# Patient Record
Sex: Female | Born: 1935 | Race: White | Hispanic: No | Smoking: Never smoker
Health system: Southern US, Community
[De-identification: ages and names within clinical notes are randomized; demographics above are authoritative.]

## PROBLEM LIST (undated history)

## (undated) DIAGNOSIS — M792 Neuralgia and neuritis, unspecified: Secondary | ICD-10-CM

## (undated) DIAGNOSIS — J449 Chronic obstructive pulmonary disease, unspecified: Secondary | ICD-10-CM

## (undated) DIAGNOSIS — J45909 Unspecified asthma, uncomplicated: Secondary | ICD-10-CM

## (undated) DIAGNOSIS — I10 Essential (primary) hypertension: Secondary | ICD-10-CM

## (undated) DIAGNOSIS — B029 Zoster without complications: Secondary | ICD-10-CM

## (undated) HISTORY — PX: LEG SURGERY: SHX1003

---

## 2001-09-16 ENCOUNTER — Encounter: Admission: RE | Admit: 2001-09-16 | Discharge: 2001-09-16 | Payer: Self-pay | Admitting: Gastroenterology

## 2001-09-16 ENCOUNTER — Encounter: Payer: Self-pay | Admitting: Gastroenterology

## 2002-08-09 ENCOUNTER — Encounter: Payer: Self-pay | Admitting: Gastroenterology

## 2002-08-09 ENCOUNTER — Ambulatory Visit (HOSPITAL_COMMUNITY): Admission: RE | Admit: 2002-08-09 | Discharge: 2002-08-09 | Payer: Self-pay | Admitting: Gastroenterology

## 2002-11-29 ENCOUNTER — Encounter: Admission: RE | Admit: 2002-11-29 | Discharge: 2002-11-29 | Payer: Self-pay | Admitting: Gastroenterology

## 2002-11-29 ENCOUNTER — Encounter: Payer: Self-pay | Admitting: Gastroenterology

## 2002-12-01 ENCOUNTER — Encounter: Payer: Self-pay | Admitting: Gastroenterology

## 2002-12-01 ENCOUNTER — Encounter: Admission: RE | Admit: 2002-12-01 | Discharge: 2002-12-01 | Payer: Self-pay | Admitting: Gastroenterology

## 2005-08-04 ENCOUNTER — Inpatient Hospital Stay: Payer: Self-pay | Admitting: Internal Medicine

## 2006-12-16 ENCOUNTER — Inpatient Hospital Stay: Payer: Self-pay | Admitting: Specialist

## 2006-12-16 ENCOUNTER — Other Ambulatory Visit: Payer: Self-pay

## 2006-12-17 ENCOUNTER — Other Ambulatory Visit: Payer: Self-pay

## 2008-04-03 ENCOUNTER — Emergency Department (HOSPITAL_COMMUNITY): Admission: EM | Admit: 2008-04-03 | Discharge: 2008-04-03 | Payer: Self-pay | Admitting: Emergency Medicine

## 2008-04-15 ENCOUNTER — Inpatient Hospital Stay (HOSPITAL_COMMUNITY): Admission: EM | Admit: 2008-04-15 | Discharge: 2008-04-21 | Payer: Self-pay | Admitting: Emergency Medicine

## 2008-04-15 ENCOUNTER — Ambulatory Visit: Payer: Self-pay | Admitting: Internal Medicine

## 2008-04-26 ENCOUNTER — Ambulatory Visit: Payer: Self-pay | Admitting: *Deleted

## 2008-04-26 ENCOUNTER — Encounter: Payer: Self-pay | Admitting: Internal Medicine

## 2008-04-26 ENCOUNTER — Encounter (INDEPENDENT_AMBULATORY_CARE_PROVIDER_SITE_OTHER): Payer: Self-pay | Admitting: *Deleted

## 2008-04-26 DIAGNOSIS — B029 Zoster without complications: Secondary | ICD-10-CM | POA: Insufficient documentation

## 2008-04-26 DIAGNOSIS — K5900 Constipation, unspecified: Secondary | ICD-10-CM | POA: Insufficient documentation

## 2008-04-26 DIAGNOSIS — R609 Edema, unspecified: Secondary | ICD-10-CM | POA: Insufficient documentation

## 2008-04-26 DIAGNOSIS — E538 Deficiency of other specified B group vitamins: Secondary | ICD-10-CM

## 2008-04-26 LAB — CONVERTED CEMR LAB
ALT: 17 units/L (ref 0–35)
AST: 22 units/L (ref 0–37)
Albumin: 3.8 g/dL (ref 3.5–5.2)
Basophils Absolute: 0 10*3/uL (ref 0.0–0.1)
Basophils Relative: 1 % (ref 0–1)
CO2: 28 meq/L (ref 19–32)
Calcium: 9.3 mg/dL (ref 8.4–10.5)
Chloride: 102 meq/L (ref 96–112)
Creatinine, Urine: 102.2 mg/dL
Hemoglobin: 12.1 g/dL (ref 12.0–15.0)
Lymphocytes Relative: 34 % (ref 12–46)
MCHC: 34.4 g/dL (ref 30.0–36.0)
Microalb Creat Ratio: 6.4 mg/g (ref 0.0–30.0)
Monocytes Absolute: 0.4 10*3/uL (ref 0.1–1.0)
Neutro Abs: 1.6 10*3/uL — ABNORMAL LOW (ref 1.7–7.7)
Neutrophils Relative %: 47 % (ref 43–77)
Potassium: 4.2 meq/L (ref 3.5–5.3)
RDW: 17.9 % — ABNORMAL HIGH (ref 11.5–15.5)
Sodium: 135 meq/L (ref 135–145)
Total Protein: 6.6 g/dL (ref 6.0–8.3)

## 2008-04-27 LAB — CONVERTED CEMR LAB: Vitamin B-12: 371 pg/mL (ref 211–911)

## 2008-04-28 ENCOUNTER — Encounter (INDEPENDENT_AMBULATORY_CARE_PROVIDER_SITE_OTHER): Payer: Self-pay | Admitting: *Deleted

## 2008-04-28 ENCOUNTER — Telehealth (INDEPENDENT_AMBULATORY_CARE_PROVIDER_SITE_OTHER): Payer: Self-pay | Admitting: Internal Medicine

## 2008-05-03 ENCOUNTER — Encounter (INDEPENDENT_AMBULATORY_CARE_PROVIDER_SITE_OTHER): Payer: Self-pay | Admitting: Internal Medicine

## 2008-05-03 ENCOUNTER — Ambulatory Visit (HOSPITAL_COMMUNITY): Admission: RE | Admit: 2008-05-03 | Discharge: 2008-05-03 | Payer: Self-pay | Admitting: Internal Medicine

## 2008-05-09 ENCOUNTER — Telehealth (INDEPENDENT_AMBULATORY_CARE_PROVIDER_SITE_OTHER): Payer: Self-pay | Admitting: Internal Medicine

## 2008-05-09 ENCOUNTER — Ambulatory Visit: Payer: Self-pay | Admitting: Internal Medicine

## 2008-05-10 ENCOUNTER — Encounter (INDEPENDENT_AMBULATORY_CARE_PROVIDER_SITE_OTHER): Payer: Self-pay | Admitting: *Deleted

## 2008-05-12 ENCOUNTER — Ambulatory Visit: Payer: Self-pay | Admitting: Internal Medicine

## 2008-05-12 ENCOUNTER — Encounter (INDEPENDENT_AMBULATORY_CARE_PROVIDER_SITE_OTHER): Payer: Self-pay | Admitting: Internal Medicine

## 2008-05-12 DIAGNOSIS — B0229 Other postherpetic nervous system involvement: Secondary | ICD-10-CM

## 2008-05-12 DIAGNOSIS — R030 Elevated blood-pressure reading, without diagnosis of hypertension: Secondary | ICD-10-CM | POA: Insufficient documentation

## 2008-05-12 LAB — CONVERTED CEMR LAB
Basophils Absolute: 0 10*3/uL (ref 0.0–0.1)
Basophils Relative: 0 % (ref 0–1)
Eosinophils Relative: 6 % — ABNORMAL HIGH (ref 0–5)
HCT: 37.3 % (ref 36.0–46.0)
LDH: 170 units/L (ref 94–250)
Lymphocytes Relative: 23 % (ref 12–46)
Platelets: 176 10*3/uL (ref 150–400)
RDW: 16.4 % — ABNORMAL HIGH (ref 11.5–15.5)

## 2008-05-13 ENCOUNTER — Encounter (INDEPENDENT_AMBULATORY_CARE_PROVIDER_SITE_OTHER): Payer: Self-pay | Admitting: Internal Medicine

## 2008-05-16 ENCOUNTER — Inpatient Hospital Stay (HOSPITAL_COMMUNITY): Admission: EM | Admit: 2008-05-16 | Discharge: 2008-05-23 | Payer: Self-pay | Admitting: Emergency Medicine

## 2008-05-25 ENCOUNTER — Telehealth (INDEPENDENT_AMBULATORY_CARE_PROVIDER_SITE_OTHER): Payer: Self-pay | Admitting: Internal Medicine

## 2008-05-30 ENCOUNTER — Telehealth (INDEPENDENT_AMBULATORY_CARE_PROVIDER_SITE_OTHER): Payer: Self-pay | Admitting: Internal Medicine

## 2008-06-07 ENCOUNTER — Ambulatory Visit: Payer: Self-pay | Admitting: Internal Medicine

## 2009-03-31 IMAGING — CR DG CHEST 2V
2 series · 2 of 2 positions shown · non-contrast
Comparison: None

CLINICAL DATA: Chest pain.  Nausea.

CHEST - 2 VIEW

[w chest lat]
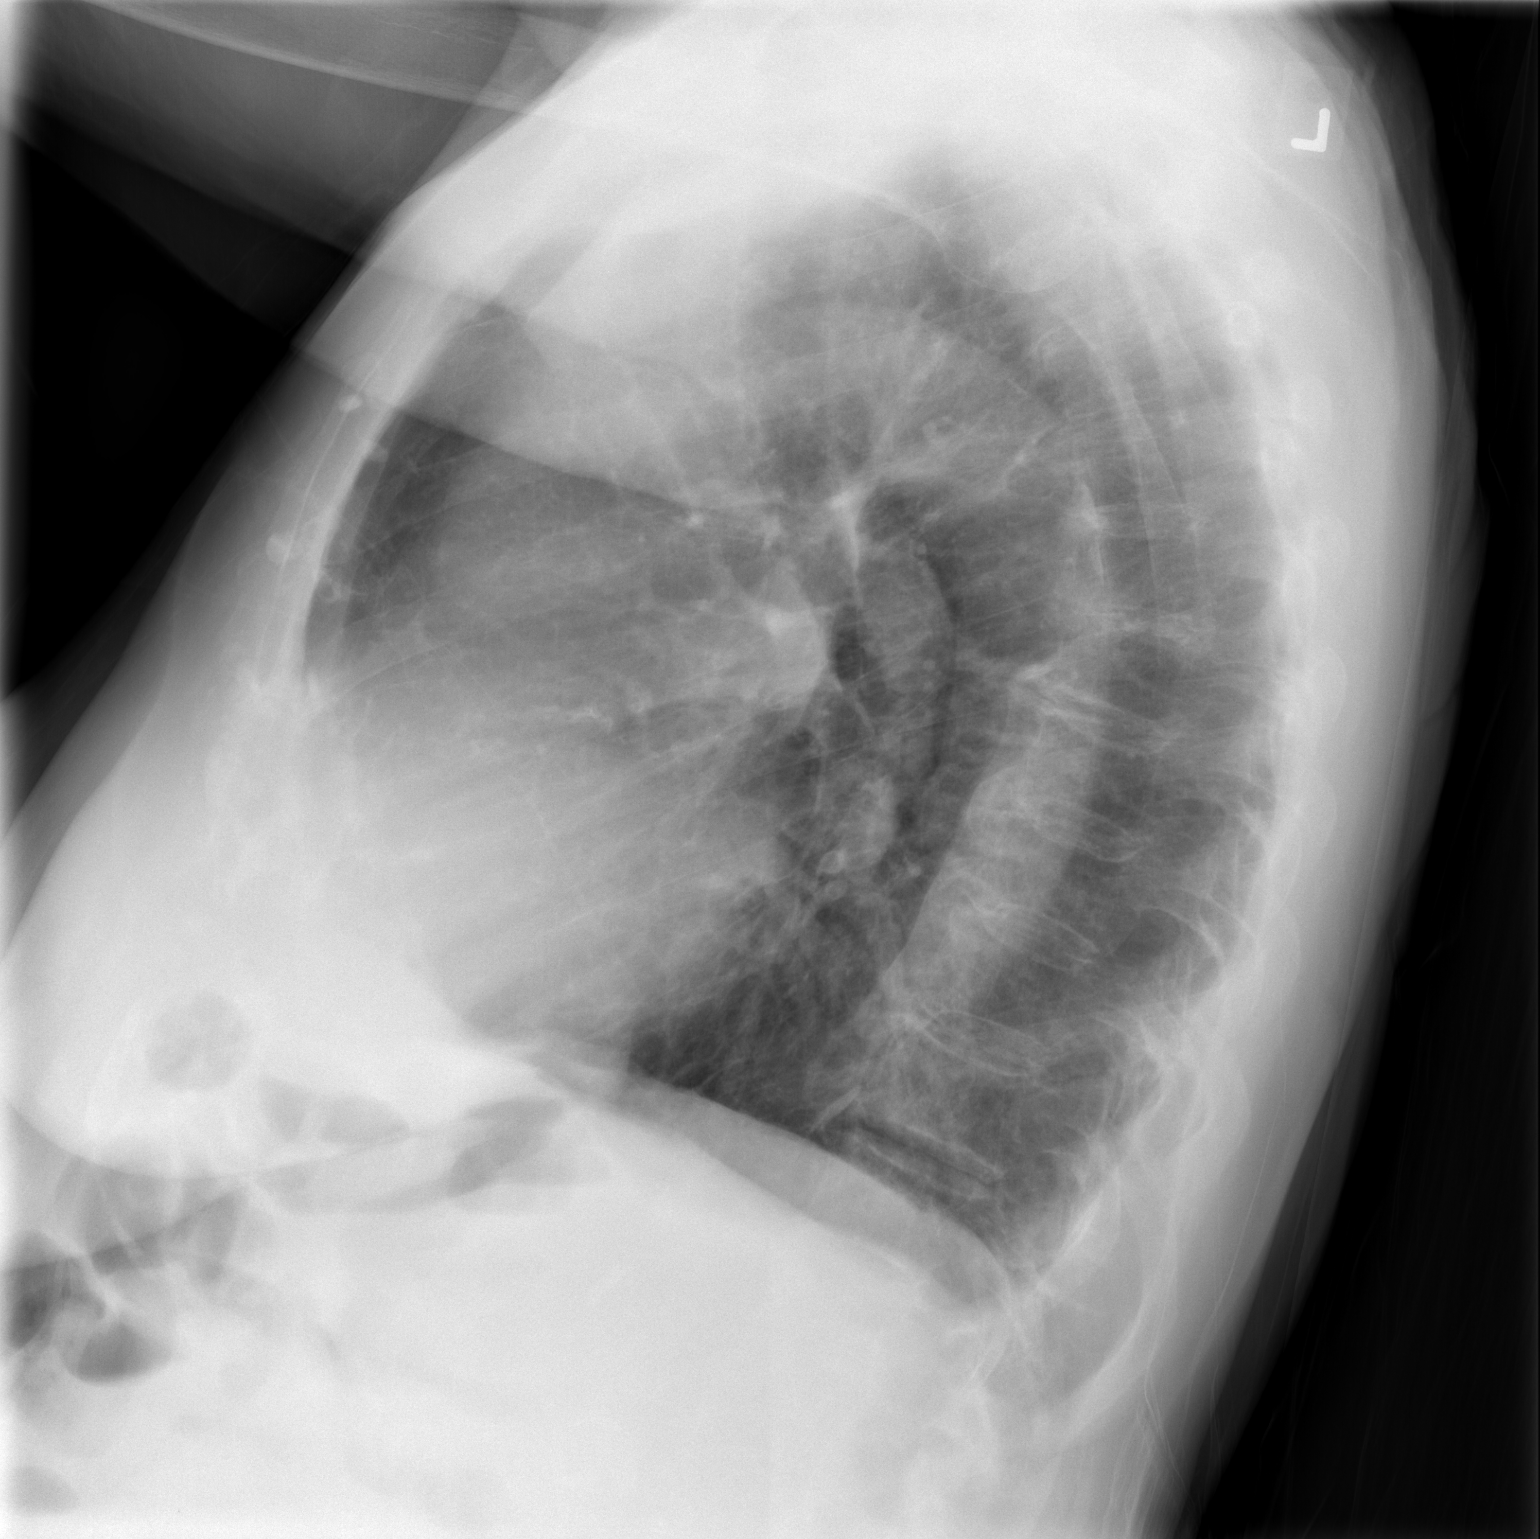

[view not recorded]
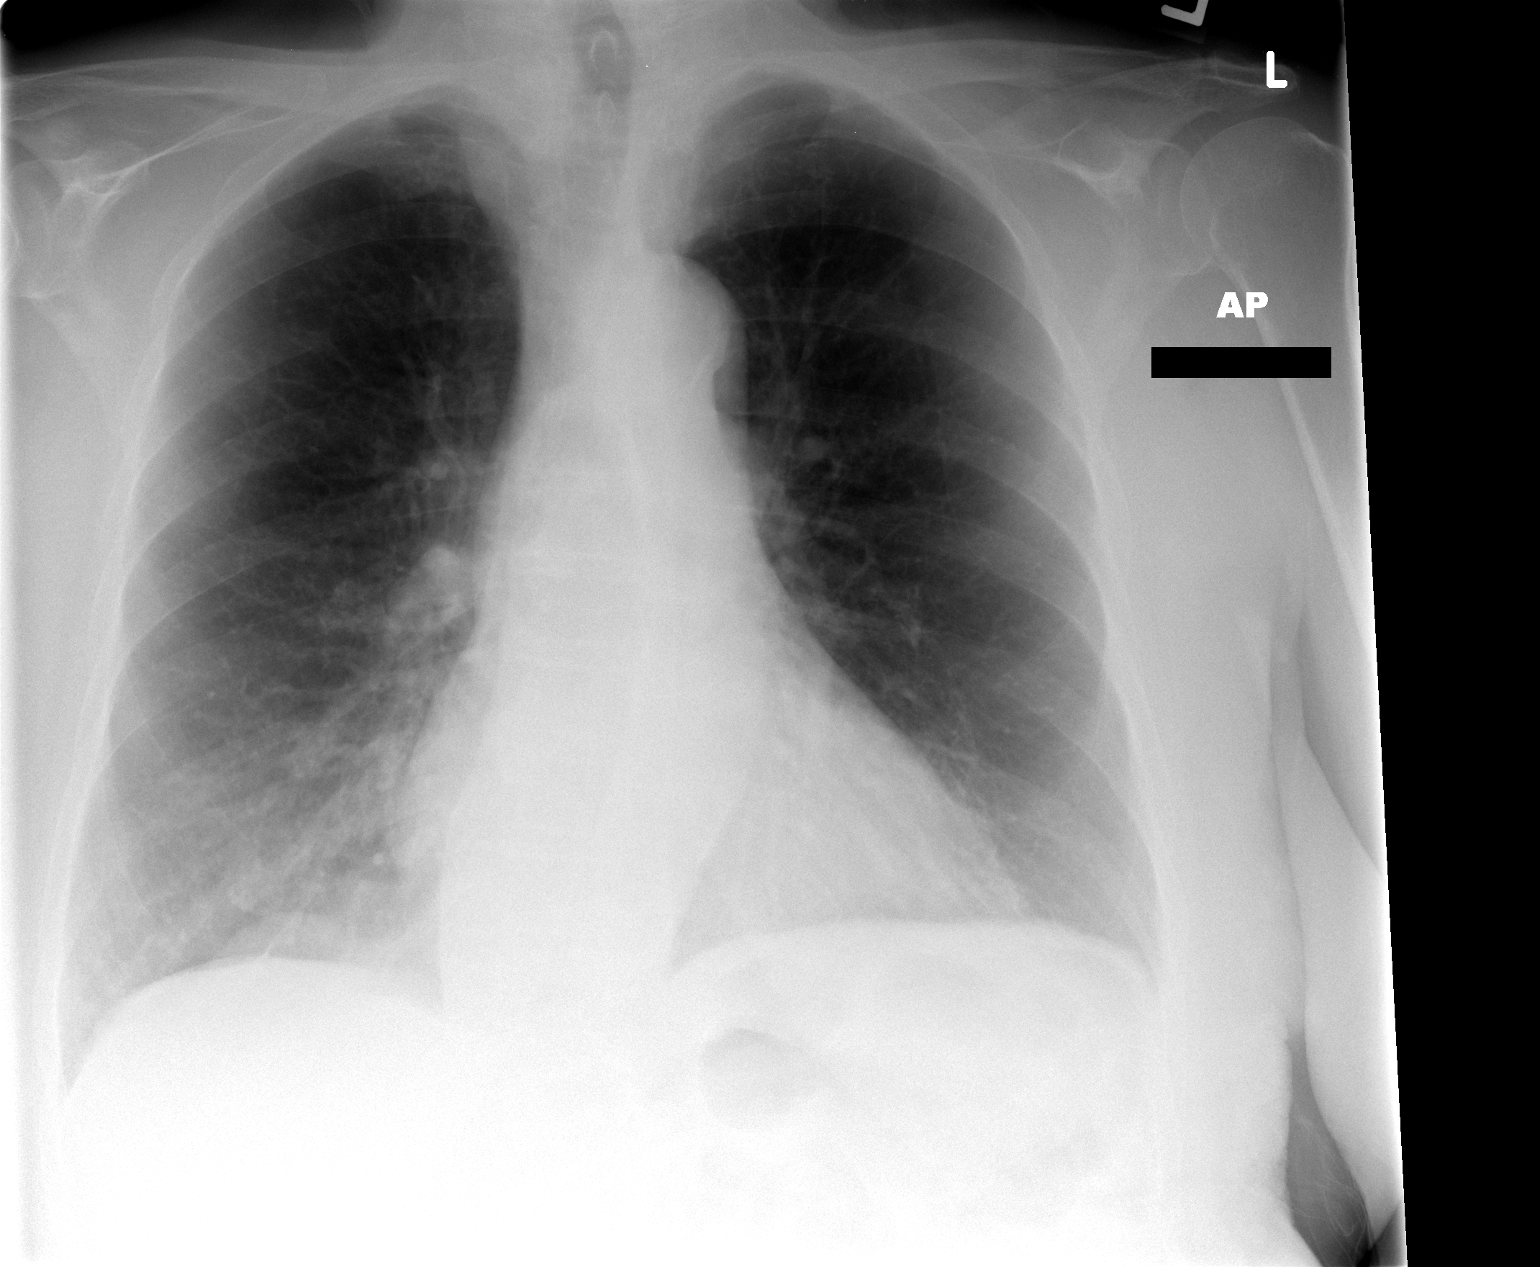

[2 of 2 positions shown; findings below may reference images not displayed]

FINDINGS: Heart size is normal.  The mediastinum is unremarkable
except for scoliosis.  No sign of infiltrate, mass, effusion or
collapse.  Bony structures show no acute lesion.
IMPRESSION: No active disease.  Scoliosis

## 2009-12-22 ENCOUNTER — Inpatient Hospital Stay: Payer: Self-pay | Admitting: Specialist

## 2009-12-26 ENCOUNTER — Encounter: Payer: Self-pay | Admitting: Internal Medicine

## 2010-01-07 ENCOUNTER — Encounter: Payer: Self-pay | Admitting: Internal Medicine

## 2010-02-03 ENCOUNTER — Inpatient Hospital Stay: Payer: Self-pay | Admitting: Internal Medicine

## 2010-06-04 ENCOUNTER — Encounter: Admission: RE | Admit: 2010-06-04 | Discharge: 2010-06-04 | Payer: Self-pay | Admitting: Orthopedic Surgery

## 2010-06-28 ENCOUNTER — Ambulatory Visit (HOSPITAL_COMMUNITY): Admission: RE | Admit: 2010-06-28 | Discharge: 2010-06-30 | Payer: Self-pay | Admitting: Orthopedic Surgery

## 2010-09-30 ENCOUNTER — Encounter: Payer: Self-pay | Admitting: Orthopedic Surgery

## 2010-11-21 LAB — URINE CULTURE
Colony Count: 4000
Culture  Setup Time: 201110191442

## 2010-11-21 LAB — AFB CULTURE WITH SMEAR (NOT AT ARMC)

## 2010-11-21 LAB — APTT: aPTT: 28 seconds (ref 24–37)

## 2010-11-21 LAB — URINALYSIS, ROUTINE W REFLEX MICROSCOPIC
Glucose, UA: NEGATIVE mg/dL
Ketones, ur: NEGATIVE mg/dL
Protein, ur: NEGATIVE mg/dL

## 2010-11-21 LAB — BASIC METABOLIC PANEL
BUN: 6 mg/dL (ref 6–23)
Calcium: 9.3 mg/dL (ref 8.4–10.5)
Chloride: 101 mEq/L (ref 96–112)
Creatinine, Ser: 0.54 mg/dL (ref 0.4–1.2)
GFR calc Af Amer: >60 mL/min (ref >60)
GFR calc non Af Amer: >60 mL/min (ref >60)
Potassium: 4.1 mEq/L (ref 3.5–5.1)
Potassium: 4.2 mEq/L (ref 3.5–5.1)
Sodium: 134 mEq/L — ABNORMAL LOW (ref 135–145)

## 2010-11-21 LAB — CBC
HCT: 34.7 % — ABNORMAL LOW (ref 36.0–46.0)
HCT: 38.9 % (ref 36.0–46.0)
MCV: 90.1 fL (ref 78.0–100.0)
Platelets: 177 10*3/uL (ref 150–400)
RBC: 3.85 MIL/uL — ABNORMAL LOW (ref 3.87–5.11)
RDW: 12.8 % (ref 11.5–15.5)
WBC: 5.1 10*3/uL (ref 4.0–10.5)
WBC: 5.2 10*3/uL (ref 4.0–10.5)

## 2010-11-21 LAB — WOUND CULTURE: Culture: NO GROWTH

## 2010-11-21 LAB — ANAEROBIC CULTURE: Gram Stain: NONE SEEN

## 2010-11-21 LAB — SEDIMENTATION RATE: Sed Rate: 26 mm/hr — ABNORMAL HIGH (ref 0–22)

## 2010-11-21 LAB — PROTIME-INR: Prothrombin Time: 13.2 seconds (ref 11.6–15.2)

## 2010-11-21 LAB — SURGICAL PCR SCREEN: MRSA, PCR: NEGATIVE

## 2010-11-21 LAB — DIFFERENTIAL
Eosinophils Absolute: 0.3 10*3/uL (ref 0.0–0.7)
Lymphocytes Relative: 31 % (ref 12–46)
Lymphs Abs: 1.6 10*3/uL (ref 0.7–4.0)
Neutrophils Relative %: 53 % (ref 43–77)

## 2010-11-21 LAB — C-REACTIVE PROTEIN: CRP: 0.1 mg/dL — ABNORMAL LOW (ref <0.6)

## 2010-12-27 ENCOUNTER — Emergency Department: Payer: Self-pay | Admitting: Internal Medicine

## 2011-01-22 NOTE — H&P (Signed)
Janice Hoffman, Janice Hoffman               ACCOUNT NO.:  [id number]   MEDICAL RECORD NO.:  [id number]          PATIENT TYPE:  INP   LOCATION:  0115                         FACILITY:  [facility]   PHYSICIAN:  Beckey Rutter, MD  DATE OF BIRTH:  Jan 21, 1926   DATE OF ADMISSION:  05/16/2008  DATE OF DISCHARGE:                              HISTORY & PHYSICAL   PRIMARY CARE PHYSICIAN:  This patient is unassigned to [facility].   CHIEF COMPLAINT:  Nausea and vomiting, severe headache and right orbital  pain.   HISTORY OF PRESENT ILLNESS:  This is a an 77 year old very pleasant  Caucasian female with past medical history significant for right  preorbital shingles, constipation, B12 deficiency and bronchial asthma  who came in today because of severe right orbital pain associated with  nausea and vomiting.  The patient was diagnosed with shingles at the end  of July and since then she was taking medication for it, but recently  she noticed she has headaches very frequent and she also noticed that  the headache and the pain severity is increasing.  Last night she woke  up with severe headache and she started to become nauseous, and she  threw up several times.  The patient is unable to keep anything on her  stomach currently.  The orbital pain is now 10/10 and stabbing in  nature, continuous and as mentioned, progressive.  No significant  radiation.  No fever or cough are mentioned.   PAST MEDICAL HISTORY:  1. Right preorbital shingles.  2. Constipation.  3. B12 deficiency.  4. Bronchial asthma.   SOCIAL HISTORY:  The patient lives at home.  She has recently lost her  husband.  Denied significant pain or prolonged hospitalization prior to  her shingles.  She is accompanied now with her son.  No significant  tobacco abuse, ethanol abuse or drug abuse.   FAMILY HISTORY:  Noncontributory.   MEDICATION ALLERGIES:  1. PENICILLIN.  2. PHENERGAN.   Note above documented medication includes:  1.  Advair Diskus.  2. Prednisone.  3. Valtrex.  4. Ventolin.  5. Reglan.  6. Morphine 15 mg twice daily.  7. MS Contin.  8. Protonix 40.  9. Morphine IR 7.5 mg.   REVIEW OF SYSTEMS:  All point review of systems is otherwise  unremarkable.  The rest per HPI.   EXAMINATION:  Temperature 97.1.  Blood pressure 179/89, pulse 72,  respiratory rate is 18.  HEAD:  Atraumatic, normocephalic.  EYES: Right eye is showing a slight sluggish reactive reaction to light.  Left eye is reactive to light.  Mouth moist.  No ulcer.  NECK:  Supple.  No JVD.  LUNGS:  Bilateral equal air entry.  PRECORDIUM EXAMINATION:  First and second heart sounds audible.  No  added sounds.  ABDOMEN:  Soft, nontender.  Bowel sounds present.  EXTREMITIES: No lower extremity edema.  NEUROLOGICALLY:  She is alert, oriented x3, moving all her extremities  spontaneously.  She is holding her right eyeball with pain and the right  temporal area of her scalp is very sensitive to pain.  The patient could  not take even a simple touch on that area.   LABORATORIES AND X-RAY:  KUB done today showing nonobstructive bowel gas  pattern.  No active cardiopulmonary disease.  On the chest x-ray, there  is no active disease with scoliosis.  EKG is showing sinus rhythm with  frequent premature ventricular complexes, minimal voltage criteria for  LVH.  May be normal variant, borderline.  The magnesium level is 2,  troponin is less than 0.05, CK-MB is 2.4, myoglobin is 51.4.  BNP is  131, lipase is 24.  Sodium is 135, potassium 3.3, chloride 100, bicarb  is 27.  Glucose 113, BUN 5, creatinine 0.50 .  PTT is 30.  PT is 13.4,  INR is 1.0.  White blood count is 3.8, hemoglobin is 12.1, hematocrit is  35.5, platelet count is 151.  Urinalysis is showing negative nitrate and  negative leukocyte esterase.   ASSESSMENT AND PLAN:  This is an 77 year old with intractable pain  secondary to post herpetic neuralgia.  I suspect abdominal symptoms,   i.e. the vomiting is secondary to the intractable pain together with the  use of narcotic.   PLAN:  1. The patient will be admitted for further assessment and management.  2. The patient will be started on Lyrica for postherpetic neuralgia.      I will consider giving antiemetic to help with the nausea.  3. I am not sure if the patient had the headache secondary to the      excessive use of analgesic and narcotic or is just secondary to the      postherpetic neuralgia, which is quite problematic and it is      already causing severe pain with a patient of her age.  I will hold      the oral medication and consider intravenous Dilaudid if the pain      is not controlled by Lyrica.  The patient will receive antiemetic      and proton pump inhibitor intravenously.  4. For constipation, we will continue the patient on Senokot-S.  5. For B12 deficiency we will continue injectable according to      schedule prior to her presentation.  6. Bronchial asthma, stable.  Continue Advair.  7. Gastrointestinal prophylaxis as discussed above.  8. Deep vein thrombosis prophylaxis.  We will start Lovenox.      Beckey Rutter, MD  Electronically Signed     EME/MEDQ  D:  05/16/2008  T:  05/16/2008  Job:  [id number]

## 2011-01-22 NOTE — Discharge Summary (Signed)
Janice Hoffman, Janice Hoffman               ACCOUNT NO.:  [id number]   MEDICAL RECORD NO.:  [id number]          PATIENT TYPE:  INP   LOCATION:  5156                         FACILITY:  [facility]   PHYSICIAN:  Ileana Roup, M.D.  DATE OF BIRTH:  [birth year]   DATE OF ADMISSION:  04/15/2008  DATE OF DISCHARGE:  04/21/2008                               DISCHARGE SUMMARY   DISCHARGE DIAGNOSES:  1. Periorbital shingles with neuropathic pain.  2. Acute diagnosis, constipation and B12 deficiency.  3. Chronic diagnosis, asthma and arthritis.   DISCHARGE MEDICATIONS:  1. Valacyclovir 1000 mg t.i.d. x7 days.  2. MS Contin 15 mg p.o. b.i.d.  3. MS IR 7.5 mg p.o. p.r.n. pain.  4. Protonix 40 mg daily twice a day.  5. Sorbitol 30 mg p.o. b.i.d.  6. Neurontin 600 mg p.o. t.i.d.  7. MiraLax 17 g p.o. daily.  8. Reglan 10 mg p.o. q.a.c. and at bedtime.  9. Advair Diskus 250/50 b.i.d.  10.Colace 100 mg p.o. b.i.d.   CONDITION AT DISCHARGE:  Stable.   The patient to follow up with Dr. Oren Bracket, Ophthalmology, on April 22, 2008, at 2:30 p.m.  The patient is also to follow up with Dr. Sunnie Nielsen  in the Outpatient Clinic on April 26, 2008, at 9 a.m.  The patient is  also to follow up with [facility], Dr. Randa Evens, at 1 p.m. on April 28, 2008.  The patient should have a BMET and CBC at the Outpatient Clinic  followup.  The patient should also have a B12 level drawn at the  Outpatient Clinic followup.   PROCEDURES:  The patient had an abdominal x-ray on April 15, 2008, that  showed no evidence of bowel obstruction or free intraperitoneal air.  Possible constipation.  The patient had a followup abdominal x-ray on  April 19, 2008, that showed normal stool and bowel gas pattern with no  evidence of gross obstruction or pneumoperitoneum.   CONSULTATIONS:  There were no consultations during this hospital  admission.   ADMISSION HISTORY AND PHYSICAL:  The patient is an 77 year old woman  with history of  asthma and chronic constipation with an average of 1  bowel movement per week, who presents with 2-week history of shingles  infection around her right eye.  She saw an ENT physician, Dr. Andee Poles, 2  weeks ago, who prescribed her Valtrex, prednisone taper, and Vicodin.  She has experienced a stabbing pain from her right ear to her right eye  with episodes approximately every hour that last for approximately 1  minute.  She has had painful vesicles, but they have drained and now  only erythema and residual severe pain remains.  She has also seen an  ophthalmologist, Dr. Oren Bracket 3 times, who has stated that there is no  ocular involvement at present.  She was prescribed Omnipred 1% eyedrops  to be administered 4 times a day to affected eye.  She has also  experienced mild blurriness of her vision in her right eye.  She also  reports not having had any bowel movements over the past 2 weeks  and  takes milk of magnesia, [employer] stool softeners, and Fleet enema, but still  has not had a bowel movement on this regimen.   ADMISSION PHYSICAL EXAM:  VITAL SIGNS:  The patient's vital signs are  temperature 98.2, blood pressure of 173/87, pulse of 74, respiratory  rate of 20, oxygen saturation 95% on room air.  GENERAL PHYSICAL APPEARANCE:  Anxious and tearful.  EYES:  Right eye injection, no drainage.  Extraocular muscles intact.  Pupils equal, round, reactive to light.  ENT:  Scattered erythema around her right eye with greatest involvement  above the eye.  No vesicles and no involvement of right preoral area.  The erythema does not cross the midline.  The patient has moist  membranous mucosa.  RESPIRATORY:  Clear to auscultation bilaterally with good air movement.  CARDIOVASCULAR:  Diminished heart sounds, regular rate and rhythm.  No  murmurs, rubs, or gallops.  GI:  Soft, nontender, and nondistended with no masses and decreased  bowel sounds.  EXTREMITIES:  No clubbing, no cyanosis, no edema.   MUSCULOSKELETAL:  Notable for a left painful foot nodule on her second  and third toes.  NEURO:  Alert and oriented x3 with cranial nerves 2 through 12 intact  and grossly nonfocal.  PSYCH:  The patient was in mild distress and had mildly pressured  speech.   ADMISSION LABS:  A CBC with a white count of 3.8 and ANC of 2.0, a  hemoglobin of 12.5 and MCV of 90, hematocrit of 36.9, platelets of 179.  Her BMET on admission was 137 sodium, a potassium of 4.0, a chloride of  102, a bicarbonate of 28, a BUN of 6, a creatinine of 0.61, and a  glucose of 98.   HOSPITAL COURSE:  1. Right periorbital shingles:  The patient had a shingles infection      for approximately 2 weeks prior to admission and had been seen on      an outpatient basis by the ENT physician, Dr. Andee Poles, and      ophthalmologist, Dr. Oren Bracket, and had significant pain despite      treatment with Valtrex, a prednisone taper, and Vicodin p.o.  For      this reason, she came to the emergency department on April 15, 2008.  She was initially started on valacyclovir 1000 mg p.o.      t.i.d.  She was started on morphine 1-2 mg IV q.4 h. as needed for      pain.  On the second day of admission, the patient was started on      Neurontin 300 mg p.o. t.i.d.  She endure less sharp neuropathic      pain with initiation of the Neurontin.  She was increased to 600 mg      Neurontin t.i.d. on April 18, 2008, secondary to being able to      tolerate the lower dose and still having residual neuropathic pain.      She was converted to p.o. pain medications on April 20, 2008.  She      started on MS Contin 50 mg b.i.d. with MSIR 7.5 mg q.6 h. p.r.n.      for breakthrough pain.  She continued to have some pain on this      regimen, but it had significantly improved from admission.  She had      much less frequent sharp, shooting neuropathic pain on 600 mg  Neurontin.  We have discussed with her at length that the shingle      infection  may cause continued pain for weeks or even longer and she      understands that her pain may not be cleared out completely for a      while.  She will continue on the MS Contin and MSIR as an      outpatient.  Her shingles improved during the course of admission      and that her periorbital erythema decreased and no active vesicles      ever appeared.  2. Constipation/obstipation:  The patient presented with not having      had a bowel movement for 2 weeks prior to admission.  She has a      baseline of only having a bowel movement approximately once a week.      Her outpatient regimen of Vicodin for pain relief secondary to her      shingles seemed to make her more constipated secondary to narcotic      bowel.  She was started on MiraLax, Colace, and Reglan upon      admission.  She did not have a bowel movement for the first couple      of days.  She was started on a sorbitol enema which produced a      significant bowel movement on the night of April 17, 2008.  She was      then started on sorbitol 30 mg p.o. b.i.d., but did not have a      bowel movement for the next couple of days.  She was again      administered a sorbitol enema on April 20, 2008, and had a small      bowel movement.  On both of her abdominal x-rays, there was no      evidence of a small bowel obstruction or ileus.  There was possible      constipation seen on the admission abdominal x-ray.  We are sending      her out on a bowel regimen including Colace, MiraLax, Reglan, and      sorbitol p.o.  She is to follow up with Dr. Randa Evens of [facility] on      April 28, 2008, for further workup and treatment of her      constipation and obstipation.  She may need a colonoscopy as an      outpatient.  3. B12 deficiency:  During the workup of her obstipation, it was found      that she had a B12 level of 150.  She was started on daily IM      injections of B12 1000 mg and a repeat B12 level on April 17, 2008,      showed  a level of 1615.  She was continued on daily IM injections      of B12 through the rest of her admission.  She will need to be      followed up for the B12 deficiency as an outpatient.  She had      persistent unchanged anemia with hemoglobins ranging from 10-11 for      the duration of her admission.  We did an anemia panel which showed      an iron of 51, a total iron binding capacity of 346, a ferritin      level of 23, a percent saturation of 15, and a serum folate of 23.  4. Asthma:  She has a history of asthma and was taking Advair b.i.d.      250/50 as an outpatient.  She was started on the same regimen as an      inpatient and did not report any wheezing for her entire stay.   DISCHARGE LABS AND VITALS:  Her last set of vitals on April 21, 2008,  were a temperature of 97.8, a pulse of 66, a respiration rate of 18,  blood pressure of 135/66, and O2 saturation 96% on room air.  Her last  CBC showed a white blood cell count of 3.5, hemoglobin of 11.2,  hematocrit of 33.0, MCV of 92, RDW of 16.1, platelet count of 139.  Her  last BMET had a sodium of 136, potassium of 3.9, and chloride of 101,  and bicarbonate of 29, and glucose of 90, a BUN of 7, and a creatinine  of 0.67.  We also had a hemoglobin A1c level on April 15, 2008, of 5.6,  a total cholesterol of 193, triglyceride level of 111, LDL level of 123,  and a TSH of 0.558.   PENDING LABS:  There are no pending labs at this time.      Linward Foster, MD  Electronically Signed      Ileana Roup, M.D.  Electronically Signed    LW/MEDQ  D:  04/21/2008  T:  04/22/2008  Job:  [id number]   cc:   Outpatient Clinic  [facility]  Dr. Randa Evens

## 2011-01-22 NOTE — Discharge Summary (Signed)
NAMESYLIVA, MEE               ACCOUNT NO.:  [id number]   MEDICAL RECORD NO.:  [id number]          PATIENT TYPE:  INP   LOCATION:  1517                         FACILITY:  [facility]   PHYSICIAN:  Marcellus Scott, MD     DATE OF BIRTH:  Aug 20, 1926   DATE OF ADMISSION:  05/16/2008  DATE OF DISCHARGE:  05/23/2008                               DISCHARGE SUMMARY   PRIMARY MEDICAL DOCTOR:  Janice Hoffman at the [facility].   DISCHARGE DIAGNOSES:  1. Post-herpetic neuralgia involving the trigeminal divisions 1 and 2      and upper cervical region C2.  2. Headache secondary to problem #1.  3. Constipation.  4. ? chronic thrombocytopenia.  5. Anemia.  6. Asthma.  7. B12 deficiency.  8. Nausea and vomiting - ? acute gastroenteritis versus side effect of      narcotic pain medications.  9. Leukopenia - resolved.  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   DISCHARGE MEDICATIONS:  1. Gabapentin 600 mg p.o. t.i.d.  2. Protonix 40 mg p.o. b.i.d.  3. Advair Diskus 250/50 one dose inhaled 2 times daily.  4. Ventolin 90 mcg per spray MDI 2 puffs inhaled q.4 to 6 hours p.r.n.  5. Miralax 17 g in 8 ounces of liquid p.o. daily.  6. Sorbitol 30 mL p.o. b.i.d.  7. Senokot S 2 tablets p.o. b.i.d.  8. Nortriptyline 25 mg p.o. nightly.  9. Dulcolax suppositories 10 mg per rectum p.r.n.  10.Oxycodone IR 5 mg p.o. q.4 to 6 hours p.r.n. 30 tablets dispensed.   DISCONTINUED MEDICATIONS:  1. Prednisone.  The patient was not taking this medication, however.  2. Valtrex.  Probably, the patient completed this prior to this      admission.  3. Reglan.  4. MS-Contin and MSIR.   PROCEDURES:  1. September 7 x-ray of the abdomen.  Impression:  Nonobstructive      bowel gas pattern.  2. Chest x-ray, no active cardiopulmonary disease.  3. Chest x-ray on September 7.  Impression:  No active disease.      Scoliosis.   PERTINENT LABS:  CBC on September 12 with  hemoglobin 11.6, hematocrit  34.5, white blood cells 4, platelets 149,000.  Free T3 2.4, free T4  1.28.  Repeated TSH 0.933.  ESR 11.  BNP 131.  Point of care cardiac  markers, negative.  Lipase 24.  Urinalysis not indicative of urinary  tract infection.   CONSULTATIONS:  Neurology, Janice Hoffman.   HOSPITAL COURSE AND PATIENT DISPOSITION:  Please refer to the history  and physical note for initial admission details.  In summary, Janice Hoffman  is a very pleasant 77 year old Caucasian female patient with history of  bronchial asthma, constipation, B12 deficiency, who had varicella zoster  infection involving the trigeminal nerve first and second division and  upper cervical region at the end of July when she was seen by ENT  physician and treated with Valtrex.  She was subsequently seen by  ophthalmologist, Janice Hoffman, who indicated that she did not have any  ophthalmic involvement.  Since then, the patient has had intractable  pain on the right side of the scalp.  She had been back and forth  between [facility] and [facility] secondary to these pains.  She was, therefore, admitted to the hospital for further evaluation and  management.  1. Post-herpetic neuralgia.  The patient was continued on her      Neurontin 600 mg p.o. t.i.d.  She was on p.r.n. narcotics      initially.  Lyrica was initially started, but given that she was      already on therapeutic doses of Neurontin, it was promptly      discontinued.  MS-Contin was added and then a low dose of      nortriptyline at night was started at 10 mg nightly.  With these      measures, the patient continued to have excruciating right-sided      scalp and right upper face pain above the angle of the mouth.      Neurology was consulted.  Please see Janice Hoffman note for      details.  He recommended continuing the Neurontin and gradually up-      titrating the nortriptyline every 4 to 7 days for a total dose of      75 to  100 mg per day.  If this failed, other options included      Cymbalta or carbamazepine.  He did indicate that we needed to be      patient to avoid excessive sleepiness in titrating her      nortriptyline dose.  As the MS-Contin was not making any      significant contribution to her pain, and also the side effects of      constipation, it was discontinued with no significant difference in      pain.  With these measures, the patient's pain has gradually      decreased over the last week.  It is now 4/10 to 5/10.  Initially,      she was unable to touch her scalp, but today she is able to comb      her hair, and allows examination of the scalp, which does not have      any active lesions.  She is discharged on the above medications and      an appointment has been made for followup with her primary medical      doctor for September 29 at 10:40 a.m.  2. Headache secondary to problem #1 and management as above.  3. Constipation, which has been an ongoing problem during this      hospitalization.  Her narcotics were reduced and bowel regimen was      started.  With these measures, the patient has had a good BM today.  4. Thrombocytopenia, which may be chronic in nature.  She had dropped      down to 109, but has come back to 149.  This can be monitored      periodically as an outpatient.  This may be a part of her B12      deficiency, levels of which were not checked this admission and can      be followed as an outpatient.  5. Anemia, which has remained stable.  6. Asthma, stable on current medications.  7. Nausea and vomiting on admission, which might have been acute      gastroenteritis versus side effects of narcotics.  This has  promptly resolved and the patient is tolerating diet.   The patient at this time is ambulating, tolerating diet, had BM's, has  stable vital signs and physical examination, and is stable for discharge  to follow up with her primary medical doctor as an  outpatient.      Marcellus Scott, MD  Electronically Signed     AH/MEDQ  D:  05/23/2008  T:  05/23/2008  Job:  [id number]   cc:   Janice Hoffman. Janice Hoffman, M.D.  Fax: [phone]

## 2011-01-22 NOTE — Consult Note (Signed)
NAMEGLORI, Janice Hoffman               ACCOUNT NO.:  [id number]   MEDICAL RECORD NO.:  [id number]          PATIENT TYPE:  INP   LOCATION:  1517                         FACILITY:  [facility]   PHYSICIAN:  Deanna Artis. Hickling, M.D.DATE OF BIRTH:  1925/10/07   DATE OF CONSULTATION:  05/19/2008  DATE OF DISCHARGE:                                 CONSULTATION   CHIEF COMPLAINT:  Intractable right face pain and headache.   I was asked by Dr. 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  This began in the end of July.  She was seen by  ear, nose, and throat physician who made the diagnosis of zoster and  placed her on Valtrex and Vicodin.  The zoster also affected the  external auditory canal on the right side as well as the area around the  orbit.  She was seen by Dr. Lemar Lofty, an ophthalmologist, who  noted that she did not have the involvement of her orbit which is a good  thing, because when that occurs it can tract back and cause a  granulomata form around the carotid artery and create stroke.   The patient has had intractable pain since the end of July.  Has used  narcotic analgesics.  More recently she was placed on Neurontin, and has  taken this for the last month in increasing doses without much benefit.   She has been back and forth between [facility] and [facility]  on 2 or 3 occasions, and has been admitted for brief periods of time.  As best I know, this is the first time that neurology has seen her.  The  patient has been in the hospital now for 3 days.  Lyrica was started  with the notion to try to decrease the amount of narcotics that she was  using.  Unfortunately, she developed burning pain of her legs and face,  and Lyrica was wisely discontinued.   We were asked to see her to determine what other  medications might be  useful and appropriate to alleviate her pain.   It is my understanding that the only imaging that she has had involved  CT scan of the sinuses which showed increased fluid in the left sinuses  but not on the right.   The patient also has had laboratory studies carried out during this  hospitalization that show normal thyroid function, slightly elevated  fasting glucose at 148, normal liver functions and renal function, low  white blood cell count at 2500, no signs of anemia or thrombocytopenia,  sedimentation rate that was not elevated at 11, and no other  abnormalities within a wide variety of labs checked including  urinalysis.   The patient reports to me that over time, even in this hospitalization,  she feels that the region of pain has begun to shrink and now rather  than involving the cheek region basically is the periorbital region and  her  scalp.  She is having difficulty falling asleep.  She was admitted  with nausea and vomiting.  It is not clear whether this was a side  effect of the narcotics that she was taking to alleviate her pain or  whether she actually had a gastroenteritis.   She has been treated with IV fluids and is now not suffering from nausea  or vomiting or diarrhea.  She is not running any fever.   PAST MEDICAL HISTORY:  Remarkable for:  1. Constipation.  2. Vitamin B12 deficiency.  3. Asthma.   MEDICATIONS AT HOME:  1. Advair Diskus.  2. Prednisone.  3. Valtrex.  4. Ventolin.  5. Reglan.  6. Morphine both 15 mg and 7.5 mg.  7. MS Contin.  8. Protonix.   DRUG ALLERGIES:  PHENERGAN, PENICILLIN.  We can add intolerances to  [facility].   FAMILY HISTORY:  Noncontributory for this acquired problem.   SOCIAL HISTORY:  The patient lives at home.  She lost her husband in  March.  She does not use tobacco, alcohol or drugs.   PHYSICAL EXAMINATION:  Today this is a pleasant woman 77 years of age,  right-handed, in no acute  distress.  VITAL SIGNS:  Temperature 98.6, blood pressure 176/95, resting pulse 68,  respirations 24, oxygen saturation 97%.  EAR, NOSE, AND THROAT:  No infections, no rash.  Supple neck.  No bruits.  LUNGS:  Clear.  HEART:  No murmurs.  Pulses normal.  ABDOMEN:  Soft.  Bowel sounds normal.  No hepatosplenomegaly.  EXTREMITIES:  Unremarkable.  NEUROLOGICAL:  Awake, alert.  CRANIAL NERVES:  The patient has some spontaneous itching over the C2  region of her scalp.  She has tactile lancinating pain at V1 and C2.  She has hypesthetic, paresthetic pain to pinprick at V1, V2, and C2.  Pupils equal, round, and reactive.  Visual fields full, extraocular  movements full.  Symmetric facial strength, midline tongue, air  conduction greater than bone conduction bilaterally.  MOTOR:  Normal strength, tone, and mass.  Good fine motor movements.  No  pronator drift.  Sensation intact except as noted above.  She had good  stereognosis and proprioception.  CEREBELLAR:  Good finger-to-nose, rapid alternating movements.  Gait was  normal.  She could get up on her heels and toes, performing tandem  without difficulty.  Reflexes were absent.  The patient had bilateral  flexor plantar responses.   IMPRESSION:  1. 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  All of these have trigeminal projections.  2. Headache secondary to #1.  3. I suspect gastroenteritis, but I cannot rule out side effects of      narcotic analgesics.  4. Adverse reaction to Lyrica.   RECOMMENDATIONS:  1. Continue Neurontin 600 mg 3 times a day.  This can be increased to      a total of 3 g per day, but I do not know that Neurontin has done      very much for her.  I agree with the use of generic gabapentin.  2. I agree also with starting nortriptyline and steadily increasing      the dose.  We need to be patient because we do not want to get her      excessively sleepy, but this can  be pushed every 4-7 days up to a      total dose of 75-100 mg a day.  If  this fails, other options      include Cymbalta or carbamazepine.  Zostrix can be effective, but      it will cause intense burning pain while it depletes the nerve      terminals of substance P which is what is causing her pain.  It is      also going to be hard to apply to the scalp.  Finally, it is      understood by her admitting hospitalist physician we need to limit      the amount of narcotics that she takes.  She herself understands      that this is not causing much relief for her and that it is causing      a fair amount of side effects in terms of worsening her      constipation and causing upset stomach.  This will gradually settle      itself.  I think that probably another 6 weeks and things will ease      off.  I appreciate the opportunity to participate in her care.      Deanna Artis. Sharene Skeans, M.D.  Electronically Signed     WH[facility]/MEDQ  D:  05/19/2008  T:  05/20/2008  Job:  [id number]   cc:   Marcellus Scott, MD

## 2011-06-07 LAB — COMPREHENSIVE METABOLIC PANEL
ALT: 16
AST: 19
Albumin: 3.9
CO2: 28
Calcium: 9.1
Creatinine, Ser: 0.67
GFR calc Af Amer: >60
Sodium: 137
Total Protein: 6.7

## 2011-06-07 LAB — CBC
HCT: 30.8 — ABNORMAL LOW
HCT: 32 — ABNORMAL LOW
HCT: 33 — ABNORMAL LOW
HCT: 36.9
Hemoglobin: 10.7 — ABNORMAL LOW
Hemoglobin: 11.2 — ABNORMAL LOW
Hemoglobin: 12.5
MCHC: 33.9
MCHC: 34
MCHC: 34
MCHC: 34.7
MCV: 90
MCV: 90.3
MCV: 90.9
MCV: 91.6
Platelets: 145 — ABNORMAL LOW
Platelets: 150
Platelets: 179
RBC: 3.29 — ABNORMAL LOW
RBC: 3.42 — ABNORMAL LOW
RBC: 3.43 — ABNORMAL LOW
RBC: 3.52 — ABNORMAL LOW
RBC: 3.59 — ABNORMAL LOW
RDW: 15.2
RDW: 16.1 — ABNORMAL HIGH
RDW: 16.2 — ABNORMAL HIGH
WBC: 3.2 — ABNORMAL LOW
WBC: 3.7 — ABNORMAL LOW
WBC: 3.7 — ABNORMAL LOW
WBC: 3.8 — ABNORMAL LOW

## 2011-06-07 LAB — BASIC METABOLIC PANEL
BUN: 6
BUN: 6
BUN: 6
CO2: 27
CO2: 29
CO2: 29
Calcium: 8.5
Calcium: 8.9
Calcium: 8.9
Chloride: 102
Chloride: 105
Chloride: 105
Chloride: 106
Creatinine, Ser: 0.58
Creatinine, Ser: 0.67
Creatinine, Ser: 0.77
GFR calc Af Amer: >60
GFR calc Af Amer: >60
GFR calc Af Amer: >60
GFR calc Af Amer: >60
GFR calc Af Amer: >60
GFR calc non Af Amer: >60
GFR calc non Af Amer: >60
Glucose, Bld: 90
Glucose, Bld: 91
Glucose, Bld: 98
Potassium: 3.6
Potassium: 3.9
Potassium: 3.9
Potassium: 4
Sodium: 136
Sodium: 137
Sodium: 137
Sodium: 138

## 2011-06-07 LAB — DIFFERENTIAL
Eosinophils Absolute: 0.1
Eosinophils Relative: 4
Lymphs Abs: 1.2
Monocytes Absolute: 0.4

## 2011-06-07 LAB — TSH: TSH: 0.558

## 2011-06-07 LAB — IRON AND TIBC
Iron: 51
Saturation Ratios: 15 — ABNORMAL LOW
TIBC: 346

## 2011-06-07 LAB — LIPID PANEL
Cholesterol: 193
HDL: 48
Total CHOL/HDL Ratio: 4

## 2011-06-07 LAB — VITAMIN B12: Vitamin B-12: 150 — ABNORMAL LOW (ref 211–911)

## 2011-06-07 LAB — MAGNESIUM: Magnesium: 2.2

## 2011-06-07 LAB — FOLATE: Folate: >20

## 2011-06-07 LAB — FERRITIN: Ferritin: 23 (ref 10–291)

## 2011-06-12 LAB — TSH
TSH: 0.27 — ABNORMAL LOW
TSH: 0.933

## 2011-06-12 LAB — CBC
HCT: 35.5 — ABNORMAL LOW
Hemoglobin: 12.1
Hemoglobin: 12.6
MCHC: 33.7
MCV: 92.9
RBC: 3.72 — ABNORMAL LOW
RBC: 3.85 — ABNORMAL LOW
RBC: 4
RDW: 17.3 — ABNORMAL HIGH
WBC: 2.5 — ABNORMAL LOW
WBC: 4

## 2011-06-12 LAB — URINALYSIS, ROUTINE W REFLEX MICROSCOPIC
Glucose, UA: NEGATIVE
Hgb urine dipstick: NEGATIVE
pH: 7.5

## 2011-06-12 LAB — COMPREHENSIVE METABOLIC PANEL
ALT: 12
ALT: 15
AST: 15
Alkaline Phosphatase: 36 — ABNORMAL LOW
BUN: 5 — ABNORMAL LOW
CO2: 26
CO2: 27
Chloride: 100
Chloride: 103
Creatinine, Ser: 0.63
GFR calc Af Amer: >60
GFR calc non Af Amer: >60
GFR calc non Af Amer: >60
Glucose, Bld: 113 — ABNORMAL HIGH
Potassium: 3.3 — ABNORMAL LOW
Sodium: 135
Total Bilirubin: 0.6
Total Bilirubin: 0.9

## 2011-06-12 LAB — DIFFERENTIAL
Basophils Absolute: 0
Basophils Relative: 1
Eosinophils Absolute: 0.1
Monocytes Relative: 8
Neutrophils Relative %: 72

## 2011-06-12 LAB — MAGNESIUM: Magnesium: 2

## 2011-06-12 LAB — POCT CARDIAC MARKERS: CKMB, poc: 2.4

## 2011-06-12 LAB — T4, FREE: Free T4: 1.28

## 2011-06-12 LAB — PROTIME-INR: Prothrombin Time: 13.4

## 2011-06-12 LAB — LIPASE, BLOOD: Lipase: 24

## 2013-08-03 DIAGNOSIS — M25569 Pain in unspecified knee: Secondary | ICD-10-CM | POA: Insufficient documentation

## 2013-10-13 ENCOUNTER — Emergency Department: Payer: Self-pay | Admitting: Emergency Medicine

## 2014-05-24 DIAGNOSIS — M1732 Unilateral post-traumatic osteoarthritis, left knee: Secondary | ICD-10-CM | POA: Insufficient documentation

## 2014-06-06 ENCOUNTER — Other Ambulatory Visit: Payer: Self-pay | Admitting: Orthopedic Surgery

## 2014-06-06 DIAGNOSIS — M5416 Radiculopathy, lumbar region: Secondary | ICD-10-CM

## 2014-06-07 DIAGNOSIS — E559 Vitamin D deficiency, unspecified: Secondary | ICD-10-CM | POA: Insufficient documentation

## 2014-06-07 DIAGNOSIS — D510 Vitamin B12 deficiency anemia due to intrinsic factor deficiency: Secondary | ICD-10-CM | POA: Insufficient documentation

## 2014-06-14 ENCOUNTER — Ambulatory Visit
Admission: RE | Admit: 2014-06-14 | Discharge: 2014-06-14 | Disposition: A | Discharge status: Home / Self Care | Payer: Medicare Other | Source: Ambulatory Visit | Attending: Orthopedic Surgery | Admitting: Orthopedic Surgery

## 2014-06-14 ENCOUNTER — Other Ambulatory Visit: Payer: Self-pay | Admitting: Orthopedic Surgery

## 2014-06-14 DIAGNOSIS — M5416 Radiculopathy, lumbar region: Secondary | ICD-10-CM

## 2014-06-14 MED ORDER — IOHEXOL 180 MG/ML  SOLN: 1.0000 mL | Freq: Once | INTRAMUSCULAR | Status: AC | PRN

## 2014-06-14 MED ORDER — METHYLPREDNISOLONE ACETATE 40 MG/ML INJ SUSP (RADIOLOG: 120.0000 mg | Freq: Once | INTRAMUSCULAR | Status: DC

## 2014-06-16 ENCOUNTER — Other Ambulatory Visit: Payer: Self-pay

## 2014-08-26 ENCOUNTER — Emergency Department: Payer: Self-pay | Admitting: Emergency Medicine

## 2014-08-26 LAB — CBC WITH DIFFERENTIAL/PLATELET
BASOS PCT: 0.9 %
Basophil #: 0 10*3/uL (ref 0.0–0.1)
EOS PCT: 4.6 %
Eosinophil #: 0.2 10*3/uL (ref 0.0–0.7)
HCT: 42.2 % (ref 35.0–47.0)
HGB: 14.3 g/dL (ref 12.0–16.0)
LYMPHS PCT: 24.3 %
Lymphocyte #: 1.1 10*3/uL (ref 1.0–3.6)
MCH: 31.7 pg (ref 26.0–34.0)
MCHC: 33.8 g/dL (ref 32.0–36.0)
MCV: 94 fL (ref 80–100)
Monocyte #: 0.4 x10 3/mm (ref 0.2–0.9)
Monocyte %: 7.7 %
Neutrophil #: 2.9 10*3/uL (ref 1.4–6.5)
Neutrophil %: 62.5 %
PLATELETS: 188 10*3/uL (ref 150–440)
RBC: 4.51 10*6/uL (ref 3.80–5.20)
RDW: 13.2 % (ref 11.5–14.5)
WBC: 4.6 10*3/uL (ref 3.6–11.0)

## 2014-08-26 LAB — BASIC METABOLIC PANEL
ANION GAP: 7 (ref 7–16)
BUN: 7 mg/dL (ref 7–18)
CALCIUM: 8.8 mg/dL (ref 8.5–10.1)
CHLORIDE: 101 mmol/L (ref 98–107)
CO2: 29 mmol/L (ref 21–32)
Creatinine: 0.52 mg/dL — ABNORMAL LOW (ref 0.60–1.30)
EGFR (African American): >60
GLUCOSE: 100 mg/dL — AB (ref 65–99)
Osmolality: 272 (ref 275–301)
Potassium: 3.8 mmol/L (ref 3.5–5.1)
Sodium: 137 mmol/L (ref 136–145)

## 2014-08-26 LAB — TROPONIN I: Troponin-I: <0.02 ng/mL

## 2014-10-12 DIAGNOSIS — M810 Age-related osteoporosis without current pathological fracture: Secondary | ICD-10-CM | POA: Insufficient documentation

## 2014-10-12 DIAGNOSIS — M1990 Unspecified osteoarthritis, unspecified site: Secondary | ICD-10-CM | POA: Insufficient documentation

## 2014-10-30 ENCOUNTER — Observation Stay: Payer: Self-pay | Admitting: Infectious Diseases

## 2014-11-10 ENCOUNTER — Inpatient Hospital Stay: Payer: Self-pay | Admitting: Internal Medicine

## 2015-01-08 NOTE — H&P (Signed)
PATIENT NAME:  Janice Hoffman, Janice Hoffman MR#:  [id number] DATE OF BIRTH:  [birth year]  DATE OF ADMISSION:  11/10/2014  PRIMARY CARE PHYSICIAN: John B. Sarina Ser, MD   REFERRING EMERGENCY ROOM PHYSICIAN: Sheryl L. Benjaman Lobe, MD   CHIEF COMPLAINT: Left lower extremity pain.   HISTORY OF PRESENT ILLNESS: The patient is an 81 year old pleasant female with history of COPD, who came into the ED with acute on chronic left lower extremity pain. The patient is reporting that she fell approximately 4 years ago. Following that, she had surgery done. Eventually, she was having persistent left lower extremity pain, so she was followed by the surgeon and surgical pins were taken out, but the patient's pain was persistent still. For the past few days, her left lower extremity pain is getting worse and came into the ED. In the ED, she had an ultrasound of the lower extremity done and DVT was ruled out. She was given 1 dose of Ultram, following which she had 1 episode of seizure, as witnessed by the ER staff. The patient was also found to be wheezing. Given the history of COPD and recent treatment for pneumonia, the ER physician was concerned and she was given IV Solu-Medrol and nebulizer treatments for the acute exacerbation of COPD. During my examination, the patient is resting comfortably, everything was fine. Denies any chest tightness or coughing.   PAST MEDICAL HISTORY: COPD, chronic constipation, chronic left lower extremity pain, history of herpes zoster.   PAST SURGICAL HISTORY: Appendectomy, hysterectomy.   ALLERGIES: ALEVE, ALLEGRA, ASPIRIN, CODEINE, IBUPROFEN, KEFLEX, PENICILLIN, PHENERGAN.   PSYCHOSOCIAL HISTORY: Lives at home, lives alone. Son lives close by, who is a Restaurant manager, fast food. She quit smoking. Denies alcohol or illicit drug usage.   FAMILY HISTORY: COPD and hypertension runs in her family.   REVIEW OF SYSTEMS:   CONSTITUTIONAL: Denies any fevers, any weight loss changes or weight gain.  EYES: Denies  any blurred vision, double vision.  EARS, NOSE, AND THROAT:: Denies epistaxis, discharge.  RESPIRATORY: Denies cough. Has chronic history of COPD. The ER staff has noticed some wheezing.  CARDIOVASCULAR: No chest pain, palpitations.  GASTROINTESTINAL: Denies nausea, vomiting, diarrhea, abdominal pain, hematemesis, melena.  GENITOURINARY: No dysuria or hematuria.  ENDOCRINE: Denies polyuria, nocturia, or thyroid problems.  LYMPHATIC: No anemia, easy bruising, bleeding.  INTEGUMENTARY: No acne, rash, lesions.  MUSCULOSKELETAL: No joint pain in the neck and back. Denies any shoulder pain. No gout. Chronic left lower extremity pain. NEUROLOGIC: Denies vertigo, ataxia.  PSYCHIATRIC: No ADD or OCD.   HOME MEDICATION LIST: Vitamin D3 at 1 tablet p.o. once daily, B12 at 1000 mcg 1 tablet p.o. once daily, tramadol 50 mg 1 tablet p.o. q. 6 hours as needed for pain, B complex 1 tablet p.o. once daily; ProAir 2 puffs inhalation 4 times a day, gabapentin 300 mg 1 capsule p.o. once daily as needed for pain. Recently, the patient was started on azithromycin, amlodipine 5 mg 1 tablet p.o. once daily, albuterol nebulizers as needed.   PHYSICAL EXAMINATION:  VITAL SIGNS: Temperature 98.7, pulse 76, respirations 20, blood pressure 162/67, pulse oximetry is 100% on room air.  GENERAL APPEARANCE: Moderately built and thin-looking female seen in no apparent distress.  HEENT: Normocephalic, atraumatic. Pupils are equal, reacting to light and accommodation. No scleral icterus, no conjunctival injection. No sinus tenderness. No postnasal drip. Moist mucous membranes.  NECK: Supple. No JVD. No thyromegaly. Range of motion is intact.  LUNGS: End expiratory wheezing. No. accessory muscle usage. No anterior chest wall  tenderness on palpation.  CARDIAC: S1, S2 normal. Regular rate and rhythm. No murmurs.  GASTROINTESTINAL: Soft. Bowel sounds are positive in all 4 quadrants. Nontender, nondistended. No masses.    NEUROLOGICAL: Awake, alert obtained x 3. Cranial nerves II through XII are grossly intact. Motor and sensory are intact. Reflexes are 2+.  EXTREMITIES: No edema. No cyanosis. No clubbing.  SKIN: Warm, dry, no rashes, no lesions.  MUSCULOSKELETAL: No joint effusion, tenderness, erythema.  PSYCHIATRIC: Normal mood and affect.   LABORATORY AND IMAGING STUDIES: LFTs are normal. Troponin less than 0.02. CBC is normal. ABG: pH 7.44, pCO2 of 45 pO2 of 161, FiO2 of 36%, bicarbonate is 30.6. BMP: BUN normal, creatinine 0.59, sodium 135, potassium 3.4, chloride 95. The rest of the BMP is normal. Serum alcohol level is normal CT head without contrast: No acute intracranial abnormality, mucosal thickening with air fluid level and right sphenoid sinus mucosal thickening with a partial opacification of the bilateral ethmoidal air cells. Stable cerebral atrophy. Chest x-ray, portable, single view: Emphysema without acute cardiopulmonary disease. Left lower extremity venous Doppler ruled out DVT. A 12-lead EKG: Normal sinus rhythm at 70 beats per minute, no acute ST or T-wave changes.   ASSESSMENT AND PLAN: An 81 year old Caucasian female who came into the ED with acute on chronic left lower extremity pain. Deep vein thrombosis was ruled out with ultrasound after giving Ultram, she had an episode of seizures. Following this, the patient started wheezing. The patient was given Solu-Medrol and nebulizer treatments and hospitalist team is called to admit the patient.  1. New onset seizures, etiology unclear. This can be from Ultram, but the patient has been taking this medication at home, so this is an unlikely cause. At this point of time, we will observe her closely and provide her Ativan IV as needed basis. If she seizes again, we will go ahead with Keppra. We will obtain an EEG, and we will consider neurology consult.  2. Acute exacerbation of chronic obstructive pulmonary disease. We will continue Solu-Medrol 60 mg  IV q. 6 hours and nebulizer treatments. We will provide albuterol nebulizer treatments as needed basis. We will continue azithromycin, which was just started yesterday, according to the pharmacy report.  3. Acute on chronic left lower extremity pain. We will provide Tylenol as needed for pain. The patient is allergic to IBUPROFEN and CODEINE, so the options are limited. We will continue her home medication, Ultram as needed basis.  4. Chronic constipation. Put her on a bowel regimen with Colace and Senna.  5. We will provide gastrointestinal and deep vein thrombosis prophylaxis.   CODE STATUS: She is full code. Son is the medical power of attorney.   Plan of care discussed with the patient. She is aware of the plan.   TOTAL TIME SPENT: Is 50 minutes     ____________________________ [name], MD ag:mw D: 11/10/2014 14:06:05 ET T: 11/10/2014 15:22:24 ET JOB#: [id number]  cc: [name], MD, <Dictator> John B. Sarina Ser, MD [name] MD ELECTRONICALLY SIGNED 11/28/2014 22:34

## 2015-01-08 NOTE — H&P (Signed)
PATIENT NAME:  Janice Hoffman, HUIZENGA MR#:  [id number] DATE OF BIRTH:  1926-05-16  DATE OF ADMISSION:  10/30/2014  REFERRING PHYSICIAN: Larae Grooms, MD  PRIMARY CARE PHYSICIAN: Lisette Grinder III, MD  REASON FOR ADMISSION: Chronic obstructive pulmonary disease exacerbation.   HISTORY OF PRESENT ILLNESS: The patient is an 81 year old female with a significant history of chronic obstructive pulmonary disease/asthma, and previous pneumonia who presents to the Emergency Room with acute onset of worsening shortness of breath over the past 2-3 days. In the Emergency Room, the patient was noted to be tachypneic and initially hypoxic. She was given IV steroids and SVNs with only minimal improvement of her symptoms. She is now admitted for further evaluation. She denies chest pain or palpitations. No syncope.   PAST MEDICAL HISTORY:  1.  Chronic obstructive pulmonary disease/asthma.  2.  History of pneumonia.  3.  History of herpes zoster.  4.  Chronic constipation.  5.  Status post appendectomy.  6.  Status post hysterectomy.   MEDICATIONS:  1.  Xopenex SVN q. 6 hours p.r.n.  2.  Vitamin D3, 1000 units p.o. daily.  3.  Vitamin C 500 mg p.o. daily.  4.  Tramadol 50 mg p.o. at bedtime.  5.  Albuterol 2 puffs every 4 hours p.r.n. shortness of breath.  6.  Gabapentin 300 mg p.o. t.i.d.  7.  Multivitamin 1 p.o. daily.  8.  Advair 250/50 1 puff b.i.d.  9.  Norco 5/325, 1 p.o. q. 8 hours as needed for pain.   ALLERGIES: KEFLEX, PHENERGAN, PENICILLIN, CODEINE, ALLEGRA AND ASPIRIN.   SOCIAL HISTORY: Negative for alcohol or tobacco abuse.   FAMILY HISTORY: Positive for chronic obstructive pulmonary disease and hypertension but otherwise unremarkable.   REVIEW OF SYSTEMS: CONSTITUTIONAL: No fever or change in weight.  EYES: No blurred or double vision. No glaucoma.  ENT: No tinnitus or hearing loss. No nasal discharge or bleeding. No difficulty swallowing.  RESPIRATORY: The patient has had cough and  wheezing but denies hemoptysis. No painful respiration.  CARDIOVASCULAR: No chest pain or palpitations. No syncope.  GASTROINTESTINAL: No nausea, vomiting, or diarrhea. No abdominal pain. No change in bowel habits.  GENITOURINARY: No dysuria or hematuria. No incontinence.  ENDOCRINE: No polyuria or polydipsia. No heat or cold intolerance.  HEMATOLOGIC: The patient denies anemia, easy bruising, or bleeding.  LYMPHATIC: No swollen glands.  MUSCULOSKELETAL: The patient denies pain in her neck, back, shoulders, knees, or hips. No gout.  NEUROLOGIC: No numbness or migraines. Denies stroke or seizures.  PSYCHOLOGICAL: The patient denies anxiety, insomnia or depression.   PHYSICAL EXAMINATION:  GENERAL: The patient is acutely ill-appearing, in moderate respiratory distress.  VITAL SIGNS: Currently remarkable for a blood pressure of 127/66, with a heart rate of 90, respiratory rate of 17, temperature of 97.6 and a saturation of 99% on 2 liters.  HEENT: Normocephalic, atraumatic. Pupils equally round, reactive to light and accommodation. Extraocular movements are intact. Sclerae are anicteric. Conjunctivae are clear.  OROPHARYNX: Dry, but clear.  NECK: Supple without JVD. No adenopathy or thyromegaly is noted.  LUNGS: Revealed decreased breath sounds throughout with scattered wheezes and rhonchi. No rales. No dullness. Respiratory effort is increased.  CARDIAC: Regular rate and rhythm with a normal S1, S2. No significant rubs, murmurs or gallops. PMI is nondisplaced. Chest wall is nontender.   ABDOMEN: Soft, nontender, with normoactive bowel sounds. No organomegaly or masses were appreciated. No hernias or bruits were noted.  EXTREMITIES: No clubbing, cyanosis, or edema. Pulses were 2+  bilaterally.  SKIN: Warm and dry without rash or lesions.  NEUROLOGIC: Cranial nerves II-XII grossly intact. Deep tendon reflexes were symmetric. Motor and sensory exam is nonfocal.  PSYCHIATRIC: Revealed a patient who  is alert and oriented to person, place, and time. She was cooperative and used good judgment.   LABORATORY DATA: EKG revealed sinus rhythm with no acute ischemic changes. Chest x-ray revealed chronic obstructive pulmonary disease with no active disease. White count was 4.8 with a hemoglobin of 13.9. Her glucose was 109 with a BUN of 13, creatinine of 0.59 with a sodium of 137 and a potassium of 3.8 and a GFR greater than 60. Her troponin was less than 0.02.   ASSESSMENT:  1.  Chronic obstructive pulmonary disease exacerbation.  2.  Presumed bronchitis.  3.  History of pneumonia.  4.  Chronic constipation.   PLAN: The patient will be admitted to the floor with IV steroids, IV antibiotics, DuoNeb SVNs, and oxygen. We will continue Advair and add Spiriva. Wean oxygen as tolerated. Consult pulmonology. Follow sugars while on steroids. We will repeat routine labs and chest x-ray in the morning. Further treatment and evaluation will depend upon the patient's progress.   TOTAL TIME SPENT ON THIS PATIENT: 50 minutes.    ___________________________ Leonie Douglas Doy Hutching, MD jds:mc D: 10/30/2014 12:42:38 ET T: 10/30/2014 12:54:45 ET JOB#: [id number]  cc: Leonie Douglas. Doy Hutching, MD, <Dictator> John B. Sarina Ser, MD  Makayah Pauli Lennice Sites MD ELECTRONICALLY SIGNED 10/30/2014 17:41

## 2015-01-08 NOTE — Discharge Summary (Signed)
PATIENT NAME:  Janice Hoffman, Janice Hoffman MR#:  [id number] DATE OF BIRTH:  11/27/1925  DATE OF ADMISSION:  10/30/2014 DATE OF DISCHARGE:  11/01/2014  DISCHARGE DIAGNOSES: Chronic obstructive pulmonary disease exacerbation.   HISTORY OF PRESENT ILLNESS: Please see initial history and physical from Dr. Doy Hutching, February 21. Briefly, the patient is an 81 year old female admitted with cough, wheeze, shortness of breath. She was found to have a COPD exacerbation.  HOSPITAL COURSE BY ISSUE: COPD exacerbation. Chest x-ray was negative for pneumonia. White blood count was normal. The patient was afebrile. She was treated with nebulizers and steroids as well as oxygen. The patient had clinical response. She was discharged home on a steroid taper as well as inhalers.   DISCHARGE CONDITION: Fair.  CODE STATUS: Full code.  DISCHARGE DIET: Regular.   DISCHARGE MEDICATIONS: Please see [facility] physician discharge instructions.   DISCHARGE FOLLOWUP: The patient will follow up with Dr. Gilford Rile within 1-2 weeks.   TIME SPENT: This discharge took 35 minutes.   ____________________________ Cheral Marker. Ola Spurr, MD dpf:bm D: 11/15/2014 20:06:50 ET T: 11/16/2014 02:19:36 ET JOB#: [id number]  cc: Cheral Marker. Ola Spurr, MD, <Dictator> DAVID Ola Spurr MD ELECTRONICALLY SIGNED 11/16/2014 7:12

## 2015-01-08 NOTE — Discharge Summary (Signed)
PATIENT NAME:  Janice Hoffman, PHARRIS MR#:  [id number] DATE OF BIRTH:  09-20-1925  DATE OF ADMISSION:  11/10/2014 DATE OF DISCHARGE:  11/13/2014  PRIMARY CARE PHYSICIAN: John B. Sarina Ser, MD  DISCHARGE DIAGNOSES: 1.  Chronic obstructive pulmonary disease exacerbation.  2.  New onset seizure.  3.  Acute on chronic left lower extremity pain.   CONDITION: Stable.   CODE STATUS: Full code.   HOME MEDICATIONS:  Please refer to the medication reconciliation list.  DIET: Low-sodium diet.   ACTIVITY: As tolerated.   FOLLOWUP CARE: Follow with PCP and [facility] within 1-2 weeks.   REASON FOR ADMISSION: Lower extremity pain.   HOSPITAL COURSE: The patient is an 81 year old female with a history of COPD, comes to  ED due to acute on chronic left lower extremity pain. The patient was given treatment. While in ED, was noted to have 1 episode of seizure. In addition, the patient was found to be wheezing. She was treated with Solu-Medrol and admitted for COPD exacerbation and new onset seizure. For detailed history and physical examination, please refer to the admission note dictated by Dr. Margaretmary Eddy.  On admission date, the patient's CAT scan of head without contrast showed no acute intracranial abnormality. Chest x-ray showed emphysema without any acute cardiopulmonary disease. Left lower extremity venous duplex did not show any DVT.  1.  New onset seizure. The patient had no seizure activity after admission. The patient denied any seizure activity. She refused to get an EEG.  Since patient had only 1 episode of seizure, we did not give any treatment with Keppra or Dilantin. The patient may need to follow up with neurologist as outpatient.  2.  For chronic obstructive pulmonary disease exacerbation, the patient has been treated with IV Solu-Medrol, nebulizer and Advair. The patient's symptoms have much improved. She only has cough and mild wheezing. She is off oxygen. Her vital signs are stable. She  is clinically stable and will be discharged to home today. I discussed the patient's discharge plan with the patient and the nurse.   TIME SPENT: About 35 minutes.    ____________________________ Demetrios Loll, MD qc:LT D: 11/13/2014 13:57:10 ET T: 11/13/2014 16:53:34 ET JOB#: [id number]  cc: Demetrios Loll, MD, <Dictator> Demetrios Loll MD ELECTRONICALLY SIGNED 11/14/2014 16:01

## 2015-01-25 ENCOUNTER — Encounter: Payer: Self-pay | Admitting: Emergency Medicine

## 2015-01-25 ENCOUNTER — Other Ambulatory Visit: Payer: Self-pay

## 2015-01-25 ENCOUNTER — Emergency Department: Payer: Medicare Other

## 2015-01-25 ENCOUNTER — Emergency Department
Admission: EM | Admit: 2015-01-25 | Discharge: 2015-01-25 | Disposition: A | Discharge status: Home / Self Care | Payer: Medicare Other | Attending: Student | Admitting: Student

## 2015-01-25 DIAGNOSIS — J159 Unspecified bacterial pneumonia: Secondary | ICD-10-CM | POA: Insufficient documentation

## 2015-01-25 DIAGNOSIS — R0602 Shortness of breath: Secondary | ICD-10-CM | POA: Diagnosis present

## 2015-01-25 DIAGNOSIS — Z880 Allergy status to penicillin: Secondary | ICD-10-CM | POA: Diagnosis not present

## 2015-01-25 DIAGNOSIS — M79605 Pain in left leg: Secondary | ICD-10-CM

## 2015-01-25 DIAGNOSIS — J45901 Unspecified asthma with (acute) exacerbation: Secondary | ICD-10-CM

## 2015-01-25 DIAGNOSIS — J189 Pneumonia, unspecified organism: Secondary | ICD-10-CM

## 2015-01-25 DIAGNOSIS — Z7951 Long term (current) use of inhaled steroids: Secondary | ICD-10-CM | POA: Insufficient documentation

## 2015-01-25 HISTORY — DX: Unspecified asthma, uncomplicated: J45.909

## 2015-01-25 LAB — CBC WITH DIFFERENTIAL/PLATELET
BASOS ABS: 0 10*3/uL (ref 0–0.1)
Basophils Relative: 1 %
EOS ABS: 0.5 10*3/uL (ref 0–0.7)
EOS PCT: 13 %
HEMATOCRIT: 41.1 % (ref 35.0–47.0)
Hemoglobin: 13.8 g/dL (ref 12.0–16.0)
LYMPHS PCT: 25 %
Lymphs Abs: 1 10*3/uL (ref 1.0–3.6)
MCH: 31.1 pg (ref 26.0–34.0)
MCHC: 33.7 g/dL (ref 32.0–36.0)
MCV: 92.1 fL (ref 80.0–100.0)
MONO ABS: 0.3 10*3/uL (ref 0.2–0.9)
Monocytes Relative: 7 %
Neutro Abs: 2 10*3/uL (ref 1.4–6.5)
Neutrophils Relative %: 54 %
Platelets: 156 10*3/uL (ref 150–440)
RBC: 4.46 MIL/uL (ref 3.80–5.20)
RDW: 12.8 % (ref 11.5–14.5)
WBC: 3.8 10*3/uL (ref 3.6–11.0)

## 2015-01-25 LAB — BASIC METABOLIC PANEL
ANION GAP: 10 (ref 5–15)
BUN: 7 mg/dL (ref 6–20)
CALCIUM: 9.3 mg/dL (ref 8.9–10.3)
CO2: 29 mmol/L (ref 22–32)
CREATININE: 0.46 mg/dL (ref 0.44–1.00)
Chloride: 100 mmol/L — ABNORMAL LOW (ref 101–111)
GFR calc Af Amer: >60 mL/min (ref >60)
GLUCOSE: 104 mg/dL — AB (ref 65–99)
POTASSIUM: 3.7 mmol/L (ref 3.5–5.1)
Sodium: 139 mmol/L (ref 135–145)

## 2015-01-25 LAB — TROPONIN I

## 2015-01-25 LAB — BRAIN NATRIURETIC PEPTIDE: B Natriuretic Peptide: 80 pg/mL (ref 0.0–100.0)

## 2015-01-25 MED ORDER — TRAMADOL HCL 50 MG PO TABS
ORAL_TABLET | ORAL | Status: AC
  Administered 2015-01-25: 50 mg
  Filled 2015-01-25: qty 1

## 2015-01-25 MED ORDER — IPRATROPIUM-ALBUTEROL 0.5-2.5 (3) MG/3ML IN SOLN: 3.0000 mL | Freq: Once | RESPIRATORY_TRACT | Status: AC

## 2015-01-25 MED ORDER — METHYLPREDNISOLONE SODIUM SUCC 125 MG IJ SOLR
INTRAMUSCULAR | Status: AC
  Administered 2015-01-25: 125 mg via INTRAVENOUS
  Filled 2015-01-25: qty 2

## 2015-01-25 MED ORDER — PREDNISONE 20 MG PO TABS: 40.0000 mg | ORAL_TABLET | Freq: Every day | ORAL | Status: AC

## 2015-01-25 MED ORDER — LEVOFLOXACIN 500 MG PO TABS
ORAL_TABLET | ORAL | Status: AC
Start: 2015-01-25 — End: 2015-01-25
  Administered 2015-01-25: 14:00:00
  Filled 2015-01-25: qty 1

## 2015-01-25 MED ORDER — TRAMADOL HCL 50 MG PO TABS: 50.0000 mg | ORAL_TABLET | Freq: Once | ORAL | Status: DC

## 2015-01-25 MED ORDER — MORPHINE SULFATE 4 MG/ML IJ SOLN
INTRAMUSCULAR | Status: AC
  Administered 2015-01-25: 4 mg via INTRAVENOUS
  Filled 2015-01-25: qty 1

## 2015-01-25 MED ORDER — ONDANSETRON HCL 4 MG/2ML IJ SOLN
4.0000 mg | Freq: Once | INTRAMUSCULAR | Status: AC
  Administered 2015-01-25: 4 mg via INTRAVENOUS

## 2015-01-25 MED ORDER — ONDANSETRON HCL 4 MG/2ML IJ SOLN
INTRAMUSCULAR | Status: AC
  Administered 2015-01-25: 4 mg via INTRAVENOUS
  Filled 2015-01-25: qty 2

## 2015-01-25 MED ORDER — LEVOFLOXACIN 250 MG PO TABS
ORAL_TABLET | ORAL | Status: AC
Start: 2015-01-25 — End: 2015-01-25
  Administered 2015-01-25: 750 mg
  Filled 2015-01-25: qty 1

## 2015-01-25 MED ORDER — IPRATROPIUM-ALBUTEROL 0.5-2.5 (3) MG/3ML IN SOLN
RESPIRATORY_TRACT | Status: AC
  Administered 2015-01-25: 3 mL
  Filled 2015-01-25: qty 3

## 2015-01-25 MED ORDER — METHYLPREDNISOLONE SODIUM SUCC 125 MG IJ SOLR
125.0000 mg | Freq: Once | INTRAMUSCULAR | Status: AC
  Administered 2015-01-25: 125 mg via INTRAVENOUS

## 2015-01-25 MED ORDER — LEVOFLOXACIN 750 MG PO TABS: 750.0000 mg | ORAL_TABLET | Freq: Every day | ORAL | Status: AC

## 2015-01-25 MED ORDER — MORPHINE SULFATE 4 MG/ML IJ SOLN
4.0000 mg | Freq: Once | INTRAMUSCULAR | Status: AC
  Administered 2015-01-25: 4 mg via INTRAVENOUS

## 2015-01-25 NOTE — ED Notes (Signed)
Brought in via ems from home with some diff breathing and pain to left lower leg.

## 2015-01-25 NOTE — ED Notes (Signed)
MD at bedside. 

## 2015-01-25 NOTE — ED Provider Notes (Addendum)
[facility] Emergency Department Provider Note  ____________________________________________  Time seen: Approximately 11:49 AM  I have reviewed the triage vital signs and the nursing notes.   HISTORY  Chief Complaint Shortness of Breath    HPI Janice Hoffman is a 81 y.o. female with history of asthma, chronic left leg pain presents for evaluation of left leg pain and shortness of breath. The patient reports that she has had pain in the left leg associated associated with the knee for the past 5 years since she had surgical fixation of a broken bone. She takes Ultram for this. Today she reports the pain is severe and she also developed shortness of breath. On EMS arrival she was noted to be wheezing and hypertensive. She reports that when the pain in her leg is severe she becomes short of breath. She is not having any chest pain, no fevers, no vomiting or diarrhea. No recent falls or injury to the leg. Current severity 10 out of 10. She is unable to describe the quality of the pain. Palpation and movement makes the pain in her leg worse.   Past Medical History  Diagnosis Date  . Asthma     Patient Active Problem List   Diagnosis Date Noted  . 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 ELEVATED BLOOD PRESSURE 05/12/2008  . HERPES ZOSTER 04/26/2008  . B12 DEFICIENCY 04/26/2008  . CONSTIPATION 04/26/2008  . LEG EDEMA, BILATERAL 04/26/2008    No past surgical history on file.  Current Outpatient Rx  Name  Route  Sig  Dispense  Refill  . albuterol (PROVENTIL) (5 MG/ML) 0.5% nebulizer solution   Nebulization   Take 2.5 mg by nebulization every 6 (six) hours as needed for wheezing or shortness of breath.         . budesonide-formoterol (SYMBICORT) 160-4.5 MCG/ACT inhaler   Inhalation   Inhale 2 puffs into the lungs 2 (two) times daily.         . traMADol (ULTRAM) 50 MG tablet   Oral   Take 50 mg by mouth every 6 (six) hours as needed.            Allergies Nsaids; Penicillins; and Promethazine hcl  No family history on file.  Social History History  Substance Use Topics  . Smoking status: Never Smoker   . Smokeless tobacco: Not on file  . Alcohol Use: No    Review of Systems Constitutional: No fever/chills Eyes: No visual changes. ENT: No sore throat. Cardiovascular: Denies chest pain. Respiratory: + shortness of breath. Gastrointestinal: No abdominal pain.  No nausea, no vomiting.  No diarrhea.  No constipation. Genitourinary: Negative for dysuria. Musculoskeletal: Negative for back pain. Skin: Negative for rash. Neurological: Negative for headaches, focal weakness or numbness.  10-point ROS otherwise negative.  ____________________________________________   PHYSICAL EXAM:  Filed Vitals:   01/25/15 1157 01/25/15 1311 01/25/15 1318 01/25/15 1338  BP: 190/98  158/74 136/109  Pulse: 95 87 88 85  Temp: 97 F (36.1 C)   98.6 F (37 C)  TempSrc: Oral   Oral  Resp: 22 14 16 16   Height: 5\' 4"  (1.626 m)     Weight: 118 lb (53.524 kg)     SpO2: 89% 93% 92% 96%      Constitutional: Alert and oriented. Diaphoretic,  Eyes: Conjunctivae are normal. PERRL. EOMI. Head: Atraumatic. Nose: No congestion/rhinnorhea. Mouth/Throat: Mucous membranes are moist.  Oropharynx non-erythematous. Neck: No stridor. Cardiovascular: Normal rate, regular rhythm. Grossly normal heart sounds.  Good  peripheral circulation. Respiratory: Increased work of breathing, diffuse expiratory wheeze Gastrointestinal: Soft and nontender. No distention. No abdominal bruits. No CVA tenderness. Genitourinary: Deferred Musculoskeletal: Mild tenderness associated with a healed scar over the left knee without erythema, no swelling, full range of motion of the knee, 2+ dorsalis pedis pulses bilaterally  Neurologic:  Normal speech and language. No gross focal neurologic deficits are appreciated. Speech is normal. Skin:  Skin is warm, dry and  intact. No rash noted. Psychiatric: Mood and affect are normal. Speech and behavior are normal.  ____________________________________________   LABS (all labs ordered are listed, but only abnormal results are displayed)  Labs Reviewed  BASIC METABOLIC PANEL - Abnormal; Notable for the following:    Chloride 100 (*)    Glucose, Bld 104 (*)    All other components within normal limits  CBC WITH DIFFERENTIAL/PLATELET  BRAIN NATRIURETIC PEPTIDE  TROPONIN I   ____________________________________________  EKG  ED ECG REPORT   Date: 01/25/2015  EKG Time: 11:47  Rate: 98  Rhythm: normal sinus rhythm  Axis: normal  Intervals:none  ST&T Change: no acute ST elevation  ____________________________________________  RADIOLOGY  Doppler US of left leg IMPRESSION: Negative for deep vein thrombosis in the left lower extremity.   CXR IMPRESSION: Hyperinflation again noted. No pulmonary edema. Streaky right basilar atelectasis or infiltrate. ____________________________________________   PROCEDURES  Procedure(s) performed: None  Critical Care performed: No  ____________________________________________   INITIAL IMPRESSION / ASSESSMENT AND PLAN / ED COURSE  Pertinent labs & imaging results that were available during my care of the patient were reviewed by me and considered in my medical decision making (see chart for details).  Janice Hoffman is a 81 y.o. female with history of asthma, chronic left leg pain presents for evaluation of left leg pain and shortness of breath. Leg exam is benign, pain is chronic. She does have faint expiratory wheeze which may be due to asthma exacerbation so will treat with steroids, nebs, we'll obtain screening labs, EKG, chest x-ray. We'll obtain Doppler ultrasound of the left leg to rule out DVT. Reassess for disposition.  ----------------------------------------- 1:36 PM on 01/25/2015 -----------------------------------------  Patient  reports her breathing has improved significantly. Diminished wheeze, increased air movement. She appears much more comfortable, is no longer tachypneic, no hypoxia.. Chest x-ray shows possible developing pneumonia so we'll discharge home with prednisone and Levaquin for possible asthma exacerbation with pneumonia. Discussed with her son at bedside who reports that this leg pain is chronic. No DVT on ultrasound. He reports that as long as she is compliant with her tramadol her pain is typically well controlled. He has plans to take her to her primary care doctor for possible referral to chronic pain clinic. Her pain is much improved and she appears comfortable. Return precautions discussed. DC home. Troponin negative, not consistent with ACS. Doubt PE.  ____________________________________________   FINAL CLINICAL IMPRESSION(S) / ED DIAGNOSES  Final diagnoses:  Asthma, unspecified asthma severity, with acute exacerbation  Community acquired pneumonia  Leg pain, anterior, left      Joanne Gavel, MD 01/25/15 1356  Joanne Gavel, MD 01/25/15 1400

## 2015-01-25 NOTE — ED Notes (Signed)
Family at bedside. 

## 2015-01-25 NOTE — ED Notes (Signed)
Up to bathroom with assistance. Some exp wheezing noted with exertion

## 2015-05-23 ENCOUNTER — Emergency Department
Admission: EM | Admit: 2015-05-23 | Discharge: 2015-05-23 | Disposition: A | Discharge status: Home / Self Care | Payer: Medicare Other | Attending: Emergency Medicine | Admitting: Emergency Medicine

## 2015-05-23 ENCOUNTER — Emergency Department: Payer: Medicare Other

## 2015-05-23 ENCOUNTER — Encounter: Payer: Self-pay | Admitting: Emergency Medicine

## 2015-05-23 DIAGNOSIS — Z7951 Long term (current) use of inhaled steroids: Secondary | ICD-10-CM | POA: Diagnosis not present

## 2015-05-23 DIAGNOSIS — Z880 Allergy status to penicillin: Secondary | ICD-10-CM | POA: Insufficient documentation

## 2015-05-23 DIAGNOSIS — Z7952 Long term (current) use of systemic steroids: Secondary | ICD-10-CM | POA: Diagnosis not present

## 2015-05-23 DIAGNOSIS — M79605 Pain in left leg: Secondary | ICD-10-CM

## 2015-05-23 DIAGNOSIS — G8929 Other chronic pain: Secondary | ICD-10-CM | POA: Insufficient documentation

## 2015-05-23 DIAGNOSIS — M25562 Pain in left knee: Secondary | ICD-10-CM | POA: Diagnosis not present

## 2015-05-23 DIAGNOSIS — M1712 Unilateral primary osteoarthritis, left knee: Secondary | ICD-10-CM

## 2015-05-23 MED ORDER — OXYCODONE-ACETAMINOPHEN 5-325 MG PO TABS
1.0000 | ORAL_TABLET | Freq: Once | ORAL | Status: AC
  Administered 2015-05-23: 1 via ORAL
  Filled 2015-05-23: qty 1

## 2015-05-23 MED ORDER — OXYCODONE-ACETAMINOPHEN 5-325 MG PO TABS
1.0000 | ORAL_TABLET | Freq: Once | ORAL | Status: AC
  Administered 2015-05-23: 1 via ORAL

## 2015-05-23 MED ORDER — OXYCODONE-ACETAMINOPHEN 5-325 MG PO TABS
ORAL_TABLET | ORAL | Status: AC
  Administered 2015-05-23: 1 via ORAL
  Filled 2015-05-23: qty 1

## 2015-05-23 MED ORDER — OXYCODONE-ACETAMINOPHEN 5-325 MG PO TABS: 1.0000 | ORAL_TABLET | ORAL | Status: DC | PRN

## 2015-05-23 NOTE — ED Notes (Signed)
Pt returned from u/s

## 2015-05-23 NOTE — Discharge Instructions (Signed)
Musculoskeletal Pain Musculoskeletal pain is muscle and boney aches and pains. These pains can occur in any part of the body. Your caregiver may treat you without knowing the cause of the pain. They may treat you if blood or urine tests, X-rays, and other tests were normal.  CAUSES There is often not a definite cause or reason for these pains. These pains may be caused by a type of germ (virus). The discomfort may also come from overuse. Overuse includes working out too hard when your body is not fit. Boney aches also come from weather changes. Bone is sensitive to atmospheric pressure changes. HOME CARE INSTRUCTIONS   Ask when your test results will be ready. Make sure you get your test results.  Only take over-the-counter or prescription medicines for pain, discomfort, or fever as directed by your caregiver. If you were given medications for your condition, do not drive, operate machinery or power tools, or sign legal documents for 24 hours. Do not drink alcohol. Do not take sleeping pills or other medications that may interfere with treatment.  Continue all activities unless the activities cause more pain. When the pain lessens, slowly resume normal activities. Gradually increase the intensity and duration of the activities or exercise.  During periods of severe pain, bed rest may be helpful. Lay or sit in any position that is comfortable.  Putting ice on the injured area.  Put ice in a bag.  Place a towel between your skin and the bag.  Leave the ice on for 15 to 20 minutes, 3 to 4 times a day.  Follow up with your caregiver for continued problems and no reason can be found for the pain. If the pain becomes worse or does not go away, it may be necessary to repeat tests or do additional testing. Your caregiver may need to look further for a possible cause. SEEK IMMEDIATE MEDICAL CARE IF:  You have pain that is getting worse and is not relieved by medications.  You develop chest pain  that is associated with shortness or breath, sweating, feeling sick to your stomach (nauseous), or throw up (vomit).  Your pain becomes localized to the abdomen.  You develop any new symptoms that seem different or that concern you. MAKE SURE YOU:   Understand these instructions.  Will watch your condition.  Will get help right away if you are not doing well or get worse. Document Released: 08/26/2005 Document Revised: 11/18/2011 Document Reviewed: 04/30/2013 [facility] Patient Information 2015 [place], [place]. This information is not intended to replace advice given to you by your health care provider. Make sure you discuss any questions you have with your health care provider.  Arthritis, Nonspecific Arthritis is pain, redness, warmth, or puffiness (inflammation) of a joint. The joint may be stiff or hurt when you move it. One or more joints may be affected. There are many types of arthritis. Your doctor may not know what type you have right away. The most common cause of arthritis is wear and tear on the joint (osteoarthritis). HOME CARE   Only take medicine as told by your doctor.  Rest the joint as much as possible.  Raise (elevate) your joint if it is puffy.  Use crutches if the painful joint is in your leg.  Drink enough fluids to keep your pee (urine) clear or pale yellow.  Follow your doctor's diet instructions.  Use cold packs for very bad joint pain for 10 to 15 minutes every hour. Ask your doctor if it  is okay for you to use hot packs.  Exercise as told by your doctor.  Take a warm shower if you have stiffness in the morning.  Move your sore joints throughout the day. GET HELP RIGHT AWAY IF:   You have a fever.  You have very bad joint pain, puffiness, or redness.  You have many joints that are painful and puffy.  You are not getting better with treatment.  You have very bad back pain or leg weakness.  You cannot control when you poop (bowel movement) or  pee (urinate).  You do not feel better in 24 hours or are getting worse.  You are having side effects from your medicine. MAKE SURE YOU:   Understand these instructions.  Will watch your condition.  Will get help right away if you are not doing well or get worse. Document Released: 11/20/2009 Document Revised: 02/25/2012 Document Reviewed: 11/20/2009 [facility] Patient Information 2015 [place], [place]. This information is not intended to replace advice given to you by your health care provider. Make sure you discuss any questions you have with your health care provider.

## 2015-05-23 NOTE — ED Provider Notes (Signed)
[facility] Emergency Department Provider Note  ____________________________________________  Time seen: 6:30AM  I have reviewed the triage vital signs and the nursing notes.   HISTORY  Chief Complaint Leg Pain      HPI Janice Hoffman is a 81 y.o. female Presents with nontraumatic left lower leg pain. Patient states that she has chronic pain in that leg however it acutely worsened this morning and was on relief with her tramadol which she normally takes.     Past Medical History  Diagnosis Date  . Asthma     Patient Active Problem List   Diagnosis Date Noted  . 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 ELEVATED BLOOD PRESSURE 05/12/2008  . HERPES ZOSTER 04/26/2008  . B12 DEFICIENCY 04/26/2008  . CONSTIPATION 04/26/2008  . LEG EDEMA, BILATERAL 04/26/2008    History reviewed. No pertinent past surgical history.  Current Outpatient Rx  Name  Route  Sig  Dispense  Refill  . albuterol (PROVENTIL) (5 MG/ML) 0.5% nebulizer solution   Nebulization   Take 2.5 mg by nebulization every 6 (six) hours as needed for wheezing or shortness of breath.         . budesonide-formoterol (SYMBICORT) 160-4.5 MCG/ACT inhaler   Inhalation   Inhale 2 puffs into the lungs 2 (two) times daily.         . predniSONE (DELTASONE) 20 MG tablet   Oral   Take 2 tablets (40 mg total) by mouth daily.   10 tablet   0   . traMADol (ULTRAM) 50 MG tablet   Oral   Take 50 mg by mouth every 6 (six) hours as needed.           Allergies Nsaids; Penicillins; and Promethazine hcl  History reviewed. No pertinent family history.  Social History Social History  Substance Use Topics  . Smoking status: Never Smoker   . Smokeless tobacco: None  . Alcohol Use: No    Review of Systems  Constitutional: Negative for fever. Eyes: Negative for visual changes. ENT: Negative for sore throat. Cardiovascular: Negative for chest pain. Respiratory: Negative for shortness of  breath. Gastrointestinal: Negative for abdominal pain, vomiting and diarrhea. Genitourinary: Negative for dysuria. Musculoskeletal: Negative for back pain.positive for left leg pain Skin: Negative for rash. Neurological: Negative for headaches, focal weakness or numbness.   10-point ROS otherwise negative.  ____________________________________________   PHYSICAL EXAM:  VITAL SIGNS: ED Triage Vitals  Enc Vitals Group     BP 05/23/15 0602 183/136 mmHg     Pulse Rate 05/23/15 0602 74     Resp 05/23/15 0602 18     Temp 05/23/15 0602 98.2 F (36.8 C)     Temp Source 05/23/15 0602 Oral     SpO2 05/23/15 0602 97 %     Weight 05/23/15 0602 128 lb (58.06 kg)     Height 05/23/15 0602 5\' 4"  (1.626 m)     Head Cir --      Peak Flow --      Pain Score --      Pain Loc --      Pain Edu? --      Excl. in [place]? --      Constitutional: Alert and oriented. Well appearing and in no distress. Eyes: Conjunctivae are normal. PERRL. Normal extraocular movements. ENT   Head: Normocephalic and atraumatic.   Nose: No congestion/rhinnorhea.   Mouth/Throat: Mucous membranes are moist.   Neck: No stridor. Hematological/Lymphatic/Immunilogical: No cervical lymphadenopathy. Cardiovascular: Normal rate, regular rhythm.  Normal and symmetric distal pulses are present in all extremities. No murmurs, rubs, or gallops. Respiratory: Normal respiratory effort without tachypnea nor retractions. Breath sounds are clear and equal bilaterally. No wheezes/rales/rhonchi. Gastrointestinal: Soft and nontender. No distention. There is no CVA tenderness. Genitourinary: deferred Musculoskeletal: Nontender with normal range of motion in all extremities. No joint effusions.  Left calf pain with palpation. Left knee pain with passive range of motion Skin:  Skin is warm, dry and intact. No rash noted. Psychiatric: Mood and affect are normal. Speech and behavior are normal. Patient exhibits appropriate insight  and judgment.      RADIOLOGY DG Knee 2 Views Left (Final result) Result time: 05/23/15 08:10:23   Final result by Rad Results In Interface (05/23/15 08:10:23)   Narrative:   CLINICAL DATA: Pain for 1 day. No history of recent trauma  EXAM: LEFT KNEE - 1-2 VIEW  COMPARISON: June 28, 2010  FINDINGS: Frontal and lateral views obtained. There is evidence of old healed fracture of the left lateral tibial plateau with extensive remodeling, stable. There is no demonstrable acute fracture or dislocation. There is no appreciable joint effusion. There is spurring in all compartments. There is moderate narrowing of the patellofemoral joint. Slightly milder narrowing is noted medially and laterally. There are scattered foci of arterial vascular calcification. There is a prominent spur along the anterior superior patella, a stable finding.  IMPRESSION: Remodeling due to old fracture in the lateral tibial plateau region. There is underlying osteoarthritic change in this area, extensive but stable. Generalized osteoarthritic change elsewhere is likewise stable. No acute fracture or dislocation. No joint effusion.   Electronically Signed By: Lowella Grip III M.D. On: 05/23/2015 08:10          US Venous Img Lower Unilateral Left (Final result) Result time: 05/23/15 07:37:53   Procedure changed from [place] Extrem Low Left Comp      Final result by Rad Results In Interface (05/23/15 07:37:53)   Narrative:   CLINICAL DATA: 81 year old female with left lower extremity pain for 2 days.  EXAM: LEFT LOWER EXTREMITY VENOUS DOPPLER ULTRASOUND  TECHNIQUE: Gray-scale sonography with graded compression, as well as color Doppler and duplex ultrasound were performed to evaluate the lower extremity deep venous systems from the level of the common femoral vein and including the common femoral, femoral, profunda femoral, popliteal and calf veins including the posterior  tibial, peroneal and gastrocnemius veins when visible. The superficial great saphenous vein was also interrogated. Spectral Doppler was utilized to evaluate flow at rest and with distal augmentation maneuvers in the common femoral, femoral and popliteal veins.  COMPARISON: None.  FINDINGS: Left deep venous system appears patent and compressible from groin through popliteal fossae.  Spontaneous venous flow present with evidence of respiratory phasicity. Augmentation intact.  No intraluminal thrombus identified.  Visualized portions of the greater saphenous veins patent.  The right common femoral vein is patent without DVT.  IMPRESSION: No evidence of left lower extremity deep venous thrombosis.   Electronically Signed By: Margarette Canada M.D. On: 05/23/2015 07:37              INITIAL IMPRESSION / ASSESSMENT AND PLAN / ED COURSE  Pertinent labs & imaging results that were available during my care of the patient were reviewed by me and considered in my medical decision making (see chart for details).    ____________________________________________   FINAL CLINICAL IMPRESSION(S) / ED DIAGNOSES  Final diagnoses:  Left leg pain  Arthritis of left knee  Gregor Hams, MD 05/25/15 [phone]

## 2015-05-23 NOTE — ED Notes (Signed)
Pt arrives via EMS from home with c/o left leg pain.  Pt states that the pain started last night and she took a Tramadol.  This morning the pain was worse.  Pt also c/o constipation.

## 2015-05-23 NOTE — ED Notes (Signed)
Pt given D/C instructions and RX.  Pt verbalized understanding.  Pt awaiting ride to go home.

## 2016-03-14 ENCOUNTER — Encounter: Payer: Self-pay | Admitting: Emergency Medicine

## 2016-03-14 ENCOUNTER — Inpatient Hospital Stay
Admission: EM | Admit: 2016-03-14 | Discharge: 2016-03-18 | DRG: 190 | Disposition: A | Discharge status: Home Health | Payer: Medicare Other | Attending: Internal Medicine | Admitting: Internal Medicine

## 2016-03-14 ENCOUNTER — Emergency Department: Payer: Medicare Other

## 2016-03-14 DIAGNOSIS — J441 Chronic obstructive pulmonary disease with (acute) exacerbation: Secondary | ICD-10-CM | POA: Diagnosis present

## 2016-03-14 DIAGNOSIS — E222 Syndrome of inappropriate secretion of antidiuretic hormone: Secondary | ICD-10-CM | POA: Diagnosis present

## 2016-03-14 DIAGNOSIS — R0600 Dyspnea, unspecified: Secondary | ICD-10-CM

## 2016-03-14 DIAGNOSIS — J189 Pneumonia, unspecified organism: Secondary | ICD-10-CM | POA: Diagnosis present

## 2016-03-14 DIAGNOSIS — R0602 Shortness of breath: Secondary | ICD-10-CM | POA: Diagnosis not present

## 2016-03-14 DIAGNOSIS — Z825 Family history of asthma and other chronic lower respiratory diseases: Secondary | ICD-10-CM

## 2016-03-14 DIAGNOSIS — J440 Chronic obstructive pulmonary disease with acute lower respiratory infection: Principal | ICD-10-CM | POA: Diagnosis present

## 2016-03-14 DIAGNOSIS — I10 Essential (primary) hypertension: Secondary | ICD-10-CM | POA: Diagnosis present

## 2016-03-14 DIAGNOSIS — E873 Alkalosis: Secondary | ICD-10-CM | POA: Diagnosis present

## 2016-03-14 DIAGNOSIS — J45909 Unspecified asthma, uncomplicated: Secondary | ICD-10-CM

## 2016-03-14 DIAGNOSIS — F419 Anxiety disorder, unspecified: Secondary | ICD-10-CM | POA: Diagnosis present

## 2016-03-14 HISTORY — DX: Neuralgia and neuritis, unspecified: M79.2

## 2016-03-14 HISTORY — DX: Essential (primary) hypertension: I10

## 2016-03-14 HISTORY — DX: Zoster without complications: B02.9

## 2016-03-14 HISTORY — DX: Chronic obstructive pulmonary disease, unspecified: J44.9

## 2016-03-14 LAB — COMPREHENSIVE METABOLIC PANEL
ALBUMIN: 4.4 g/dL (ref 3.5–5.0)
ALT: 16 U/L (ref 14–54)
ANION GAP: 9 (ref 5–15)
AST: 26 U/L (ref 15–41)
Alkaline Phosphatase: 44 U/L (ref 38–126)
BILIRUBIN TOTAL: 0.5 mg/dL (ref 0.3–1.2)
BUN: 12 mg/dL (ref 6–20)
CO2: 28 mmol/L (ref 22–32)
Calcium: 9.3 mg/dL (ref 8.9–10.3)
Chloride: 96 mmol/L — ABNORMAL LOW (ref 101–111)
Creatinine, Ser: 0.52 mg/dL (ref 0.44–1.00)
GFR calc Af Amer: >60 mL/min (ref >60)
GFR calc non Af Amer: >60 mL/min (ref >60)
GLUCOSE: 119 mg/dL — AB (ref 65–99)
POTASSIUM: 4 mmol/L (ref 3.5–5.1)
SODIUM: 133 mmol/L — AB (ref 135–145)
TOTAL PROTEIN: 7.5 g/dL (ref 6.5–8.1)

## 2016-03-14 LAB — TROPONIN I

## 2016-03-14 LAB — CBC
HCT: 41 % (ref 35.0–47.0)
Hemoglobin: 14.1 g/dL (ref 12.0–16.0)
MCH: 30.6 pg (ref 26.0–34.0)
MCHC: 34.4 g/dL (ref 32.0–36.0)
MCV: 88.9 fL (ref 80.0–100.0)
Platelets: 182 10*3/uL (ref 150–440)
RBC: 4.61 MIL/uL (ref 3.80–5.20)
RDW: 13.6 % (ref 11.5–14.5)
WBC: 4.9 10*3/uL (ref 3.6–11.0)

## 2016-03-14 LAB — BRAIN NATRIURETIC PEPTIDE: B NATRIURETIC PEPTIDE 5: 91 pg/mL (ref 0.0–100.0)

## 2016-03-14 MED ORDER — IPRATROPIUM-ALBUTEROL 0.5-2.5 (3) MG/3ML IN SOLN
3.0000 mL | Freq: Once | RESPIRATORY_TRACT | Status: AC
  Administered 2016-03-14: 3 mL via RESPIRATORY_TRACT
  Filled 2016-03-14: qty 3

## 2016-03-14 MED ORDER — METHYLPREDNISOLONE SODIUM SUCC 125 MG IJ SOLR
62.5000 mg | Freq: Once | INTRAMUSCULAR | Status: AC
  Administered 2016-03-14: 62.5 mg via INTRAVENOUS
  Filled 2016-03-14: qty 2

## 2016-03-14 NOTE — ED Provider Notes (Addendum)
[facility] Emergency Department Provider Note  Time seen: 10:18 PM  I have reviewed the triage vital signs and the nursing notes.   HISTORY  Chief Complaint Shortness of Breath and Anxiety    HPI Janice Hoffman is a 82 y.o. female with a past medical history of asthma, who presents the emergency department with difficulty breathing. According to the patient she began having difficulty breathing around 4 PM today. The patient denies any chest pain at any point. Patient denies any leg pain or swelling. Denies any fever, cough or congestion. Describes shortness of breath is moderate.     Past Medical History  Diagnosis Date  . Asthma     Patient Active Problem List   Diagnosis Date Noted  . 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 ELEVATED BLOOD PRESSURE 05/12/2008  . HERPES ZOSTER 04/26/2008  . B12 DEFICIENCY 04/26/2008  . CONSTIPATION 04/26/2008  . LEG EDEMA, BILATERAL 04/26/2008    History reviewed. No pertinent past surgical history.  Current Outpatient Rx  Name  Route  Sig  Dispense  Refill  . albuterol (PROVENTIL) (5 MG/ML) 0.5% nebulizer solution   Nebulization   Take 2.5 mg by nebulization every 6 (six) hours as needed for wheezing or shortness of breath.         . budesonide-formoterol (SYMBICORT) 160-4.5 MCG/ACT inhaler   Inhalation   Inhale 2 puffs into the lungs 2 (two) times daily.         Marland Kitchen oxyCODONE-acetaminophen (PERCOCET/ROXICET) 5-325 MG per tablet   Oral   Take 1 tablet by mouth every 4 (four) hours as needed for severe pain.   15 tablet   0   . traMADol (ULTRAM) 50 MG tablet   Oral   Take 50 mg by mouth every 6 (six) hours as needed.           Allergies Nsaids; Penicillins; and Promethazine hcl  No family history on file.  Social History Social History  Substance Use Topics  . Smoking status: Never Smoker   . Smokeless tobacco: None  . Alcohol Use: No    Review of Systems Constitutional: Negative for  fever Cardiovascular: Negative for chest pain. Respiratory: Positive for shortness of breath Gastrointestinal: Negative for abdominal pain Neurological: Negative for headache 10-point ROS otherwise negative.  ____________________________________________   PHYSICAL EXAM:  VITAL SIGNS: ED Triage Vitals  Enc Vitals Group     BP 03/14/16 2200 215/107 mmHg     Pulse Rate 03/14/16 2200 87     Resp 03/14/16 2200 22     Temp 03/14/16 2200 98.1 F (36.7 C)     Temp Source 03/14/16 2200 Oral     SpO2 03/14/16 2200 98 %     Weight 03/14/16 2200 128 lb (58.06 kg)     Height 03/14/16 2200 5\' 4"  (1.626 m)     Head Cir --      Peak Flow --      Pain Score --      Pain Loc --      Pain Edu? --      Excl. in [place]? --     Constitutional: Alert and oriented. Well appearing and in no distress. Eyes: Normal exam ENT   Head: Normocephalic and atraumatic.   Mouth/Throat: Mucous membranes are moist. Cardiovascular: Normal rate, regular rhythm. No murmur Respiratory: Normal respiratory effort without tachypnea nor retractions. Breath sounds are clear  Gastrointestinal: Soft and nontender. No distention. Musculoskeletal: Nontender with normal range of motion in all  extremities.  Neurologic:  Normal speech and language. No gross focal neurologic deficits Skin:  Skin is warm, dry and intact.  Psychiatric: Mood and affect are normal.   ____________________________________________    EKG  EKG reviewed and interpreted by myself Shows normal sinus rhythm at 79 bpm, slightly widened QRS, normal axis, largely normal intervals with mild QTC prolongation. Nonspecific ST changes.  ____________________________________________    RADIOLOGY  Chest x-ray negative.  ____________________________________________    INITIAL IMPRESSION / ASSESSMENT AND PLAN / ED COURSE  Pertinent labs & imaging results that were available during my care of the patient were reviewed by me and considered in my  medical decision making (see chart for details).  The patient presents the emergency department for shortness of breath since 4 PM today. Patient states she take albuterol at home without improvement so she came to the emergency department. On exam the patient has moderate expiratory wheeze bilaterally she is also quite hypertensive around [id number] systolic, denies taking anything for blood pressure. We will check labs, chest x-ray, dose duo nebs and closely monitor in the emergency department.  Patient's labs are largely within normal limits. Troponin negative. BNP of 91. Chest x-ray negative. Patient continues to have significant expiratory wheeze on examination, has a frequent cough and can still only speak in 2-3 word sentences due to shortness of breath. Given the patient's age, comorbidities and continued wheeze we'll admit to the hospital for further treatment.  ____________________________________________   FINAL CLINICAL IMPRESSION(S) / ED DIAGNOSES  Dyspnea Wheeze  Harvest Dark, MD 03/14/16 [id number]  Harvest Dark, MD 03/14/16 [phone]

## 2016-03-14 NOTE — ED Notes (Addendum)
Pt arrived via ems from home where she complains of being short of breath. Pt room air saturation 97%. Cough present and wheezing present. Per ems report daughter states pt has anxiety induced asthma. Pt took 2 albuterol at 9:20.

## 2016-03-15 ENCOUNTER — Inpatient Hospital Stay: Payer: Medicare Other

## 2016-03-15 ENCOUNTER — Encounter: Payer: Self-pay | Admitting: Internal Medicine

## 2016-03-15 DIAGNOSIS — J441 Chronic obstructive pulmonary disease with (acute) exacerbation: Secondary | ICD-10-CM | POA: Diagnosis present

## 2016-03-15 DIAGNOSIS — J189 Pneumonia, unspecified organism: Secondary | ICD-10-CM | POA: Diagnosis present

## 2016-03-15 DIAGNOSIS — E222 Syndrome of inappropriate secretion of antidiuretic hormone: Secondary | ICD-10-CM | POA: Diagnosis present

## 2016-03-15 DIAGNOSIS — Z825 Family history of asthma and other chronic lower respiratory diseases: Secondary | ICD-10-CM | POA: Diagnosis not present

## 2016-03-15 DIAGNOSIS — E873 Alkalosis: Secondary | ICD-10-CM | POA: Diagnosis present

## 2016-03-15 DIAGNOSIS — F419 Anxiety disorder, unspecified: Secondary | ICD-10-CM | POA: Diagnosis present

## 2016-03-15 DIAGNOSIS — R0602 Shortness of breath: Secondary | ICD-10-CM | POA: Diagnosis present

## 2016-03-15 DIAGNOSIS — J440 Chronic obstructive pulmonary disease with acute lower respiratory infection: Secondary | ICD-10-CM | POA: Diagnosis present

## 2016-03-15 DIAGNOSIS — I10 Essential (primary) hypertension: Secondary | ICD-10-CM | POA: Diagnosis present

## 2016-03-15 LAB — BASIC METABOLIC PANEL
Anion gap: 7 (ref 5–15)
BUN: 13 mg/dL (ref 6–20)
CALCIUM: 9.2 mg/dL (ref 8.9–10.3)
CO2: 28 mmol/L (ref 22–32)
CREATININE: 0.62 mg/dL (ref 0.44–1.00)
Chloride: 100 mmol/L — ABNORMAL LOW (ref 101–111)
GFR calc non Af Amer: >60 mL/min (ref >60)
GLUCOSE: 143 mg/dL — AB (ref 65–99)
Potassium: 3.7 mmol/L (ref 3.5–5.1)
Sodium: 135 mmol/L (ref 135–145)

## 2016-03-15 LAB — CBC
HCT: 36.9 % (ref 35.0–47.0)
Hemoglobin: 13.2 g/dL (ref 12.0–16.0)
MCH: 31.6 pg (ref 26.0–34.0)
MCHC: 35.8 g/dL (ref 32.0–36.0)
MCV: 88.2 fL (ref 80.0–100.0)
PLATELETS: 189 10*3/uL (ref 150–440)
RBC: 4.18 MIL/uL (ref 3.80–5.20)
RDW: 13.3 % (ref 11.5–14.5)
WBC: 4.1 10*3/uL (ref 3.6–11.0)

## 2016-03-15 MED ORDER — HYDRALAZINE HCL 20 MG/ML IJ SOLN
10.0000 mg | INTRAMUSCULAR | Status: DC | PRN
  Administered 2016-03-15 – 2016-03-18 (×3): 10 mg via INTRAVENOUS
  Filled 2016-03-15 (×3): qty 1

## 2016-03-15 MED ORDER — METHYLPREDNISOLONE SODIUM SUCC 125 MG IJ SOLR
60.0000 mg | Freq: Three times a day (TID) | INTRAMUSCULAR | Status: DC
  Administered 2016-03-15 – 2016-03-16 (×3): 60 mg via INTRAVENOUS
  Filled 2016-03-15 (×3): qty 2

## 2016-03-15 MED ORDER — ACETAMINOPHEN 325 MG PO TABS
650.0000 mg | ORAL_TABLET | Freq: Four times a day (QID) | ORAL | Status: DC | PRN
  Administered 2016-03-15: 11:00:00 325 mg via ORAL
  Filled 2016-03-15 (×2): qty 2
  Filled 2016-03-15: qty 1

## 2016-03-15 MED ORDER — POLYETHYLENE GLYCOL 3350 17 G PO PACK
17.0000 g | PACK | Freq: Every day | ORAL | Status: DC | PRN
  Administered 2016-03-16 – 2016-03-17 (×2): 17 g via ORAL
  Filled 2016-03-15 (×2): qty 1

## 2016-03-15 MED ORDER — ONDANSETRON HCL 4 MG PO TABS: 4.0000 mg | ORAL_TABLET | Freq: Four times a day (QID) | ORAL | Status: DC | PRN

## 2016-03-15 MED ORDER — ALBUTEROL SULFATE HFA 108 (90 BASE) MCG/ACT IN AERS: 2.0000 | INHALATION_SPRAY | Freq: Four times a day (QID) | RESPIRATORY_TRACT | Status: DC | PRN

## 2016-03-15 MED ORDER — ENOXAPARIN SODIUM 30 MG/0.3ML ~~LOC~~ SOLN
30.0000 mg | Freq: Every day | SUBCUTANEOUS | Status: DC
  Administered 2016-03-15: 30 mg via SUBCUTANEOUS
  Filled 2016-03-15: qty 0.3

## 2016-03-15 MED ORDER — ENOXAPARIN SODIUM 40 MG/0.4ML ~~LOC~~ SOLN
40.0000 mg | Freq: Every day | SUBCUTANEOUS | Status: DC
  Administered 2016-03-15: 04:00:00 40 mg via SUBCUTANEOUS
  Filled 2016-03-15: qty 0.4

## 2016-03-15 MED ORDER — SODIUM CHLORIDE 0.9 % IV SOLN: 250.0000 mL | INTRAVENOUS | Status: DC | PRN

## 2016-03-15 MED ORDER — IPRATROPIUM-ALBUTEROL 0.5-2.5 (3) MG/3ML IN SOLN
3.0000 mL | Freq: Four times a day (QID) | RESPIRATORY_TRACT | Status: DC
  Administered 2016-03-15 – 2016-03-18 (×13): 3 mL via RESPIRATORY_TRACT
  Filled 2016-03-15 (×14): qty 3

## 2016-03-15 MED ORDER — IOPAMIDOL (ISOVUE-370) INJECTION 76%
75.0000 mL | Freq: Once | INTRAVENOUS | Status: AC | PRN
  Administered 2016-03-15: 75 mL via INTRAVENOUS

## 2016-03-15 MED ORDER — AMLODIPINE BESYLATE 10 MG PO TABS
10.0000 mg | ORAL_TABLET | Freq: Every day | ORAL | Status: DC
  Administered 2016-03-15 – 2016-03-18 (×4): 10 mg via ORAL
  Filled 2016-03-15 (×4): qty 1

## 2016-03-15 MED ORDER — HYDRALAZINE HCL 25 MG PO TABS
25.0000 mg | ORAL_TABLET | Freq: Three times a day (TID) | ORAL | Status: DC
  Administered 2016-03-15 (×2): 25 mg via ORAL
  Filled 2016-03-15 (×2): qty 1

## 2016-03-15 MED ORDER — OXYCODONE-ACETAMINOPHEN 5-325 MG PO TABS
1.0000 | ORAL_TABLET | Freq: Four times a day (QID) | ORAL | Status: DC | PRN
  Administered 2016-03-15 – 2016-03-16 (×2): 1 via ORAL
  Filled 2016-03-15 (×3): qty 1

## 2016-03-15 MED ORDER — LIDOCAINE 5 % EX PTCH
1.0000 | MEDICATED_PATCH | CUTANEOUS | Status: DC
  Administered 2016-03-15 – 2016-03-18 (×4): 1 via TRANSDERMAL
  Filled 2016-03-15 (×4): qty 1

## 2016-03-15 MED ORDER — ONDANSETRON HCL 4 MG/2ML IJ SOLN
4.0000 mg | Freq: Four times a day (QID) | INTRAMUSCULAR | Status: DC | PRN
  Administered 2016-03-16: 23:00:00 4 mg via INTRAVENOUS
  Filled 2016-03-15: qty 2

## 2016-03-15 MED ORDER — METHYLPREDNISOLONE SODIUM SUCC 125 MG IJ SOLR
60.0000 mg | Freq: Four times a day (QID) | INTRAMUSCULAR | Status: DC
  Administered 2016-03-15: 05:00:00 60 mg via INTRAVENOUS
  Filled 2016-03-15: qty 2

## 2016-03-15 MED ORDER — SODIUM CHLORIDE 0.9% FLUSH
3.0000 mL | Freq: Two times a day (BID) | INTRAVENOUS | Status: DC
  Administered 2016-03-15 – 2016-03-18 (×8): 3 mL via INTRAVENOUS

## 2016-03-15 MED ORDER — SODIUM CHLORIDE 0.9% FLUSH: 3.0000 mL | INTRAVENOUS | Status: DC | PRN

## 2016-03-15 MED ORDER — IPRATROPIUM-ALBUTEROL 0.5-2.5 (3) MG/3ML IN SOLN
3.0000 mL | RESPIRATORY_TRACT | Status: DC | PRN
  Administered 2016-03-15 – 2016-03-18 (×2): 3 mL via RESPIRATORY_TRACT
  Filled 2016-03-15: qty 3

## 2016-03-15 NOTE — Progress Notes (Signed)
PHARMACIST - PHYSICIAN COMMUNICATION  CONCERNING:  Enoxaparin (Lovenox) for DVT Prophylaxis    RECOMMENDATION: Patient was prescribed enoxaprin 40mg  q24 hours for VTE prophylaxis.   Filed Weights   03/14/16 2200 03/15/16 0232  Weight: 128 lb (58.06 kg) 99 lb 14.4 oz (45.314 kg)    Body mass index is 17.14 kg/(m^2).  Estimated Creatinine Clearance: 34.8 mL/min (by C-G formula based on Cr of 0.62).   Based on [place] patient is candidate for enoxaparin 30mg  q24 hours based pn her most recent weight of 45kg  DESCRIPTION: Pharmacy has adjusted enoxaparin dose per [facility] policy.  Patient is now receiving enoxaparin 30mg  q24 hours.   Nancy Fetter, PharmD Clinical Pharmacist  03/15/2016 7:55 AM

## 2016-03-15 NOTE — H&P (Signed)
[place] [facility] [place] NAME: Janice Hoffman    MR#:  [id number]  DATE OF BIRTH:  03-Jan-1926  DATE OF ADMISSION:  03/14/2016  PRIMARY CARE PHYSICIAN: Madelyn Brunner, MD   REQUESTING/REFERRING PHYSICIAN:   CHIEF COMPLAINT:   Chief Complaint  Patient presents with  . Shortness of Breath  . Anxiety    HISTORY OF PRESENT ILLNESS: Janice Hoffman  is a 82 y.o. female with a known history of COPD not on any home oxygen, high blood pressure not on any medication presented to the emergency room with increased shortness of breath which is been going on for the last few weeks. The shortness of breath wasn't yesterday evening. Patient does not use any oxygen at home and she says she has been diagnosed with COPD in the past. She is on breathing and inhalation treatments at home. No history of travel or sick contacts at home. No history of any fever, chills. No history of any cough. She was worked up with chest x-ray which showed no evidence of any pneumonia or infection. Patient was given breathing treatments in the emergency room and was put on oxygen via nasal cannula. No history of headache dizziness or blurry vision. No history of syncope or seizure. Hospitalist service was consulted for further care of the patient.  PAST MEDICAL HISTORY:   Past Medical History  Diagnosis Date  . COPD (chronic obstructive pulmonary disease) ([place])   . Herpes zoster   . Neuralgia     PAST SURGICAL HISTORY: Past Surgical History  Procedure Laterality Date  . Leg surgery      SOCIAL HISTORY:  Social History  Substance Use Topics  . Smoking status: Never Smoker   . Smokeless tobacco: Not on file  . Alcohol Use: No    FAMILY HISTORY:  Family History  Problem Relation Age of Onset  . COPD Sister     DRUG ALLERGIES:  Allergies  Allergen Reactions  . Nsaids Shortness Of Breath    Throat swelling, shortness of breath  . Penicillins   . Promethazine Hcl      REVIEW OF SYSTEMS:   CONSTITUTIONAL: No fever, has weakness.  EYES: No blurred or double vision.  EARS, NOSE, AND THROAT: No tinnitus or ear pain.  RESPIRATORY: No cough, has shortness of breath, has wheezing , no hemoptysis noted GASTROINTESTINAL: No nausea, vomiting, diarrhea or abdominal pain.  GENITOURINARY: No dysuria, hematuria.  ENDOCRINE: No polyuria, nocturia,  HEMATOLOGY: No anemia, easy bruising or bleeding SKIN: No rash or lesion. MUSCULOSKELETAL: No joint pain or arthritis.   NEUROLOGIC: No tingling, numbness, weakness.  PSYCHIATRY: No anxiety or depression.   MEDICATIONS AT HOME:  Prior to Admission medications   Medication Sig Start Date End Date Taking? Authorizing Provider  acetaminophen (TYLENOL) 325 MG tablet Take 650 mg by mouth every 6 (six) hours as needed.   Yes Historical Provider, MD  albuterol (PROVENTIL HFA;VENTOLIN HFA) 108 (90 Base) MCG/ACT inhaler Inhale 2 puffs into the lungs every 6 (six) hours as needed for wheezing or shortness of breath.   Yes Historical Provider, MD  albuterol (PROVENTIL) (5 MG/ML) 0.5% nebulizer solution Take 2.5 mg by nebulization every 6 (six) hours as needed for wheezing or shortness of breath.   Yes Historical Provider, MD  levalbuterol Penne Lash) 0.63 MG/3ML nebulizer solution Take 0.63 mg by nebulization every 4 (four) hours as needed for wheezing or shortness of breath.   Yes Historical Provider, MD  polyethylene  glycol (MIRALAX / GLYCOLAX) packet Take 17 g by mouth daily as needed.   Yes Historical Provider, MD  traMADol (ULTRAM) 50 MG tablet Take 50 mg by mouth every 6 (six) hours as needed. Reported on 03/14/2016   Yes Historical Provider, MD      PHYSICAL EXAMINATION:   VITAL SIGNS: Blood pressure 215/107, pulse 87, temperature 98.1 F (36.7 C), temperature source Oral, resp. rate 22, height 5\' 4"  (1.626 m), weight 58.06 kg (128 lb), SpO2 98 %.  GENERAL:  82 y.o.-year-old patient lying in the bed with no acute  distress.  EYES: Pupils equal, round, reactive to light and accommodation. No scleral icterus. Extraocular muscles intact.  HEENT: Head atraumatic, normocephalic. Oropharynx and nasopharynx clear.  NECK:  Supple, no jugular venous distention. No thyroid enlargement, no tenderness.  LUNGS: Decreased breath sounds bilaterally, wheezing noted in both lung fields. No use of accessory muscles of respiration.  CARDIOVASCULAR: S1, S2 normal. No murmurs, rubs, or gallops.  ABDOMEN: Soft, nontender, nondistended. Bowel sounds present. No organomegaly or mass.  EXTREMITIES: No pedal edema, cyanosis, or clubbing.  NEUROLOGIC: Cranial nerves II through XII are intact. Muscle strength 5/5 in all extremities. Sensation intact. Gait normal. PSYCHIATRIC: The patient is alert and oriented x 3.  SKIN: No obvious rash, lesion, or ulcer.   LABORATORY PANEL:   CBC  Recent Labs Lab 03/14/16 2213  WBC 4.9  HGB 14.1  HCT 41.0  PLT 182  MCV 88.9  MCH 30.6  MCHC 34.4  RDW 13.6   ------------------------------------------------------------------------------------------------------------------  Chemistries   Recent Labs Lab 03/14/16 2213  NA 133*  K 4.0  CL 96*  CO2 28  GLUCOSE 119*  BUN 12  CREATININE 0.52  CALCIUM 9.3  AST 26  ALT 16  ALKPHOS 44  BILITOT 0.5   ------------------------------------------------------------------------------------------------------------------ estimated creatinine clearance is 42 mL/min (by C-G formula based on Cr of 0.52). ------------------------------------------------------------------------------------------------------------------ No results for input(s): TSH, T4TOTAL, T3FREE, THYROIDAB in the last 72 hours.  Invalid input(s): FREET3   Coagulation profile No results for input(s): INR, PROTIME in the last 168 hours. ------------------------------------------------------------------------------------------------------------------- No results for  input(s): DDIMER in the last 72 hours. -------------------------------------------------------------------------------------------------------------------  Cardiac Enzymes  Recent Labs Lab 03/14/16 2213  TROPONINI <0.03   ------------------------------------------------------------------------------------------------------------------ Invalid input(s): POCBNP  ---------------------------------------------------------------------------------------------------------------  Urinalysis    Component Value Date/Time   COLORURINE YELLOW 06/27/2010 [place] 06/27/2010 1353   LABSPEC 1.006 06/27/2010 1353   PHURINE 7.0 06/27/2010 1353   GLUCOSEU NEGATIVE 06/27/2010 1353   HGBUR NEGATIVE 06/27/2010 1353   BILIRUBINUR NEGATIVE 06/27/2010 1353   KETONESUR NEGATIVE 06/27/2010 1353   PROTEINUR NEGATIVE 06/27/2010 1353   UROBILINOGEN 0.2 06/27/2010 1353   NITRITE NEGATIVE 06/27/2010 1353   LEUKOCYTESUR SMALL* 06/27/2010 1353     RADIOLOGY: Dg Chest 2 View  03/14/2016  CLINICAL DATA:  Shortness of breath, cough and wheezing. EXAM: CHEST  2 VIEW COMPARISON:  01/25/2015 FINDINGS: Stable hyperinflation. Cardiomediastinal contours are unchanged with tortuosity and atherosclerosis of the thoracic aorta. No focal consolidation. Asymmetric infrahilar markings, increased on the right, unchanged from prior and may be secondary to scoliosis. There is no pulmonary edema, pleural fluid or pneumothorax. No acute osseous abnormality is seen. Scoliosis of the lower thoracic and lumbar spine. IMPRESSION: 1. Stable hyperinflation without superimposed acute process. 2. Aortic atherosclerosis. Electronically Signed   By: Jeb Levering M.D.   On: 03/14/2016 23:00    EKG: Orders placed or performed during the hospital encounter of 03/14/16  . EKG 12-Lead  .  EKG 12-Lead    IMPRESSION AND PLAN: 82 year old elderly female patient with history of COPD, high blood pressure not on any medication  resented to the emergency room with increased shortness of breath and wheezing. Admitting diagnosis 1. Acute COPD exacerbation 2. Dyspnea 3. Uncontrolled hypertension 4. Mild hyponatremia Treatment plan Admit patient to medical floor IV Solu-Medrol 60 mg every 6 hourly Dear nebulizations around the clock Control blood pressure with oral hydralazine and IV hydralazine as needed DVT prophylaxis with subcutaneous Lovenox Oxygen via nasal cannula at 2 L Supportive care. All the records are reviewed and case discussed with ED provider. Management plans discussed with the patient, family and they are in agreement.  CODE STATUS:FULL Code Status History    This patient does not have a recorded code status. Please follow your organizational policy for patients in this situation.       TOTAL TIME TAKING CARE OF THIS PATIENT: 50 minutes.    Saundra Shelling M.D on 03/15/2016 at 12:44 AM  Between 7am to 6pm - Pager - [phone]  After 6pm go to [email or web address] - password [email or web address] [facility]  Office  [phone]  CC: Primary care physician; Madelyn Brunner, MD

## 2016-03-15 NOTE — Care Management (Signed)
Admitted to [facility] with the diagnosis of COPD exacerbation. Lives alone. Hard of hearing. Son is Suhaani Kempel [phone]). No home health. No skilled facility.No home oxygen. Takes care of all basic and instrumental activities of daily living herself, drives. No falls. Excellent appetite. Prescriptions are fill at [facility]. Son or daughter-in-law Izora Gala) will transport. Shelbie Ammons RN MSN CCM Care Management [phone]

## 2016-03-15 NOTE — Progress Notes (Addendum)
[place] [facility] [place] NAME: Janice Hoffman    MR#:  [id number]  [place]:  [birth year]  SUBJECTIVE:   patient wants To go home but on ambulation she is very short of breath  REVIEW OF SYSTEMS:    Review of Systems  Constitutional: Negative for fever, chills and malaise/fatigue.  HENT: Negative for ear discharge, ear pain, hearing loss, nosebleeds and sore throat.   Eyes: Negative for blurred vision and pain.  Respiratory: Negative for cough, hemoptysis, shortness of breath and wheezing.   Cardiovascular: Negative for chest pain, palpitations and leg swelling.  Gastrointestinal: Negative for nausea, vomiting, abdominal pain, diarrhea and blood in stool.  Genitourinary: Negative for dysuria.  Musculoskeletal: Negative for back pain.  Neurological: Negative for dizziness, tremors, speech change, focal weakness, seizures and headaches.  Endo/Heme/Allergies: Does not bruise/bleed easily.  Psychiatric/Behavioral: Negative for depression, suicidal ideas and hallucinations.    Tolerating Diet:yes      DRUG ALLERGIES:   Allergies  Allergen Reactions  . Nsaids Shortness Of Breath    Throat swelling, shortness of breath  . Penicillins   . Promethazine Hcl     VITALS:  Blood pressure 131/54, pulse 91, temperature 98.2 F (36.8 C), temperature source Oral, resp. rate 18, height 5\' 4"  (1.626 m), weight 45.314 kg (99 lb 14.4 oz), SpO2 94 %.  PHYSICAL EXAMINATION:   Physical Exam  Constitutional: She is oriented to person, place, and time and well-developed, well-nourished, and in no distress. No distress.  HENT:  Head: Normocephalic.  Eyes: No scleral icterus.  Neck: Normal range of motion. Neck supple. No JVD present. No tracheal deviation present.  Cardiovascular: Regular rhythm and normal heart sounds.  Exam reveals no gallop and no friction rub.   No murmur heard. tachycardia  Pulmonary/Chest: Effort normal. No respiratory  distress. She has wheezes. She has no rales. She exhibits no tenderness.  Abdominal: Soft. Bowel sounds are normal. She exhibits no distension and no mass. There is no tenderness. There is no rebound and no guarding.  Musculoskeletal: Normal range of motion. She exhibits no edema.  Neurological: She is alert and oriented to person, place, and time.  Skin: Skin is warm. No rash noted. No erythema.  Psychiatric: Affect and judgment normal.      LABORATORY PANEL:   CBC  Recent Labs Lab 03/15/16 0510  WBC 4.1  HGB 13.2  HCT 36.9  PLT 189   ------------------------------------------------------------------------------------------------------------------  Chemistries   Recent Labs Lab 03/14/16 2213 03/15/16 0510  NA 133* 135  K 4.0 3.7  CL 96* 100*  CO2 28 28  GLUCOSE 119* 143*  BUN 12 13  CREATININE 0.52 0.62  CALCIUM 9.3 9.2  AST 26  --   ALT 16  --   ALKPHOS 44  --   BILITOT 0.5  --    ------------------------------------------------------------------------------------------------------------------  Cardiac Enzymes  Recent Labs Lab 03/14/16 2213  TROPONINI <0.03   ------------------------------------------------------------------------------------------------------------------  RADIOLOGY:  Dg Chest 2 View  03/14/2016  CLINICAL DATA:  Shortness of breath, cough and wheezing. EXAM: CHEST  2 VIEW COMPARISON:  01/25/2015 FINDINGS: Stable hyperinflation. Cardiomediastinal contours are unchanged with tortuosity and atherosclerosis of the thoracic aorta. No focal consolidation. Asymmetric infrahilar markings, increased on the right, unchanged from prior and may be secondary to scoliosis. There is no pulmonary edema, pleural fluid or pneumothorax. No acute osseous abnormality is seen. Scoliosis of the lower thoracic and lumbar spine. IMPRESSION: 1. Stable hyperinflation without superimposed acute process.  2. Aortic atherosclerosis. Electronically Signed   By: Jeb Levering M.D.   On: 03/14/2016 23:00     ASSESSMENT AND PLAN:   82 year old female with history of COPD who presents with shortness of breath.  1. Acute COPD exacerbation: Patient continues to have wheezing on examination as well as dyspnea exertion. Continue IV steroids, nebulizers and inhaler. Check CT r/p PE 2. Uncontrolled hypertension: Patient reports she stopped blood pressure medication several years ago. Continue norvasc.   Management plans discussed with the patient and she is in agreement.  CODE STATUS: full  TOTAL TIME TAKING CARE OF THIS PATIENT: 35 minutes.   Seen at 8:15-8:33  POSSIBLE D/C 2 days, DEPENDING ON CLINICAL CONDITION.   Rey Dansby M.D on 03/15/2016 at 11:15 AM  Between 7am to 6pm - Pager - [phone] After 6pm go to [email or web address] - password [email or web address] [facility]  Office  [phone]  CC: Primary care physician; Madelyn Brunner, MD  Note: This dictation was prepared with Dragon dictation along with smaller phrase technology. Any transcriptional errors that result from this process are unintentional.

## 2016-03-15 NOTE — Progress Notes (Signed)
PT Cancellation Note  Patient Details Name: KARLIA SCHLAEFER MRN: [id number] DOB: 24-Sep-1925   Cancelled Treatment:    Reason Eval/Treat Not Completed: Medical issues which prohibited therapy;Other (comment) Consult received, chart reviewed, inappropriate for therapy secondary to imaging for PE. Will re-attempt when medically stable.   Jourdin Gens 03/15/2016, 11:49 AM Burnett Corrente, SPT [phone]

## 2016-03-15 NOTE — Progress Notes (Signed)
Physical Therapy Evaluation Patient Details Name: Janice Hoffman MRN: [id number] DOB: 1926/01/22 Today's Date: 03/15/2016   History of Present Illness  Pt presents with COPD exacerbation, SOB, and general weakness and fatigue. Pt has negative PE imaging.   Clinical Impression  Pt is a pleasant and cooperative 82 y/o female admitted for COPD exacerbation. She presents with SOB and general fatigue/weakness. She is independent with med mobility; requires CGA with transfers, and CGA with ambulation. Pt began ambulating with hand-held assist, however due to decreased balance she will need RW to decrease fall risk. She is able to walk short distances, (<50'), and maintains Sp02 in low 90s. Pt shows good PT potential and is appropriate for skilled therapy at this time due to balance, strength, endurance, and coordination deficits. Recommend she receives OP therapy to address deficits.    Follow Up Recommendations Outpatient PT    Equipment Recommendations  Rolling walker with 5" wheels    Recommendations for Other Services       Precautions / Restrictions Precautions Precautions: None Restrictions Weight Bearing Restrictions: No      Mobility  Bed Mobility Overal bed mobility: Independent             General bed mobility comments: Pt is able to move from supine-to-sit independently with moderate effort.   Transfers Overall transfer level: Modified independent Equipment used: 1 person hand held assist             General transfer comment: Pt had no difficulty moving from sit-to-stand from bed in raised position, however, pt was more comfortable with hend-held/CGA.   Ambulation/Gait Ambulation/Gait assistance: Min guard Ambulation Distance (Feet): 20 Feet Assistive device: None Gait Pattern/deviations: Decreased stride length;Trunk flexed     General Gait Details: Pt is able to walk safely with CGA. Walk speed is slow with forward trunk posture, wide BOS, and reduced  stride length.  Stairs            Wheelchair Mobility    Modified Rankin (Stroke Patients Only)       Balance Overall balance assessment: Modified Independent (Pt. has increased sway in static stance. )                                           Pertinent Vitals/Pain Pain Assessment:  (Reports pain in L knee susequent to a Fx s/p ~33yrs)    Home Living Family/patient expects to be discharged to:: Private residence Living Arrangements: Alone                    Prior Function Level of Independence: Independent               Hand Dominance        Extremity/Trunk Assessment   Upper Extremity Assessment: Overall WFL for tasks assessed           Lower Extremity Assessment: Overall WFL for tasks assessed      Cervical / Trunk Assessment: Kyphotic  Communication   Communication: No difficulties  Cognition Arousal/Alertness: Awake/alert Behavior During Therapy: WFL for tasks assessed/performed Overall Cognitive Status: Within Functional Limits for tasks assessed                      General Comments      Exercises Other Exercises Other Exercises:  (Pt ambulated with better mechanics and confidence with RW.) Other  Exercises: B LE SLR in supine X 10 reps independently      Assessment/Plan    PT Assessment Patient needs continued PT services  PT Diagnosis Generalized weakness   PT Problem List Decreased strength;Decreased range of motion;Decreased activity tolerance;Decreased balance;Decreased mobility;Pain;Cardiopulmonary status limiting activity  PT Treatment Interventions Gait training;Stair training;Functional mobility training;Therapeutic exercise;Balance training   PT Goals (Current goals can be found in the Care Plan section) Acute Rehab PT Goals Patient Stated Goal: Go home PT Goal Formulation: With patient Time For Goal Achievement: 03/29/16 Potential to Achieve Goals: Good Additional Goals Additional  Goal #1: Pt will be able to perform 8-10 minutes of balance activities in order to ambulate independently with reduced risk of falls.    Frequency Min 2X/week   Barriers to discharge        Co-evaluation               End of Session Equipment Utilized During Treatment: Gait belt;Oxygen Activity Tolerance: Patient limited by fatigue;Patient limited by pain Patient left: in bed;with call bell/phone within reach Nurse Communication: Mobility status         Time: 1425-1449 PT Time Calculation (min) (ACUTE ONLY): 24 min   Charges:   PT Evaluation $PT Eval Low Complexity: 1 Procedure PT Treatments $Gait Training: 8-22 mins   PT G Codes:        Kemiya Batdorf Mar 17, 2016, 5:05 PM Burnett Corrente, SPT [phone]

## 2016-03-15 NOTE — Progress Notes (Signed)
Dr Benjie Karvonen made aware that CT result in, states that she will look at result

## 2016-03-15 NOTE — Progress Notes (Signed)
Dr Benjie Karvonen made aware that pts left knee area continues to hurt, pt requesting her home percocet be ordered, new order for percocet placed

## 2016-03-16 LAB — BASIC METABOLIC PANEL
Anion gap: 7 (ref 5–15)
BUN: 24 mg/dL — AB (ref 6–20)
CO2: 29 mmol/L (ref 22–32)
CREATININE: 0.58 mg/dL (ref 0.44–1.00)
Calcium: 9.6 mg/dL (ref 8.9–10.3)
Chloride: 94 mmol/L — ABNORMAL LOW (ref 101–111)
Glucose, Bld: 130 mg/dL — ABNORMAL HIGH (ref 65–99)
Potassium: 4.5 mmol/L (ref 3.5–5.1)
SODIUM: 130 mmol/L — AB (ref 135–145)

## 2016-03-16 MED ORDER — METHYLPREDNISOLONE SODIUM SUCC 40 MG IJ SOLR
40.0000 mg | Freq: Two times a day (BID) | INTRAMUSCULAR | Status: DC
  Administered 2016-03-16 – 2016-03-18 (×4): 40 mg via INTRAVENOUS
  Filled 2016-03-16 (×6): qty 1

## 2016-03-16 MED ORDER — AMLODIPINE BESYLATE 10 MG PO TABS: 10.0000 mg | ORAL_TABLET | Freq: Every day | ORAL | Status: DC

## 2016-03-16 MED ORDER — MORPHINE SULFATE (PF) 2 MG/ML IV SOLN
2.0000 mg | INTRAVENOUS | Status: DC | PRN
  Administered 2016-03-16 – 2016-03-17 (×3): 2 mg via INTRAVENOUS
  Filled 2016-03-16 (×3): qty 1

## 2016-03-16 MED ORDER — ENOXAPARIN SODIUM 40 MG/0.4ML ~~LOC~~ SOLN
40.0000 mg | Freq: Every day | SUBCUTANEOUS | Status: DC
  Administered 2016-03-16: 21:00:00 40 mg via SUBCUTANEOUS
  Filled 2016-03-16 (×2): qty 0.4

## 2016-03-16 MED ORDER — LIDOCAINE 5 % EX PTCH: 1.0000 | MEDICATED_PATCH | CUTANEOUS | Status: DC

## 2016-03-16 MED ORDER — LEVOFLOXACIN IN D5W 750 MG/150ML IV SOLN
750.0000 mg | INTRAVENOUS | Status: DC
  Administered 2016-03-18: 750 mg via INTRAVENOUS
  Filled 2016-03-16: qty 150

## 2016-03-16 MED ORDER — OXYCODONE-ACETAMINOPHEN 5-325 MG PO TABS
1.0000 | ORAL_TABLET | ORAL | Status: DC | PRN
  Administered 2016-03-16: 1 via ORAL
  Filled 2016-03-16: qty 1

## 2016-03-16 MED ORDER — LEVOFLOXACIN 750 MG PO TABS: 750.0000 mg | ORAL_TABLET | Freq: Every day | ORAL | Status: DC

## 2016-03-16 MED ORDER — PREDNISONE 10 MG PO TABS: 10.0000 mg | ORAL_TABLET | Freq: Every day | ORAL | Status: DC

## 2016-03-16 MED ORDER — LEVOFLOXACIN IN D5W 750 MG/150ML IV SOLN
750.0000 mg | INTRAVENOUS | Status: DC
  Administered 2016-03-16: 11:00:00 750 mg via INTRAVENOUS
  Filled 2016-03-16 (×2): qty 150

## 2016-03-16 NOTE — Progress Notes (Signed)
Patient Saturations on Room Air at Rest = 99% Patient Saturations on [employer] while Ambulating = 98%   But states she still feels like she cant catch her breath and is SOB, Dr Benjie Karvonen made aware

## 2016-03-16 NOTE — Progress Notes (Addendum)
[facility] [place] NAME: Janice Hoffman    MR#:  [id number]  DATE OF BIRTH:  Jul 22, 1926  SUBJECTIVE:   doing better this am but still SOB on ambulation  REVIEW OF SYSTEMS:    Review of Systems  Constitutional: Negative for fever, chills and malaise/fatigue.  HENT: Negative for ear discharge, ear pain, hearing loss, nosebleeds and sore throat.   Eyes: Negative for blurred vision and pain.  Respiratory: Negative for cough, hemoptysis, shortness of breath and wheezing.   Cardiovascular: Negative for chest pain, palpitations and leg swelling.  Gastrointestinal: Negative for nausea, vomiting, abdominal pain, diarrhea and blood in stool.  Genitourinary: Negative for dysuria.  Musculoskeletal: Negative for back pain.  Neurological: Negative for dizziness, tremors, speech change, focal weakness, seizures and headaches.  Endo/Heme/Allergies: Does not bruise/bleed easily.  Psychiatric/Behavioral: Negative for depression, suicidal ideas and hallucinations.    Tolerating Diet:yes      DRUG ALLERGIES:   Allergies  Allergen Reactions  . Nsaids Shortness Of Breath    Throat swelling, shortness of breath  . Penicillins   . Promethazine Hcl     VITALS:  Blood pressure 150/69, pulse 95, temperature 98.2 F (36.8 C), temperature source Oral, resp. rate 18, height 5\' 4"  (1.626 m), weight 45.314 kg (99 lb 14.4 oz), SpO2 95 %.  PHYSICAL EXAMINATION:   Physical Exam  Constitutional: She is oriented to person, place, and time and well-developed, well-nourished, and in no distress. No distress.  HENT:  Head: Normocephalic.  Eyes: No scleral icterus.  Neck: Normal range of motion. Neck supple. No JVD present. No tracheal deviation present.  Cardiovascular: Regular rhythm and normal heart sounds.  Exam reveals no gallop and no friction rub.   No murmur heard. tachycardia  Pulmonary/Chest: Effort normal. No respiratory distress. She has wheezes.  She has no rales. She exhibits no tenderness.  Abdominal: Soft. Bowel sounds are normal. She exhibits no distension and no mass. There is no tenderness. There is no rebound and no guarding.  Musculoskeletal: Normal range of motion. She exhibits no edema.  Neurological: She is alert and oriented to person, place, and time.  Skin: Skin is warm. No rash noted. No erythema.  Psychiatric: Affect and judgment normal.      LABORATORY PANEL:   CBC  Recent Labs Lab 03/15/16 0510  WBC 4.1  HGB 13.2  HCT 36.9  PLT 189   ------------------------------------------------------------------------------------------------------------------  Chemistries   Recent Labs Lab 03/14/16 2213  03/16/16 0749  NA 133*  < > 130*  K 4.0  < > 4.5  CL 96*  < > 94*  CO2 28  < > 29  GLUCOSE 119*  < > 130*  BUN 12  < > 24*  CREATININE 0.52  < > 0.58  CALCIUM 9.3  < > 9.6  AST 26  --   --   ALT 16  --   --   ALKPHOS 44  --   --   BILITOT 0.5  --   --   < > = values in this interval not displayed. ------------------------------------------------------------------------------------------------------------------  Cardiac Enzymes  Recent Labs Lab 03/14/16 2213  TROPONINI <0.03   ------------------------------------------------------------------------------------------------------------------  RADIOLOGY:  Dg Chest 2 View  03/14/2016  CLINICAL DATA:  Shortness of breath, cough and wheezing. EXAM: CHEST  2 VIEW COMPARISON:  01/25/2015 FINDINGS: Stable hyperinflation. Cardiomediastinal contours are unchanged with tortuosity and atherosclerosis of the thoracic aorta. No focal consolidation. Asymmetric infrahilar markings, increased on the right,  unchanged from prior and may be secondary to scoliosis. There is no pulmonary edema, pleural fluid or pneumothorax. No acute osseous abnormality is seen. Scoliosis of the lower thoracic and lumbar spine. IMPRESSION: 1. Stable hyperinflation without superimposed  acute process. 2. Aortic atherosclerosis. Electronically Signed   By: Jeb Levering M.D.   On: 03/14/2016 23:00   Ct Angio Chest Pe W Or Wo Contrast  03/15/2016  CLINICAL DATA:  Shortness of Breath EXAM: CT ANGIOGRAPHY CHEST WITH CONTRAST TECHNIQUE: Multidetector CT imaging of the chest was performed using the standard protocol during bolus administration of intravenous contrast. Multiplanar CT image reconstructions and MIPs were obtained to evaluate the vascular anatomy. CONTRAST:  75 mL Isovue 370 nonionic COMPARISON:  Chest radiograph March 14, 2016 FINDINGS: Cardiovascular: There is no demonstrable pulmonary embolus. There is atherosclerotic calcification throughout the thoracic aorta. The ascending thoracic aorta has a transverse diameter of 4.1 x 4.1 cm. No dissection is apparent. Aorta is somewhat tortuous. There are scattered foci of calcification in the proximal great vessels. Visualized great vessels otherwise appear unremarkable. There is extensive coronary artery calcification. There is mild left ventricular hypertrophy. Pericardium is not appreciably thickened. Mediastinum/Nodes: The thyroid is enlarged with multiple nodular lesions throughout the thyroid. Benign-appearing calcification is seen on the right. There is an apparent dominant mass arising from the left lobe of the thyroid measuring 2.0 x 1.7 cm. There are scattered subcentimeter lymph nodes in the thoracic region. There is a right hilar lymph node measuring 1.1 x 1.0 cm. There is a lymph node just anterior to the proximal left main bronchus measuring 1.2 x 1.2 cm. There is a small hiatal hernia. Lungs/Pleura: There is a degree of underlying centrilobular emphysematous change. There is patchy airspace consolidation in the posterior segment of the left lower lobe. There are scattered areas of scarring and peripheral fibrotic type change, most notably in the right middle and right lower lobes peripherally with similar changes to a lesser  degree in the left lower lobe. There is a second area of patchy infiltrate in the anterior segment of the left upper lobe slightly to the left of the anterior mediastinum. There is a slightly irregular in contour a smaller semi-solid nodular opacity in the posterior segment of the right upper lobe near the apex measures 4 x 4 mm and is seen on axial slice 19 series 6. Several tree on but did appearing lesions are noted in the anterior segment left upper lobe slightly superior to the area of consolidation described adjacent to the anterior mediastinum in the left upper lobe. Semi-solid opacity in the right apex measuring 9 x 7 mm, best seen on axial slice 14 series 6. Upper Abdomen: There is extensive atherosclerotic calcification in the visualized upper abdomen. A small granuloma is noted in the liver near the apex. There is a 7 mm cyst in the medial segment of the left lobe of the liver. Visualized upper abdominal structures otherwise appear unremarkable. Musculoskeletal: There is degenerative change in the thoracic and upper lumbar spine regions. Disc degeneration is most severe in the upper lumbar regions. There is upper lumbar levoscoliosis with lower thoracic dextroscoliosis. There are no blastic or lytic bone lesions. Review of the MIP images confirms the above findings. IMPRESSION: No pulmonary embolus evident. Ascending thoracic aorta prominence with transverse diameter of 4.1 x 4.1 cm. Recommend annual imaging followup by CTA or MRA. This recommendation follows 2010 ACCF/AHA/AATS/ACR/ASA/SCA/SCAI/SIR/STS/SVM Guidelines for the Diagnosis and Management of Patients with Thoracic Aortic Disease. Circulation. 2010; 121:  [id number]. Extensive atherosclerotic calcification in the aorta. Multiple coronary artery calcifications noted. Underlying centrilobular emphysema. Areas of pneumonia in the left upper and left lower lobes. Areas of peripheral fibrosis in the lower lobes and right middle lobe peripherally. There  are semi-solid opacities in the right upper lobe, the largest measuring 9 x 7 mm. This semi-solid opacity is slightly irregular in contour. Non-contrast chest CT at 3-6 months is recommended. If nodules persist, subsequent management will be based upon the most suspicious nodule(s). This recommendation follows the consensus statement: Guidelines for Management of Incidental Pulmonary Nodules Detected on CT Images:From the Fleischner Society 2017; published online before print (10.1148/radiol.IJ:2314499). Borderline prominent lymph nodes as noted above, likely of reactive/inflammatory etiology. Electronically Signed   By: Lowella Grip III M.D.   On: 03/15/2016 12:26     ASSESSMENT AND PLAN:   82 year old female with history of COPD who presents with shortness of breath.  1. Acute COPD exacerbation/CAP :  Continue IV steroids, nebulizers and inhaler. Start Levaquin for CAP.  2. Uncontrolled hypertension: Patient reports she stopped blood pressure medication several years ago. Continue norvasc.  3 hyponatremia: possibly from COPD?CAP Recheck in am.   Management plans discussed with the patient  And son and she is in agreement.  CODE STATUS: full  TOTAL TIME TAKING CARE OF THIS PATIENT: 29 minutes.     POSSIBLE D/C 1-2 days, DEPENDING ON CLINICAL CONDITION.   Derrick Tiegs M.D on 03/16/2016 at 10:08 AM  Between 7am to 6pm - Pager - [phone] After 6pm go to [email or web address] - password [email or web address] [facility]  Office  [phone]  CC: Primary care physician; Madelyn Brunner, MD  Note: This dictation was prepared with Dragon dictation along with smaller phrase technology. Any transcriptional errors that result from this process are unintentional.

## 2016-03-16 NOTE — Consult Note (Signed)
Pharmacy Antibiotic Note  Janice Hoffman is a 82 y.o. female admitted on 03/14/2016 with pneumonia.  Pharmacy has been consulted for levofloxacin dosing.  Plan: levofloxacin 750mg  q 48 hours due to renal function  Height: 5\' 4"  (162.6 cm) Weight: 99 lb 14.4 oz (45.314 kg) IBW/kg (Calculated) : 54.7  Temp (24hrs), Avg:98 F (36.7 C), Min:97.8 F (36.6 C), Max:98.2 F (36.8 C)   Recent Labs Lab 03/14/16 2213 03/15/16 0510 03/16/16 0749  WBC 4.9 4.1  --   CREATININE 0.52 0.62 0.58    Estimated Creatinine Clearance: 34.8 mL/min (by C-G formula based on Cr of 0.58).    Allergies  Allergen Reactions  . Nsaids Shortness Of Breath    Throat swelling, shortness of breath  . Penicillins   . Promethazine Hcl      Thank you for allowing pharmacy to be a part of this patient's care.  Ramond Dial 03/16/2016 5:25 PM

## 2016-03-16 NOTE — Discharge Summary (Signed)
Janice Hoffman NAME: Janice Hoffman    MR#:  [id number]  DATE OF BIRTH:  May 05, 1926  DATE OF ADMISSION:  03/14/2016 ADMITTING PHYSICIAN: Saundra Shelling, MD  DATE OF DISCHARGE: 03/18/2016  PRIMARY CARE PHYSICIAN: Madelyn Brunner, MD    ADMISSION DIAGNOSIS:  Dyspnea [R06.00] Asthma, unspecified asthma severity, uncomplicated [id number]  DISCHARGE DIAGNOSIS:  Principal Problem:   COPD exacerbation ([place])   SECONDARY DIAGNOSIS:   Past Medical History  Diagnosis Date  . COPD (chronic obstructive pulmonary disease) ([place])   . Herpes zoster   . Neuralgia   . Asthma   . Hypertension     HOSPITAL COURSE:   82 year old female with history of COPD who presents with shortness of breath.  1. Acute COPD exacerbation and CAP: She was started on IV steroids, nebulizer and inhalers. She has improved with this therapy.  She is not requiring oxygen at discharge. She will need steroid taper discharge and Levaquin. CT scan showed central lobar emphysema with possible areas of pneumonia and fibrosis. There are also some irregular nodules seen on CT scan and it is recommended patient have repeat chest CT 3-6 months.  2. Uncontrolled hypertension: Patient reported she stopped blood pressure medication several years ago. She was started on Norvasc a lot of outpatient follow-up for blood pressure.   3. Hyponatremia: This is from a COPD exacerbation and PNA. 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  DISCHARGE CONDITIONS AND DIET:   Stable on heart healthy diet  CONSULTS OBTAINED:  Treatment Team:  Anthonette Legato, MD  DRUG ALLERGIES:   Allergies  Allergen Reactions  . Nsaids Shortness Of Breath    Throat swelling, shortness of breath  . Penicillins   . Promethazine Hcl     DISCHARGE MEDICATIONS:   Current Discharge Medication List    START taking these medications    Details  amLODipine (NORVASC) 10 MG tablet Take 1 tablet (10 mg total) by mouth daily. Qty: 30 tablet, Refills: 0    feeding supplement, ENSURE ENLIVE, (ENSURE ENLIVE) LIQD Take 237 mLs by mouth 2 (two) times daily between meals. Qty: 237 mL, Refills: 12    levofloxacin (LEVAQUIN) 750 MG tablet Take 1 tablet (750 mg total) by mouth daily. Qty: 5 tablet, Refills: 0    lidocaine (LIDODERM) 5 % Place 1 patch onto the skin daily. Remove & Discard patch within 12 hours or as directed by MD Qty: 30 patch, Refills: 0    Menthol-Methyl Salicylate (MUSCLE RUB) 10-15 % CREA Apply 1 application topically as needed for muscle pain. Qty: 35 g, Refills: 0    predniSONE (DELTASONE) 10 MG tablet Take 1 tablet (10 mg total) by mouth daily with breakfast. 60 mg PO (ORAL) x 2 days 50 mg PO (ORAL)  x 2 days 40 mg PO (ORAL)  x 2 days 30 mg PO  (ORAL)  x 2 days 20 mg PO  (ORAL) x 2 days 10 mg PO  (ORAL) x 2 days then stop Qty: 42 tablet, Refills: 0      CONTINUE these medications which have NOT CHANGED   Details  acetaminophen (TYLENOL) 325 MG tablet Take 650 mg by mouth every 6 (six) hours as needed.    albuterol (PROVENTIL HFA;VENTOLIN HFA) 108 (90 Base) MCG/ACT inhaler Inhale 2 puffs into the lungs every 6 (six) hours as needed for wheezing or shortness of breath.    albuterol (PROVENTIL) (  5 MG/ML) 0.5% nebulizer solution Take 2.5 mg by nebulization every 6 (six) hours as needed for wheezing or shortness of breath.    Fluticasone-Salmeterol (ADVAIR) 500-50 MCG/DOSE AEPB Inhale 1 puff into the lungs 2 (two) times daily.    levalbuterol (XOPENEX) 0.63 MG/3ML nebulizer solution Take 0.63 mg by nebulization every 4 (four) hours as needed for wheezing or shortness of breath.    oxyCODONE-acetaminophen (PERCOCET/ROXICET) 5-325 MG tablet Take 1 tablet by mouth every 6 (six) hours as needed for severe pain.    polyethylene glycol (MIRALAX / GLYCOLAX) packet Take 17 g by mouth daily as needed.     traMADol (ULTRAM) 50 MG tablet Take 50 mg by mouth every 6 (six) hours as needed. Reported on 03/14/2016              Today   CHIEF COMPLAINT:  Patient would like to go home today. Shortness of breath is resolving. She is anxious.  VITAL SIGNS:  Blood pressure 162/86, pulse 92, temperature 98.6 F (37 C), temperature source Oral, resp. rate 20, height 5\' 4"  (1.626 m), weight 45.314 kg (99 lb 14.4 oz), SpO2 98 %.   REVIEW OF SYSTEMS:  Review of Systems  Constitutional: Negative for fever, chills and malaise/fatigue.  HENT: Negative for ear discharge, ear pain, hearing loss, nosebleeds and sore throat.   Eyes: Negative for blurred vision and pain.  Respiratory: Negative for cough, hemoptysis, shortness of breath and wheezing.   Cardiovascular: Negative for chest pain, palpitations and leg swelling.  Gastrointestinal: Negative for nausea, vomiting, abdominal pain, diarrhea and blood in stool.  Genitourinary: Negative for dysuria.  Musculoskeletal: Negative for back pain.  Neurological: Negative for dizziness, tremors, speech change, focal weakness, seizures and headaches.  Endo/Heme/Allergies: Does not bruise/bleed easily.  Psychiatric/Behavioral: Negative for depression, suicidal ideas and hallucinations.     PHYSICAL EXAMINATION:  GENERAL:  82 y.o.-year-old patient lying in the bed with no acute distress.  NECK:  Supple, no jugular venous distention. No thyroid enlargement, no tenderness.  LUNGS: Normal breath sounds bilaterally, no wheezing, rales,rhonchi  No use of accessory muscles of respiration.  CARDIOVASCULAR: S1, S2 normal. No murmurs, rubs, or gallops.  ABDOMEN: Soft, non-tender, non-distended. Bowel sounds present. No organomegaly or mass.  EXTREMITIES: No pedal edema, cyanosis, or clubbing.  PSYCHIATRIC: The patient is alert and oriented x 3.  SKIN: No obvious rash, lesion, or ulcer.   DATA REVIEW:   CBC  Recent Labs Lab 03/15/16 0510  WBC 4.1  HGB  13.2  HCT 36.9  PLT 189    Chemistries   Recent Labs Lab 03/14/16 2213  03/18/16 0948  NA 133*  < > 128*  K 4.0  < > 4.9  CL 96*  < > 92*  CO2 28  < > 26  GLUCOSE 119*  < > 139*  BUN 12  < > 24*  CREATININE 0.52  < > 0.72  CALCIUM 9.3  < > 9.5  AST 26  --   --   ALT 16  --   --   ALKPHOS 44  --   --   BILITOT 0.5  --   --   < > = values in this interval not displayed.  Cardiac Enzymes  Recent Labs Lab 03/14/16 2213  TROPONINI <0.03    Microbiology Results  @[email or web address] @  RADIOLOGY:  No results found.    Management plans discussed with the patient and she is in agreement. Stable for discharge home with [facility]  Patient should follow  up with PCP 3-5 days  CODE STATUS:     Code Status Orders        Start     Ordered   03/15/16 0226  Full code   Continuous     03/15/16 0225    Code Status History    Date Active Date Inactive Code Status Order ID Comments User Context   This patient has a current code status but no historical code status.      TOTAL TIME TAKING CARE OF THIS PATIENT: 35 minutes.    Note: This dictation was prepared with Dragon dictation along with smaller phrase technology. Any transcriptional errors that result from this process are unintentional.  Truett Mcfarlan M.D on 03/18/2016 at 10:36 AM  Between 7am to 6pm - Pager - [phone] After 6pm go to [email or web address] - password [email or web address] [place] [facility]  Office  [phone]  CC: Primary care physician; Madelyn Brunner, MD

## 2016-03-16 NOTE — Progress Notes (Signed)
Pt complains of increased chronic pain in her left leg/knee from previous fracture, states that she would like to try something stronger than percocet for the pain, the pain is making her anxious and making her breathing worse, new order for morphine 2mg  every 3 hrs PRN for severe pain, and change percocet 1 tab every 4 hrs PRN per Dr Ether Griffins

## 2016-03-17 LAB — BASIC METABOLIC PANEL
ANION GAP: 7 (ref 5–15)
ANION GAP: 9 (ref 5–15)
BUN: 33 mg/dL — ABNORMAL HIGH (ref 6–20)
BUN: 31 mg/dL — ABNORMAL HIGH (ref 6–20)
CALCIUM: 9.2 mg/dL (ref 8.9–10.3)
CALCIUM: 9.3 mg/dL (ref 8.9–10.3)
CO2: 27 mmol/L (ref 22–32)
CO2: 26 mmol/L (ref 22–32)
CREATININE: 0.77 mg/dL (ref 0.44–1.00)
Chloride: 93 mmol/L — ABNORMAL LOW (ref 101–111)
Chloride: 93 mmol/L — ABNORMAL LOW (ref 101–111)
Creatinine, Ser: 0.75 mg/dL (ref 0.44–1.00)
GFR calc Af Amer: >60 mL/min (ref >60)
GFR calc Af Amer: >60 mL/min (ref >60)
GFR calc non Af Amer: >60 mL/min (ref >60)
GFR calc non Af Amer: >60 mL/min (ref >60)
GLUCOSE: 111 mg/dL — AB (ref 65–99)
GLUCOSE: 126 mg/dL — AB (ref 65–99)
Potassium: 4.9 mmol/L (ref 3.5–5.1)
Potassium: 4.4 mmol/L (ref 3.5–5.1)
Sodium: 127 mmol/L — ABNORMAL LOW (ref 135–145)
Sodium: 128 mmol/L — ABNORMAL LOW (ref 135–145)

## 2016-03-17 LAB — URIC ACID: URIC ACID, SERUM: 4 mg/dL (ref 2.3–6.6)

## 2016-03-17 LAB — OSMOLALITY, URINE: OSMOLALITY UR: 241 mosm/kg — AB (ref 300–900)

## 2016-03-17 LAB — SODIUM, URINE, RANDOM: Sodium, Ur: 46 mmol/L

## 2016-03-17 LAB — TSH: TSH: 0.565 u[IU]/mL (ref 0.350–4.500)

## 2016-03-17 LAB — OSMOLALITY: OSMOLALITY: 278 mosm/kg (ref 275–295)

## 2016-03-17 MED ORDER — SODIUM CHLORIDE 0.9 % IV SOLN
INTRAVENOUS | Status: DC
  Administered 2016-03-17: 08:00:00 via INTRAVENOUS

## 2016-03-17 MED ORDER — IPRATROPIUM-ALBUTEROL 0.5-2.5 (3) MG/3ML IN SOLN
3.0000 mL | Freq: Once | RESPIRATORY_TRACT | Status: AC
  Administered 2016-03-17: 11:00:00 3 mL via RESPIRATORY_TRACT
  Filled 2016-03-17: qty 3

## 2016-03-17 MED ORDER — BISACODYL 10 MG RE SUPP
10.0000 mg | Freq: Once | RECTAL | Status: AC
Start: 2016-03-17 — End: 2016-03-17
  Administered 2016-03-17: 19:00:00 10 mg via RECTAL
  Filled 2016-03-17: qty 1

## 2016-03-17 MED ORDER — FUROSEMIDE 10 MG/ML IJ SOLN
20.0000 mg | Freq: Once | INTRAMUSCULAR | Status: AC
  Administered 2016-03-17: 12:00:00 20 mg via INTRAVENOUS
  Filled 2016-03-17: qty 2

## 2016-03-17 NOTE — Progress Notes (Signed)
Patient refusing bed alarm , very upset that she cannot get up in her room without alarm going off. Patient is steady ion her feet but becomes very sob with any exertion. Educated patient on calling for assistance when she needs to get up, she states that she will, however patient is very forgetful and forgets conversations quickly.   Patient is very upset that I removed her lidocaine patch that had been on for 12 hours and would like another put on. I explained that they stay on for 12 hours then off for 12 hours, she wants something else for her leg. Spoke to dr Claria Dice she will order a medicated cream for her chronic leg pain since patient will not take morphine.   Patients son states that morphine sends patient on a roller coaster ride and would not like patient to take this if possible. He also thinks that she is having a lot of gas as her belly is distended, patient does not want to take anything for gas, did take miralax and a dulcolax suppository.

## 2016-03-17 NOTE — Plan of Care (Signed)
Problem: Pain Managment: Goal: General experience of comfort will improve Outcome: Progressing  Morphine given for L knee pain once with improvement.  Problem: Nutrition: Goal: Adequate nutrition will be maintained Outcome: Not Progressing Poor-fair appetite. Pt encouraged to eat and drink.  No BM since 03/13/2016 per previous documentation. Pt refuses laxative throughout the day, but agree to prune juice. No BM yet. Prior to shift change pt c/o feeling bloated, abd distended and taut. Denies nausea. MD notified and ordered enema. Pt refuses enema and requested laxative. MD notified, dulcolax supp ordered. Pt in agreement.  Problem: Respiratory: Goal: Ability to maintain a clear airway will improve Outcome: Progressing Pt sob with minimal exertion. O2 sats in the high 90's throughout the day. Pt c/o sob today while sitting on the chair, pt anxious at that time and c/o L knee pain. Bailey Mech, charge nurse notified Dr Benjie Karvonen. IVF d/c and SVN treatment given by respiratory therapist with improvement. Pt refused med for anxiety nor for pain at that time. Lasix IV given.

## 2016-03-17 NOTE — Progress Notes (Addendum)
[facility] NAME: Janice Hoffman    MR#:  [id number]  [facility] [facility]:  10-24-1925  SUBJECTIVE:   doing better this am but still with SOB when she walks  Eating well not drinking too much fluids  REVIEW OF SYSTEMS:    Review of Systems  Constitutional: Negative for fever, chills and malaise/fatigue.  HENT: Negative for ear discharge, ear pain, hearing loss, nosebleeds and sore throat.   Eyes: Negative for blurred vision and pain.  Respiratory: Negative for cough, hemoptysis, shortness of breath and wheezing.   Cardiovascular: Negative for chest pain, palpitations and leg swelling.  Gastrointestinal: Negative for nausea, vomiting, abdominal pain, diarrhea and blood in stool.  Genitourinary: Negative for dysuria.  Musculoskeletal: Negative for back pain.  Neurological: Negative for dizziness, tremors, speech change, focal weakness, seizures and headaches.  Endo/Heme/Allergies: Does not bruise/bleed easily.  Psychiatric/Behavioral: Negative for depression, suicidal ideas and hallucinations.    Tolerating Diet:yes      DRUG ALLERGIES:   Allergies  Allergen Reactions  . Nsaids Shortness Of Breath    Throat swelling, shortness of breath  . Penicillins   . Promethazine Hcl     VITALS:  Blood pressure 156/66, pulse 85, temperature 97.9 F (36.6 C), temperature source Oral, resp. rate 18, height 5\' 4"  (1.626 m), weight 45.314 kg (99 lb 14.4 oz), SpO2 97 %.  PHYSICAL EXAMINATION:   Physical Exam  Constitutional: She is oriented to person, place, and time and well-developed, well-nourished, and in no distress. No distress.  HENT:  Head: Normocephalic.  Eyes: No scleral icterus.  Neck: Normal range of motion. Neck supple. No JVD present. No tracheal deviation present.  Cardiovascular: Regular rhythm and normal heart sounds.  Exam reveals no gallop and no friction rub.   No murmur heard. Pulmonary/Chest: Effort normal. No  respiratory distress. She has no wheezes. She has no rales. She exhibits no tenderness.  Better airflow this am  Abdominal: Soft. Bowel sounds are normal. She exhibits no distension and no mass. There is no tenderness. There is no rebound and no guarding.  Musculoskeletal: Normal range of motion. She exhibits no edema.  Neurological: She is alert and oriented to person, place, and time.  Skin: Skin is warm. No rash noted. No erythema.  Psychiatric: Affect and judgment normal.      LABORATORY PANEL:   CBC  Recent Labs Lab 03/15/16 0510  WBC 4.1  HGB 13.2  HCT 36.9  PLT 189   ------------------------------------------------------------------------------------------------------------------  Chemistries   Recent Labs Lab 03/14/16 2213  03/17/16 0452  NA 133*  < > 127*  K 4.0  < > 4.9  CL 96*  < > 93*  CO2 28  < > 27  GLUCOSE 119*  < > 111*  BUN 12  < > 33*  CREATININE 0.52  < > 0.77  CALCIUM 9.3  < > 9.2  AST 26  --   --   ALT 16  --   --   ALKPHOS 44  --   --   BILITOT 0.5  --   --   < > = values in this interval not displayed. ------------------------------------------------------------------------------------------------------------------  Cardiac Enzymes  Recent Labs Lab 03/14/16 2213  TROPONINI <0.03   ------------------------------------------------------------------------------------------------------------------  RADIOLOGY:  Ct Angio Chest Pe W Or Wo Contrast  03/15/2016  CLINICAL DATA:  Shortness of Breath EXAM: CT ANGIOGRAPHY CHEST WITH CONTRAST TECHNIQUE: Multidetector CT imaging of the chest was performed using the standard  protocol during bolus administration of intravenous contrast. Multiplanar CT image reconstructions and MIPs were obtained to evaluate the vascular anatomy. CONTRAST:  75 mL Isovue 370 nonionic COMPARISON:  Chest radiograph March 14, 2016 FINDINGS: Cardiovascular: There is no demonstrable pulmonary embolus. There is atherosclerotic  calcification throughout the thoracic aorta. The ascending thoracic aorta has a transverse diameter of 4.1 x 4.1 cm. No dissection is apparent. Aorta is somewhat tortuous. There are scattered foci of calcification in the proximal great vessels. Visualized great vessels otherwise appear unremarkable. There is extensive coronary artery calcification. There is mild left ventricular hypertrophy. Pericardium is not appreciably thickened. Mediastinum/Nodes: The thyroid is enlarged with multiple nodular lesions throughout the thyroid. Benign-appearing calcification is seen on the right. There is an apparent dominant mass arising from the left lobe of the thyroid measuring 2.0 x 1.7 cm. There are scattered subcentimeter lymph nodes in the thoracic region. There is a right hilar lymph node measuring 1.1 x 1.0 cm. There is a lymph node just anterior to the proximal left main bronchus measuring 1.2 x 1.2 cm. There is a small hiatal hernia. Lungs/Pleura: There is a degree of underlying centrilobular emphysematous change. There is patchy airspace consolidation in the posterior segment of the left lower lobe. There are scattered areas of scarring and peripheral fibrotic type change, most notably in the right middle and right lower lobes peripherally with similar changes to a lesser degree in the left lower lobe. There is a second area of patchy infiltrate in the anterior segment of the left upper lobe slightly to the left of the anterior mediastinum. There is a slightly irregular in contour a smaller semi-solid nodular opacity in the posterior segment of the right upper lobe near the apex measures 4 x 4 mm and is seen on axial slice 19 series 6. Several tree on but did appearing lesions are noted in the anterior segment left upper lobe slightly superior to the area of consolidation described adjacent to the anterior mediastinum in the left upper lobe. Semi-solid opacity in the right apex measuring 9 x 7 mm, best seen on axial  slice 14 series 6. Upper Abdomen: There is extensive atherosclerotic calcification in the visualized upper abdomen. A small granuloma is noted in the liver near the apex. There is a 7 mm cyst in the medial segment of the left lobe of the liver. Visualized upper abdominal structures otherwise appear unremarkable. Musculoskeletal: There is degenerative change in the thoracic and upper lumbar spine regions. Disc degeneration is most severe in the upper lumbar regions. There is upper lumbar levoscoliosis with lower thoracic dextroscoliosis. There are no blastic or lytic bone lesions. Review of the MIP images confirms the above findings. IMPRESSION: No pulmonary embolus evident. Ascending thoracic aorta prominence with transverse diameter of 4.1 x 4.1 cm. Recommend annual imaging followup by CTA or MRA. This recommendation follows 2010 ACCF/AHA/AATS/ACR/ASA/SCA/SCAI/SIR/STS/SVM Guidelines for the Diagnosis and Management of Patients with Thoracic Aortic Disease. Circulation. 2010; [id number]. Extensive atherosclerotic calcification in the aorta. Multiple coronary artery calcifications noted. Underlying centrilobular emphysema. Areas of pneumonia in the left upper and left lower lobes. Areas of peripheral fibrosis in the lower lobes and right middle lobe peripherally. There are semi-solid opacities in the right upper lobe, the largest measuring 9 x 7 mm. This semi-solid opacity is slightly irregular in contour. Non-contrast chest CT at 3-6 months is recommended. If nodules persist, subsequent management will be based upon the most suspicious nodule(s). This recommendation follows the consensus statement: Guidelines for Management of  Incidental Pulmonary Nodules Detected on CT Images:From the Fleischner Society 2017; published online before print (10.1148/[id number]). Borderline prominent lymph nodes as noted above, likely of reactive/inflammatory etiology. Electronically Signed   By: Lowella Grip III M.D.    On: 03/15/2016 12:26     ASSESSMENT AND PLAN:   82 year old female with history of COPD who presents with shortness of breath.  1. Acute COPD exacerbation/CAP : with slow improvement Continue ti wean IV steroids, nebulizers and inhaler. Levaquin D2/7  for CAP.  2. Uncontrolled hypertension: Patient reports she stopped blood pressure medication several years ago. Continue norvasc.  3 hyponatremia: possibly from COPD?CAP/SIADH vs hypovolemia Na level still low this am, pt without symptoms. TRY IIVF and recheck na level at 1400 today. [facility].   4. Metabolic alkalosis: Likely from steroids.   Management plans discussed with the patient  And son and she is in agreement.  CODE STATUS: full  TOTAL TIME TAKING CARE OF THIS PATIENT: 27 minutes.     POSSIBLE D/C 1-2 days, DEPENDING ON CLINICAL CONDITION.   Valon Glasscock M.D on 03/17/2016 at 8:25 AM  Between 7am to 6pm - Pager - [phone] After 6pm go to [email or web address] - password [email or web address] [facility]  Office  [phone]  CC: Primary care physician; Madelyn Brunner, MD  Note: This dictation was prepared with Dragon dictation along with smaller phrase technology. Any transcriptional errors that result from this process are unintentional.

## 2016-03-17 NOTE — Consult Note (Signed)
[facility] CONSULT NOTE    Date: 03/17/2016                  Patient Name:  Janice Hoffman  MRN: [id number]  DOB: 11-24-1925  Age / Sex: 82 y.o., female         PCP: Madelyn Brunner, MD                 Service Requesting Consult: Dr. Benjie Karvonen                 Reason for Consult: hyponatremia            History of Present Illness: Patient is a 82 y.o. female with a PMHx of COPD, history of herpes zoster infection, who was admitted to [facility] on 03/14/2016 for evaluation of shortness of breath and increasing anxiety. She was admitted on 03/15/2016 for increasing shortness of breath.  Initial chest x-ray demonstrated stable hyperinflation without superimposed acute process.  Subsequent to this CT scan of the chest was performed which demonstrated centrilobular emphysema as well as areas of pneumonia in the left upper and left lower lobes.  When the patient first presented her serum sodium was 135. It dropped to 1:30 yesterday and is currently down to 127. She is actively short of breath and receiving nebulizer therapy. She is quite anxious as well. 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   Medications: Outpatient medications: Prescriptions prior to admission  Medication Sig Dispense Refill Last Dose  . acetaminophen (TYLENOL) 325 MG tablet Take 650 mg by mouth every 6 (six) hours as needed.   prn at prn  . albuterol (PROVENTIL HFA;VENTOLIN HFA) 108 (90 Base) MCG/ACT inhaler Inhale 2 puffs into the lungs every 6 (six) hours as needed for wheezing or shortness of breath.   prn at prn  . albuterol (PROVENTIL) (5 MG/ML) 0.5% nebulizer solution Take 2.5 mg by nebulization every 6 (six) hours as needed for wheezing or shortness of breath.   prn at prn  . Fluticasone-Salmeterol (ADVAIR) 500-50 MCG/DOSE AEPB Inhale 1 puff into the lungs 2 (two) times daily.     Marland Kitchen levalbuterol (XOPENEX) 0.63 MG/3ML nebulizer solution Take 0.63 mg by nebulization every 4 (four) hours as needed for  wheezing or shortness of breath.   prn at prn  . oxyCODONE-acetaminophen (PERCOCET/ROXICET) 5-325 MG tablet Take 1 tablet by mouth every 6 (six) hours as needed for severe pain.     . polyethylene glycol (MIRALAX / GLYCOLAX) packet Take 17 g by mouth daily as needed.   prn at prn  . traMADol (ULTRAM) 50 MG tablet Take 50 mg by mouth every 6 (six) hours as needed. Reported on 03/14/2016   prn at prn    Current medications: Current Facility-Administered Medications  Medication Dose Route Frequency Provider Last Rate Last Dose  . 0.9 %  sodium chloride infusion  250 mL Intravenous PRN Saundra Shelling, MD      . acetaminophen (TYLENOL) tablet 650 mg  650 mg Oral Q6H PRN Saundra Shelling, MD   325 mg at 03/15/16 1040  . amLODipine (NORVASC) tablet 10 mg  10 mg Oral Daily [name], MD   10 mg at 03/17/16 0756  . enoxaparin (LOVENOX) injection 40 mg  40 mg Subcutaneous QHS [name], MD   40 mg at 03/16/16 2040  . hydrALAZINE (APRESOLINE) injection 10 mg  10 mg Intravenous Q4H PRN Saundra Shelling, MD   10 mg at 03/16/16 2040  . ipratropium-albuterol (  DUONEB) 0.5-2.5 (3) MG/3ML nebulizer solution 3 mL  3 mL Nebulization Q6H Pavan Pyreddy, MD   3 mL at 03/17/16 0711  . ipratropium-albuterol (DUONEB) 0.5-2.5 (3) MG/3ML nebulizer solution 3 mL  3 mL Nebulization Q4H PRN Debby Crosley, MD   3 mL at 03/15/16 0933  . [START ON 03/18/2016] levofloxacin (LEVAQUIN) IVPB 750 mg  750 mg Intravenous Q48H Sital Mody, MD      . lidocaine (LIDODERM) 5 % 1 patch  1 patch Transdermal Q24H [name], MD   1 patch at 03/17/16 0800  . methylPREDNISolone sodium succinate (SOLU-MEDROL) 40 mg/mL injection 40 mg  40 mg Intravenous Q12H [name], MD   40 mg at 03/17/16 0515  . morphine 2 MG/ML injection 2 mg  2 mg Intravenous Q3H PRN Theodoro Grist, MD   2 mg at 03/16/16 2254  . ondansetron (ZOFRAN) tablet 4 mg  4 mg Oral Q6H PRN Saundra Shelling, MD       Or  . ondansetron (ZOFRAN) injection 4 mg  4 mg Intravenous Q6H PRN Saundra Shelling, MD   4 mg at 03/16/16 2254  . oxyCODONE-acetaminophen (PERCOCET/ROXICET) 5-325 MG per tablet 1 tablet  1 tablet Oral Q4H PRN Theodoro Grist, MD   1 tablet at 03/16/16 2147  . polyethylene glycol (MIRALAX / GLYCOLAX) packet 17 g  17 g Oral Daily PRN Saundra Shelling, MD   17 g at 03/16/16 0820  . sodium chloride flush (NS) 0.9 % injection 3 mL  3 mL Intravenous Q12H Saundra Shelling, MD   3 mL at 03/17/16 0802  . sodium chloride flush (NS) 0.9 % injection 3 mL  3 mL Intravenous PRN Saundra Shelling, MD          Allergies: Allergies  Allergen Reactions  . Nsaids Shortness Of Breath    Throat swelling, shortness of breath  . Penicillins   . Promethazine Hcl       Past Medical History: Past Medical History  Diagnosis Date  . COPD (chronic obstructive pulmonary disease) ([place])   . Herpes zoster   . Neuralgia   . Asthma   . Hypertension      Past Surgical History: Past Surgical History  Procedure Laterality Date  . Leg surgery       Family History: Family History  Problem Relation Age of Onset  . COPD Sister      Social History: Social History   Social History  . Marital Status: Married    Spouse Name: N/A  . Number of Children: N/A  . Years of Education: N/A   Occupational History  . retired    Social History Main Topics  . Smoking status: Never Smoker   . Smokeless tobacco: Not on file  . Alcohol Use: No  . Drug Use: No  . Sexual Activity: Not on file   Other Topics Concern  . Not on file   Social History Narrative     Review of Systems: Review of Systems  Constitutional: Positive for malaise/fatigue. Negative for fever and chills.  HENT: Negative for hearing loss and nosebleeds.   Eyes: Negative for blurred vision and double vision.  Respiratory: Positive for cough, shortness of breath and wheezing.   Cardiovascular: Positive for orthopnea. Negative for chest pain and palpitations.  Gastrointestinal: Negative for heartburn, nausea and vomiting.   Genitourinary: Negative for dysuria, urgency and frequency.  Musculoskeletal: Positive for myalgias and joint pain.  Skin: Negative for itching and rash.  Neurological: Negative for dizziness, focal weakness and  headaches.  Psychiatric/Behavioral: Negative for depression. The patient is nervous/anxious.      Vital Signs: Blood pressure 167/75, pulse 106, temperature 97.9 F (36.6 C), temperature source Oral, resp. rate 18, height 5\' 4"  (1.626 m), weight 45.314 kg (99 lb 14.4 oz), SpO2 100 %.  Weight trends: Filed Weights   03/14/16 2200 03/15/16 0232  Weight: 58.06 kg (128 lb) 45.314 kg (99 lb 14.4 oz)    Physical Exam: General: anxious  Head: Normocephalic, atraumatic.  Eyes: Anicteric, EOMI  Nose: Mucous membranes moist, not inflammed, nonerythematous.  Throat: Oropharynx nonerythematous, no exudate appreciated.   Neck: Supple, trachea midline.  Lungs:  Tachypneic, receiving nebs therapy, bilateral wheezing  Heart: S1S2 no rubs  Abdomen:  BS normoactive. Soft, Nondistended, non-tender.  No masses or organomegaly.  Extremities: No pretibial edema.  Neurologic: A&O X3, Motor strength is 5/5 in the all 4 extremities  Skin: No visible rashes, scars.    Lab results: Basic Metabolic Panel:  Recent Labs Lab 03/15/16 0510 03/16/16 0749 03/17/16 0452  NA 135 130* 127*  K 3.7 4.5 4.9  CL 100* 94* 93*  CO2 28 29 27   GLUCOSE 143* 130* 111*  BUN 13 24* 33*  CREATININE 0.62 0.58 0.77  CALCIUM 9.2 9.6 9.2    Liver Function Tests:  Recent Labs Lab 03/14/16 2213  AST 26  ALT 16  ALKPHOS 44  BILITOT 0.5  PROT 7.5  ALBUMIN 4.4   No results for input(s): LIPASE, AMYLASE in the last 168 hours. No results for input(s): AMMONIA in the last 168 hours.  CBC:  Recent Labs Lab 03/14/16 2213 03/15/16 0510  WBC 4.9 4.1  HGB 14.1 13.2  HCT 41.0 36.9  MCV 88.9 88.2  PLT 182 189    Cardiac Enzymes:  Recent Labs Lab 03/14/16 2213  TROPONINI <0.03     BNP: Invalid input(s): POCBNP  CBG: No results for input(s): GLUCAP in the last 168 hours.  Microbiology: Results for orders placed or performed during the hospital encounter of 06/28/10  Urine culture     Status: None   Collection Time: 06/27/10  1:53 PM  Result Value Ref Range Status   Specimen Description URINE, CLEAN CATCH  Final   Special Requests A  Final   Culture  Setup Time [id number]  Final   Colony Count 4,000 COLONIES/ML  Final   Culture INSIGNIFICANT GROWTH  Final   Report Status 06/28/2010 FINAL  Final  Surgical pcr screen     Status: None   Collection Time: 06/27/10  1:54 PM  Result Value Ref Range Status   MRSA, PCR NEGATIVE NEGATIVE Final   Staphylococcus aureus  NEGATIVE Final    NEGATIVE        The Xpert SA Assay (FDA approved for NASAL specimens only), is one component of a comprehensive surveillance program.  It is not intended to diagnose infection nor to guide or monitor treatment.  Wound culture     Status: None   Collection Time: 06/28/10  1:50 PM  Result Value Ref Range Status   Specimen Description WOUND KNEE LEFT  Final   Special Requests PT ON VANC  Final   Gram Stain   Final    NO WBC SEEN NO SQUAMOUS EPITHELIAL CELLS SEEN NO ORGANISMS SEEN   Culture NO GROWTH 2 DAYS  Final   Report Status 07/01/2010 FINAL  Final  Anaerobic culture     Status: None   Collection Time: 06/28/10  1:50 PM  Result Value Ref Range  Status   Specimen Description WOUND KNEE LEFT  Final   Special Requests PT ON VANC  Final   Gram Stain   Final    NO WBC SEEN NO SQUAMOUS EPITHELIAL CELLS SEEN NO ORGANISMS SEEN   Culture NO ANAEROBES ISOLATED  Final   Report Status 07/03/2010 FINAL  Final  AFB culture     Status: None   Collection Time: 06/28/10  1:50 PM  Result Value Ref Range Status   Specimen Description WOUND KNEE LEFT  Final   Special Requests PT ON VANC  Final   Acid Fast Smear NO ACID FAST BACILLI SEEN  Final   Culture NO ACID FAST BACILLI  ISOLATED IN 6 WEEKS  Final   Report Status 08/12/2010 FINAL  Final    Coagulation Studies: No results for input(s): LABPROT, INR in the last 72 hours.  Urinalysis: No results for input(s): COLORURINE, LABSPEC, PHURINE, GLUCOSEU, HGBUR, BILIRUBINUR, KETONESUR, PROTEINUR, UROBILINOGEN, NITRITE, LEUKOCYTESUR in the last 72 hours.  Invalid input(s): APPERANCEUR    Imaging: Ct Angio Chest Pe W Or Wo Contrast  03/15/2016  CLINICAL DATA:  Shortness of Breath EXAM: CT ANGIOGRAPHY CHEST WITH CONTRAST TECHNIQUE: Multidetector CT imaging of the chest was performed using the standard protocol during bolus administration of intravenous contrast. Multiplanar CT image reconstructions and MIPs were obtained to evaluate the vascular anatomy. CONTRAST:  75 mL Isovue 370 nonionic COMPARISON:  Chest radiograph March 14, 2016 FINDINGS: Cardiovascular: There is no demonstrable pulmonary embolus. There is atherosclerotic calcification throughout the thoracic aorta. The ascending thoracic aorta has a transverse diameter of 4.1 x 4.1 cm. No dissection is apparent. Aorta is somewhat tortuous. There are scattered foci of calcification in the proximal great vessels. Visualized great vessels otherwise appear unremarkable. There is extensive coronary artery calcification. There is mild left ventricular hypertrophy. Pericardium is not appreciably thickened. Mediastinum/Nodes: The thyroid is enlarged with multiple nodular lesions throughout the thyroid. Benign-appearing calcification is seen on the right. There is an apparent dominant mass arising from the left lobe of the thyroid measuring 2.0 x 1.7 cm. There are scattered subcentimeter lymph nodes in the thoracic region. There is a right hilar lymph node measuring 1.1 x 1.0 cm. There is a lymph node just anterior to the proximal left main bronchus measuring 1.2 x 1.2 cm. There is a small hiatal hernia. Lungs/Pleura: There is a degree of underlying centrilobular emphysematous change.  There is patchy airspace consolidation in the posterior segment of the left lower lobe. There are scattered areas of scarring and peripheral fibrotic type change, most notably in the right middle and right lower lobes peripherally with similar changes to a lesser degree in the left lower lobe. There is a second area of patchy infiltrate in the anterior segment of the left upper lobe slightly to the left of the anterior mediastinum. There is a slightly irregular in contour a smaller semi-solid nodular opacity in the posterior segment of the right upper lobe near the apex measures 4 x 4 mm and is seen on axial slice 19 series 6. Several tree on but did appearing lesions are noted in the anterior segment left upper lobe slightly superior to the area of consolidation described adjacent to the anterior mediastinum in the left upper lobe. Semi-solid opacity in the right apex measuring 9 x 7 mm, best seen on axial slice 14 series 6. Upper Abdomen: There is extensive atherosclerotic calcification in the visualized upper abdomen. A small granuloma is noted in the liver near the apex. There  is a 7 mm cyst in the medial segment of the left lobe of the liver. Visualized upper abdominal structures otherwise appear unremarkable. Musculoskeletal: There is degenerative change in the thoracic and upper lumbar spine regions. Disc degeneration is most severe in the upper lumbar regions. There is upper lumbar levoscoliosis with lower thoracic dextroscoliosis. There are no blastic or lytic bone lesions. Review of the MIP images confirms the above findings. IMPRESSION: No pulmonary embolus evident. Ascending thoracic aorta prominence with transverse diameter of 4.1 x 4.1 cm. Recommend annual imaging followup by CTA or MRA. This recommendation follows 2010 ACCF/AHA/AATS/ACR/ASA/SCA/SCAI/SIR/STS/SVM Guidelines for the Diagnosis and Management of Patients with Thoracic Aortic Disease. Circulation. 2010; [id number]. Extensive  atherosclerotic calcification in the aorta. Multiple coronary artery calcifications noted. Underlying centrilobular emphysema. Areas of pneumonia in the left upper and left lower lobes. Areas of peripheral fibrosis in the lower lobes and right middle lobe peripherally. There are semi-solid opacities in the right upper lobe, the largest measuring 9 x 7 mm. This semi-solid opacity is slightly irregular in contour. Non-contrast chest CT at 3-6 months is recommended. If nodules persist, subsequent management will be based upon the most suspicious nodule(s). This recommendation follows the consensus statement: Guidelines for Management of Incidental Pulmonary Nodules Detected on CT Images:From the Fleischner Society 2017; published online before print (10.1148/radiol.IJ:2314499). Borderline prominent lymph nodes as noted above, likely of reactive/inflammatory etiology. Electronically Signed   By: Lowella Grip III M.D.   On: 03/15/2016 12:26      Assessment & Plan: Pt is a 83 y.o. female with a PMHx of COPD, history of herpes zoster infection, who was admitted to [facility] on 03/14/2016 for evaluation of shortness of breath and increasing anxiety.   1.  Hyponatremia due to SIADH from pneumonia/COPD exacerbation. 2.  Acute COPD exacerbation. 3.  Azotemia secondary to steroids.  4.  Bacterial pneumonia noted on chest CT.  Plan:  We will consulted for the evaluation management of hyponatremia. Serum sodium has trended down to 127. Suspect that the patient has SIADH from pneumonia as well as COPD exacerbation. We will check serum and urine osmolality as well as TSH and uric acid. We will give a test dose of Lasix 21 g IV 1 to promote free water excretion. We also recommend free water restriction of 1000 cc per day. Follow serum sodium over the next 24-48 hours. Continue nebulizer and oxygen therapy for her underlying COPD exacerbation. Thanks for consultation.

## 2016-03-18 LAB — BASIC METABOLIC PANEL
ANION GAP: 10 (ref 5–15)
BUN: 24 mg/dL — ABNORMAL HIGH (ref 6–20)
CALCIUM: 9.5 mg/dL (ref 8.9–10.3)
CO2: 26 mmol/L (ref 22–32)
CREATININE: 0.72 mg/dL (ref 0.44–1.00)
Chloride: 92 mmol/L — ABNORMAL LOW (ref 101–111)
GFR calc Af Amer: >60 mL/min (ref >60)
GLUCOSE: 139 mg/dL — AB (ref 65–99)
Potassium: 4.9 mmol/L (ref 3.5–5.1)
Sodium: 128 mmol/L — ABNORMAL LOW (ref 135–145)

## 2016-03-18 MED ORDER — MUSCLE RUB 10-15 % EX CREA
1.0000 "application " | TOPICAL_CREAM | CUTANEOUS | Status: DC | PRN
  Administered 2016-03-18: 1 via TOPICAL
  Filled 2016-03-18: qty 85

## 2016-03-18 MED ORDER — FUROSEMIDE 20 MG PO TABS: 20.0000 mg | ORAL_TABLET | Freq: Every day | ORAL | Status: DC

## 2016-03-18 MED ORDER — FLEET ENEMA 7-19 GM/118ML RE ENEM
1.0000 | ENEMA | Freq: Once | RECTAL | Status: AC
  Administered 2016-03-18: 14:00:00 1 via RECTAL

## 2016-03-18 MED ORDER — FUROSEMIDE 20 MG PO TABS
20.0000 mg | ORAL_TABLET | Freq: Every day | ORAL | Status: DC
  Filled 2016-03-18: qty 1

## 2016-03-18 MED ORDER — ENSURE ENLIVE PO LIQD: 237.0000 mL | Freq: Two times a day (BID) | ORAL | Status: DC

## 2016-03-18 MED ORDER — MUSCLE RUB 10-15 % EX CREA: 1.0000 "application " | TOPICAL_CREAM | CUTANEOUS | Status: DC | PRN

## 2016-03-18 MED ORDER — FLEET ENEMA 7-19 GM/118ML RE ENEM
1.0000 | ENEMA | Freq: Once | RECTAL | Status: AC
Start: 2016-03-18 — End: 2016-03-18
  Administered 2016-03-18: 16:00:00 1 via RECTAL

## 2016-03-18 NOTE — Progress Notes (Signed)
Notified Dr Benjie Karvonen that yesterday pt's abd was distended at taut, pt felt bloated. Dulcolax supp given and miralax.Pt reported having a small BM last night. This am abdomen distended, soft, non-tender,passing flatus. Pt still reports abdominal discomfort, but declines an intervention. No new orders received.

## 2016-03-18 NOTE — Progress Notes (Signed)
PT Cancellation Note  Patient Details Name: Janice Hoffman MRN: [id number] DOB: Sep 21, 1925   Cancelled Treatment:    Pt declined session.  Reported she is going home at some point today.  Stated she needed to eat lunch first before participating and asked writer to return later in the day.  Will attempt as time allows.   Chesley Noon 03/18/2016, 11:09 AM

## 2016-03-18 NOTE — Care Management Important Message (Signed)
Important Message  Patient Details  Name: FAIRY DAVIDS MRN: [id number] Date of Birth: July 31, 1926   Medicare Important Message Given:  Yes    Shelbie Ammons, RN 03/18/2016, 8:43 AM

## 2016-03-18 NOTE — Progress Notes (Signed)
Pt has been discharged home with home health. Discharge instructions given and explained to pt and son. Pt's son verbalized understanding. Meds and f/u appointment reviewed with pt and pt's son. RX given. Pt escorted on a wheelchair.

## 2016-03-18 NOTE — Progress Notes (Signed)
[facility]  ROUNDING NOTE   Subjective:   Sodium stable at 128 Blood pressure not at goal Patient requesting to be discharged.   Objective:  Vital signs in last 24 hours:  Temp:  [97.9 F (36.6 C)-98.6 F (37 C)] 98.6 F (37 C) (07/10 0455) Pulse Rate:  [86-92] 91 (07/10 1104) Resp:  [18-20] 20 (07/10 1104) BP: (136-204)/(68-89) 167/77 mmHg (07/10 1157) SpO2:  [92 %-100 %] 100 % (07/10 1104)  Weight change:  Filed Weights   03/14/16 2200 03/15/16 0232  Weight: 58.06 kg (128 lb) 45.314 kg (99 lb 14.4 oz)    Intake/Output: I/O last 3 completed shifts: In: 1080 [P.O.:1080] Out: -    Intake/Output this shift:     Physical Exam: General: NAD,   Head: Normocephalic, atraumatic. Moist oral mucosal membranes  Eyes: Anicteric, PERRL  Neck: Supple, trachea midline  Lungs:  Clear to auscultation  Heart: Regular rate and rhythm  Abdomen:  Soft, nontender,   Extremities: no peripheral edema.  Neurologic: Nonfocal, moving all four extremities  Skin: No lesions       Basic Metabolic Panel:  Recent Labs Lab 03/15/16 0510 03/16/16 0749 03/17/16 0452 03/17/16 1354 03/18/16 0948  NA 135 130* 127* 128* 128*  K 3.7 4.5 4.9 4.4 4.9  CL 100* 94* 93* 93* 92*  CO2 28 29 27 26 26   GLUCOSE 143* 130* 111* 126* 139*  BUN 13 24* 33* 31* 24*  CREATININE 0.62 0.58 0.77 0.75 0.72  CALCIUM 9.2 9.6 9.2 9.3 9.5    Liver Function Tests:  Recent Labs Lab 03/14/16 2213  AST 26  ALT 16  ALKPHOS 44  BILITOT 0.5  PROT 7.5  ALBUMIN 4.4   No results for input(s): LIPASE, AMYLASE in the last 168 hours. No results for input(s): AMMONIA in the last 168 hours.  CBC:  Recent Labs Lab 03/14/16 2213 03/15/16 0510  WBC 4.9 4.1  HGB 14.1 13.2  HCT 41.0 36.9  MCV 88.9 88.2  PLT 182 189    Cardiac Enzymes:  Recent Labs Lab 03/14/16 2213  TROPONINI <0.03    BNP: Invalid input(s): POCBNP  CBG: No results for input(s): GLUCAP in the last 168  hours.  Microbiology: Results for orders placed or performed during the hospital encounter of 06/28/10  Urine culture     Status: None   Collection Time: 06/27/10  1:53 PM  Result Value Ref Range Status   Specimen Description URINE, CLEAN CATCH  Final   Special Requests A  Final   Culture  Setup Time [id number]  Final   Colony Count 4,000 COLONIES/ML  Final   Culture INSIGNIFICANT GROWTH  Final   Report Status 06/28/2010 FINAL  Final  Surgical pcr screen     Status: None   Collection Time: 06/27/10  1:54 PM  Result Value Ref Range Status   MRSA, PCR NEGATIVE NEGATIVE Final   Staphylococcus aureus  NEGATIVE Final    NEGATIVE        The Xpert SA Assay (FDA approved for NASAL specimens only), is one component of a comprehensive surveillance program.  It is not intended to diagnose infection nor to guide or monitor treatment.  Wound culture     Status: None   Collection Time: 06/28/10  1:50 PM  Result Value Ref Range Status   Specimen Description WOUND KNEE LEFT  Final   Special Requests PT ON VANC  Final   Gram Stain   Final    NO WBC SEEN NO  SQUAMOUS EPITHELIAL CELLS SEEN NO ORGANISMS SEEN   Culture NO GROWTH 2 DAYS  Final   Report Status 07/01/2010 FINAL  Final  Anaerobic culture     Status: None   Collection Time: 06/28/10  1:50 PM  Result Value Ref Range Status   Specimen Description WOUND KNEE LEFT  Final   Special Requests PT ON VANC  Final   Gram Stain   Final    NO WBC SEEN NO SQUAMOUS EPITHELIAL CELLS SEEN NO ORGANISMS SEEN   Culture NO ANAEROBES ISOLATED  Final   Report Status 07/03/2010 FINAL  Final  AFB culture     Status: None   Collection Time: 06/28/10  1:50 PM  Result Value Ref Range Status   Specimen Description WOUND KNEE LEFT  Final   Special Requests PT ON VANC  Final   Acid Fast Smear NO ACID FAST BACILLI SEEN  Final   Culture NO ACID FAST BACILLI ISOLATED IN 6 WEEKS  Final   Report Status 08/12/2010 FINAL  Final    Coagulation  Studies: No results for input(s): LABPROT, INR in the last 72 hours.  Urinalysis: No results for input(s): COLORURINE, LABSPEC, PHURINE, GLUCOSEU, HGBUR, BILIRUBINUR, KETONESUR, PROTEINUR, UROBILINOGEN, NITRITE, LEUKOCYTESUR in the last 72 hours.  Invalid input(s): APPERANCEUR    Imaging: No results found.   Medications:     . amLODipine  10 mg Oral Daily  . enoxaparin (LOVENOX) injection  40 mg Subcutaneous QHS  . ipratropium-albuterol  3 mL Nebulization Q6H  . levofloxacin (LEVAQUIN) IV  750 mg Intravenous Q48H  . lidocaine  1 patch Transdermal Q24H  . methylPREDNISolone (SOLU-MEDROL) injection  40 mg Intravenous Q12H  . sodium chloride flush  3 mL Intravenous Q12H   sodium chloride, acetaminophen, hydrALAZINE, ipratropium-albuterol, morphine injection, MUSCLE RUB, ondansetron **OR** ondansetron (ZOFRAN) IV, oxyCODONE-acetaminophen, polyethylene glycol, sodium chloride flush  Assessment/ Plan:  [name] is a 82 y.o. white female with COPD, history of herpes zoster infection, who was admitted to [facility] on 03/14/2016 for evaluation of shortness of breath and increasing anxiety.   1. Hyponatremia due to SIADH from pneumonia/COPD exacerbation: sodium 128 - stable.  - continue furosemide. Start 20mg  PO daily - fluid restriction  2. Hypertension: not well controlled. IV hydralazine administered.  - amlodipine as outpatient - furosemide as above.   3. Acute COPD exacerbation with pneumonia: solumedrol and antibiotics.    LOS: 3 Davide Risdon 7/10/201712:30 PM

## 2016-03-18 NOTE — Care Management (Addendum)
Spoke with Janice Hoffman and daughter at the bedside. Discussed nursing services in the home. Would like [facility]. Floydene Flock, [facility] representative updated. Shelbie Ammons RN MSN CCM Care Management [phone]

## 2016-03-18 NOTE — Progress Notes (Signed)
Notified Dr Benjie Karvonen that pt had several small hard balls of stool post enema. Pt requests 2nd enema. Pt also reports that she do not want to be discharged today, abd distended, feels constipated, sob and that she unable to care for herself. Pt anxious continuously. Per MD to give 2nd fleet enema and OK to discharge pt.

## 2016-03-18 NOTE — Progress Notes (Signed)
Physical Therapy Treatment Patient Details Name: Janice Hoffman MRN: [id number] DOB: 08/12/1926 Today's Date: 03/18/2016    History of Present Illness Pt presents with COPD exacerbation, SOB, and general weakness and fatigue. Pt has negative PE imaging.     PT Comments    Pt sitting edge of bed upon arrival.  Agrees to session but with complaints of unable to catch her breath.  Sats 98% HR 115 at rest.  Pt stood with modified ind.  She ambulated to door and back with walker and O2 at 2lpm. Upon return to sitting HR was 130 and sats 96%.  Session terminated due to increased heart rate.  Discussed with primary nurse HR and her c/o feeling SOB.  Pt without complaints of chest pain.      Follow Up Recommendations  Outpatient PT     Equipment Recommendations  Rolling walker with 5" wheels    Recommendations for Other Services       Precautions / Restrictions Precautions Precautions: None Restrictions Weight Bearing Restrictions: No    Mobility  Bed Mobility Overal bed mobility: Independent                Transfers Overall transfer level: Modified independent                  Ambulation/Gait Ambulation/Gait assistance: Min guard Ambulation Distance (Feet): 20 Feet Assistive device: Rolling walker (2 wheeled)           Stairs            Wheelchair Mobility    Modified Rankin (Stroke Patients Only)       Balance Overall balance assessment: Modified Independent                                  Cognition Arousal/Alertness: Awake/alert Behavior During Therapy: WFL for tasks assessed/performed Overall Cognitive Status: Within Functional Limits for tasks assessed                      Exercises      General Comments        Pertinent Vitals/Pain      Home Living                      Prior Function            PT Goals (current goals can now be found in the care plan section)      Frequency  Min  2X/week    PT Plan Current plan remains appropriate    Co-evaluation             End of Session Equipment Utilized During Treatment: Gait belt;Oxygen Activity Tolerance: Patient limited by fatigue;Treatment limited secondary to medical complications (Comment) (Increased HR 130) Patient left: in bed     Time: 1226-1239 PT Time Calculation (min) (ACUTE ONLY): 13 min  Charges:  $Gait Training: 8-22 mins                    G Codes:      Chesley Noon, PTA 03/18/2016, 12:47 PM

## 2016-03-18 NOTE — Progress Notes (Signed)
Notified Dr Benjie Karvonen that BP 204/89, rechecked manually BP 184/86. Amlodipine and hydrazine IV given,BP 167/77. Pt is anxious. No new orders.

## 2016-10-19 ENCOUNTER — Emergency Department: Payer: Medicare Other

## 2016-10-19 ENCOUNTER — Emergency Department
Admission: EM | Admit: 2016-10-19 | Discharge: 2016-10-19 | Disposition: A | Discharge status: Home / Self Care | Payer: Medicare Other | Attending: Emergency Medicine | Admitting: Emergency Medicine

## 2016-10-19 DIAGNOSIS — Z79899 Other long term (current) drug therapy: Secondary | ICD-10-CM | POA: Diagnosis not present

## 2016-10-19 DIAGNOSIS — J441 Chronic obstructive pulmonary disease with (acute) exacerbation: Secondary | ICD-10-CM | POA: Diagnosis not present

## 2016-10-19 DIAGNOSIS — R0602 Shortness of breath: Secondary | ICD-10-CM | POA: Diagnosis present

## 2016-10-19 DIAGNOSIS — J45909 Unspecified asthma, uncomplicated: Secondary | ICD-10-CM | POA: Diagnosis not present

## 2016-10-19 DIAGNOSIS — I10 Essential (primary) hypertension: Secondary | ICD-10-CM | POA: Insufficient documentation

## 2016-10-19 LAB — TROPONIN I: Troponin I: <0.03 ng/mL (ref <0.03)

## 2016-10-19 LAB — CBC WITH DIFFERENTIAL/PLATELET
BASOS PCT: 1 %
Basophils Absolute: 0 10*3/uL (ref 0–0.1)
EOS ABS: 0.4 10*3/uL (ref 0–0.7)
Eosinophils Relative: 7 %
HEMATOCRIT: 36.9 % (ref 35.0–47.0)
Hemoglobin: 12.4 g/dL (ref 12.0–16.0)
Lymphocytes Relative: 31 %
Lymphs Abs: 1.8 10*3/uL (ref 1.0–3.6)
MCH: 30.2 pg (ref 26.0–34.0)
MCHC: 33.7 g/dL (ref 32.0–36.0)
MCV: 89.6 fL (ref 80.0–100.0)
MONO ABS: 0.6 10*3/uL (ref 0.2–0.9)
MONOS PCT: 10 %
NEUTROS ABS: 3 10*3/uL (ref 1.4–6.5)
Neutrophils Relative %: 51 %
Platelets: 204 10*3/uL (ref 150–440)
RBC: 4.11 MIL/uL (ref 3.80–5.20)
RDW: 13.7 % (ref 11.5–14.5)
WBC: 5.7 10*3/uL (ref 3.6–11.0)

## 2016-10-19 LAB — BASIC METABOLIC PANEL
Anion gap: 6 (ref 5–15)
BUN: 21 mg/dL — ABNORMAL HIGH (ref 6–20)
CALCIUM: 8.9 mg/dL (ref 8.9–10.3)
CO2: 28 mmol/L (ref 22–32)
Chloride: 101 mmol/L (ref 101–111)
Creatinine, Ser: 0.49 mg/dL (ref 0.44–1.00)
GFR calc non Af Amer: >60 mL/min (ref >60)
Glucose, Bld: 96 mg/dL (ref 65–99)
Potassium: 3.8 mmol/L (ref 3.5–5.1)
Sodium: 135 mmol/L (ref 135–145)

## 2016-10-19 MED ORDER — METHYLPREDNISOLONE SODIUM SUCC 125 MG IJ SOLR
125.0000 mg | Freq: Once | INTRAMUSCULAR | Status: AC
  Administered 2016-10-19: 125 mg via INTRAVENOUS
  Filled 2016-10-19: qty 2

## 2016-10-19 MED ORDER — PREDNISONE 20 MG PO TABS: 40.0000 mg | ORAL_TABLET | Freq: Every day | ORAL | 0 refills | Status: DC

## 2016-10-19 MED ORDER — ALBUTEROL SULFATE (2.5 MG/3ML) 0.083% IN NEBU
5.0000 mg | INHALATION_SOLUTION | Freq: Once | RESPIRATORY_TRACT | Status: AC
  Administered 2016-10-19: 5 mg via RESPIRATORY_TRACT
  Filled 2016-10-19: qty 6

## 2016-10-19 NOTE — ED Provider Notes (Signed)
[facility] Emergency Department Provider Note   ____________________________________________   I have reviewed the triage vital signs and the nursing notes.   HISTORY  Chief Complaint Shortness of Breath   History limited by: Not Limited   HPI Janice Hoffman is a 83 y.o. female who presents to the emergency department today because of concerns for shortness of breath. Family states that this is been going on for the past 2-3 weeks. The patient has a history of COPD. Family states that this happens occasionally. Patient denies any chest pain or fevers. No productive cough. Patient's main complaint is for left leg pain which has been chronic since a surgery.  Past Medical History:  Diagnosis Date  . Asthma   . COPD (chronic obstructive pulmonary disease) ([place])   . Herpes zoster   . Hypertension   . Neuralgia     Patient Active Problem List   Diagnosis Date Noted  . COPD exacerbation ([place]) 03/15/2016  . 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 ELEVATED BLOOD PRESSURE 05/12/2008  . HERPES ZOSTER 04/26/2008  . B12 DEFICIENCY 04/26/2008  . CONSTIPATION 04/26/2008  . LEG EDEMA, BILATERAL 04/26/2008    Past Surgical History:  Procedure Laterality Date  . LEG SURGERY      Prior to Admission medications   Medication Sig Start Date End Date Taking? Authorizing Provider  acetaminophen (TYLENOL) 325 MG tablet Take 650 mg by mouth every 6 (six) hours as needed.    Historical Provider, MD  albuterol (PROVENTIL HFA;VENTOLIN HFA) 108 (90 Base) MCG/ACT inhaler Inhale 2 puffs into the lungs every 6 (six) hours as needed for wheezing or shortness of breath.    Historical Provider, MD  albuterol (PROVENTIL) (5 MG/ML) 0.5% nebulizer solution Take 2.5 mg by nebulization every 6 (six) hours as needed for wheezing or shortness of breath.    Historical Provider, MD  amLODipine (NORVASC) 10 MG tablet Take 1 tablet (10 mg total) by mouth daily. 03/16/16   [name], MD   feeding supplement, ENSURE ENLIVE, (ENSURE ENLIVE) LIQD Take 237 mLs by mouth 2 (two) times daily between meals. 03/18/16   [name], MD  Fluticasone-Salmeterol (ADVAIR) 500-50 MCG/DOSE AEPB Inhale 1 puff into the lungs 2 (two) times daily.    Historical Provider, MD  furosemide (LASIX) 20 MG tablet Take 1 tablet (20 mg total) by mouth daily. 03/18/16   [name], MD  levalbuterol (XOPENEX) 0.63 MG/3ML nebulizer solution Take 0.63 mg by nebulization every 4 (four) hours as needed for wheezing or shortness of breath.    Historical Provider, MD  levofloxacin (LEVAQUIN) 750 MG tablet Take 1 tablet (750 mg total) by mouth daily. 03/16/16   Sital Mody, MD  lidocaine (LIDODERM) 5 % Place 1 patch onto the skin daily. Remove & Discard patch within 12 hours or as directed by MD 03/16/16   [name], MD  Menthol-Methyl Salicylate (MUSCLE RUB) 10-15 % CREA Apply 1 application topically as needed for muscle pain. 03/18/16   [name], MD  oxyCODONE-acetaminophen (PERCOCET/ROXICET) 5-325 MG tablet Take 1 tablet by mouth every 6 (six) hours as needed for severe pain.    Historical Provider, MD  polyethylene glycol (MIRALAX / GLYCOLAX) packet Take 17 g by mouth daily as needed.    Historical Provider, MD  predniSONE (DELTASONE) 10 MG tablet Take 1 tablet (10 mg total) by mouth daily with breakfast. 60 mg PO (ORAL) x 2 days 50 mg PO (ORAL)  x 2 days 40 mg PO (ORAL)  x 2  days 30 mg PO  (ORAL)  x 2 days 20 mg PO  (ORAL) x 2 days 10 mg PO  (ORAL) x 2 days then stop 03/16/16   [name], MD  traMADol (ULTRAM) 50 MG tablet Take 50 mg by mouth every 6 (six) hours as needed. Reported on 03/14/2016    Historical Provider, MD    Allergies Nsaids; Penicillins; and Promethazine hcl  Family History  Problem Relation Age of Onset  . COPD Sister     Social History Social History  Substance Use Topics  . Smoking status: Never Smoker  . Smokeless tobacco: Never Used  . Alcohol use No    Review of  Systems  Constitutional: Negative for fever. Cardiovascular: Negative for chest pain. Respiratory: Negative for shortness of breath. Gastrointestinal: Negative for abdominal pain, vomiting and diarrhea. Genitourinary: Negative for dysuria. Musculoskeletal: Positive for left leg pain. Skin: Positive for bilateral leg rash Neurological: Negative for headaches, focal weakness or numbness.  10-point ROS otherwise negative.  ____________________________________________   PHYSICAL EXAM:  VITAL SIGNS: ED Triage Vitals  Enc Vitals Group     BP --      Pulse --      Resp --      Temp --      Temp src --      SpO2 --      Weight 10/19/16 2032 110 lb (49.9 kg)     Height --      Head Circumference --      Peak Flow --      Pain Score 10/19/16 2033 3    Constitutional: Alert and oriented. Eyes: Conjunctivae are normal. Normal extraocular movements. ENT   Head: Normocephalic and atraumatic.   Nose: No congestion/rhinnorhea.   Mouth/Throat: Mucous membranes are moist.   Neck: No stridor. Hematological/Lymphatic/Immunilogical: No cervical lymphadenopathy. Cardiovascular: Normal rate, regular rhythm.  No murmurs, rubs, or gallops.  Respiratory: Slightly increased respiratory effort. Bilateral diffuse expiratory wheezing.  Gastrointestinal: Soft and non tender. No rebound. No guarding.  Genitourinary: Deferred Musculoskeletal: Normal range of motion in all extremities. No lower extremity edema. Neurologic:  Normal speech and language. No gross focal neurologic deficits are appreciated.  Skin:  Bilateral cellulitis on feet.  Psychiatric: Mood and affect are normal. Speech and behavior are normal. Patient exhibits appropriate insight and judgment.  ____________________________________________    LABS (pertinent positives/negatives)  Labs Reviewed  BASIC METABOLIC PANEL - Abnormal; Notable for the following:       Result Value   BUN 21 (*)    All other components  within normal limits  CBC WITH DIFFERENTIAL/PLATELET  TROPONIN I     ____________________________________________   EKG  I, Nance Pear, attending physician, personally viewed and interpreted this EKG  EKG Time: 2033 Rate: 92 Rhythm: sinus rhythm Axis: normal Intervals: qtc 487 QRS: q waves V1, V2 ST changes: no st elevation Impression: abnormal ekg   ____________________________________________    RADIOLOGY  CXR IMPRESSION:  Hyperinflation without acute infiltrate. Probable scarring at the  left lung base.   ____________________________________________   PROCEDURES  Procedures  ____________________________________________   INITIAL IMPRESSION / ASSESSMENT AND PLAN / ED COURSE  Pertinent labs & imaging results that were available during my care of the patient were reviewed by me and considered in my medical decision making (see chart for details).  Patient presented to the emergency department today because of concerns for shortness of breath. Patient has a history of COPD. Patient was given DuoNeb treatments by EMS as  well as Solu-Medrol and DuoNeb treatment here in the emergency department. She stated she felt much improved after treatments. No concerning findings on x-ray for pneumonia. Additionally blood work and influenza test without concerning findings. Will discharge with further steroids.  ____________________________________________   FINAL CLINICAL IMPRESSION(S) / ED DIAGNOSES  Final diagnoses:  COPD exacerbation ([place])     Note: This dictation was prepared with Dragon dictation. Any transcriptional errors that result from this process are unintentional     Nance Pear, MD 10/19/16 2315

## 2016-10-19 NOTE — ED Triage Notes (Signed)
Pt bib EMS from home w/ c/o SOB and chest tightness.  Pt alert and oriented, denies fever, cough, recent illness, n/v/d or falls.  Per EMS, pt tripoding on scene, given duoneb x 2 and placed on 3L O2.  Pt currently speaking in full sentances w/o issue.  97%.  Pt sts rash to bilateral lower legs started this evening

## 2016-10-19 NOTE — Discharge Instructions (Signed)
Please seek medical attention for any high fevers, chest pain, shortness of breath, change in behavior, persistent vomiting, bloody stool or any other new or concerning symptoms.  

## 2017-01-05 ENCOUNTER — Encounter: Payer: Self-pay | Admitting: Emergency Medicine

## 2017-01-05 ENCOUNTER — Emergency Department: Payer: Medicare Other

## 2017-01-05 ENCOUNTER — Observation Stay
Admission: EM | Admit: 2017-01-05 | Discharge: 2017-01-06 | Disposition: A | Discharge status: Home / Self Care | Payer: Medicare Other | Attending: Internal Medicine | Admitting: Internal Medicine

## 2017-01-05 DIAGNOSIS — J441 Chronic obstructive pulmonary disease with (acute) exacerbation: Secondary | ICD-10-CM | POA: Diagnosis present

## 2017-01-05 DIAGNOSIS — R1310 Dysphagia, unspecified: Secondary | ICD-10-CM | POA: Diagnosis not present

## 2017-01-05 DIAGNOSIS — Z880 Allergy status to penicillin: Secondary | ICD-10-CM | POA: Insufficient documentation

## 2017-01-05 DIAGNOSIS — Z79899 Other long term (current) drug therapy: Secondary | ICD-10-CM | POA: Diagnosis not present

## 2017-01-05 DIAGNOSIS — Z7951 Long term (current) use of inhaled steroids: Secondary | ICD-10-CM | POA: Diagnosis not present

## 2017-01-05 DIAGNOSIS — Z7952 Long term (current) use of systemic steroids: Secondary | ICD-10-CM | POA: Diagnosis not present

## 2017-01-05 DIAGNOSIS — J449 Chronic obstructive pulmonary disease, unspecified: Secondary | ICD-10-CM

## 2017-01-05 DIAGNOSIS — G629 Polyneuropathy, unspecified: Secondary | ICD-10-CM | POA: Diagnosis not present

## 2017-01-05 DIAGNOSIS — Z886 Allergy status to analgesic agent status: Secondary | ICD-10-CM | POA: Insufficient documentation

## 2017-01-05 DIAGNOSIS — Z9889 Other specified postprocedural states: Secondary | ICD-10-CM | POA: Insufficient documentation

## 2017-01-05 DIAGNOSIS — Z836 Family history of other diseases of the respiratory system: Secondary | ICD-10-CM | POA: Insufficient documentation

## 2017-01-05 DIAGNOSIS — I10 Essential (primary) hypertension: Secondary | ICD-10-CM

## 2017-01-05 DIAGNOSIS — Z8619 Personal history of other infectious and parasitic diseases: Secondary | ICD-10-CM | POA: Insufficient documentation

## 2017-01-05 LAB — BRAIN NATRIURETIC PEPTIDE: B Natriuretic Peptide: 306 pg/mL — ABNORMAL HIGH (ref 0.0–100.0)

## 2017-01-05 LAB — CBC WITH DIFFERENTIAL/PLATELET
Basophils Absolute: 0 10*3/uL (ref 0–0.1)
Basophils Relative: 1 %
EOS PCT: 1 %
Eosinophils Absolute: 0 10*3/uL (ref 0–0.7)
HCT: 36.9 % (ref 35.0–47.0)
Hemoglobin: 12.6 g/dL (ref 12.0–16.0)
LYMPHS ABS: 0.6 10*3/uL — AB (ref 1.0–3.6)
LYMPHS PCT: 15 %
MCH: 29.6 pg (ref 26.0–34.0)
MCHC: 34.1 g/dL (ref 32.0–36.0)
MCV: 86.8 fL (ref 80.0–100.0)
MONOS PCT: 8 %
Monocytes Absolute: 0.3 10*3/uL (ref 0.2–0.9)
Neutro Abs: 2.9 10*3/uL (ref 1.4–6.5)
Neutrophils Relative %: 77 %
PLATELETS: 216 10*3/uL (ref 150–440)
RBC: 4.25 MIL/uL (ref 3.80–5.20)
RDW: 13.8 % (ref 11.5–14.5)
WBC: 3.8 10*3/uL (ref 3.6–11.0)

## 2017-01-05 LAB — BASIC METABOLIC PANEL
Anion gap: 7 (ref 5–15)
BUN: 13 mg/dL (ref 6–20)
CHLORIDE: 101 mmol/L (ref 101–111)
CO2: 29 mmol/L (ref 22–32)
CREATININE: 0.51 mg/dL (ref 0.44–1.00)
Calcium: 9.7 mg/dL (ref 8.9–10.3)
GFR calc Af Amer: >60 mL/min (ref >60)
GFR calc non Af Amer: >60 mL/min (ref >60)
GLUCOSE: 127 mg/dL — AB (ref 65–99)
POTASSIUM: 3.8 mmol/L (ref 3.5–5.1)
Sodium: 137 mmol/L (ref 135–145)

## 2017-01-05 LAB — TROPONIN I: Troponin I: <0.03 ng/mL (ref <0.03)

## 2017-01-05 LAB — TSH: TSH: 0.888 u[IU]/mL (ref 0.350–4.500)

## 2017-01-05 MED ORDER — IPRATROPIUM-ALBUTEROL 0.5-2.5 (3) MG/3ML IN SOLN
6.0000 mL | Freq: Once | RESPIRATORY_TRACT | Status: AC
  Administered 2017-01-05: 6 mL via RESPIRATORY_TRACT
  Filled 2017-01-05: qty 6

## 2017-01-05 MED ORDER — TRAMADOL HCL 50 MG PO TABS
50.0000 mg | ORAL_TABLET | Freq: Once | ORAL | Status: AC
  Administered 2017-01-05: 50 mg via ORAL
  Filled 2017-01-05: qty 1

## 2017-01-05 MED ORDER — TRAMADOL HCL 50 MG PO TABS
50.0000 mg | ORAL_TABLET | Freq: Four times a day (QID) | ORAL | Status: DC | PRN
  Administered 2017-01-05 – 2017-01-06 (×2): 50 mg via ORAL
  Filled 2017-01-05 (×2): qty 1

## 2017-01-05 MED ORDER — PREDNISONE 20 MG PO TABS
40.0000 mg | ORAL_TABLET | Freq: Once | ORAL | Status: AC
  Administered 2017-01-05: 40 mg via ORAL
  Filled 2017-01-05: qty 2

## 2017-01-05 MED ORDER — SODIUM CHLORIDE 0.9% FLUSH
3.0000 mL | Freq: Two times a day (BID) | INTRAVENOUS | Status: DC
  Administered 2017-01-06: 3 mL via INTRAVENOUS

## 2017-01-05 MED ORDER — AMLODIPINE BESYLATE 10 MG PO TABS
10.0000 mg | ORAL_TABLET | Freq: Every day | ORAL | Status: DC
  Administered 2017-01-06: 10 mg via ORAL
  Filled 2017-01-05: qty 1

## 2017-01-05 MED ORDER — ENSURE ENLIVE PO LIQD
237.0000 mL | Freq: Two times a day (BID) | ORAL | Status: DC
  Administered 2017-01-05 – 2017-01-06 (×2): 237 mL via ORAL

## 2017-01-05 MED ORDER — FUROSEMIDE 10 MG/ML IJ SOLN
20.0000 mg | Freq: Once | INTRAMUSCULAR | Status: DC
  Filled 2017-01-05: qty 4

## 2017-01-05 MED ORDER — AZITHROMYCIN 500 MG PO TABS: 500.0000 mg | ORAL_TABLET | Freq: Every day | ORAL | Status: AC

## 2017-01-05 MED ORDER — ONDANSETRON HCL 4 MG PO TABS: 4.0000 mg | ORAL_TABLET | Freq: Four times a day (QID) | ORAL | Status: DC | PRN

## 2017-01-05 MED ORDER — SENNA 8.6 MG PO TABS: 1.0000 | ORAL_TABLET | Freq: Every day | ORAL | Status: DC | PRN

## 2017-01-05 MED ORDER — ENOXAPARIN SODIUM 40 MG/0.4ML ~~LOC~~ SOLN: 40.0000 mg | SUBCUTANEOUS | Status: DC

## 2017-01-05 MED ORDER — SODIUM CHLORIDE 0.9% FLUSH: 3.0000 mL | INTRAVENOUS | Status: DC | PRN

## 2017-01-05 MED ORDER — AZITHROMYCIN 500 MG PO TABS
500.0000 mg | ORAL_TABLET | Freq: Once | ORAL | Status: AC
  Administered 2017-01-05: 500 mg via ORAL
  Filled 2017-01-05: qty 1

## 2017-01-05 MED ORDER — ACETAMINOPHEN 650 MG RE SUPP: 650.0000 mg | Freq: Four times a day (QID) | RECTAL | Status: DC | PRN

## 2017-01-05 MED ORDER — GUAIFENESIN ER 600 MG PO TB12
600.0000 mg | ORAL_TABLET | Freq: Two times a day (BID) | ORAL | Status: DC
  Administered 2017-01-06: 600 mg via ORAL
  Filled 2017-01-05: qty 1

## 2017-01-05 MED ORDER — ONDANSETRON HCL 4 MG/2ML IJ SOLN: 4.0000 mg | Freq: Four times a day (QID) | INTRAMUSCULAR | Status: DC | PRN

## 2017-01-05 MED ORDER — LIDOCAINE 5 % EX PTCH
1.0000 | MEDICATED_PATCH | CUTANEOUS | Status: DC
  Administered 2017-01-05: 1 via TRANSDERMAL
  Filled 2017-01-05 (×2): qty 1

## 2017-01-05 MED ORDER — LISINOPRIL 5 MG PO TABS
5.0000 mg | ORAL_TABLET | Freq: Every day | ORAL | Status: DC
  Administered 2017-01-06: 5 mg via ORAL
  Filled 2017-01-05: qty 1

## 2017-01-05 MED ORDER — AZITHROMYCIN 250 MG PO TABS
250.0000 mg | ORAL_TABLET | Freq: Every day | ORAL | Status: DC
  Administered 2017-01-06: 250 mg via ORAL
  Filled 2017-01-05: qty 1

## 2017-01-05 MED ORDER — ACETAMINOPHEN 325 MG PO TABS
650.0000 mg | ORAL_TABLET | Freq: Four times a day (QID) | ORAL | Status: DC | PRN
  Administered 2017-01-06: 650 mg via ORAL
  Filled 2017-01-05: qty 2

## 2017-01-05 MED ORDER — TIOTROPIUM BROMIDE MONOHYDRATE 18 MCG IN CAPS
18.0000 ug | ORAL_CAPSULE | Freq: Every day | RESPIRATORY_TRACT | Status: DC
  Administered 2017-01-05 – 2017-01-06 (×2): 18 ug via RESPIRATORY_TRACT
  Filled 2017-01-05: qty 5

## 2017-01-05 MED ORDER — ALBUTEROL SULFATE (2.5 MG/3ML) 0.083% IN NEBU
2.5000 mg | INHALATION_SOLUTION | RESPIRATORY_TRACT | Status: DC
  Administered 2017-01-05 – 2017-01-06 (×5): 2.5 mg via RESPIRATORY_TRACT
  Filled 2017-01-05 (×5): qty 3

## 2017-01-05 MED ORDER — PREDNISONE 20 MG PO TABS: 60.0000 mg | ORAL_TABLET | Freq: Once | ORAL | Status: DC

## 2017-01-05 MED ORDER — POTASSIUM CHLORIDE 20 MEQ PO PACK
20.0000 meq | PACK | Freq: Two times a day (BID) | ORAL | Status: DC
  Administered 2017-01-06: 20 meq via ORAL
  Filled 2017-01-05 (×2): qty 1

## 2017-01-05 MED ORDER — FUROSEMIDE 10 MG/ML IJ SOLN: 20.0000 mg | Freq: Two times a day (BID) | INTRAMUSCULAR | Status: DC

## 2017-01-05 MED ORDER — BUDESONIDE 0.25 MG/2ML IN SUSP
0.2500 mg | Freq: Two times a day (BID) | RESPIRATORY_TRACT | Status: DC
  Administered 2017-01-05 – 2017-01-06 (×3): 0.25 mg via RESPIRATORY_TRACT
  Filled 2017-01-05 (×3): qty 2

## 2017-01-05 NOTE — H&P (Addendum)
Janice Hoffman NAME: Janice Hoffman    MR#:  [id number]  DATE OF BIRTH:  1925-11-04  DATE OF ADMISSION:  01/05/2017  PRIMARY CARE PHYSICIAN: Madelyn Brunner, MD   REQUESTING/REFERRING PHYSICIAN:   CHIEF COMPLAINT:   Chief Complaint  Patient presents with  . Respiratory Distress    HISTORY OF PRESENT ILLNESS: Janice Hoffman  is a 83 y.o. female with a known history of In 2010, who presents to the hospital with complaints of shortness of breath. The patient, she was doing well up until yesterday evening when she started feeling short of breath, could not take a deep breath, she denied any cough or wheezing, she tried inhaler and it improved her breathing, however, worsened again. She told me that her left lower extremity pain got worse and her shortness of breath got worse. On arrival to emergency room, her systolic blood pressure was found to be around 190, she was wheezing and hospitalist services were contacted for admission. Chest x-ray did not show pneumonia or significant congestive heart failure, however, revealed chronic interstitial thickening. Patient denies any chest pains. EKG was unremarkable, except of nonspecific repolarization abnormalities. Patient also admitted of pills getting stuck in the mid esophagus, having difficulty swallowing them whole. She does not want EGD to be done.  PAST MEDICAL HISTORY:   Past Medical History:  Diagnosis Date  . Asthma   . COPD (chronic obstructive pulmonary disease) ([place])   . Herpes zoster   . Hypertension   . Neuralgia     PAST SURGICAL HISTORY:  Past Surgical History:  Procedure Laterality Date  . LEG SURGERY      SOCIAL HISTORY:  Social History  Substance Use Topics  . Smoking status: Never Smoker  . Smokeless tobacco: Never Used  . Alcohol use No    FAMILY HISTORY:  Family History  Problem Relation Age of Onset  . COPD Sister     DRUG ALLERGIES:    Allergies  Allergen Reactions  . Nsaids Shortness Of Breath    Throat swelling, shortness of breath  . Penicillins   . Promethazine Hcl     Review of Systems  Constitutional: Negative for chills, fever and weight loss.  HENT: Negative for congestion.   Eyes: Negative for blurred vision and double vision.  Respiratory: Positive for shortness of breath and wheezing. Negative for cough and sputum production.   Cardiovascular: Negative for chest pain, palpitations, orthopnea, leg swelling and PND.  Gastrointestinal: Negative for abdominal pain, blood in stool, constipation, diarrhea, nausea and vomiting.  Genitourinary: Negative for dysuria, frequency, hematuria and urgency.  Musculoskeletal: Negative for falls.  Neurological: Negative for dizziness, tremors, focal weakness and headaches.  Endo/Heme/Allergies: Does not bruise/bleed easily.  Psychiatric/Behavioral: Negative for depression. The patient does not have insomnia.     MEDICATIONS AT HOME:  Prior to Admission medications   Medication Sig Start Date End Date Taking? Authorizing Provider  acetaminophen (TYLENOL) 325 MG tablet Take 650 mg by mouth every 6 (six) hours as needed.   Yes Historical Provider, MD  albuterol (PROVENTIL HFA;VENTOLIN HFA) 108 (90 Base) MCG/ACT inhaler Inhale 2 puffs into the lungs every 6 (six) hours as needed for wheezing or shortness of breath.   Yes Historical Provider, MD  albuterol (PROVENTIL) (5 MG/ML) 0.5% nebulizer solution Take 2.5 mg by nebulization every 6 (six) hours as needed for wheezing or shortness of breath.   Yes Historical Provider, MD  Fluticasone-Salmeterol (ADVAIR)  500-50 MCG/DOSE AEPB Inhale 1 puff into the lungs 2 (two) times daily.   Yes Historical Provider, MD  levalbuterol Penne Lash) 0.63 MG/3ML nebulizer solution Take 0.63 mg by nebulization every 4 (four) hours as needed for wheezing or shortness of breath.   Yes Historical Provider, MD  predniSONE (DELTASONE) 20 MG tablet Take 2  tablets (40 mg total) by mouth daily. 10/19/16  Yes Nance Pear, MD  senna (SENOKOT) 8.6 MG TABS tablet Take 1 tablet by mouth daily as needed for mild constipation.   Yes Historical Provider, MD  traMADol (ULTRAM) 50 MG tablet Take 50 mg by mouth every 6 (six) hours as needed. Reported on 03/14/2016   Yes Historical Provider, MD  amLODipine (NORVASC) 10 MG tablet Take 1 tablet (10 mg total) by mouth daily. Patient not taking: Reported on 01/05/2017 03/16/16   [name], MD  feeding supplement, ENSURE ENLIVE, (ENSURE ENLIVE) LIQD Take 237 mLs by mouth 2 (two) times daily between meals. 03/18/16   [name], MD  furosemide (LASIX) 20 MG tablet Take 1 tablet (20 mg total) by mouth daily. Patient not taking: Reported on 01/05/2017 03/18/16   [name], MD  lidocaine (LIDODERM) 5 % Place 1 patch onto the skin daily. Remove & Discard patch within 12 hours or as directed by MD Patient not taking: Reported on 01/05/2017 03/16/16   [name], MD  Menthol-Methyl Salicylate (MUSCLE RUB) 10-15 % CREA Apply 1 application topically as needed for muscle pain. Patient not taking: Reported on 01/05/2017 03/18/16   [name], MD  predniSONE (DELTASONE) 10 MG tablet Take 1 tablet (10 mg total) by mouth daily with breakfast. 60 mg PO (ORAL) x 2 days 50 mg PO (ORAL)  x 2 days 40 mg PO (ORAL)  x 2 days 30 mg PO  (ORAL)  x 2 days 20 mg PO  (ORAL) x 2 days 10 mg PO  (ORAL) x 2 days then stop Patient not taking: Reported on 01/05/2017 03/16/16   [name], MD      PHYSICAL EXAMINATION:   VITAL SIGNS: Blood pressure (!) 188/90, pulse 84, temperature 98.1 F (36.7 C), temperature source Oral, resp. rate (!) 24, height 5\' 4"  (1.626 m), weight 57.6 kg (127 lb), SpO2 96 %.  GENERAL:  83 y.o.-year-old patient Sitting in the bed with no acute distress.  EYES: Pupils equal, round, reactive to light and accommodation. No scleral icterus. Extraocular muscles intact.  HEENT: Head atraumatic, normocephalic. Oropharynx and  nasopharynx clear.  NECK:  Supple, no jugular venous distention. No thyroid enlargement, no tenderness.  LUNGS: Diminished breath sounds bilaterally, scattered wheezing, rales,rhonchi , but no crepitations. Intermittent use of accessory muscles of respiration.  CARDIOVASCULAR: S1, S2 normal. No murmurs, rubs, or gallops.  ABDOMEN: Soft, nontender, nondistended. Bowel sounds present. No organomegaly or mass.  EXTREMITIES: No pedal edema, cyanosis, or clubbing.  NEUROLOGIC: Cranial nerves II through XII are intact. Muscle strength 5/5 in all extremities. Sensation intact. Gait not checked.  PSYCHIATRIC: The patient is alert and oriented x 3.  SKIN: No obvious rash, lesion, or ulcer.   LABORATORY PANEL:   CBC No results for input(s): WBC, HGB, HCT, PLT, MCV, MCH, MCHC, RDW, LYMPHSABS, MONOABS, EOSABS, BASOSABS, BANDABS in the last 168 hours.  Invalid input(s): NEUTRABS, BANDSABD ------------------------------------------------------------------------------------------------------------------  Chemistries  No results for input(s): NA, K, CL, CO2, GLUCOSE, BUN, CREATININE, CALCIUM, MG, AST, ALT, ALKPHOS, BILITOT in the last 168 hours.  Invalid input(s): GFRCGP ------------------------------------------------------------------------------------------------------------------  Cardiac Enzymes No results for input(s): TROPONINI  in the last 168 hours. ------------------------------------------------------------------------------------------------------------------  RADIOLOGY: Dg Chest 2 View  Result Date: 01/05/2017 CLINICAL DATA:  Respiratory distress EXAM: CHEST  2 VIEW COMPARISON:  October 19, 2016 FINDINGS: Mild generalized interstitial thickening is stable. There is no appreciable edema or consolidation. Heart size is within normal limits. Pulmonary vascularity is normal. No adenopathy. There is aortic atherosclerosis as well as coronary artery calcification in the left anterior descending  and circumflex coronary arteries. Bones are osteoporotic. There is thoracolumbar dextroscoliosis with upper lumbar levoscoliosis. IMPRESSION: Chronic interstitial thickening without edema or consolidation. Heart size within normal limits. There is aortic atherosclerosis as well as extensive coronary artery calcification. Bones osteoporotic. Electronically Signed   By: Lowella Grip III M.D.   On: 01/05/2017 10:57    EKG: Orders placed or performed during the hospital encounter of 01/05/17  . EKG 12-Lead  . EKG 12-Lead  . EKG 12-Lead  . EKG 12-Lead   EKG in emergency room revealed sinus rhythm at 81 bpm, anterolateral infarct, old, nonspecific repolarization abnormalities in diffuse leads  IMPRESSION AND PLAN:  Active Problems:   COPD exacerbation ([facility])   COPD with acute exacerbation ([facility])   Essential hypertension, malignant   Dysphagia  #1. Dyspnea with wheezing of unclear etiology at this time, possibly related to COPD exacerbation, however, cannot rule out cardiac asthma, initiate patient on nebulizing therapy, Zithromax, get sputum cultures if possible, start Lasix, follow ins and outs, weight, get echocardiogram,  #2. Malignant essential hypertension, continue Norvasc, add lisinopril, advance medications as needed #3. Dysphagia, patient refuses any further workup including barium swallow study #4. Left lower extremity pain due to neuropathy, continue tramadol, add Lyrica, follow clinically   All the records are reviewed and case discussed with ED provider. Management plans discussed with the patient, family and they are in agreement.  CODE STATUS: Code Status History    Date Active Date Inactive Code Status Order ID Comments User Context   03/15/2016  2:25 AM 03/18/2016  9:49 PM Full Code [id number]  Saundra Shelling, MD Inpatient       TOTAL TIME TAKING CARE OF THIS PATIENT: 50 minutes.    Theodoro Grist M.D on 01/05/2017 at 2:14 PM  Between 7am to 6pm - Pager -  [phone] After 6pm go to [email or web address] - password [email or web address] [facility]  Office  [phone]  CC: Primary care physician; Madelyn Brunner, MD

## 2017-01-05 NOTE — ED Provider Notes (Signed)
[facility] Emergency Department Provider Note  ____________________________________________   First MD Initiated Contact with Patient 01/05/17 0957     (approximate)  I have reviewed the triage vital signs and the nursing notes.   HISTORY  Chief Complaint Respiratory Distress   HPI Janice Hoffman is a 83 y.o. female with a history of COPD who is presenting to the emergency department today with shortness of breath that started last night and is worsened this morning. She is denying any pain at this time. Says that she started a prescription for prednisone last night and has been taking albuterol through a nebulizer at home but without relief. She says that usually what helps when she arrives to the emergency department and gets a DuoNeb en route. There is a question from her son about whether she has been compliant with her Advair as well. Patient says that after DuoNeb that she feels back to her baseline breathing status.   Past Medical History:  Diagnosis Date  . Asthma   . COPD (chronic obstructive pulmonary disease) ([place])   . Herpes zoster   . Hypertension   . Neuralgia     Patient Active Problem List   Diagnosis Date Noted  . COPD exacerbation ([place]) 03/15/2016  . 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 ELEVATED BLOOD PRESSURE 05/12/2008  . HERPES ZOSTER 04/26/2008  . B12 DEFICIENCY 04/26/2008  . CONSTIPATION 04/26/2008  . LEG EDEMA, BILATERAL 04/26/2008    Past Surgical History:  Procedure Laterality Date  . LEG SURGERY      Prior to Admission medications   Medication Sig Start Date End Date Taking? Authorizing Provider  acetaminophen (TYLENOL) 325 MG tablet Take 650 mg by mouth every 6 (six) hours as needed.    Historical Provider, MD  albuterol (PROVENTIL HFA;VENTOLIN HFA) 108 (90 Base) MCG/ACT inhaler Inhale 2 puffs into the lungs every 6 (six) hours as needed for wheezing or shortness of breath.    Historical Provider, MD  albuterol  (PROVENTIL) (5 MG/ML) 0.5% nebulizer solution Take 2.5 mg by nebulization every 6 (six) hours as needed for wheezing or shortness of breath.    Historical Provider, MD  amLODipine (NORVASC) 10 MG tablet Take 1 tablet (10 mg total) by mouth daily. 03/16/16   [name], MD  feeding supplement, ENSURE ENLIVE, (ENSURE ENLIVE) LIQD Take 237 mLs by mouth 2 (two) times daily between meals. 03/18/16   [name], MD  Fluticasone-Salmeterol (ADVAIR) 500-50 MCG/DOSE AEPB Inhale 1 puff into the lungs 2 (two) times daily.    Historical Provider, MD  furosemide (LASIX) 20 MG tablet Take 1 tablet (20 mg total) by mouth daily. 03/18/16   [name], MD  levalbuterol (XOPENEX) 0.63 MG/3ML nebulizer solution Take 0.63 mg by nebulization every 4 (four) hours as needed for wheezing or shortness of breath.    Historical Provider, MD  levofloxacin (LEVAQUIN) 750 MG tablet Take 1 tablet (750 mg total) by mouth daily. 03/16/16   Sital Mody, MD  lidocaine (LIDODERM) 5 % Place 1 patch onto the skin daily. Remove & Discard patch within 12 hours or as directed by MD 03/16/16   [name], MD  Menthol-Methyl Salicylate (MUSCLE RUB) 10-15 % CREA Apply 1 application topically as needed for muscle pain. 03/18/16   [name], MD  oxyCODONE-acetaminophen (PERCOCET/ROXICET) 5-325 MG tablet Take 1 tablet by mouth every 6 (six) hours as needed for severe pain.    Historical Provider, MD  polyethylene glycol (MIRALAX / GLYCOLAX) packet Take 17 g  by mouth daily as needed.    Historical Provider, MD  predniSONE (DELTASONE) 10 MG tablet Take 1 tablet (10 mg total) by mouth daily with breakfast. 60 mg PO (ORAL) x 2 days 50 mg PO (ORAL)  x 2 days 40 mg PO (ORAL)  x 2 days 30 mg PO  (ORAL)  x 2 days 20 mg PO  (ORAL) x 2 days 10 mg PO  (ORAL) x 2 days then stop 03/16/16   [name], MD  predniSONE (DELTASONE) 20 MG tablet Take 2 tablets (40 mg total) by mouth daily. 10/19/16   Nance Pear, MD  traMADol (ULTRAM) 50 MG tablet Take 50 mg by mouth  every 6 (six) hours as needed. Reported on 03/14/2016    Historical Provider, MD    Allergies Nsaids; Penicillins; and Promethazine hcl  Family History  Problem Relation Age of Onset  . COPD Sister     Social History Social History  Substance Use Topics  . Smoking status: Never Smoker  . Smokeless tobacco: Never Used  . Alcohol use No    Review of Systems  Constitutional: No fever/chills Eyes: No visual changes. ENT: No sore throat. Cardiovascular: Denies chest pain. Respiratory: as above Gastrointestinal: No abdominal pain.  No nausea, no vomiting.  No diarrhea.  No constipation. Genitourinary: Negative for dysuria. Musculoskeletal: Negative for back pain. Skin: Negative for rash. Neurological: Negative for headaches, focal weakness or numbness.   ____________________________________________   PHYSICAL EXAM:  VITAL SIGNS: ED Triage Vitals  Enc Vitals Group     BP 01/05/17 0957 (!) 188/90     Pulse Rate 01/05/17 0957 84     Resp 01/05/17 0957 (!) 24     Temp 01/05/17 0957 98.1 F (36.7 C)     Temp Source 01/05/17 0957 Oral     SpO2 01/05/17 0957 96 %     Weight 01/05/17 0958 127 lb (57.6 kg)     Height 01/05/17 0958 5\' 4"  (1.626 m)     Head Circumference --      Peak Flow --      Pain Score --      Pain Loc --      Pain Edu? --      Excl. in [place]? --     Constitutional: Alert and oriented. Well appearing and in no acute distress. Eyes: Conjunctivae are normal. PERRL. EOMI. Head: Atraumatic. Nose: No congestion/rhinnorhea. Mouth/Throat: Mucous membranes are moist.   Neck: No stridor.   Cardiovascular: Normal rate, regular rhythm. Grossly normal heart sounds.   Respiratory: Normal respiratory effort.  No retractions. Scant wheezes throughout slightly increased on the right side. Patient speaking in full sentences. No retractions or labored respirations. Gastrointestinal: Soft and nontender. No distention. Musculoskeletal: No lower extremity tenderness nor  edema.  No joint effusions. Neurologic:  Normal speech and language. No gross focal neurologic deficits are appreciated.  Skin:  Skin is warm, dry and intact. No rash noted. Psychiatric: Mood and affect are normal. Speech and behavior are normal.  ____________________________________________   LABS (all labs ordered are listed, but only abnormal results are displayed)  Labs Reviewed - No data to display ____________________________________________  EKG  ED ECG REPORT I, Laurella Tull,  Youlanda Roys, the attending physician, personally viewed and interpreted this ECG.   Date: 01/05/2017  EKG Time: 0957  Rate: 81  Rhythm: nsr  Axis: normal  Intervals:none  ST&T Change: No ST segment elevation or depression. No abnormal T-wave inversion.  ____________________________________________  RADIOLOGY  DG Chest  2 View (Final result)  Result time 01/05/17 10:57:08  Final result by Lowella Grip III, MD (01/05/17 10:57:08)           Narrative:   CLINICAL DATA: Respiratory distress  EXAM: CHEST 2 VIEW  COMPARISON: October 19, 2016  FINDINGS: Mild generalized interstitial thickening is stable. There is no appreciable edema or consolidation. Heart size is within normal limits. Pulmonary vascularity is normal. No adenopathy. There is aortic atherosclerosis as well as coronary artery calcification in the left anterior descending and circumflex coronary arteries. Bones are osteoporotic. There is thoracolumbar dextroscoliosis with upper lumbar levoscoliosis.  IMPRESSION: Chronic interstitial thickening without edema or consolidation. Heart size within normal limits. There is aortic atherosclerosis as well as extensive coronary artery calcification. Bones osteoporotic.   Electronically Signed By: Lowella Grip III M.D. On: 01/05/2017 10:57          ____________________________________________   PROCEDURES  Procedure(s) performed:   Procedures  Critical  Care performed:   ____________________________________________   INITIAL IMPRESSION / ASSESSMENT AND PLAN / ED COURSE  Pertinent labs & imaging results that were available during my care of the patient were reviewed by me and considered in my medical decision making (see chart for details).  ----------------------------------------- 12:08 PM on 01/05/2017 -----------------------------------------  Patient now says that her left knee is hurting her. She normally takes tramadol for this. She says that she thinks that her COPD exacerbations are secondary to this knee pain. Re-auscultated the lungs and she now has wheezing throughout. Says that she also feels subjectively worse. She is already taken 20 mg of prednisone this morning. We will add an additional 40. To reassess. We will also give a dose of tramadol.    ----------------------------------------- 1:49 PM on 01/05/2017 -----------------------------------------  Patient, after tramadol, without any knee pain. However, at this point she is still wheezing and is tachypneic.  She says that she is feeling worse than when she came in. Will be admitted to the hospital. Signed out to Dr. Clayton Bibles. The patient as well as family are understanding of this plan and willing to comply. Patient explicitly said that she does not want a new plan for her neuropathic pain in the left lower extremity and that she has tried multiple medications and only tramadol works. I also discussed this with Dr. Clayton Bibles.  ____________________________________________   FINAL CLINICAL IMPRESSION(S) / ED DIAGNOSES  COPD exacerbation.    NEW MEDICATIONS STARTED DURING THIS VISIT:  New Prescriptions   No medications on file     Note:  This document was prepared using Dragon voice recognition software and may include unintentional dictation errors.    Orbie Pyo, MD 01/05/17 1350

## 2017-01-05 NOTE — Progress Notes (Signed)
Pt c/o SOB. Noted that pt had inhaler in room. Instructed pt that would need to send inhaler home with granddaughter due to we were given her the same medication in her nebulizer txs. Pt was very reluctant to give granddaughter the medication. Educated pt on the safety implications of overmedicating. Pt gave home med to granddaughter who took home with her. RT to give pt nebulizer tx soon

## 2017-01-05 NOTE — ED Triage Notes (Signed)
Pt arrived by EMS from home with c/o of respiratory distress. Per EMS pt was having difficulty breathing and appeared pale. Pt given 1 duoneb in route and pt states she feels better and her color appears normal.

## 2017-01-05 NOTE — ED Notes (Signed)
Spoke with family regarding medication administration of IV lasix and potassium coverage; pt refused and family member questioning why medication is necessary if there is no heart failure; Family member assumes that "we are trying to fix stuff that isn't there"; held medications and paged admitting MD

## 2017-01-05 NOTE — Progress Notes (Signed)
Pt out to nursing desk to "walk" only made it half way around before getting SOB. Assisted pt back to room. O2 sats 94% on room air HR 96. Pt requesting inhaler from home. Informed pt that her granddaughter took that home with her. Pt sitting in bed resp even and unlabored at this time. Auscultated BS faint wheezing heard in LLL but seems to be moving air much better than before. Pt reading newspaper will cont to monitor. No needs expressed at this time

## 2017-01-06 LAB — BASIC METABOLIC PANEL
Anion gap: 7 (ref 5–15)
BUN: 23 mg/dL — AB (ref 6–20)
CALCIUM: 9.1 mg/dL (ref 8.9–10.3)
CO2: 28 mmol/L (ref 22–32)
CREATININE: 0.53 mg/dL (ref 0.44–1.00)
Chloride: 100 mmol/L — ABNORMAL LOW (ref 101–111)
GFR calc Af Amer: >60 mL/min (ref >60)
GLUCOSE: 103 mg/dL — AB (ref 65–99)
POTASSIUM: 3.5 mmol/L (ref 3.5–5.1)
SODIUM: 135 mmol/L (ref 135–145)

## 2017-01-06 LAB — CBC
HEMATOCRIT: 34.5 % — AB (ref 35.0–47.0)
Hemoglobin: 11.7 g/dL — ABNORMAL LOW (ref 12.0–16.0)
MCH: 30 pg (ref 26.0–34.0)
MCHC: 33.9 g/dL (ref 32.0–36.0)
MCV: 88.4 fL (ref 80.0–100.0)
Platelets: 188 10*3/uL (ref 150–440)
RBC: 3.9 MIL/uL (ref 3.80–5.20)
RDW: 13.6 % (ref 11.5–14.5)
WBC: 5.9 10*3/uL (ref 3.6–11.0)

## 2017-01-06 MED ORDER — TIOTROPIUM BROMIDE MONOHYDRATE 18 MCG IN CAPS: 18.0000 ug | ORAL_CAPSULE | Freq: Every day | RESPIRATORY_TRACT | 0 refills | Status: DC

## 2017-01-06 MED ORDER — PREDNISONE 10 MG PO TABS: 10.0000 mg | ORAL_TABLET | Freq: Every day | ORAL | 0 refills | Status: DC

## 2017-01-06 MED ORDER — GUAIFENESIN ER 600 MG PO TB12: 600.0000 mg | ORAL_TABLET | Freq: Two times a day (BID) | ORAL | 0 refills | Status: DC

## 2017-01-06 MED ORDER — TROLAMINE SALICYLATE 10 % EX CREA
TOPICAL_CREAM | CUTANEOUS | Status: DC | PRN
  Filled 2017-01-06: qty 85

## 2017-01-06 MED ORDER — MUSCLE RUB 10-15 % EX CREA: TOPICAL_CREAM | CUTANEOUS | Status: DC | PRN

## 2017-01-06 MED ORDER — ALBUTEROL SULFATE (2.5 MG/3ML) 0.083% IN NEBU
2.5000 mg | INHALATION_SOLUTION | RESPIRATORY_TRACT | Status: DC | PRN
  Administered 2017-01-06: 2.5 mg via RESPIRATORY_TRACT
  Filled 2017-01-06: qty 3

## 2017-01-06 MED ORDER — ALBUTEROL SULFATE (2.5 MG/3ML) 0.083% IN NEBU: 2.5000 mg | INHALATION_SOLUTION | Freq: Four times a day (QID) | RESPIRATORY_TRACT | Status: DC

## 2017-01-06 MED ORDER — LISINOPRIL 5 MG PO TABS: 5.0000 mg | ORAL_TABLET | Freq: Every day | ORAL | 0 refills | Status: DC

## 2017-01-06 NOTE — Progress Notes (Signed)
Paged prime doc again to notify of pt request. Awaiting return call

## 2017-01-06 NOTE — Discharge Instructions (Signed)
Tiotropium inhalation powder What is this medicine? TIOTROPIUM (tee oh TRO pee um) is a bronchodilator. It helps open up the airways in your lungs to make it easier to breathe. This medicine is used to treat chronic obstructive pulmonary disease (COPD), including emphysema and chronic bronchitis. Do not use this medicine for an acute attack. This medicine may be used for other purposes; ask your health care provider or pharmacist if you have questions. COMMON BRAND NAME(S): Spiriva HandiHaler What should I tell my health care provider before I take this medicine? They need to know if you have any of these conditions: -bladder problems or difficulty passing urine -glaucoma -kidney disease -prostate trouble -an unusual or allergic reaction to tiotropium, ipratropium, atropine, other medicines, lactose, foods, dyes, or preservatives -pregnant or trying to get pregnant -breast-feeding How should I use this medicine? This medicine is used in a special inhaler. Do NOT swallow the capsules. Do NOT use a spacer device. Follow the directions on the prescription label. Take your medicine at regular intervals. Do not take it more often than directed. Do not stop taking except on your doctor's advice. Make sure that you are using your inhaler correctly. Ask your doctor or health care provider if you have any questions. Small pieces of the capsule may get in your mouth or throat when you breathe in your medicine. This is normal and should not hurt you. Talk to your pediatrician regarding the use of this medicine in children. Special care may be needed. Overdosage: If you think you have taken too much of this medicine contact a poison control center or emergency room at once. NOTE: This medicine is only for you. Do not share this medicine with others. What if I miss a dose? If you miss a dose, use it as soon as you can. If it is almost time for your next dose, use only that dose and continue with your regular  schedule. Do not use double or extra doses. What may interact with this medicine? This medicine may also interact with the following medications: -atropine -antihistamines for allergy, cough and cold -certain medicines for bladder problems like oxybutynin, tolterodine -certain medicines for stomach problems like dicyclomine, hyoscyamine -certain medicines for travel sickness like scopolamine -certain medicines for Parkinson's disease like benztropine, trihexyphenidyl -ipratropium This list may not describe all possible interactions. Give your health care provider a list of all the medicines, herbs, non-prescription drugs, or dietary supplements you use. Also tell them if you smoke, drink alcohol, or use illegal drugs. Some items may interact with your medicine. What should I watch for while using this medicine? Visit your doctor or health care professional for regular checks on your progress. Tell your doctor if your symptoms do not improve. Do not use more medicine than directed. If your symptoms get worse while you are using this medicine, call your doctor right away. Do not get the this medicine in your eyes. It can cause irritation, pain, or blurred vision. You may get dizzy. Do not drive, use machinery, or do anything that needs mental alertness until you know how this medicine affects you. Do not stand or sit up quickly, especially if you are an older patient. This reduces the risk of dizzy or fainting spells. Clean the inhaler as directed in the patient information sheet that comes with this medicine. What side effects may I notice from receiving this medicine? Side effects that you should report to your doctor or health care professional as soon as possible: -allergic reactions  like skin rash or hives, swelling of the face, lips, or tongue -breathing problems -changes in vision -chest pain -fast heartbeat -infection or flu-like symptoms -trouble passing urine or change in the amount  of urine Side effects that usually do not require medical attention (report to your doctor or health care professional if they continue or are bothersome): -constipation -cough -dizziness -dry mouth -headache -muscle pain -sore throat -stomach upset This list may not describe all possible side effects. Call your doctor for medical advice about side effects. You may report side effects to FDA at 1-800-FDA-1088. Where should I keep my medicine? Keep out of the reach of children. Store at room temperature between 15 and 30 degrees C (59 and 86 degrees F). Protect from humidity. Keep capsules in the foil pack until you are ready to use. Throw away any unused medicine after the expiration date. NOTE: This sheet is a summary. It may not cover all possible information. If you have questions about this medicine, talk to your doctor, pharmacist, or health care provider.  2018 Elsevier/Gold Standard (2014-06-08 13:19:07)   Fluticasone; Salmeterol inhalation aerosol What is this medicine? FLUTICASONE; SALMETEROL (floo TIK a sone; sal ME te role) inhalation is a combination of two medicines that decrease inflammation and help to open up the airways of your lungs. It is used to treat asthma. Do NOT use for an acute asthma attack. This medicine may be used for other purposes; ask your health care provider or pharmacist if you have questions. COMMON BRAND NAME(S): Advair HFA What should I tell my health care provider before I take this medicine? They need to know if you have any of these conditions: -bone problems -immune system problems -diabetes -heart disease or irregular heartbeat -high blood pressure -infection -pheochromocytoma -seizures -thyroid disease -worsening asthma -an unusual or allergic reaction to fluticasone; salmeterol, other corticosteroids, other medicines, foods, dyes, or preservatives -pregnant or trying to get pregnant -breast-feeding How should I use this  medicine? This medicine is inhaled through the mouth. Follow the directions on the prescription label. Shake well for 5 seconds before each use. After using the inhaler, rinse your mouth with water. Make sure not to swallow the water. Take your medicine at regular intervals. Do not take your medicine more often than directed. Do not stop taking except on your doctor's advice. Make sure that you are using your inhaler correctly. Ask you doctor or health care provider if you have any questions. A special MedGuide will be given to you by the pharmacist with each prescription and refill. Be sure to read this information carefully each time. Talk to your pediatrician regarding the use of this medicine in children. While this drug may be prescribed for children as young as 26 years of age for selected conditions, precautions do apply. Overdosage: If you think you have taken too much of this medicine contact a poison control center or emergency room at once. NOTE: This medicine is only for you. Do not share this medicine with others. What if I miss a dose? If you miss a dose, use it as soon as you remember. If it is almost time for your next dose, use only that dose and continue with your regular schedule, spacing doses evenly. Do not use double or extra doses. What may interact with this medicine? Do not take this medicine with any of the following medications: -MAOIs like Carbex, Eldepryl, Marplan, Nardil, and Parnate This medicine may also interact with the following medications: -aminophylline or theophylline -antiviral  medicines for HIV or AIDS -beta-blockers like metoprolol and propranolol -certain antibiotics like clarithromycin, erythromycin, levofloxacin, linezolid, and telithromycin -certain medicines for fungal infections like ketoconazole, itraconazole, posaconazole, voriconazole -conivaptan -diuretics -medicines for colds -medicines for depression or emotional  conditions -nefazodone -vaccines This list may not describe all possible interactions. Give your health care provider a list of all the medicines, herbs, non-prescription drugs, or dietary supplements you use. Also tell them if you smoke, drink alcohol, or use illegal drugs. Some items may interact with your medicine. What should I watch for while using this medicine? Visit your doctor for regular check ups. Tell your doctor or health care professional if your symptoms do not get better. Do not use this medicine more than every 12 hours. NEVER use this medicine for an acute asthma attack. You should use your short-acting rescue inhalers for this purpose. If your symptoms get worse or if you need your short-acting inhalers more often, call your doctor right away. This medicine may increase your risk of getting an infection. Tell your doctor or health care professional if you are around anyone with measles or chickenpox, or if you develop sores or blisters that do not heal properly. What side effects may I notice from receiving this medicine? Side effects that you should report to your doctor or health care professional as soon as possible: -allergic reactions like skin rash or hives, swelling of the face, lips, or tongue -changes in vision -chest pain -feeling faint or lightheaded, falls -fever or chills -irregular heartbeat Side effects that usually do not require medical attention (report to your doctor or health care professional if they continue or are bothersome): -coughing, hoarseness, throat irritation -headache -nervousness -stomach problems -stuffy nose -tremors This list may not describe all possible side effects. Call your doctor for medical advice about side effects. You may report side effects to FDA at 1-800-FDA-1088. Where should I keep my medicine? Keep out of the reach of children. Store at room temperature between 15 and 30 degrees C (59 and 86 degrees F). Store inhaler  with the mouthpiece down. Throw away the inhaler when the dose indicator reads 000, or after the expiration date, whichever comes first. NOTE: This sheet is a summary. It may not cover all possible information. If you have questions about this medicine, talk to your doctor, pharmacist, or health care provider.  2018 Elsevier/Gold Standard (2016-08-29 15:38:46)

## 2017-01-06 NOTE — Care Management Note (Signed)
Case Management Note  Patient Details  Name: Janice Hoffman MRN: [id number] Date of Birth: 07-May-1926  Subjective/Objective:   TC to son, Dr. Rock Nephew. Explained obs notice and the need for home health. He ask that I speak with his mother. Met with patient at bedside to discuss discharge planning. She is agreeable to home health nursing and PT. Refused a HHA. No agency preference. Referral to [facility] for home health for nursing and PT. Attending recommended palliative care consult in the home. Explained the purpose of palliative consult and what to expect. Patient is agreeable. Referral to [facility]. Patient lives alone. She uses a cane for ambulation. Has a walker but doesn't use it. Patient's son and dgt in law are both physicians and are patient support system. No additional needs identified. Case Closed.               Action/Plan:   Expected Discharge Date:  01/06/17               Expected Discharge Plan:  [place]  In-House Referral:     Discharge planning Services  CM Consult  Post Acute Care Choice:  Home Health Choice offered to:  Patient, Adult Children  DME Arranged:    DME Agency:     HH Arranged:  RN, PT [place] Agency:  [place] (now [facility])  Status of Service:  Completed, signed off  If discussed at [employer] of Stay Meetings, dates discussed:    Additional Comments:  Jolly Mango, RN 01/06/2017, 1:01 PM

## 2017-01-06 NOTE — Progress Notes (Signed)
MD paged for pt request of muscle rub cream for left knee that patient uses at home. Awaiting return call

## 2017-01-06 NOTE — Care Management Obs Status (Signed)
[place] NOTIFICATION   Patient Details  Name: DALASIA PREDMORE MRN: [id number] Date of Birth: [birth year]   Medicare Observation Status Notification Given:  Yes    Jolly Mango, RN 01/06/2017, 12:21 PM

## 2017-01-06 NOTE — Progress Notes (Signed)
Patient is alert and oriented and able to verbalize needs. No complaints of pain at this time. Vital signs stable. PIV removed. Discharge instructions gone over with patient and daughter in law. Hard script for prednisone and printed AVS given to patient at this time. Patient and daughter in law verbalize understanding of all discharge instructions and follow up care. No concerns voiced at this time. Daughter in law will transport patient home.

## 2017-01-07 LAB — HEMOGLOBIN A1C
Hgb A1c MFr Bld: 5.5 % (ref 4.8–5.6)
MEAN PLASMA GLUCOSE: 111 mg/dL

## 2017-01-07 NOTE — Discharge Summary (Addendum)
[facility] [place] NAME: Janice Hoffman    MR#:  [id number]  DATE OF BIRTH:  1926/05/05  DATE OF ADMISSION:  01/05/2017   ADMITTING PHYSICIAN: Theodoro Grist, MD  DATE OF DISCHARGE: 01/06/2017  1:43 PM  PRIMARY CARE PHYSICIAN: Madelyn Brunner, MD   ADMISSION DIAGNOSIS:  COPD exacerbation ([place]) [J44.1] DISCHARGE DIAGNOSIS:  Active Problems:   COPD exacerbation ([place])   COPD with acute exacerbation ([place])   Essential hypertension, malignant   Dysphagia  SECONDARY DIAGNOSIS:   Past Medical History:  Diagnosis Date  . Asthma   . COPD (chronic obstructive pulmonary disease) ([place])   . Herpes zoster   . Hypertension   . Neuralgia    HOSPITAL COURSE:   #1. COPD exacerbation: Likely due to medication noncompliance and or some viral URI, counseled about medication compliance.  Improving with steroids and antibiotics #2. Malignant essential hypertension: Resolved #3. Dysphagia, patient refuses any further workup including barium swallow study #4. Left lower extremity pain due to neuropathy, continue tramadol which is helping  DISCHARGE CONDITIONS:  Stable CONSULTS OBTAINED:   DRUG ALLERGIES:   Allergies  Allergen Reactions  . Nsaids Shortness Of Breath    Throat swelling, shortness of breath  . Penicillins   . Promethazine Hcl    DISCHARGE MEDICATIONS:   Allergies as of 01/06/2017      Reactions   Nsaids Shortness Of Breath   Throat swelling, shortness of breath   Penicillins    Promethazine Hcl       Medication List    TAKE these medications   acetaminophen 325 MG tablet Commonly known as:  TYLENOL Take 650 mg by mouth every 6 (six) hours as needed.   albuterol (5 MG/ML) 0.5% nebulizer solution Commonly known as:  PROVENTIL Take 2.5 mg by nebulization every 6 (six) hours as needed for wheezing or shortness of breath.   albuterol 108 (90 Base) MCG/ACT inhaler Commonly known as:  PROVENTIL HFA;VENTOLIN  HFA Inhale 2 puffs into the lungs every 6 (six) hours as needed for wheezing or shortness of breath.   amLODipine 10 MG tablet Commonly known as:  NORVASC Take 1 tablet (10 mg total) by mouth daily.   feeding supplement (ENSURE ENLIVE) Liqd Take 237 mLs by mouth 2 (two) times daily between meals.   Fluticasone-Salmeterol 500-50 MCG/DOSE Aepb Commonly known as:  ADVAIR Inhale 1 puff into the lungs 2 (two) times daily.   furosemide 20 MG tablet Commonly known as:  LASIX Take 1 tablet (20 mg total) by mouth daily.   guaiFENesin 600 MG 12 hr tablet Commonly known as:  MUCINEX Take 1 tablet (600 mg total) by mouth 2 (two) times daily.   levalbuterol 0.63 MG/3ML nebulizer solution Commonly known as:  XOPENEX Take 0.63 mg by nebulization every 4 (four) hours as needed for wheezing or shortness of breath.   lidocaine 5 % Commonly known as:  LIDODERM Place 1 patch onto the skin daily. Remove & Discard patch within 12 hours or as directed by MD   lisinopril 5 MG tablet Commonly known as:  PRINIVIL,ZESTRIL Take 1 tablet (5 mg total) by mouth daily.   MUSCLE RUB 10-15 % Crea Apply 1 application topically as needed for muscle pain.   predniSONE 10 MG tablet Commonly known as:  DELTASONE Take 1 tablet (10 mg total) by mouth daily with breakfast. 60 mg PO (ORAL) x 2 days 50 mg PO (  ORAL)  x 2 days 40 mg PO (ORAL)  x 2 days 30 mg PO  (ORAL)  x 2 days 20 mg PO  (ORAL) x 2 days 10 mg PO  (ORAL) x 2 days then stop What changed:  Another medication with the same name was removed. Continue taking this medication, and follow the directions you see here.   senna 8.6 MG Tabs tablet Commonly known as:  SENOKOT Take 1 tablet by mouth daily as needed for mild constipation.   tiotropium 18 MCG inhalation capsule Commonly known as:  SPIRIVA Place 1 capsule (18 mcg total) into inhaler and inhale daily.   traMADol 50 MG tablet Commonly known as:  ULTRAM Take 50 mg by mouth every 6 (six) hours as  needed. Reported on 03/14/2016      DISCHARGE INSTRUCTIONS:   DIET:  Cardiac diet DISCHARGE CONDITION:  Stable ACTIVITY:  Activity as tolerated OXYGEN:  Home Oxygen: No.  Oxygen Delivery: room air DISCHARGE LOCATION:  home with home health -palliative care evaluation while at home  If you experience worsening of your admission symptoms, develop shortness of breath, life threatening emergency, suicidal or homicidal thoughts you must seek medical attention immediately by calling 911 or calling your MD immediately  if symptoms less severe.  You Must read complete instructions/literature along with all the possible adverse reactions/side effects for all the Medicines you take and that have been prescribed to you. Take any new Medicines after you have completely understood and accpet all the possible adverse reactions/side effects.   Please note  You were cared for by a hospitalist during your hospital stay. If you have any questions about your discharge medications or the care you received while you were in the hospital after you are discharged, you can call the unit and asked to speak with the hospitalist on call if the hospitalist that took care of you is not available. Once you are discharged, your primary care physician will handle any further medical issues. Please note that NO REFILLS for any discharge medications will be authorized once you are discharged, as it is imperative that you return to your primary care physician (or establish a relationship with a primary care physician if you do not have one) for your aftercare needs so that they can reassess your need for medications and monitor your lab values.    On the day of Discharge:  VITAL SIGNS:  Blood pressure (!) 164/81, pulse 78, temperature 98.8 F (37.1 C), temperature source Oral, resp. rate 20, height 5\' 4"  (1.626 m), weight 57.6 kg (127 lb), SpO2 96 %. PHYSICAL EXAMINATION:  GENERAL:  83 y.o.-year-old patient lying in the  bed with no acute distress.  EYES: Pupils equal, round, reactive to light and accommodation. No scleral icterus. Extraocular muscles intact.  HEENT: Head atraumatic, normocephalic. Oropharynx and nasopharynx clear.  NECK:  Supple, no jugular venous distention. No thyroid enlargement, no tenderness.  LUNGS: Normal breath sounds bilaterally, no wheezing, rales,rhonchi or crepitation. No use of accessory muscles of respiration.  CARDIOVASCULAR: S1, S2 normal. No murmurs, rubs, or gallops.  ABDOMEN: Soft, non-tender, non-distended. Bowel sounds present. No organomegaly or mass.  EXTREMITIES: No pedal edema, cyanosis, or clubbing.  NEUROLOGIC: Cranial nerves II through XII are intact. Muscle strength 5/5 in all extremities. Sensation intact. Gait not checked.  PSYCHIATRIC: The patient is alert and oriented x 3.  SKIN: No obvious rash, lesion, or ulcer.  DATA REVIEW:   CBC  Recent Labs Lab 01/06/17 0401  WBC 5.9  HGB 11.7*  HCT 34.5*  PLT 188    Chemistries   Recent Labs Lab 01/06/17 0401  NA 135  K 3.5  CL 100*  CO2 28  GLUCOSE 103*  BUN 23*  CREATININE 0.53  CALCIUM 9.1    Follow-up Information    Madelyn Brunner, MD. Schedule an appointment as soon as possible for a visit on 01/13/2017.   Specialty:  Internal Medicine Why:  @ 1:30 pm Contact information: [place] [address] [phone]          Management plans discussed with the patient, family and they are in agreement.  CODE STATUS: Prior   TOTAL TIME TAKING CARE OF THIS PATIENT: 45 minutes.    Max Sane M.D on 01/07/2017 at 4:40 PM  Between 7am to 6pm - Pager - [phone]  After 6pm go to [email or web address] - Proofreader  [facility]  Office  [phone]  CC: Primary care physician; Madelyn Brunner, MD   Note: This dictation was prepared with Dragon dictation along with smaller phrase technology. Any  transcriptional errors that result from this process are unintentional.

## 2017-05-31 ENCOUNTER — Encounter: Payer: Self-pay | Admitting: Emergency Medicine

## 2017-05-31 ENCOUNTER — Emergency Department: Payer: Medicare Other

## 2017-05-31 ENCOUNTER — Observation Stay
Admission: EM | Admit: 2017-05-31 | Discharge: 2017-06-01 | Disposition: A | Discharge status: Home / Self Care | Payer: Medicare Other | Attending: Internal Medicine | Admitting: Internal Medicine

## 2017-05-31 ENCOUNTER — Observation Stay: Payer: Medicare Other

## 2017-05-31 DIAGNOSIS — J449 Chronic obstructive pulmonary disease, unspecified: Secondary | ICD-10-CM | POA: Diagnosis not present

## 2017-05-31 DIAGNOSIS — Z7951 Long term (current) use of inhaled steroids: Secondary | ICD-10-CM | POA: Insufficient documentation

## 2017-05-31 DIAGNOSIS — I10 Essential (primary) hypertension: Secondary | ICD-10-CM | POA: Diagnosis not present

## 2017-05-31 DIAGNOSIS — B0229 Other postherpetic nervous system involvement: Secondary | ICD-10-CM | POA: Insufficient documentation

## 2017-05-31 DIAGNOSIS — D329 Benign neoplasm of meninges, unspecified: Secondary | ICD-10-CM | POA: Diagnosis not present

## 2017-05-31 DIAGNOSIS — R55 Syncope and collapse: Principal | ICD-10-CM | POA: Diagnosis present

## 2017-05-31 DIAGNOSIS — R42 Dizziness and giddiness: Secondary | ICD-10-CM | POA: Diagnosis present

## 2017-05-31 LAB — BASIC METABOLIC PANEL WITH GFR
Anion gap: 8 (ref 5–15)
BUN: 13 mg/dL (ref 6–20)
CO2: 29 mmol/L (ref 22–32)
Calcium: 9.5 mg/dL (ref 8.9–10.3)
Chloride: 98 mmol/L — ABNORMAL LOW (ref 101–111)
Creatinine, Ser: 0.51 mg/dL (ref 0.44–1.00)
GFR calc Af Amer: >60 mL/min
GFR calc non Af Amer: >60 mL/min
Glucose, Bld: 98 mg/dL (ref 65–99)
Potassium: 3.6 mmol/L (ref 3.5–5.1)
Sodium: 135 mmol/L (ref 135–145)

## 2017-05-31 LAB — URINALYSIS, COMPLETE (UACMP) WITH MICROSCOPIC
BACTERIA UA: NONE SEEN
Bilirubin Urine: NEGATIVE
Glucose, UA: NEGATIVE mg/dL
Hgb urine dipstick: NEGATIVE
Ketones, ur: 5 mg/dL — AB
Leukocytes, UA: NEGATIVE
Nitrite: NEGATIVE
PH: 7 (ref 5.0–8.0)
Protein, ur: NEGATIVE mg/dL
RBC / HPF: NONE SEEN RBC/hpf (ref 0–5)
SPECIFIC GRAVITY, URINE: 1.008 (ref 1.005–1.030)
SQUAMOUS EPITHELIAL / LPF: NONE SEEN
WBC, UA: NONE SEEN WBC/hpf (ref 0–5)

## 2017-05-31 LAB — CBC
HEMATOCRIT: 40.5 % (ref 35.0–47.0)
Hemoglobin: 13.9 g/dL (ref 12.0–16.0)
MCH: 30.8 pg (ref 26.0–34.0)
MCHC: 34.3 g/dL (ref 32.0–36.0)
MCV: 89.9 fL (ref 80.0–100.0)
Platelets: 193 10*3/uL (ref 150–440)
RBC: 4.5 MIL/uL (ref 3.80–5.20)
RDW: 14.1 % (ref 11.5–14.5)
WBC: 6.4 10*3/uL (ref 3.6–11.0)

## 2017-05-31 LAB — TROPONIN I
Troponin I: <0.03 ng/mL (ref <0.03)
Troponin I: <0.03 ng/mL (ref <0.03)

## 2017-05-31 MED ORDER — SENNA 8.6 MG PO TABS: 1.0000 | ORAL_TABLET | Freq: Every day | ORAL | Status: DC | PRN

## 2017-05-31 MED ORDER — ENOXAPARIN SODIUM 40 MG/0.4ML ~~LOC~~ SOLN
40.0000 mg | SUBCUTANEOUS | Status: DC
  Administered 2017-05-31: 21:00:00 40 mg via SUBCUTANEOUS
  Filled 2017-05-31: qty 0.4

## 2017-05-31 MED ORDER — LIDOCAINE 5 % EX PTCH
1.0000 | MEDICATED_PATCH | CUTANEOUS | Status: DC
  Administered 2017-05-31: 1 via TRANSDERMAL
  Filled 2017-05-31 (×2): qty 1

## 2017-05-31 MED ORDER — FUROSEMIDE 20 MG PO TABS
20.0000 mg | ORAL_TABLET | Freq: Every day | ORAL | Status: DC
  Administered 2017-05-31 – 2017-06-01 (×2): 20 mg via ORAL
  Filled 2017-05-31 (×3): qty 1

## 2017-05-31 MED ORDER — ENSURE ENLIVE PO LIQD: 237.0000 mL | Freq: Two times a day (BID) | ORAL | Status: DC

## 2017-05-31 MED ORDER — SODIUM CHLORIDE 0.9 % IV BOLUS (SEPSIS)
1000.0000 mL | Freq: Once | INTRAVENOUS | Status: AC
  Administered 2017-05-31: 1000 mL via INTRAVENOUS

## 2017-05-31 MED ORDER — ALBUTEROL SULFATE HFA 108 (90 BASE) MCG/ACT IN AERS: 2.0000 | INHALATION_SPRAY | Freq: Four times a day (QID) | RESPIRATORY_TRACT | Status: DC | PRN

## 2017-05-31 MED ORDER — SODIUM CHLORIDE 0.9% FLUSH
3.0000 mL | Freq: Two times a day (BID) | INTRAVENOUS | Status: DC
  Administered 2017-05-31: 3 mL via INTRAVENOUS

## 2017-05-31 MED ORDER — TIOTROPIUM BROMIDE MONOHYDRATE 18 MCG IN CAPS
18.0000 ug | ORAL_CAPSULE | Freq: Every day | RESPIRATORY_TRACT | Status: DC
  Administered 2017-05-31 – 2017-06-01 (×2): 18 ug via RESPIRATORY_TRACT
  Filled 2017-05-31: qty 5

## 2017-05-31 MED ORDER — MECLIZINE HCL 25 MG PO TABS
12.5000 mg | ORAL_TABLET | Freq: Once | ORAL | Status: AC
  Administered 2017-05-31: 12.5 mg via ORAL
  Filled 2017-05-31: qty 1

## 2017-05-31 MED ORDER — ONDANSETRON HCL 4 MG PO TABS: 4.0000 mg | ORAL_TABLET | Freq: Four times a day (QID) | ORAL | Status: DC | PRN

## 2017-05-31 MED ORDER — TRAMADOL HCL 50 MG PO TABS
50.0000 mg | ORAL_TABLET | Freq: Four times a day (QID) | ORAL | Status: DC | PRN
  Administered 2017-05-31: 50 mg via ORAL
  Filled 2017-05-31: qty 1

## 2017-05-31 MED ORDER — ALBUTEROL SULFATE (2.5 MG/3ML) 0.083% IN NEBU
5.0000 mg | INHALATION_SOLUTION | Freq: Once | RESPIRATORY_TRACT | Status: AC
  Administered 2017-05-31: 5 mg via RESPIRATORY_TRACT
  Filled 2017-05-31: qty 6

## 2017-05-31 MED ORDER — LISINOPRIL 5 MG PO TABS
5.0000 mg | ORAL_TABLET | Freq: Every day | ORAL | Status: DC
  Administered 2017-05-31 – 2017-06-01 (×2): 5 mg via ORAL
  Filled 2017-05-31 (×2): qty 1

## 2017-05-31 MED ORDER — ONDANSETRON HCL 4 MG/2ML IJ SOLN
4.0000 mg | Freq: Once | INTRAMUSCULAR | Status: AC
  Administered 2017-05-31: 4 mg via INTRAVENOUS
  Filled 2017-05-31: qty 2

## 2017-05-31 MED ORDER — ALBUTEROL SULFATE (2.5 MG/3ML) 0.083% IN NEBU: 2.5000 mg | INHALATION_SOLUTION | Freq: Four times a day (QID) | RESPIRATORY_TRACT | Status: DC | PRN

## 2017-05-31 MED ORDER — ACETAMINOPHEN 325 MG PO TABS: 650.0000 mg | ORAL_TABLET | Freq: Four times a day (QID) | ORAL | Status: DC | PRN

## 2017-05-31 MED ORDER — ONDANSETRON HCL 4 MG/2ML IJ SOLN: 4.0000 mg | Freq: Four times a day (QID) | INTRAMUSCULAR | Status: DC | PRN

## 2017-05-31 MED ORDER — IPRATROPIUM-ALBUTEROL 0.5-2.5 (3) MG/3ML IN SOLN
3.0000 mL | Freq: Four times a day (QID) | RESPIRATORY_TRACT | Status: DC | PRN
  Administered 2017-05-31: 18:00:00 3 mL via RESPIRATORY_TRACT
  Filled 2017-05-31: qty 3

## 2017-05-31 MED ORDER — MECLIZINE HCL 12.5 MG PO TABS
12.5000 mg | ORAL_TABLET | Freq: Three times a day (TID) | ORAL | Status: DC | PRN
  Administered 2017-06-01: 09:00:00 12.5 mg via ORAL
  Filled 2017-05-31 (×2): qty 1

## 2017-05-31 MED ORDER — DOXYCYCLINE HYCLATE 100 MG PO TABS
100.0000 mg | ORAL_TABLET | Freq: Two times a day (BID) | ORAL | Status: DC
  Administered 2017-05-31 – 2017-06-01 (×2): 100 mg via ORAL
  Filled 2017-05-31 (×3): qty 1

## 2017-05-31 MED ORDER — ALBUTEROL SULFATE (5 MG/ML) 0.5% IN NEBU
2.5000 mg | INHALATION_SOLUTION | RESPIRATORY_TRACT | Status: DC | PRN
  Filled 2017-05-31: qty 0.5

## 2017-05-31 MED ORDER — METOPROLOL TARTRATE 5 MG/5ML IV SOLN: 5.0000 mg | INTRAVENOUS | Status: DC | PRN

## 2017-05-31 MED ORDER — MOMETASONE FURO-FORMOTEROL FUM 200-5 MCG/ACT IN AERO
2.0000 | INHALATION_SPRAY | Freq: Two times a day (BID) | RESPIRATORY_TRACT | Status: DC
  Administered 2017-05-31 – 2017-06-01 (×2): 2 via RESPIRATORY_TRACT
  Filled 2017-05-31: qty 8.8

## 2017-05-31 MED ORDER — ALBUTEROL SULFATE (2.5 MG/3ML) 0.083% IN NEBU: 2.5000 mg | INHALATION_SOLUTION | RESPIRATORY_TRACT | Status: DC | PRN

## 2017-05-31 MED ORDER — AMLODIPINE BESYLATE 10 MG PO TABS
10.0000 mg | ORAL_TABLET | Freq: Every day | ORAL | Status: DC
  Administered 2017-05-31 – 2017-06-01 (×2): 10 mg via ORAL
  Filled 2017-05-31 (×2): qty 1

## 2017-05-31 MED ORDER — ALBUTEROL SULFATE (5 MG/ML) 0.5% IN NEBU
2.5000 mg | INHALATION_SOLUTION | Freq: Four times a day (QID) | RESPIRATORY_TRACT | Status: DC | PRN
  Filled 2017-05-31: qty 0.5

## 2017-05-31 MED ORDER — LORAZEPAM 2 MG/ML IJ SOLN
0.5000 mg | Freq: Once | INTRAMUSCULAR | Status: AC | PRN
  Administered 2017-05-31: 0.5 mg via INTRAVENOUS
  Filled 2017-05-31: qty 1

## 2017-05-31 MED ORDER — SODIUM CHLORIDE 0.9 % IV SOLN
INTRAVENOUS | Status: AC
  Administered 2017-05-31: 18:00:00 via INTRAVENOUS

## 2017-05-31 NOTE — ED Notes (Signed)
Contact information for family:  [phone]  Izora Gala)   [phone] [name])

## 2017-05-31 NOTE — ED Triage Notes (Signed)
Pt arrived via EMS from home with son. Per EMS family concerned about increased dizziness over the past several weeks. Family reported to EMS pt's dizziness is worse at night. Pt recently on doxycycline for head wound x 10 days. Pt c/o weeping wound at home.  Pt was seen at [facility] for head wound.  Pt states she is unsure what happened. Pt takes meclizine at home.

## 2017-05-31 NOTE — H&P (Signed)
[facility] [place] NAME: Janice Hoffman    MR#:  [id number]  DATE OF BIRTH:  1926/03/29  DATE OF ADMISSION:  05/31/2017  PRIMARY CARE PHYSICIAN: Madelyn Brunner, MD   REQUESTING/REFERRING PHYSICIAN:  CHIEF COMPLAINT:   dizziness  HISTORY OF PRESENT ILLNESS:  Alexsia Klindt  is a 83 y.o. female with a known history ofCOPD, postherpetic neuralgia, hypertension and other medical problems is presenting to the ED with a chief complaint of lightheadedness. Reports she is lightheaded for 2 weeks and whenever she stands up she is feeling dizzy and unable to ambulate. Denies room spinning around her. Patient was started by meclizine by her primary care physician. Taking meclizine helped her in the past according to the daughter-in-law. CT head is negative MRI brain with no acute findings but has meningioma. Patient denies any chest pain or shortness of breath. Denies any headache or blurry vision. No other symptoms. During my examination she is reporting that she didn't eat last night and she is feeling hungry   PAST MEDICAL HISTORY:   Past Medical History:  Diagnosis Date  . Asthma   . COPD (chronic obstructive pulmonary disease) ([place])   . Herpes zoster   . Hypertension   . Neuralgia     PAST SURGICAL HISTOIRY:   Past Surgical History:  Procedure Laterality Date  . LEG SURGERY      SOCIAL HISTORY:   Social History  Substance Use Topics  . Smoking status: Never Smoker  . Smokeless tobacco: Never Used  . Alcohol use No    FAMILY HISTORY:   Family History  Problem Relation Age of Onset  . COPD Sister     DRUG ALLERGIES:   Allergies  Allergen Reactions  . Nsaids Shortness Of Breath    Throat swelling, shortness of breath  . Penicillins     Has patient had a PCN reaction causing immediate rash, facial/tongue/throat swelling, SOB or lightheadedness with hypotension: Unknown Has patient had a PCN reaction causing severe  rash involving mucus membranes or skin necrosis: Unknown Has patient had a PCN reaction that required hospitalization: Unknown Has patient had a PCN reaction occurring within the last 10 years: Unknown If all of the above answers are "NO", then may proceed with Cephalosporin use.   . Promethazine Hcl     REVIEW OF SYSTEMS:  CONSTITUTIONAL: No fever, fatigue or weakness.  EYES: No blurred or double vision.  EARS, NOSE, AND THROAT: No tinnitus or ear pain.  RESPIRATORY: No cough, shortness of breath, wheezing or hemoptysis.  CARDIOVASCULAR:Patient is feeling lightheaded but denies any loss of consciousness  No chest pain, orthopnea, edema.  GASTROINTESTINAL: No nausea, vomiting, diarrhea or abdominal pain.  GENITOURINARY: No dysuria, hematuria.  ENDOCRINE: No polyuria, nocturia,  HEMATOLOGY: No anemia, easy bruising or bleeding SKIN: No rash or lesion. MUSCULOSKELETAL: No joint pain or arthritis.   NEUROLOGIC: No tingling, numbness, weakness.  PSYCHIATRY: No anxiety or depression.   MEDICATIONS AT HOME:   Prior to Admission medications   Medication Sig Start Date End Date Taking? Authorizing Provider  acetaminophen (TYLENOL) 325 MG tablet Take 650 mg by mouth every 6 (six) hours as needed.   Yes [provider]  albuterol (PROVENTIL HFA;VENTOLIN HFA) 108 (90 Base) MCG/ACT inhaler Inhale 2 puffs into the lungs every 6 (six) hours as needed for wheezing or shortness of breath.   Yes [provider]  albuterol (PROVENTIL) (5 MG/ML) 0.5% nebulizer solution Take 2.5 mg  by nebulization every 6 (six) hours as needed for wheezing or shortness of breath.   Yes [provider]  doxycycline (VIBRA-TABS) 100 MG tablet Take 100 mg by mouth 2 (two) times daily. 05/29/17 06/08/17 Yes [provider]  Fluticasone-Salmeterol (ADVAIR) 500-50 MCG/DOSE AEPB Inhale 1 puff into the lungs 2 (two) times daily.   Yes [provider]  levalbuterol (XOPENEX) 0.63 MG/3ML  nebulizer solution Take 0.63 mg by nebulization every 4 (four) hours as needed for wheezing or shortness of breath.   Yes [provider]  meclizine (ANTIVERT) 12.5 MG tablet Take 12.5 mg by mouth 3 (three) times daily as needed for dizziness.   Yes [provider]  senna (SENOKOT) 8.6 MG TABS tablet Take 1 tablet by mouth daily as needed for mild constipation.   Yes [provider]  tiotropium (SPIRIVA) 18 MCG inhalation capsule Place 1 capsule (18 mcg total) into inhaler and inhale daily. 01/07/17  Yes Max Sane, MD  traMADol (ULTRAM) 50 MG tablet Take 50 mg by mouth every 6 (six) hours as needed. Reported on 03/14/2016   Yes [provider]  amLODipine (NORVASC) 10 MG tablet Take 1 tablet (10 mg total) by mouth daily. Patient not taking: Reported on 01/05/2017 03/16/16   [name], MD  feeding supplement, ENSURE ENLIVE, (ENSURE ENLIVE) LIQD Take 237 mLs by mouth 2 (two) times daily between meals. 03/18/16   [name], MD  furosemide (LASIX) 20 MG tablet Take 1 tablet (20 mg total) by mouth daily. Patient not taking: Reported on 01/05/2017 03/18/16   [name], MD  guaiFENesin (MUCINEX) 600 MG 12 hr tablet Take 1 tablet (600 mg total) by mouth 2 (two) times daily. Patient not taking: Reported on 05/31/2017 01/06/17   Max Sane, MD  lidocaine (LIDODERM) 5 % Place 1 patch onto the skin daily. Remove & Discard patch within 12 hours or as directed by MD Patient not taking: Reported on 01/05/2017 03/16/16   [name], MD  lisinopril (PRINIVIL,ZESTRIL) 5 MG tablet Take 1 tablet (5 mg total) by mouth daily. Patient not taking: Reported on 05/31/2017 01/07/17   Max Sane, MD  Menthol-Methyl Salicylate (MUSCLE RUB) 10-15 % CREA Apply 1 application topically as needed for muscle pain. Patient not taking: Reported on 01/05/2017 03/18/16   [name], MD  predniSONE (DELTASONE) 10 MG tablet Take 1 tablet (10 mg total) by mouth daily with breakfast. 60 mg PO (ORAL) x 2 days 50  mg PO (ORAL)  x 2 days 40 mg PO (ORAL)  x 2 days 30 mg PO  (ORAL)  x 2 days 20 mg PO  (ORAL) x 2 days 10 mg PO  (ORAL) x 2 days then stop Patient not taking: Reported on 05/31/2017 01/06/17   Max Sane, MD      VITAL SIGNS:  Blood pressure (!) 167/80, pulse 96, temperature 98.3 F (36.8 C), temperature source Oral, resp. rate 20, height 5\' 4"  (1.626 m), weight 54.6 kg (120 lb 4.8 oz), SpO2 96 %.  PHYSICAL EXAMINATION:  GENERAL:  83 y.o.-year-old patient lying in the bed with no acute distress.  EYES: Pupils equal, round, reactive to light and accommodation. No scleral icterus. Extraocular muscles intact.  HEENT: Head atraumatic, normocephalic. Oropharynx and nasopharynx clear.  NECK:  Supple, no jugular venous distention. No thyroid enlargement, no tenderness.  LUNGS: Normal breath sounds bilaterally, no wheezing, rales,rhonchi or crepitation. No use of accessory muscles of respiration.  CARDIOVASCULAR: S1, S2 normal. No murmurs, rubs, or gallops.  ABDOMEN: Soft, nontender, nondistended. Bowel sounds present. No organomegaly or mass.  EXTREMITIES: No pedal edema, cyanosis, or clubbing.  NEUROLOGIC: Cranial nerves II through XII are intact. Muscle strength 5/5 in all extremities. Sensation intact. Gait not checked.  PSYCHIATRIC: The patient is alert and oriented x 3.  SKIN: No obvious rash, lesion, or ulcer.   LABORATORY PANEL:   CBC  Recent Labs Lab 05/31/17 0820  WBC 6.4  HGB 13.9  HCT 40.5  PLT 193   ------------------------------------------------------------------------------------------------------------------  Chemistries   Recent Labs Lab 05/31/17 0820  NA 135  K 3.6  CL 98*  CO2 29  GLUCOSE 98  BUN 13  CREATININE 0.51  CALCIUM 9.5   ------------------------------------------------------------------------------------------------------------------  Cardiac Enzymes  Recent Labs Lab 05/31/17 0820  TROPONINI <0.03    ------------------------------------------------------------------------------------------------------------------  RADIOLOGY:  Dg Chest 2 View  Result Date: 05/31/2017 CLINICAL DATA:  Increasing dizziness and wheezing EXAM: CHEST  2 VIEW COMPARISON:  01/05/2017 FINDINGS: Cardiac shadow is stable. Aortic calcifications are again seen. Lungs are hyperaerated bilaterally consistent with COPD. No focal infiltrate or sizable effusion is seen. Chronic bronchitic changes are again noted. No acute bony abnormality is seen. IMPRESSION: No acute abnormality noted. Aortic Atherosclerosis (ICD10-I70.0) and Emphysema (ICD10-J43.9). Electronically Signed   By: Inez Catalina M.D.   On: 05/31/2017 09:57   Ct Head Wo Contrast  Result Date: 05/31/2017 CLINICAL DATA:  Vertigo, increasing dizziness over the past several weeks. EXAM: CT HEAD WITHOUT CONTRAST TECHNIQUE: Contiguous axial images were obtained from the base of the skull through the vertex without intravenous contrast. COMPARISON:  Head CT dated 11/10/2014. FINDINGS: Brain: Generalized age related parenchymal atrophy with commensurate dilatation of the ventricles and sulci. Confluent chronic small vessel ischemic changes within the bilateral periventricular and subcortical white matter regions. No mass, hemorrhage, edema or other evidence of acute parenchymal abnormality. No extra-axial hemorrhage. 12 mm meningioma noted at the right frontal lobe vertex. Vascular: There are chronic calcified atherosclerotic changes of the large vessels at the skull base. No unexpected hyperdense vessel. Skull: Normal. Negative for fracture or focal lesion. Sinuses/Orbits: Chronic mucosal thickening throughout the ethmoid air cells. Chronic appearing mucosal thickening within the sphenoid sinuses, but increased compared to earlier studies. Mild mucosal thickening within the upper maxillary sinuses, incompletely imaged, chronic appearing. Periorbital and retro-orbital soft tissues  are unremarkable. Other: Mastoid air cells are clear. External auditory canals and middle ear cavities appear clear bilaterally. IMPRESSION: 1. No acute intracranial abnormality. No parenchymal mass, hemorrhage or edema. Incidental note made of a small meningioma at the right frontal lobe vertex. 2. Atrophy and chronic small vessel ischemic changes in the white matter. 3. Diffuse paranasal sinus disease, most likely chronic. Electronically Signed   By: Franki Cabot M.D.   On: 05/31/2017 09:38   Mr Brain Wo Contrast  Result Date: 05/31/2017 CLINICAL DATA:  83 year old female with increased episodic dizziness over the past several weeks. Currently on doxycycline for a draining head wound. EXAM: MRI HEAD WITHOUT CONTRAST TECHNIQUE: Multiplanar, multiecho pulse sequences of the brain and surrounding structures were obtained without intravenous contrast. COMPARISON:  Head CT without contrast 0926 hours today, and earlier. FINDINGS: Brain: No restricted diffusion or evidence of acute infarction. Patchy and confluent bilateral cerebral white matter and deep gray matter nuclei T2 and FLAIR hyperintensity. Small chronic lacunar infarcts in both cerebellar hemispheres and the pons. Chronic microhemorrhage in the left thalamus. No cortical encephalomalacia identified. Small 12 x 14 mm round extra-axial mass at the right vertex abutting the interhemispheric  fissure (series 8, image 45 and series 11, image 11, corresponding to the hyperdense lesion seen by CT today and consistent with a small meningioma. Mild mass effect on the adjacent right superior frontal gyrus, but no frontal lobe edema. No other intracranial mass effect or mass lesion. No ventriculomegaly, extra-axial fluid collection or acute intracranial hemorrhage. Cervicomedullary junction and pituitary are within normal limits. Vascular: Major intracranial vascular flow voids are preserved. Skull and upper cervical spine: Advanced multilevel cervical spine  degeneration with partially visible spinal stenosis (series 2, image 12). Sinuses/Orbits: Postoperative changes to both globes, other orbits soft tissues are within normal limits. Diffuse paranasal sinus mucosal thickening. Other: Visible internal auditory structures appear normal. Mastoid air cells are clear. No scalp soft tissue wound is identified. IMPRESSION: 1.  No acute intracranial abnormality. 2. A 14 mm right parasagittal meningioma with no associated cerebral edema is likely clinically silent. 3. Moderately advanced chronic small vessel disease. 4. Pansinus paranasal sinus inflammation. 5. Advanced cervical spine degeneration with spinal stenosis. Electronically Signed   By: Genevie Ann M.D.   On: 05/31/2017 14:28    EKG:   Orders placed or performed during the hospital encounter of 05/31/17  . EKG 12-Lead  . EKG 12-Lead  . ED EKG  . ED EKG  . EKG 12-Lead  . EKG 12-Lead  . ED EKG  . ED EKG    IMPRESSION AND PLAN:   Janice Hoffman  is a 83 y.o. female with a known history ofCOPD, postherpetic neuralgia, hypertension and other medical problems is presenting to the ED with a chief complaint of lightheadedness. Reports she is lightheaded for 2 weeks and whenever she stands up she is feeling dizzy and unable to ambulate. Denies room spinning around her. Patient was started by meclizine by her primary care physician. Taking meclizine helped her in the past according to the daughter-in-law. CT head is negative MRI brain with no acute findings but has meningioma.   # Near syncope  We will monitor the patient on telemetry CT head is negative for acute abnormalities MRI of the brain with no acute changes but has a right sided meningioma with no cerebral edema-could not be the etiology We'll get orthostatics Hydrated with IV fluids Get echocardiogram and carotid Dopplers PT evaluation TSH and hemoglobin A1c check  #Essential hypertension Blood pressure is elevated and patient is not taking  her home medication Norvasc will resume the same and titrate as needed   #Chronic COPD-no exacerbation at this time Albuterol as needed Continue Spiriva  #Post herpetic neuralgia Continue home medication Lidoderm and tramadol   DVT prophylaxis with Lovenox subcutaneous All the records are reviewed and case discussed with ED provider. Management plans discussed with the patient, family and they are in agreement.  CODE STATUS:FC,son  TOTAL TIME TAKING CARE OF THIS PATIENT: 45  minutes.   Note: This dictation was prepared with Dragon dictation along with smaller phrase technology. Any transcriptional errors that result from this process are unintentional.  [name] M.D on 05/31/2017 at 3:14 PM  Between 7am to 6pm - Pager - [phone]  After 6pm go to [email or web address] - password [email or web address] [facility]  [facility]  Office  [phone]  CC: Primary care physician; Madelyn Brunner, MD

## 2017-05-31 NOTE — ED Notes (Signed)
MRI speaking to patient about MRI.

## 2017-05-31 NOTE — ED Notes (Signed)
Pt ambulatory in hallway with walker, pt c/o dizziness when going from lying to sitting on the side of the bed, pt did not c/o dizziness during walk, but did c/o "feeling sick" on her stomach. Dr. Alfred Levins notified.

## 2017-05-31 NOTE — ED Notes (Signed)
Patient transported to Ultrasound 

## 2017-05-31 NOTE — ED Notes (Signed)
Patient transported to CT 

## 2017-05-31 NOTE — ED Provider Notes (Signed)
[facility] Emergency Department Provider Note  ____________________________________________  Time seen: Approximately 8:47 AM  I have reviewed the triage vital signs and the nursing notes.   HISTORY  Chief Complaint Dizziness   HPI Janice Hoffman is a 83 y.o. female with a history of COPD, pernicious anemia, hypertension who presents for evaluation of vertigo. Patient reports that she has been having episodes of room spinning in the morning for the last week. According to her daughter-in-law who is at the bedside, patient has been having episodes of dizziness for the last 2 weeks. The episodes were constant throughout the day however since patient's been started on meclizine by her PCP the episodes have gotten much better. When patient is living with her son and takes the medications 3 times a day she does well. However when she goes home she avoids taking the medication and feels worse. Patient is slightly confused at this time and does not remember having episodes of vertigo for two weeks just this am. This is her baseline per daughter-in-law. She denies lightheadedness or near syncopal episodes, she denies headache, chest pain, shortness of breath, fever, chills, nausea, vomiting, diarrhea, dysuria. She denies any trouble finding words, unilateral weakness or numbness, dysphasia, dysarthria, diplopia.  Past Medical History:  Diagnosis Date  . Asthma   . COPD (chronic obstructive pulmonary disease) ([place])   . Herpes zoster   . Hypertension   . Neuralgia     Patient Active Problem List   Diagnosis Date Noted  . COPD with acute exacerbation ([place]) 01/05/2017  . Essential hypertension, malignant 01/05/2017  . Dysphagia 01/05/2017  . COPD exacerbation ([place]) 03/15/2016  . 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 ELEVATED BLOOD PRESSURE 05/12/2008  . HERPES ZOSTER 04/26/2008  . B12 DEFICIENCY 04/26/2008  . CONSTIPATION 04/26/2008  . LEG EDEMA, BILATERAL  04/26/2008    Past Surgical History:  Procedure Laterality Date  . LEG SURGERY      Prior to Admission medications   Medication Sig Start Date End Date Taking? Authorizing Provider  acetaminophen (TYLENOL) 325 MG tablet Take 650 mg by mouth every 6 (six) hours as needed.   Yes [provider]  albuterol (PROVENTIL HFA;VENTOLIN HFA) 108 (90 Base) MCG/ACT inhaler Inhale 2 puffs into the lungs every 6 (six) hours as needed for wheezing or shortness of breath.   Yes [provider]  albuterol (PROVENTIL) (5 MG/ML) 0.5% nebulizer solution Take 2.5 mg by nebulization every 6 (six) hours as needed for wheezing or shortness of breath.   Yes [provider]  doxycycline (VIBRA-TABS) 100 MG tablet Take 100 mg by mouth 2 (two) times daily. 05/29/17 06/08/17 Yes [provider]  Fluticasone-Salmeterol (ADVAIR) 500-50 MCG/DOSE AEPB Inhale 1 puff into the lungs 2 (two) times daily.   Yes [provider]  levalbuterol (XOPENEX) 0.63 MG/3ML nebulizer solution Take 0.63 mg by nebulization every 4 (four) hours as needed for wheezing or shortness of breath.   Yes [provider]  meclizine (ANTIVERT) 12.5 MG tablet Take 12.5 mg by mouth 3 (three) times daily as needed for dizziness.   Yes [provider]  senna (SENOKOT) 8.6 MG TABS tablet Take 1 tablet by mouth daily as needed for mild constipation.   Yes [provider]  tiotropium (SPIRIVA) 18 MCG inhalation capsule Place 1 capsule (18 mcg total) into inhaler and inhale daily. 01/07/17  Yes Max Sane, MD  traMADol (ULTRAM) 50 MG tablet Take 50 mg by mouth every 6 (six) hours as  needed. Reported on 03/14/2016   Yes [provider]  amLODipine (NORVASC) 10 MG tablet Take 1 tablet (10 mg total) by mouth daily. Patient not taking: Reported on 01/05/2017 03/16/16   [name], MD  feeding supplement, ENSURE ENLIVE, (ENSURE ENLIVE) LIQD Take 237 mLs by mouth 2 (two) times daily between meals.  03/18/16   [name], MD  furosemide (LASIX) 20 MG tablet Take 1 tablet (20 mg total) by mouth daily. Patient not taking: Reported on 01/05/2017 03/18/16   [name], MD  guaiFENesin (MUCINEX) 600 MG 12 hr tablet Take 1 tablet (600 mg total) by mouth 2 (two) times daily. Patient not taking: Reported on 05/31/2017 01/06/17   Max Sane, MD  lidocaine (LIDODERM) 5 % Place 1 patch onto the skin daily. Remove & Discard patch within 12 hours or as directed by MD Patient not taking: Reported on 01/05/2017 03/16/16   [name], MD  lisinopril (PRINIVIL,ZESTRIL) 5 MG tablet Take 1 tablet (5 mg total) by mouth daily. Patient not taking: Reported on 05/31/2017 01/07/17   Max Sane, MD  Menthol-Methyl Salicylate (MUSCLE RUB) 10-15 % CREA Apply 1 application topically as needed for muscle pain. Patient not taking: Reported on 01/05/2017 03/18/16   [name], MD  predniSONE (DELTASONE) 10 MG tablet Take 1 tablet (10 mg total) by mouth daily with breakfast. 60 mg PO (ORAL) x 2 days 50 mg PO (ORAL)  x 2 days 40 mg PO (ORAL)  x 2 days 30 mg PO  (ORAL)  x 2 days 20 mg PO  (ORAL) x 2 days 10 mg PO  (ORAL) x 2 days then stop Patient not taking: Reported on 05/31/2017 01/06/17   Max Sane, MD    Allergies Nsaids; Penicillins; and Promethazine hcl  Family History  Problem Relation Age of Onset  . COPD Sister     Social History Social History  Substance Use Topics  . Smoking status: Never Smoker  . Smokeless tobacco: Never Used  . Alcohol use No    Review of Systems  Constitutional: Negative for fever. Eyes: Negative for visual changes. ENT: Negative for sore throat. Neck: No neck pain  Cardiovascular: Negative for chest pain. Respiratory: Negative for shortness of breath. Gastrointestinal: Negative for abdominal pain, vomiting or diarrhea. Genitourinary: Negative for dysuria. Musculoskeletal: Negative for back pain. Skin: Negative for rash. Neurological: Negative for headaches, weakness or  numbness. + vertigo Psych: No SI or HI  ____________________________________________   PHYSICAL EXAM:  VITAL SIGNS: ED Triage Vitals  Enc Vitals Group     BP 05/31/17 0817 (!) 187/91     Pulse Rate 05/31/17 0817 82     Resp 05/31/17 0817 18     Temp 05/31/17 0817 98.3 F (36.8 C)     Temp Source 05/31/17 0817 Oral     SpO2 05/31/17 0817 97 %     Weight 05/31/17 0818 120 lb 4.8 oz (54.6 kg)     Height 05/31/17 0818 5\' 4"  (1.626 m)     Head Circumference --      Peak Flow --      Pain Score 05/31/17 0818 0     Pain Loc --      Pain Edu? --      Excl. in [place]? --     Constitutional: Alert and oriented x 2. Well appearing and in no apparent distress. HEENT:      Head: Normocephalic and atraumatic.         Eyes: Conjunctivae are normal.  Sclera is non-icteric.       Mouth/Throat: Mucous membranes are moist.       Neck: Supple with no signs of meningismus. No bruits Cardiovascular: Regular rate and rhythm. No murmurs, gallops, or rubs. 2+ symmetrical distal pulses are present in all extremities. No JVD. Respiratory: Normal respiratory effort. Lungs are clear to auscultation bilaterally with faint expiratory wheezes.  Gastrointestinal: Soft, non tender, and non distended with positive bowel sounds. No rebound or guarding. Musculoskeletal: Nontender with normal range of motion in all extremities. No edema, cyanosis, or erythema of extremities. Neurologic: Normal speech and language. A & O x2, PERRL, unidirectional fatigable horizontal nystagmus at bilateral end gaze, CN II-XII intact, motor testing reveals good tone and bulk throughout. There is no evidence of pronator drift or dysmetria. Muscle strength is 5/5 throughout. Sensory examination is intact. Gait deferred Skin: Skin is warm, dry and intact. No rash noted. Psychiatric: Mood and affect are normal. Speech and behavior are normal.  ____________________________________________   LABS (all labs ordered are listed, but only  abnormal results are displayed)  Labs Reviewed  BASIC METABOLIC PANEL - Abnormal; Notable for the following:       Result Value   Chloride 98 (*)    All other components within normal limits  URINALYSIS, COMPLETE (UACMP) WITH MICROSCOPIC - Abnormal; Notable for the following:    Color, Urine STRAW (*)    APPearance CLEAR (*)    Ketones, ur 5 (*)    All other components within normal limits  CBC  TROPONIN I  CBG MONITORING, ED   ____________________________________________  EKG  ED ECG REPORT I, Rudene Re, the attending physician, personally viewed and interpreted this ECG.  8:18 - normal sinus rhythm, rate of 74, left bundle branch block, EKG was poor quality, no ST elevation seen. Repeat has been ordered.  9:10 - Normal sinus rhythm, rate of 74, left bundle branch block, normal QTC, normal axis, occasional PVCs, no ST elevations or depressions. Unchanged from prior ____________________________________________  RADIOLOGY  Head CT;  1. No acute intracranial abnormality. No parenchymal mass, hemorrhage or edema. Incidental note made of a small meningioma at the right frontal lobe vertex. 2. Atrophy and chronic small vessel ischemic changes in the white matter. 3. Diffuse paranasal sinus disease, most likely chronic.  MRI: 1. No acute intracranial abnormality. 2. A 14 mm right parasagittal meningioma with no associated cerebral edema is likely clinically silent. 3. Moderately advanced chronic small vessel disease. 4. Pansinus paranasal sinus inflammation. 5. Advanced cervical spine degeneration with spinal stenosis. ____________________________________________   PROCEDURES  Procedure(s) performed: None Procedures Critical Care performed:  None ____________________________________________   INITIAL IMPRESSION / ASSESSMENT AND PLAN / ED COURSE  83 y.o. female with a history of COPD, pernicious anemia, hypertension who presents for evaluation of vertigo x 2  weeks improved with meclizine. No prior history of vertigo or strokes. Patient is neurologically intact and asymptomatic at this time. Her vitals show hypertension but no other acute findings. According to daughter-in-law patient was told one time she had elevated blood pressure during a PCPs visit and was placed on lisinopril which made her very confused and hypotensive. She does not take any medications for high blood pressure. Patient denies headache. We'll do a head CT, check basic blood work, give her a dose of meclizine, and monitor closely.  Clinical Course as of Jun 01 1443  Sat May 31, 2017  1131 CT, labs, and urine with no acute findings. Attempted to ambulate patient after  meclizine however she felt vertiginous and had nausea. We'll give IV fluids, send patient for an MRI.  [CV]    Clinical Course User Index [CV] Alfred Levins, [place], MD   _________________________ 2:45 PM on 05/31/2017 -----------------------------------------  MRI negative for any evidence of acute or subacute stroke. After fluids and meclizine patient remains with inability to walk due to vertigo. Discussed with the hospitalist for admission.  Pertinent labs & imaging results that were available during my care of the patient were reviewed by me and considered in my medical decision making (see chart for details).    ____________________________________________   FINAL CLINICAL IMPRESSION(S) / ED DIAGNOSES  Final diagnoses:  Vertigo      NEW MEDICATIONS STARTED DURING THIS VISIT:  New Prescriptions   No medications on file     Note:  This document was prepared using Dragon voice recognition software and may include unintentional dictation errors.    Alfred Levins, [place], MD 05/31/17 [id number]

## 2017-05-31 NOTE — ED Notes (Signed)
Pt transported to MRI at 1310, pt assisted to bathroom in MRI, pt states she is short of breath, pt has audible wheezes but stated earlier she always wheezes. Pt states she needs her inhaler.  Pt able to speak in complete sentences without running out of breath.  Pt states she is not going to do the MRI, states she does not need it done and is worried about what it is going to do to her brain.  Pt rolled into MRI machine and refused from staff. I notified Dr. Alfred Levins at [phone] and received an order to give Ativan 0.5mg  IV and give her 2 puffs of her inhaler.  Pt given medications in MRI as ordered by MD, pt still refusing, but again discussed with patient the effects of the MRI.  Pt did agree to do the MRI and was told by this RN and MRI techs that if she felt uncomfortable or needed to stop to notify them. Pt agreed.

## 2017-06-01 ENCOUNTER — Observation Stay
Admit: 2017-06-01 | Discharge: 2017-06-01 | Disposition: A | Discharge status: Home / Self Care | Payer: Medicare Other | Attending: Internal Medicine | Admitting: Internal Medicine

## 2017-06-01 LAB — GLUCOSE, CAPILLARY
GLUCOSE-CAPILLARY: 84 mg/dL (ref 65–99)
Glucose-Capillary: 102 mg/dL — ABNORMAL HIGH (ref 65–99)

## 2017-06-01 LAB — ECHOCARDIOGRAM COMPLETE
Height: 64 in
Weight: 1894.4 oz

## 2017-06-01 LAB — TSH: TSH: 0.617 u[IU]/mL (ref 0.350–4.500)

## 2017-06-01 LAB — HEMOGLOBIN A1C
Hgb A1c MFr Bld: 5.1 % (ref 4.8–5.6)
MEAN PLASMA GLUCOSE: 99.67 mg/dL

## 2017-06-01 LAB — TROPONIN I: Troponin I: <0.03 ng/mL (ref <0.03)

## 2017-06-01 MED ORDER — AMLODIPINE BESYLATE 10 MG PO TABS: 10.0000 mg | ORAL_TABLET | Freq: Every day | ORAL | 0 refills | Status: DC

## 2017-06-01 MED ORDER — SODIUM CHLORIDE 0.9% FLUSH: 3.0000 mL | INTRAVENOUS | Status: DC | PRN

## 2017-06-01 NOTE — Progress Notes (Signed)
Patient D/C home. HH setup by care management. D/C instructions reviewed with patient and her son. Son is POA. Both verbalized understanding of new meds and meds to stop. Patient responsible for scheduling F/U appts. IV and telemetry previously removed.

## 2017-06-01 NOTE — Discharge Summary (Signed)
[facility] NAME: Janice Hoffman    MR#:  [id number]  DATE OF BIRTH:  12/29/1925  DATE OF ADMISSION:  05/31/2017 ADMITTING PHYSICIAN: [name], MD  DATE OF DISCHARGE: 06/01/2017  PRIMARY CARE PHYSICIAN: Madelyn Brunner, MD    ADMISSION DIAGNOSIS:  Vertigo [R42] Near syncope [R55]  DISCHARGE DIAGNOSIS:  Active Problems:   Near syncope   SECONDARY DIAGNOSIS:   Past Medical History:  Diagnosis Date  . Asthma   . COPD (chronic obstructive pulmonary disease) ([place])   . Herpes zoster   . Hypertension   . Neuralgia     HOSPITAL COURSE:   83 year old female with a history of COPD and essential hypertension who presents with dizziness.   1. Dizziness: Patient had CT scan and MRI of the brain which were essentially normal. MRI did show meningioma which is an incidental finding. MRI also did show some cervical stenosis. I am wondering if this is the etiology of her dizziness. She will need outpatient follow-up with possible neurosurgery if this becomes a greater issue. She may resume meclizine.  2. 14 mm right parasagittal meningioma with no associated cerebral edema is likely clinically silent   3. Essential hypertension: Patient is not taking blood pressure medications at home however her blood pressure is elevated here. She should be discharged on Norvasc. She needs close follow-up with her blood pressure.   4. COPD without acute exacerbation: Patient will continue on inhalers when necessary DISCHARGE CONDITIONS AND DIET:   Stable for discharge on heart healthy diet  CONSULTS OBTAINED:    DRUG ALLERGIES:   Allergies  Allergen Reactions  . Nsaids Shortness Of Breath    Throat swelling, shortness of breath  . Penicillins     Has patient had a PCN reaction causing immediate rash, facial/tongue/throat swelling, SOB or lightheadedness with hypotension: Unknown Has patient had a PCN reaction causing severe rash  involving mucus membranes or skin necrosis: Unknown Has patient had a PCN reaction that required hospitalization: Unknown Has patient had a PCN reaction occurring within the last 10 years: Unknown If all of the above answers are "NO", then may proceed with Cephalosporin use.   . Promethazine Hcl     DISCHARGE MEDICATIONS:   Current Discharge Medication List    CONTINUE these medications which have CHANGED   Details  amLODipine (NORVASC) 10 MG tablet Take 1 tablet (10 mg total) by mouth daily. Qty: 30 tablet, Refills: 0      CONTINUE these medications which have NOT CHANGED   Details  acetaminophen (TYLENOL) 325 MG tablet Take 650 mg by mouth every 6 (six) hours as needed.    albuterol (PROVENTIL HFA;VENTOLIN HFA) 108 (90 Base) MCG/ACT inhaler Inhale 2 puffs into the lungs every 6 (six) hours as needed for wheezing or shortness of breath.    albuterol (PROVENTIL) (5 MG/ML) 0.5% nebulizer solution Take 2.5 mg by nebulization every 6 (six) hours as needed for wheezing or shortness of breath.    doxycycline (VIBRA-TABS) 100 MG tablet Take 100 mg by mouth 2 (two) times daily.    Fluticasone-Salmeterol (ADVAIR) 500-50 MCG/DOSE AEPB Inhale 1 puff into the lungs 2 (two) times daily.    meclizine (ANTIVERT) 12.5 MG tablet Take 12.5 mg by mouth 3 (three) times daily as needed for dizziness.    senna (SENOKOT) 8.6 MG TABS tablet Take 1 tablet by mouth daily as needed for mild constipation.    tiotropium (SPIRIVA) 18 MCG  inhalation capsule Place 1 capsule (18 mcg total) into inhaler and inhale daily. Qty: 30 capsule, Refills: 0    traMADol (ULTRAM) 50 MG tablet Take 50 mg by mouth every 6 (six) hours as needed. Reported on 03/14/2016    feeding supplement, ENSURE ENLIVE, (ENSURE ENLIVE) LIQD Take 237 mLs by mouth 2 (two) times daily between meals. Qty: 237 mL, Refills: 12      STOP taking these medications     levalbuterol (XOPENEX) 0.63 MG/3ML nebulizer solution      furosemide  (LASIX) 20 MG tablet      guaiFENesin (MUCINEX) 600 MG 12 hr tablet      lidocaine (LIDODERM) 5 %      lisinopril (PRINIVIL,ZESTRIL) 5 MG tablet      Menthol-Methyl Salicylate (MUSCLE RUB) 10-15 % CREA      predniSONE (DELTASONE) 10 MG tablet           Today   CHIEF COMPLAINT:  Nursing is reporting some dizziness upon ambulation. Patient reports no dizziness, chest pain or shortness of breath.   VITAL SIGNS:  Blood pressure (!) 163/71, pulse 76, temperature 98.5 F (36.9 C), temperature source Oral, resp. rate 18, height 5\' 4"  (1.626 m), weight 53.7 kg (118 lb 6.4 oz), SpO2 95 %.   REVIEW OF SYSTEMS:  Review of Systems  Constitutional: Negative.  Negative for chills, fever and malaise/fatigue.  HENT: Negative.  Negative for ear discharge, ear pain, hearing loss, nosebleeds and sore throat.   Eyes: Negative.  Negative for blurred vision and pain.  Respiratory: Negative.  Negative for cough, hemoptysis, shortness of breath and wheezing.   Cardiovascular: Negative.  Negative for chest pain, palpitations and leg swelling.  Gastrointestinal: Negative.  Negative for abdominal pain, blood in stool, diarrhea, nausea and vomiting.  Genitourinary: Negative.  Negative for dysuria.  Musculoskeletal: Negative.  Negative for back pain.  Skin: Negative.   Neurological: Negative for dizziness, tremors, speech change, focal weakness, seizures and headaches.  Endo/Heme/Allergies: Negative.  Does not bruise/bleed easily.  Psychiatric/Behavioral: Negative.  Negative for depression, hallucinations and suicidal ideas.     PHYSICAL EXAMINATION:  GENERAL:  83 y.o.-year-old patient lying in the bed with no acute distress.  Issues as no nystagmus on examination NECK:  Supple, no jugular venous distention. No thyroid enlargement, no tenderness.  LUNGS: Normal breath sounds bilaterally, no wheezing, rales,rhonchi  No use of accessory muscles of respiration.  CARDIOVASCULAR: S1, S2 normal. No  murmurs, rubs, or gallops.  ABDOMEN: Soft, non-tender, non-distended. Bowel sounds present. No organomegaly or mass.  EXTREMITIES: No pedal edema, cyanosis, or clubbing.  PSYCHIATRIC: The patient is alert and oriented x name and placed on time  SKIN: No obvious rash, lesion, or ulcer.   DATA REVIEW:   CBC  Recent Labs Lab 05/31/17 0820  WBC 6.4  HGB 13.9  HCT 40.5  PLT 193    Chemistries   Recent Labs Lab 05/31/17 0820  NA 135  K 3.6  CL 98*  CO2 29  GLUCOSE 98  BUN 13  CREATININE 0.51  CALCIUM 9.5    Cardiac Enzymes  Recent Labs Lab 05/31/17 1707 05/31/17 2249 06/01/17 0417  TROPONINI <0.03 <0.03 <0.03    Microbiology Results  @[email or web address] @  RADIOLOGY:  Dg Chest 2 View  Result Date: 05/31/2017 CLINICAL DATA:  Increasing dizziness and wheezing EXAM: CHEST  2 VIEW COMPARISON:  01/05/2017 FINDINGS: Cardiac shadow is stable. Aortic calcifications are again seen. Lungs are hyperaerated bilaterally consistent with COPD. No focal infiltrate or  sizable effusion is seen. Chronic bronchitic changes are again noted. No acute bony abnormality is seen. IMPRESSION: No acute abnormality noted. Aortic Atherosclerosis (ICD10-I70.0) and Emphysema (ICD10-J43.9). Electronically Signed   By: Inez Catalina M.D.   On: 05/31/2017 09:57   Ct Head Wo Contrast  Result Date: 05/31/2017 CLINICAL DATA:  Vertigo, increasing dizziness over the past several weeks. EXAM: CT HEAD WITHOUT CONTRAST TECHNIQUE: Contiguous axial images were obtained from the base of the skull through the vertex without intravenous contrast. COMPARISON:  Head CT dated 11/10/2014. FINDINGS: Brain: Generalized age related parenchymal atrophy with commensurate dilatation of the ventricles and sulci. Confluent chronic small vessel ischemic changes within the bilateral periventricular and subcortical white matter regions. No mass, hemorrhage, edema or other evidence of acute parenchymal abnormality. No extra-axial  hemorrhage. 12 mm meningioma noted at the right frontal lobe vertex. Vascular: There are chronic calcified atherosclerotic changes of the large vessels at the skull base. No unexpected hyperdense vessel. Skull: Normal. Negative for fracture or focal lesion. Sinuses/Orbits: Chronic mucosal thickening throughout the ethmoid air cells. Chronic appearing mucosal thickening within the sphenoid sinuses, but increased compared to earlier studies. Mild mucosal thickening within the upper maxillary sinuses, incompletely imaged, chronic appearing. Periorbital and retro-orbital soft tissues are unremarkable. Other: Mastoid air cells are clear. External auditory canals and middle ear cavities appear clear bilaterally. IMPRESSION: 1. No acute intracranial abnormality. No parenchymal mass, hemorrhage or edema. Incidental note made of a small meningioma at the right frontal lobe vertex. 2. Atrophy and chronic small vessel ischemic changes in the white matter. 3. Diffuse paranasal sinus disease, most likely chronic. Electronically Signed   By: Franki Cabot M.D.   On: 05/31/2017 09:38   Mr Brain Wo Contrast  Result Date: 05/31/2017 CLINICAL DATA:  83 year old female with increased episodic dizziness over the past several weeks. Currently on doxycycline for a draining head wound. EXAM: MRI HEAD WITHOUT CONTRAST TECHNIQUE: Multiplanar, multiecho pulse sequences of the brain and surrounding structures were obtained without intravenous contrast. COMPARISON:  Head CT without contrast 0926 hours today, and earlier. FINDINGS: Brain: No restricted diffusion or evidence of acute infarction. Patchy and confluent bilateral cerebral white matter and deep gray matter nuclei T2 and FLAIR hyperintensity. Small chronic lacunar infarcts in both cerebellar hemispheres and the pons. Chronic microhemorrhage in the left thalamus. No cortical encephalomalacia identified. Small 12 x 14 mm round extra-axial mass at the right vertex abutting the  interhemispheric fissure (series 8, image 45 and series 11, image 11, corresponding to the hyperdense lesion seen by CT today and consistent with a small meningioma. Mild mass effect on the adjacent right superior frontal gyrus, but no frontal lobe edema. No other intracranial mass effect or mass lesion. No ventriculomegaly, extra-axial fluid collection or acute intracranial hemorrhage. Cervicomedullary junction and pituitary are within normal limits. Vascular: Major intracranial vascular flow voids are preserved. Skull and upper cervical spine: Advanced multilevel cervical spine degeneration with partially visible spinal stenosis (series 2, image 12). Sinuses/Orbits: Postoperative changes to both globes, other orbits soft tissues are within normal limits. Diffuse paranasal sinus mucosal thickening. Other: Visible internal auditory structures appear normal. Mastoid air cells are clear. No scalp soft tissue wound is identified. IMPRESSION: 1.  No acute intracranial abnormality. 2. A 14 mm right parasagittal meningioma with no associated cerebral edema is likely clinically silent. 3. Moderately advanced chronic small vessel disease. 4. Pansinus paranasal sinus inflammation. 5. Advanced cervical spine degeneration with spinal stenosis. Electronically Signed   By: Herminio Heads.D.  On: 05/31/2017 14:28   US Carotid Bilateral  Result Date: 05/31/2017 CLINICAL DATA:  Near syncope. EXAM: BILATERAL CAROTID DUPLEX ULTRASOUND TECHNIQUE: Pearline Cables scale imaging, color Doppler and duplex ultrasound were performed of bilateral carotid and vertebral arteries in the neck. COMPARISON:  MRI brain 05/31/2017 FINDINGS: Criteria: Quantification of carotid stenosis is based on velocity parameters that correlate the residual internal carotid diameter with NASCET-based stenosis levels, using the diameter of the distal internal carotid lumen as the denominator for stenosis measurement. The following velocity measurements were obtained: RIGHT  ICA:  114 cm/sec CCA:  650 cm/sec SYSTOLIC ICA/CCA RATIO:  1.0 DIASTOLIC ICA/CCA RATIO:  3.0 ECA:  146 cm/sec LEFT ICA:  106 cm/sec CCA:  91 Cm/sec SYSTOLIC ICA/CCA RATIO:  1.2 DIASTOLIC ICA/CCA RATIO:  1.9 ECA:  106 cm/sec RIGHT CAROTID ARTERY: Minimal atherosclerotic plaque over the common carotid artery. Mild calcified plaque at the carotid bifurcation. Mild calcified plaque at the origin of the right internal carotid artery. Normal Doppler waveforms. RIGHT VERTEBRAL ARTERY:  Normal antegrade flow. LEFT CAROTID ARTERY: Mild calcified plaque throughout the common carotid artery. Mild calcified plaque at the carotid bulb. Mild calcified plaque at the origin of the internal carotid artery. Mild spectral broadening of the distal internal carotid artery waveform as the Doppler waveforms are otherwise unremarkable. LEFT VERTEBRAL ARTERY:  Normal antegrade flow. IMPRESSION: Mild atherosclerotic disease involving the common carotid arteries and origin of the internal carotid arteries bilaterally right worse than left. No hemodynamically significant stenosis. Normal antegrade flow within the vertebral arteries. Electronically Signed   By: Marin Olp M.D.   On: 05/31/2017 16:16      Current Discharge Medication List    CONTINUE these medications which have CHANGED   Details  amLODipine (NORVASC) 10 MG tablet Take 1 tablet (10 mg total) by mouth daily. Qty: 30 tablet, Refills: 0      CONTINUE these medications which have NOT CHANGED   Details  acetaminophen (TYLENOL) 325 MG tablet Take 650 mg by mouth every 6 (six) hours as needed.    albuterol (PROVENTIL HFA;VENTOLIN HFA) 108 (90 Base) MCG/ACT inhaler Inhale 2 puffs into the lungs every 6 (six) hours as needed for wheezing or shortness of breath.    albuterol (PROVENTIL) (5 MG/ML) 0.5% nebulizer solution Take 2.5 mg by nebulization every 6 (six) hours as needed for wheezing or shortness of breath.    doxycycline (VIBRA-TABS) 100 MG tablet Take 100  mg by mouth 2 (two) times daily.    Fluticasone-Salmeterol (ADVAIR) 500-50 MCG/DOSE AEPB Inhale 1 puff into the lungs 2 (two) times daily.    meclizine (ANTIVERT) 12.5 MG tablet Take 12.5 mg by mouth 3 (three) times daily as needed for dizziness.    senna (SENOKOT) 8.6 MG TABS tablet Take 1 tablet by mouth daily as needed for mild constipation.    tiotropium (SPIRIVA) 18 MCG inhalation capsule Place 1 capsule (18 mcg total) into inhaler and inhale daily. Qty: 30 capsule, Refills: 0    traMADol (ULTRAM) 50 MG tablet Take 50 mg by mouth every 6 (six) hours as needed. Reported on 03/14/2016    feeding supplement, ENSURE ENLIVE, (ENSURE ENLIVE) LIQD Take 237 mLs by mouth 2 (two) times daily between meals. Qty: 237 mL, Refills: 12      STOP taking these medications     levalbuterol (XOPENEX) 0.63 MG/3ML nebulizer solution      furosemide (LASIX) 20 MG tablet      guaiFENesin (MUCINEX) 600 MG 12 hr tablet  lidocaine (LIDODERM) 5 %      lisinopril (PRINIVIL,ZESTRIL) 5 MG tablet      Menthol-Methyl Salicylate (MUSCLE RUB) 10-15 % CREA      predniSONE (DELTASONE) 10 MG tablet           Management plans discussed with the patient and she is in agreement. Stable for discharge   Patient should follow up with pcp  CODE STATUS:     Code Status Orders        Start     Ordered   05/31/17 1626  Full code  Continuous     05/31/17 1625    Code Status History    Date Active Date Inactive Code Status Order ID Comments User Context   01/05/2017  3:55 PM 01/06/2017  4:48 PM Full Code [id number]  Theodoro Grist, MD Inpatient   03/15/2016  2:25 AM 03/18/2016  9:49 PM Full Code [id number]  Saundra Shelling, MD Inpatient    Advance Directive Documentation     Most Recent Value  Type of Advance Directive  Healthcare Power of [place], Living will  Pre-existing out of facility DNR order (yellow form or pink MOST form)  -  "MOST" Form in Place?  -      TOTAL TIME TAKING CARE OF THIS  PATIENT: 37 minutes.    Note: This dictation was prepared with Dragon dictation along with smaller phrase technology. Any transcriptional errors that result from this process are unintentional.  Ledia Hanford M.D on 06/01/2017 at 10:43 AM  Between 7am to 6pm - Pager - [phone] After 6pm go to [email or web address] - password [email or web address] [facility]  Office  [phone]  CC: Primary care physician; Madelyn Brunner, MD

## 2017-06-01 NOTE — Progress Notes (Signed)
IV and telemetry monitoring removed. MD order for patient D/C

## 2017-06-01 NOTE — Progress Notes (Signed)
[facility] [place] NAME: Janice Hoffman    MR#:  [id number]  DATE OF BIRTH:  04/19/1926  SUBJECTIVE:  Doing well this a.m. Nurse reports dizziness upon standing however patient is denying this.  REVIEW OF SYSTEMS:    Review of Systems  Constitutional: Negative for fever, chills weight loss HENT: Negative for ear pain, nosebleeds, congestion, facial swelling, rhinorrhea, neck pain, neck stiffness and ear discharge.   Respiratory: Negative for cough, shortness of breath, wheezing  Cardiovascular: Negative for chest pain, palpitations and leg swelling.  Gastrointestinal: Negative for heartburn, abdominal pain, vomiting, diarrhea or consitpation Genitourinary: Negative for dysuria, urgency, frequency, hematuria Musculoskeletal: Negative for back pain or joint pain Neurological: Negative for dizziness, seizures, syncope, focal weakness,  numbness and headaches.  Hematological: Does not bruise/bleed easily.  Psychiatric/Behavioral: Negative for hallucinations, confusion, dysphoric mood    Tolerating Diet: yes      DRUG ALLERGIES:   Allergies  Allergen Reactions  . Nsaids Shortness Of Breath    Throat swelling, shortness of breath  . Penicillins     Has patient had a PCN reaction causing immediate rash, facial/tongue/throat swelling, SOB or lightheadedness with hypotension: Unknown Has patient had a PCN reaction causing severe rash involving mucus membranes or skin necrosis: Unknown Has patient had a PCN reaction that required hospitalization: Unknown Has patient had a PCN reaction occurring within the last 10 years: Unknown If all of the above answers are "NO", then may proceed with Cephalosporin use.   . Promethazine Hcl     VITALS:  Blood pressure (!) 163/71, pulse 76, temperature 98.5 F (36.9 C), temperature source Oral, resp. rate 18, height 5\' 4"  (1.626 m), weight 53.7 kg (118 lb 6.4 oz), SpO2 95 %.  PHYSICAL EXAMINATION:   Constitutional: Appears well-developed and well-nourished. No distress. HENT: Normocephalic. Marland Kitchen Oropharynx is clear and moist.  Eyes: Conjunctivae and EOM are normal. PERRLA, no scleral icterus.  Neck: Normal ROM. Neck supple. No JVD. No tracheal deviation. CVS: RRR, S1/S2 +, no murmurs, no gallops, no carotid bruit.  Pulmonary: Effort and breath sounds normal, no stridor, rhonchi, wheezes, rales.  Abdominal: Soft. BS +,  no distension, tenderness, rebound or guarding.  Musculoskeletal: Normal range of motion. No edema and no tenderness.  Neuro: Alert. CN 2-12 grossly intact. No focal deficits. No nystagmus is noted Skin: Skin is warm and dry. No rash noted. Psychiatric: Normal mood and affect.      LABORATORY PANEL:   CBC  Recent Labs Lab 05/31/17 0820  WBC 6.4  HGB 13.9  HCT 40.5  PLT 193   ------------------------------------------------------------------------------------------------------------------  Chemistries   Recent Labs Lab 05/31/17 0820  NA 135  K 3.6  CL 98*  CO2 29  GLUCOSE 98  BUN 13  CREATININE 0.51  CALCIUM 9.5   ------------------------------------------------------------------------------------------------------------------  Cardiac Enzymes  Recent Labs Lab 05/31/17 1707 05/31/17 2249 06/01/17 0417  TROPONINI <0.03 <0.03 <0.03   ------------------------------------------------------------------------------------------------------------------  RADIOLOGY:  Dg Chest 2 View  Result Date: 05/31/2017 CLINICAL DATA:  Increasing dizziness and wheezing EXAM: CHEST  2 VIEW COMPARISON:  01/05/2017 FINDINGS: Cardiac shadow is stable. Aortic calcifications are again seen. Lungs are hyperaerated bilaterally consistent with COPD. No focal infiltrate or sizable effusion is seen. Chronic bronchitic changes are again noted. No acute bony abnormality is seen. IMPRESSION: No acute abnormality noted. Aortic Atherosclerosis (ICD10-I70.0) and Emphysema  (ICD10-J43.9). Electronically Signed   By: Inez Catalina M.D.   On: 05/31/2017 09:57   Ct Head  Wo Contrast  Result Date: 05/31/2017 CLINICAL DATA:  Vertigo, increasing dizziness over the past several weeks. EXAM: CT HEAD WITHOUT CONTRAST TECHNIQUE: Contiguous axial images were obtained from the base of the skull through the vertex without intravenous contrast. COMPARISON:  Head CT dated 11/10/2014. FINDINGS: Brain: Generalized age related parenchymal atrophy with commensurate dilatation of the ventricles and sulci. Confluent chronic small vessel ischemic changes within the bilateral periventricular and subcortical white matter regions. No mass, hemorrhage, edema or other evidence of acute parenchymal abnormality. No extra-axial hemorrhage. 12 mm meningioma noted at the right frontal lobe vertex. Vascular: There are chronic calcified atherosclerotic changes of the large vessels at the skull base. No unexpected hyperdense vessel. Skull: Normal. Negative for fracture or focal lesion. Sinuses/Orbits: Chronic mucosal thickening throughout the ethmoid air cells. Chronic appearing mucosal thickening within the sphenoid sinuses, but increased compared to earlier studies. Mild mucosal thickening within the upper maxillary sinuses, incompletely imaged, chronic appearing. Periorbital and retro-orbital soft tissues are unremarkable. Other: Mastoid air cells are clear. External auditory canals and middle ear cavities appear clear bilaterally. IMPRESSION: 1. No acute intracranial abnormality. No parenchymal mass, hemorrhage or edema. Incidental note made of a small meningioma at the right frontal lobe vertex. 2. Atrophy and chronic small vessel ischemic changes in the white matter. 3. Diffuse paranasal sinus disease, most likely chronic. Electronically Signed   By: Franki Cabot M.D.   On: 05/31/2017 09:38   Mr Brain Wo Contrast  Result Date: 05/31/2017 CLINICAL DATA:  83 year old female with increased episodic dizziness  over the past several weeks. Currently on doxycycline for a draining head wound. EXAM: MRI HEAD WITHOUT CONTRAST TECHNIQUE: Multiplanar, multiecho pulse sequences of the brain and surrounding structures were obtained without intravenous contrast. COMPARISON:  Head CT without contrast 0926 hours today, and earlier. FINDINGS: Brain: No restricted diffusion or evidence of acute infarction. Patchy and confluent bilateral cerebral white matter and deep gray matter nuclei T2 and FLAIR hyperintensity. Small chronic lacunar infarcts in both cerebellar hemispheres and the pons. Chronic microhemorrhage in the left thalamus. No cortical encephalomalacia identified. Small 12 x 14 mm round extra-axial mass at the right vertex abutting the interhemispheric fissure (series 8, image 45 and series 11, image 11, corresponding to the hyperdense lesion seen by CT today and consistent with a small meningioma. Mild mass effect on the adjacent right superior frontal gyrus, but no frontal lobe edema. No other intracranial mass effect or mass lesion. No ventriculomegaly, extra-axial fluid collection or acute intracranial hemorrhage. Cervicomedullary junction and pituitary are within normal limits. Vascular: Major intracranial vascular flow voids are preserved. Skull and upper cervical spine: Advanced multilevel cervical spine degeneration with partially visible spinal stenosis (series 2, image 12). Sinuses/Orbits: Postoperative changes to both globes, other orbits soft tissues are within normal limits. Diffuse paranasal sinus mucosal thickening. Other: Visible internal auditory structures appear normal. Mastoid air cells are clear. No scalp soft tissue wound is identified. IMPRESSION: 1.  No acute intracranial abnormality. 2. A 14 mm right parasagittal meningioma with no associated cerebral edema is likely clinically silent. 3. Moderately advanced chronic small vessel disease. 4. Pansinus paranasal sinus inflammation. 5. Advanced cervical  spine degeneration with spinal stenosis. Electronically Signed   By: Genevie Ann M.D.   On: 05/31/2017 14:28   US Carotid Bilateral  Result Date: 05/31/2017 CLINICAL DATA:  Near syncope. EXAM: BILATERAL CAROTID DUPLEX ULTRASOUND TECHNIQUE: Pearline Cables scale imaging, color Doppler and duplex ultrasound were performed of bilateral carotid and vertebral arteries in the neck. COMPARISON:  MRI brain 05/31/2017 FINDINGS: Criteria: Quantification of carotid stenosis is based on velocity parameters that correlate the residual internal carotid diameter with NASCET-based stenosis levels, using the diameter of the distal internal carotid lumen as the denominator for stenosis measurement. The following velocity measurements were obtained: RIGHT ICA:  114 cm/sec CCA:  381 cm/sec SYSTOLIC ICA/CCA RATIO:  1.0 DIASTOLIC ICA/CCA RATIO:  3.0 ECA:  146 cm/sec LEFT ICA:  106 cm/sec CCA:  91 Cm/sec SYSTOLIC ICA/CCA RATIO:  1.2 DIASTOLIC ICA/CCA RATIO:  1.9 ECA:  106 cm/sec RIGHT CAROTID ARTERY: Minimal atherosclerotic plaque over the common carotid artery. Mild calcified plaque at the carotid bifurcation. Mild calcified plaque at the origin of the right internal carotid artery. Normal Doppler waveforms. RIGHT VERTEBRAL ARTERY:  Normal antegrade flow. LEFT CAROTID ARTERY: Mild calcified plaque throughout the common carotid artery. Mild calcified plaque at the carotid bulb. Mild calcified plaque at the origin of the internal carotid artery. Mild spectral broadening of the distal internal carotid artery waveform as the Doppler waveforms are otherwise unremarkable. LEFT VERTEBRAL ARTERY:  Normal antegrade flow. IMPRESSION: Mild atherosclerotic disease involving the common carotid arteries and origin of the internal carotid arteries bilaterally right worse than left. No hemodynamically significant stenosis. Normal antegrade flow within the vertebral arteries. Electronically Signed   By: Marin Olp M.D.   On: 05/31/2017 16:16     ASSESSMENT  AND PLAN:   83 year old female with a history of COPD and essential hypertension who presents with dizziness.   1. Dizziness: Patient had CT scan and MRI of the brain which were essentially normal. MRI did show meningioma which is an incidental finding. MRI also did show some cervical stenosis. I am wondering if this is the etiology of her dizziness. She will need outpatient follow-up with possible neurosurgery if this becomes a greater issue. She may resume meclizine. Carotid Doppler shows no hemodynamically significant stenosis. Preliminary echocardiogram shows no major valvular abnormalities.   2. 14 mm right parasagittal meningioma with no associated cerebral edema is likely clinically silent   3. Essential hypertension: Patient is not taking blood pressure medications at home however her blood pressure is elevated here. She should be discharged on Norvasc. She needs close follow-up with her blood pressure.   4. COPD without acute exacerbation: Patient will continue on inhalers when necessary      Management plans discussed with the patient and she is in agreement.  CODE STATUS: FULL  TOTAL TIME TAKING CARE OF THIS PATIENT: 30 minutes.     POSSIBLE D/C today, DEPENDING ON Physical therapy consultation  Romell Wolden M.D on 06/01/2017 at 10:47 AM  Between 7am to 6pm - Pager - [phone] After 6pm go to [email or web address] - password [email or web address] [facility]  Office  [phone]  CC: Primary care physician; Madelyn Brunner, MD  Note: This dictation was prepared with Dragon dictation along with smaller phrase technology. Any transcriptional errors that result from this process are unintentional.

## 2017-06-01 NOTE — Care Management Note (Addendum)
Case Management Note  Patient Details  Name: Janice Hoffman MRN: [id number] Date of Birth: 1926-05-29  Subjective/Objective:       Discussed discharge planning with [name] son, Dr Grant Fontana. Dr Rock Nephew chose [facility] to be [name] home health provider.Dr Grant Fontana requested that [facility] arrange appointments and coordinate care through him at cell: [phone].  A referral for HH=PT, RN was called to [facility] [place]. Verdis Frederickson called back to decline this referral per note nurses available. A referral for HH=PT, RN was then called to [facility] who accepted this referral. A message was left on Dr Astrid Drafts cell with information regarding change in [place] provider from [facility] to [place].             Action/Plan:   Expected Discharge Date:  06/01/17               Expected Discharge Plan:  [place]  In-House Referral:  NA  Discharge planning Services  CM Consult  Post Acute Care Choice:  NA Choice offered to:  Adult Children (Son Dr Grant Fontana)  DME Arranged:  N/A DME Agency:  NA  HH Arranged:  PT, RN [place] Agency:  [facility]  Status of Service:  Completed, signed off  If discussed at [employer], dates discussed:    Additional Comments:  Caran Storck A, RN 06/01/2017, 11:47 AM

## 2017-06-01 NOTE — Evaluation (Signed)
Physical Therapy Evaluation Patient Details Name: Janice Hoffman MRN: [id number] DOB: [birth year] Today's Date: 06/01/2017   History of Present Illness  Pt is an 83 y.o. female presenting to hospital with vertigo x2 weeks (improved at home with meclizine).  MRI showing 14 mm R parasagittal meningioma with no associated cerebral edema likely clinically silent.  PMH includes COPD, htn, herpes zoster, neuralgia, L LE surgery.  Pt's son reports pt had L LE injury/fx (s/p surgery) and has had chronic L LE peroneal nerve pain since (varies in pain levels with mobility).  Clinical Impression  Prior to hospital admission, pt was "furniture walking" within the home and uses either [facility] or RW in the community.  Pt lives alone but has stayed with her son the past 2 weeks.  Currently pt is modified independent with bed mobility, SBA with transfers, and CGA with ambulation 200 feet.  Pt alternating between no AD vs RW depending on how she was feeling during session (pt initially declining to use any AD but agreeable to using RW intermittently during ambulation d/t chronic L LE pain).  Pt appearing cautious and decreased cadence ambulating without AD (although no loss of balance noted); improved confidence and increased gait speed noted with use of RW.  Anticipate pt will return to "furniture walking" upon discharge (requires UE support for balance and "furniture walking" is pt's preferred method).  Pt would benefit from skilled PT to address noted impairments and functional limitations (see below for any additional details).  Recommend pt discharge home with intermittent supervision/assistance as needed (pt's son encouraging pt to discharge to his home where she had extra support and pt verbalizing concerns regarding burden on him and his wife (pt self reports being very "independent"); PT educated pt on benefits of initial discharge to son's home after hospitalization with eventual transition to home; pt verbalizing  understanding).  Discussed physical therapy after discharge and pt's son interested in therapy in OP setting and possibly aquatics:  PT educated pt and pt's son to follow up with PCP regarding OP PT needs.  PT recommendations discussed with nursing and Dr. Benjie Karvonen.    Follow Up Recommendations  (Intermittent supervision/assistance as needed; follow up with PCP for possible OP PT needs)    Equipment Recommendations  Rolling walker with 5" wheels (pt already has RW at home)    Recommendations for Other Services       Precautions / Restrictions Precautions Precautions: Fall Restrictions Weight Bearing Restrictions: No      Mobility  Bed Mobility Overal bed mobility: Modified Independent             General bed mobility comments: Supine to/from sit with HOB elevated without any difficulties.  Transfers Overall transfer level: Needs assistance Equipment used: None;Rolling walker (2 wheeled) Transfers: Sit to/from Stand Sit to Stand: Supervision         General transfer comment: pt steady standing/sitting with minimal UE support  Ambulation/Gait Ambulation/Gait assistance: Min guard Ambulation Distance (Feet): 200 Feet Assistive device: None;Rolling walker (2 wheeled)   Gait velocity: decreased   General Gait Details: decreased stance time L LE (d/t chronic L LE pain) but improved with use of RW; pt started out with no AD first 40 feet, switched to RW d/t L LE pain, and then pt requested to switch back to no AD for another 40 feet, and then returned to using RW rest of ambulation.  Pt with more cautions/slower gait with no AD (no loss of balance noted); improved cadence and  confidence noted with use of RW (steady without loss of balance).  Intermittent mild SOB noted requiring brief standing rest break (O2 sats 90-92% on room air during session); pt and pt's son reports this is baseline.  Stairs            Wheelchair Mobility    Modified Rankin (Stroke Patients  Only)       Balance Overall balance assessment: Needs assistance Sitting-balance support: No upper extremity supported;Feet supported Sitting balance-Leahy Scale: Good Sitting balance - Comments: sitting reaching within BOS   Standing balance support: No upper extremity supported;During functional activity Standing balance-Leahy Scale: Good Standing balance comment: pt able to ambulate with no UE support but more cautions with step length (CGA)                             Pertinent Vitals/Pain Pain Assessment: 0-10 Pain Score: 6  (6/10 with mobility; minimal at rest) Pain Location: L LE pain (chronic) with ambulation Pain Descriptors / Indicators: Sharp Pain Intervention(s): Limited activity within patient's tolerance;Monitored during session;Repositioned  HR WFL during session. O2 90-92% on room air with functional mobility.    Home Living Family/patient expects to be discharged to:: Private residence Living Arrangements: Alone Available Help at Discharge: Family Type of Home: House Home Access: Stairs to enter Entrance Stairs-Rails: Right Entrance Stairs-Number of Steps: [place]: One level Home Equipment: [place] - 2 wheels;Kasandra Knudsen - single point Additional Comments: Lives alone but has stayed with pt's son for past 2 weeks (above information pt's home set-up).    Prior Function Level of Independence: Independent with assistive device(s)         Comments: Per pt's son's description, pt appears to be a "furniture cruiser" with functional mobility within the home.  Uses SPC or RW outside of home.     Hand Dominance        Extremity/Trunk Assessment   Upper Extremity Assessment Upper Extremity Assessment: Generalized weakness    Lower Extremity Assessment Lower Extremity Assessment: Generalized weakness    Cervical / Trunk Assessment Cervical / Trunk Assessment: Normal  Communication   Communication: HOH  Cognition Arousal/Alertness:  Awake/alert Behavior During Therapy: WFL for tasks assessed/performed Overall Cognitive Status: History of cognitive impairments - at baseline                                        General Comments General comments (skin integrity, edema, etc.): Pt's son present during session.  Nursing cleared pt for participation in physical therapy.  Pt and pt's son agreeable to PT session.  Pt's son initially verbalizing concerns regarding prior family members hospital stay at [facility] and staff limiting family member's mobility d/t pt being labeled a "fall risk" and causing issues and wanting to make sure things like this did not happen again (nursing present during this time at beginning of session and aware of concerns).    Exercises     Assessment/Plan    PT Assessment Patient needs continued PT services  PT Problem List Decreased strength;Decreased balance;Pain       PT Treatment Interventions DME instruction;Gait training;Stair training;Functional mobility training;Therapeutic activities;Therapeutic exercise;Balance training;Patient/family education    PT Goals (Current goals can be found in the Care Plan section)  Acute Rehab PT Goals Patient Stated Goal: to discharge today PT Goal Formulation: With patient/family Time For Goal Achievement:  06/15/17 Potential to Achieve Goals: Good    Frequency Min 2X/week   Barriers to discharge        Co-evaluation               AM-PAC PT "6 Clicks" Daily Activity  Outcome Measure Difficulty turning over in bed (including adjusting bedclothes, sheets and blankets)?: None Difficulty moving from lying on back to sitting on the side of the bed? : None Difficulty sitting down on and standing up from a chair with arms (e.g., wheelchair, bedside commode, etc,.)?: None Help needed moving to and from a bed to chair (including a wheelchair)?: A Little Help needed walking in hospital room?: A Little Help needed climbing 3-5 steps with  a railing? : A Little 6 Click Score: 21    End of Session Equipment Utilized During Treatment: Gait belt Activity Tolerance: Patient tolerated treatment well Patient left: in bed;with call bell/phone within reach;with family/visitor present (bed alarm not on upon PT entry and left off end of session (nursing notified)) Nurse Communication: Mobility status;Precautions PT Visit Diagnosis: Difficulty in walking, not elsewhere classified (R26.2);Muscle weakness (generalized) (M62.81)    Time: 7619-5093 PT Time Calculation (min) (ACUTE ONLY): 40 min   Charges:   PT Evaluation $PT Eval Low Complexity: 1 Low     PT G Codes:   PT G-Codes **NOT FOR INPATIENT CLASS** Functional Assessment Tool Used: AM-PAC 6 Clicks Basic Mobility Functional Limitation: Mobility: Walking and moving around Mobility: Walking and Moving Around Current Status (O6712): At least 20 percent but less than 40 percent impaired, limited or restricted Mobility: Walking and Moving Around Goal Status [phone]): At least 1 percent but less than 20 percent impaired, limited or restricted    [facility], PT 06/01/17, 1:22 PM [phone]

## 2017-06-01 NOTE — Progress Notes (Signed)
*  PRELIMINARY RESULTS* Echocardiogram 2D Echocardiogram has been performed.  Janice Hoffman Janice Hoffman 06/01/2017, 9:38 AM

## 2017-06-14 ENCOUNTER — Observation Stay
Admission: EM | Admit: 2017-06-14 | Discharge: 2017-06-17 | Disposition: A | Discharge status: Home / Self Care | Payer: Medicare Other | Attending: Internal Medicine | Admitting: Internal Medicine

## 2017-06-14 ENCOUNTER — Emergency Department: Payer: Medicare Other

## 2017-06-14 DIAGNOSIS — E876 Hypokalemia: Secondary | ICD-10-CM | POA: Diagnosis not present

## 2017-06-14 DIAGNOSIS — G8929 Other chronic pain: Secondary | ICD-10-CM | POA: Diagnosis not present

## 2017-06-14 DIAGNOSIS — Z79899 Other long term (current) drug therapy: Secondary | ICD-10-CM | POA: Diagnosis not present

## 2017-06-14 DIAGNOSIS — L989 Disorder of the skin and subcutaneous tissue, unspecified: Secondary | ICD-10-CM | POA: Diagnosis not present

## 2017-06-14 DIAGNOSIS — J449 Chronic obstructive pulmonary disease, unspecified: Secondary | ICD-10-CM | POA: Insufficient documentation

## 2017-06-14 DIAGNOSIS — D320 Benign neoplasm of cerebral meninges: Secondary | ICD-10-CM | POA: Insufficient documentation

## 2017-06-14 DIAGNOSIS — I4891 Unspecified atrial fibrillation: Secondary | ICD-10-CM | POA: Diagnosis present

## 2017-06-14 DIAGNOSIS — R109 Unspecified abdominal pain: Secondary | ICD-10-CM | POA: Insufficient documentation

## 2017-06-14 DIAGNOSIS — K5900 Constipation, unspecified: Secondary | ICD-10-CM

## 2017-06-14 DIAGNOSIS — M25562 Pain in left knee: Secondary | ICD-10-CM | POA: Diagnosis not present

## 2017-06-14 DIAGNOSIS — K5909 Other constipation: Secondary | ICD-10-CM | POA: Insufficient documentation

## 2017-06-14 DIAGNOSIS — R42 Dizziness and giddiness: Secondary | ICD-10-CM | POA: Insufficient documentation

## 2017-06-14 DIAGNOSIS — I10 Essential (primary) hypertension: Secondary | ICD-10-CM | POA: Diagnosis not present

## 2017-06-14 DIAGNOSIS — I482 Chronic atrial fibrillation, unspecified: Secondary | ICD-10-CM | POA: Diagnosis present

## 2017-06-14 DIAGNOSIS — S0100XA Unspecified open wound of scalp, initial encounter: Secondary | ICD-10-CM

## 2017-06-14 LAB — COMPREHENSIVE METABOLIC PANEL
ALT: 16 U/L (ref 14–54)
ANION GAP: 9 (ref 5–15)
AST: 22 U/L (ref 15–41)
Albumin: 4 g/dL (ref 3.5–5.0)
Alkaline Phosphatase: 35 U/L — ABNORMAL LOW (ref 38–126)
BILIRUBIN TOTAL: 0.8 mg/dL (ref 0.3–1.2)
BUN: 14 mg/dL (ref 6–20)
CALCIUM: 9.2 mg/dL (ref 8.9–10.3)
CO2: 25 mmol/L (ref 22–32)
Chloride: 100 mmol/L — ABNORMAL LOW (ref 101–111)
Creatinine, Ser: 0.65 mg/dL (ref 0.44–1.00)
GFR calc Af Amer: >60 mL/min (ref >60)
Glucose, Bld: 149 mg/dL — ABNORMAL HIGH (ref 65–99)
POTASSIUM: 3.4 mmol/L — AB (ref 3.5–5.1)
Sodium: 134 mmol/L — ABNORMAL LOW (ref 135–145)
TOTAL PROTEIN: 6.8 g/dL (ref 6.5–8.1)

## 2017-06-14 LAB — CBC WITH DIFFERENTIAL/PLATELET
Basophils Absolute: 0.1 10*3/uL (ref 0–0.1)
Basophils Relative: 1 %
Eosinophils Absolute: 0.2 10*3/uL (ref 0–0.7)
Eosinophils Relative: 2 %
HEMATOCRIT: 38.1 % (ref 35.0–47.0)
Hemoglobin: 13.1 g/dL (ref 12.0–16.0)
LYMPHS ABS: 1.2 10*3/uL (ref 1.0–3.6)
LYMPHS PCT: 13 %
MCH: 30.5 pg (ref 26.0–34.0)
MCHC: 34.4 g/dL (ref 32.0–36.0)
MCV: 88.7 fL (ref 80.0–100.0)
MONO ABS: 0.9 10*3/uL (ref 0.2–0.9)
MONOS PCT: 10 %
NEUTROS ABS: 6.4 10*3/uL (ref 1.4–6.5)
Neutrophils Relative %: 74 %
Platelets: 201 10*3/uL (ref 150–440)
RBC: 4.3 MIL/uL (ref 3.80–5.20)
RDW: 14 % (ref 11.5–14.5)
WBC: 8.7 10*3/uL (ref 3.6–11.0)

## 2017-06-14 LAB — LIPASE, BLOOD: LIPASE: 27 U/L (ref 11–51)

## 2017-06-14 LAB — TROPONIN I: Troponin I: <0.03 ng/mL (ref <0.03)

## 2017-06-14 NOTE — ED Notes (Signed)
Report from teresa, rn.  

## 2017-06-14 NOTE — ED Notes (Signed)
Pt states "i wish you would hurry up and do something for me". Pt requesting sprite and md assessment.

## 2017-06-14 NOTE — ED Triage Notes (Signed)
Pt arrived via ems from home - pt reports she has a sore in the top of her head that has been present for the last year that she would like evaluated - pt also c/o no BM for the last 2-3 days - denies abd pain - denies diarrhea - able to pass gas

## 2017-06-14 NOTE — ED Provider Notes (Signed)
[facility] Emergency Department Provider Note   ____________________________________________   First MD Initiated Contact with Patient 06/14/17 2312     (approximate)  I have reviewed the triage vital signs and the nursing notes.   HISTORY  Chief Complaint Sore and Constipation    HPI Janice Hoffman is a 83 y.o. female who presents to the ED from home with multiple medical complaints:  1. Patient's most pressing complaint is a sore on the top of her scalp which has been present for over one year. Had shingles of her scalp and face and thinks this is a residual wound. States over the past year sometimes weeps. Daughter states she has never seen a week. Patient was recently placed on doxycycline by [facility] which dried the wound up and it scabbed over. She called back to ask for a second round of antibiotics and was prescribed Septra which she started 2 days ago. Daughter thinks Sarina Ill is contributing to her GI distress so has stopped it. They were expecting a dermatology referral for this 3 weeks ago but states they have not been called with an appointment. 2. Chronic knee pain 7 years since surgery. 3. Chronic constipation, on Senokot. Usually takes Metamucil but has not taken it in 3 weeks. Last bowel movement was 2-3 days ago. Patient does not complaint of abdominal pain and states she is still passing gas. Denies fever, chills, chest pain, shortness of breath, nausea, vomiting. Denies recent travel or trauma. 4. Dizziness which she describes as a sensation of motion which is worsened with standing upright. Patient was hospitalized 9/22-9/23/2018 for this complaint with CT and MRI of the brain which were essentially normal except for incidental meningioma. She was prescribed meclizine for this.   Past Medical History:  Diagnosis Date  . Asthma   . COPD (chronic obstructive pulmonary disease) ([place])   . Herpes zoster   . Hypertension   . Neuralgia      Patient Active Problem List   Diagnosis Date Noted  . New onset a-fib ([place]) 06/15/2017  . Near syncope 05/31/2017  . COPD with acute exacerbation ([place]) 01/05/2017  . Essential hypertension, malignant 01/05/2017  . Dysphagia 01/05/2017  . COPD exacerbation ([place]) 03/15/2016  . 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 ELEVATED BLOOD PRESSURE 05/12/2008  . HERPES ZOSTER 04/26/2008  . B12 DEFICIENCY 04/26/2008  . CONSTIPATION 04/26/2008  . LEG EDEMA, BILATERAL 04/26/2008    Past Surgical History:  Procedure Laterality Date  . LEG SURGERY      Prior to Admission medications   Medication Sig Start Date End Date Taking? Authorizing Provider  acetaminophen (TYLENOL) 325 MG tablet Take 650 mg by mouth every 6 (six) hours as needed.   Yes [provider]  albuterol (PROVENTIL) (5 MG/ML) 0.5% nebulizer solution Take 2.5 mg by nebulization every 6 (six) hours as needed for wheezing or shortness of breath.   Yes [provider]  amLODipine (NORVASC) 10 MG tablet Take 1 tablet (10 mg total) by mouth daily. 06/01/17  Yes Mody, Ulice Bold, MD  Fluticasone-Salmeterol (ADVAIR) 500-50 MCG/DOSE AEPB Inhale 1 puff into the lungs 2 (two) times daily.   Yes [provider]  ipratropium-albuterol (DUONEB) 0.5-2.5 (3) MG/3ML SOLN Take 3 mLs by nebulization every 6 (six) hours as needed.   Yes [provider]  meclizine (ANTIVERT) 12.5 MG tablet Take 12.5 mg by mouth 3 (three) times daily as needed for dizziness.   Yes [provider]  senna (SENOKOT) 8.6 MG TABS tablet  Take 1 tablet by mouth daily as needed for mild constipation.   Yes [provider]  tiotropium (SPIRIVA) 18 MCG inhalation capsule Place 1 capsule (18 mcg total) into inhaler and inhale daily. 01/07/17  Yes Max Sane, MD  traMADol (ULTRAM) 50 MG tablet Take 50 mg by mouth every 6 (six) hours as needed. Reported on 03/14/2016   Yes [provider]  albuterol (PROVENTIL HFA;VENTOLIN HFA)  108 (90 Base) MCG/ACT inhaler Inhale 2 puffs into the lungs every 6 (six) hours as needed for wheezing or shortness of breath.    [provider]  feeding supplement, ENSURE ENLIVE, (ENSURE ENLIVE) LIQD Take 237 mLs by mouth 2 (two) times daily between meals. 03/18/16   [name], MD    Allergies Nsaids; Penicillins; and Promethazine hcl  Family History  Problem Relation Age of Onset  . COPD Sister     Social History Social History  Substance Use Topics  . Smoking status: Never Smoker  . Smokeless tobacco: Never Used  . Alcohol use No    Review of Systems  Constitutional: No fever/chills. Eyes: No visual changes. ENT: No sore throat. Cardiovascular: Denies chest pain. Respiratory: Denies shortness of breath. Gastrointestinal: No abdominal pain.  No nausea, no vomiting.  No diarrhea.  positive for constipation. Genitourinary: Negative for dysuria. Musculoskeletal: Negative for back pain. Skin: positive for scalp wound. Negative for rash. Neurological: positive for dizziness. Negative for headaches, focal weakness or numbness.   ____________________________________________   PHYSICAL EXAM:  VITAL SIGNS: ED Triage Vitals  Enc Vitals Group     BP 06/14/17 2242 (!) 157/74     Pulse Rate 06/14/17 2242 (!) 133     Resp 06/14/17 2246 15     Temp 06/14/17 2246 98.4 F (36.9 C)     Temp Source 06/14/17 2246 Oral     SpO2 06/14/17 2242 (!) 77 %     Weight 06/14/17 2247 120 lb (54.4 kg)     Height 06/14/17 2247 5\' 4"  (1.626 m)     Head Circumference --      Peak Flow --      Pain Score 06/14/17 2246 8     Pain Loc --      Pain Edu? --      Excl. in [place]? --     Constitutional: Alert and oriented. Well appearing and in no acute distress. Eyes: Conjunctivae are normal. PERRL. EOMI. Head: Dime-sized area of irritation on the vertex of scalp without associated warmth, erythema or fluctuance. No weeping. Ears: Bilateral TMs within normal limits. Nose: No  congestion/rhinnorhea. Mouth/Throat: Mucous membranes are moist.  Oropharynx non-erythematous. Neck: No stridor.  No cervical spine tenderness to palpation.  No carotid bruits. Cardiovascular: Normal rate, irregular rhythm. Grossly normal heart sounds.  Good peripheral circulation. Respiratory: Normal respiratory effort.  No retractions. Lungs CTAB. Gastrointestinal: Soft and nontender to light and deep palpation. No distention. No abdominal bruits. No CVA tenderness. Musculoskeletal: No lower extremity tenderness nor edema.  No joint effusions. Neurologic:  Normal speech and language. No gross focal neurologic deficits are appreciated. Skin:  Skin is warm, dry and intact. No rash noted. Psychiatric: Mood and affect are normal. Speech and behavior are normal.  ____________________________________________   LABS (all labs ordered are listed, but only abnormal results are displayed)  Labs Reviewed  COMPREHENSIVE METABOLIC PANEL - Abnormal; Notable for the following:       Result Value   Sodium 134 (*)    Potassium 3.4 (*)  Chloride 100 (*)    Glucose, Bld 149 (*)    Alkaline Phosphatase 35 (*)    All other components within normal limits  URINALYSIS, COMPLETE (UACMP) WITH MICROSCOPIC - Abnormal; Notable for the following:    Color, Urine YELLOW (*)    APPearance CLEAR (*)    Glucose, UA 50 (*)    All other components within normal limits  TSH - Abnormal; Notable for the following:    TSH 0.289 (*)    All other components within normal limits  CBC WITH DIFFERENTIAL/PLATELET  LIPASE, BLOOD  TROPONIN I   ____________________________________________  EKG  ED ECG REPORT I, SUNG,JADE J, the attending physician, personally viewed and interpreted this ECG.   Date: 06/15/2017  EKG Time: 0052  Rate: 97  Rhythm: atrial fibrillation, rate 97  Axis: LAD  Intervals:left bundle branch block  ST&T Change: nonspecific significant change from 9/22 which demonstrated normal sinus  rhythm  ____________________________________________  RADIOLOGY  Dg Abdomen Acute W/chest  Result Date: 06/14/2017 CLINICAL DATA:  No bowel movement for 2-3 days, constipation EXAM: DG ABDOMEN ACUTE W/ 1V CHEST COMPARISON:  Chest radiograph 05/31/2017 FINDINGS: Normal heart size, mediastinal contours, and pulmonary vascularity. Atherosclerotic calcification aorta. Emphysematous changes without infiltrate, pleural effusion, or pneumothorax. Skin folds project over lower lateral RIGHT chest. Diffuse osseous demineralization with pronounced thoracolumbar scoliosis. Nonobstructive bowel gas pattern with normal retained stool burden. No bowel dilatation, bowel wall thickening or free air. IMPRESSION: Emphysematous changes without acute infiltrate. Nonobstructive bowel gas pattern. Electronically Signed   By: Lavonia Dana M.D.   On: 06/14/2017 23:54    ____________________________________________   PROCEDURES  Procedure(s) performed: None  Procedures  Critical Care performed: No  ____________________________________________   INITIAL IMPRESSION / ASSESSMENT AND PLAN / ED COURSE    83 year old female who presents with a litany of medical complaints, most of them chronic with the exception of dizziness which has been ongoing for the past 3 weeks. Reviewed patient's chart and noted recent hospitalization with normal CT and MRI with the exception of incidental meningioma. She has been referred to ENT for follow-up of this and placed on meclizine. she has also been referred to dermatology for biopsy of scalp wound. laboratory results are unremarkable. Awaiting urinalysis. Will obtain orthostatic vital signs and infuse fluids. closing for dizziness. Differential diagnosis includes TIA, CVA, vertigo, cardiac, infectious etiologies. Clinical Course as of Jun 15 554  Sun Jun 15, 2017  0139 Atrial fibrillation noted on EKG and monitor. Patient and daughter-in-law denies history of such. This most  likely would explain the etiology of patient's recent dizziness. Also, patient has not had issues with hypertension until 3 weeks ago and she was started on amlodipine. Will discuss with hospitalist to evaluate patient in the emergency department for admission.  [JS]    Clinical Course User Index [JS] Paulette Blanch, MD     ____________________________________________   FINAL CLINICAL IMPRESSION(S) / ED DIAGNOSES  Final diagnoses:  Constipation, unspecified constipation type  Open wound of scalp, unspecified open wound type, initial encounter  Dizziness  New onset atrial fibrillation ([place])      NEW MEDICATIONS STARTED DURING THIS VISIT:  Current Discharge Medication List       Note:  This document was prepared using Dragon voice recognition software and may include unintentional dictation errors.    Paulette Blanch, MD 06/15/17 [phone]

## 2017-06-15 DIAGNOSIS — I4891 Unspecified atrial fibrillation: Secondary | ICD-10-CM | POA: Diagnosis present

## 2017-06-15 DIAGNOSIS — I482 Chronic atrial fibrillation, unspecified: Secondary | ICD-10-CM | POA: Diagnosis present

## 2017-06-15 LAB — URINALYSIS, COMPLETE (UACMP) WITH MICROSCOPIC
BACTERIA UA: NONE SEEN
BILIRUBIN URINE: NEGATIVE
Glucose, UA: 50 mg/dL — AB
Hgb urine dipstick: NEGATIVE
Ketones, ur: NEGATIVE mg/dL
Leukocytes, UA: NEGATIVE
Nitrite: NEGATIVE
PROTEIN: NEGATIVE mg/dL
RBC / HPF: NONE SEEN RBC/hpf (ref 0–5)
SPECIFIC GRAVITY, URINE: 1.009 (ref 1.005–1.030)
SQUAMOUS EPITHELIAL / LPF: NONE SEEN
pH: 5 (ref 5.0–8.0)

## 2017-06-15 LAB — TSH: TSH: 0.289 u[IU]/mL — ABNORMAL LOW (ref 0.350–4.500)

## 2017-06-15 MED ORDER — POTASSIUM CHLORIDE CRYS ER 10 MEQ PO TBCR
10.0000 meq | EXTENDED_RELEASE_TABLET | Freq: Every day | ORAL | Status: DC
  Administered 2017-06-15 – 2017-06-16 (×2): 10 meq via ORAL
  Filled 2017-06-15 (×2): qty 1

## 2017-06-15 MED ORDER — ONDANSETRON HCL 4 MG/2ML IJ SOLN: 4.0000 mg | Freq: Four times a day (QID) | INTRAMUSCULAR | Status: DC | PRN

## 2017-06-15 MED ORDER — TIOTROPIUM BROMIDE MONOHYDRATE 18 MCG IN CAPS
18.0000 ug | ORAL_CAPSULE | Freq: Every day | RESPIRATORY_TRACT | Status: DC
  Administered 2017-06-15 – 2017-06-16 (×2): 18 ug via RESPIRATORY_TRACT
  Filled 2017-06-15: qty 5

## 2017-06-15 MED ORDER — LABETALOL HCL 5 MG/ML IV SOLN: 5.0000 mg | INTRAVENOUS | Status: DC | PRN

## 2017-06-15 MED ORDER — IPRATROPIUM-ALBUTEROL 0.5-2.5 (3) MG/3ML IN SOLN
3.0000 mL | Freq: Four times a day (QID) | RESPIRATORY_TRACT | Status: DC | PRN
  Administered 2017-06-15 (×2): 3 mL via RESPIRATORY_TRACT
  Filled 2017-06-15 (×2): qty 3

## 2017-06-15 MED ORDER — TRAMADOL HCL 50 MG PO TABS
50.0000 mg | ORAL_TABLET | Freq: Four times a day (QID) | ORAL | Status: DC | PRN
  Administered 2017-06-16: 50 mg via ORAL
  Filled 2017-06-15: qty 1

## 2017-06-15 MED ORDER — BISACODYL 10 MG RE SUPP: 10.0000 mg | Freq: Every day | RECTAL | Status: DC | PRN

## 2017-06-15 MED ORDER — POTASSIUM CHLORIDE CRYS ER 20 MEQ PO TBCR
40.0000 meq | EXTENDED_RELEASE_TABLET | Freq: Once | ORAL | Status: AC
  Administered 2017-06-15: 40 meq via ORAL
  Filled 2017-06-15: qty 2

## 2017-06-15 MED ORDER — ONDANSETRON HCL 4 MG PO TABS: 4.0000 mg | ORAL_TABLET | Freq: Four times a day (QID) | ORAL | Status: DC | PRN

## 2017-06-15 MED ORDER — MECLIZINE HCL 25 MG PO TABS
12.5000 mg | ORAL_TABLET | Freq: Three times a day (TID) | ORAL | Status: DC | PRN
  Filled 2017-06-15: qty 0.5

## 2017-06-15 MED ORDER — SODIUM CHLORIDE 0.9 % IV BOLUS (SEPSIS)
500.0000 mL | Freq: Once | INTRAVENOUS | Status: AC
  Administered 2017-06-15: 500 mL via INTRAVENOUS

## 2017-06-15 MED ORDER — DOCUSATE SODIUM 100 MG PO CAPS
100.0000 mg | ORAL_CAPSULE | Freq: Two times a day (BID) | ORAL | Status: DC
  Administered 2017-06-15 – 2017-06-16 (×4): 100 mg via ORAL
  Filled 2017-06-15 (×4): qty 1

## 2017-06-15 MED ORDER — ENSURE ENLIVE PO LIQD
237.0000 mL | Freq: Two times a day (BID) | ORAL | Status: DC
  Administered 2017-06-15 – 2017-06-16 (×3): 237 mL via ORAL

## 2017-06-15 MED ORDER — ACETAMINOPHEN 325 MG PO TABS
650.0000 mg | ORAL_TABLET | Freq: Four times a day (QID) | ORAL | Status: DC | PRN
Start: 2017-06-15 — End: 2017-06-17
  Administered 2017-06-16 – 2017-06-17 (×2): 650 mg via ORAL
  Filled 2017-06-15 (×2): qty 2

## 2017-06-15 MED ORDER — LIDOCAINE VISCOUS 2 % MT SOLN
15.0000 mL | Freq: Once | OROMUCOSAL | Status: AC
  Administered 2017-06-15: 15 mL via OROMUCOSAL
  Filled 2017-06-15: qty 15

## 2017-06-15 MED ORDER — AMLODIPINE BESYLATE 5 MG PO TABS
10.0000 mg | ORAL_TABLET | Freq: Every day | ORAL | Status: DC
  Administered 2017-06-15 – 2017-06-16 (×2): 10 mg via ORAL
  Filled 2017-06-15 (×2): qty 2

## 2017-06-15 MED ORDER — ACETAMINOPHEN 650 MG RE SUPP: 650.0000 mg | Freq: Four times a day (QID) | RECTAL | Status: DC | PRN

## 2017-06-15 MED ORDER — MECLIZINE HCL 25 MG PO TABS
25.0000 mg | ORAL_TABLET | Freq: Once | ORAL | Status: AC
  Administered 2017-06-15: 25 mg via ORAL
  Filled 2017-06-15: qty 1

## 2017-06-15 MED ORDER — MOMETASONE FURO-FORMOTEROL FUM 200-5 MCG/ACT IN AERO
2.0000 | INHALATION_SPRAY | Freq: Two times a day (BID) | RESPIRATORY_TRACT | Status: DC
  Administered 2017-06-15 – 2017-06-16 (×4): 2 via RESPIRATORY_TRACT
  Filled 2017-06-15: qty 8.8

## 2017-06-15 MED ORDER — POLYETHYLENE GLYCOL 3350 17 G PO PACK
17.0000 g | PACK | Freq: Every day | ORAL | Status: DC
Start: 2017-06-15 — End: 2017-06-17
  Administered 2017-06-15: 17 g via ORAL
  Filled 2017-06-15 (×2): qty 1

## 2017-06-15 MED ORDER — CLOPIDOGREL BISULFATE 75 MG PO TABS
75.0000 mg | ORAL_TABLET | Freq: Every day | ORAL | Status: DC
  Administered 2017-06-15: 75 mg via ORAL
  Filled 2017-06-15: qty 1

## 2017-06-15 MED ORDER — APIXABAN 5 MG PO TABS: 5.0000 mg | ORAL_TABLET | Freq: Two times a day (BID) | ORAL | Status: DC

## 2017-06-15 MED ORDER — APIXABAN 2.5 MG PO TABS
2.5000 mg | ORAL_TABLET | Freq: Two times a day (BID) | ORAL | Status: DC
  Administered 2017-06-15 – 2017-06-17 (×5): 2.5 mg via ORAL
  Filled 2017-06-15 (×5): qty 1

## 2017-06-15 MED ORDER — DOXYCYCLINE HYCLATE 100 MG PO TABS
100.0000 mg | ORAL_TABLET | Freq: Two times a day (BID) | ORAL | Status: DC
  Administered 2017-06-15 – 2017-06-17 (×4): 100 mg via ORAL
  Filled 2017-06-15 (×4): qty 1

## 2017-06-15 MED ORDER — ENOXAPARIN SODIUM 40 MG/0.4ML ~~LOC~~ SOLN: 40.0000 mg | SUBCUTANEOUS | Status: DC

## 2017-06-15 MED ORDER — SENNA 8.6 MG PO TABS
1.0000 | ORAL_TABLET | Freq: Every day | ORAL | Status: DC | PRN
  Administered 2017-06-15: 8.6 mg via ORAL
  Filled 2017-06-15: qty 1

## 2017-06-15 NOTE — Progress Notes (Signed)
Patient is admitted to room 259 with the diagnosis of new onset of Afib. Alert and oriented x 4. Denied any acute pain  Right now. Tele box called to [facility] with Nallely NT as a second verifier. Skin assessment done with Sabra Heck RN, noted sore on top of her head. Patient received duoned breathing treatment PRN for c/o SOB. Patient is very talkative telling the RN her life stories. Patient voiced no concerns. Will continue to monitor.

## 2017-06-15 NOTE — Progress Notes (Signed)
C/o being unable to "get air".  PO2 = 94%.  Duoneb tx given with some improvement

## 2017-06-15 NOTE — Consult Note (Signed)
[facility]  CARDIOLOGY CONSULT NOTE  Patient ID: AMBERT Hoffman MRN: [id number] DOB/AGE: Jan 03, 1926 83 y.o.  Admit date: 06/14/2017 Referring Physician Dr. Margaretmary Eddy Primary Physician Dr. Gilford Rile Primary Cardiologist   Reason for Consultation afib  HPI: Patient is an 83 year old female with no prior cardiac historywho was admitted after presenting to the emergency room complaining of post herpetic neuralgia as well as pain in the top of her head as well as constipation. She also has chronic vertiginous sounding dizziness. She denied chest pain or shortness of breath. On electrocardiogram she was noted to have atrial fibrillation which had not been previously noted. Unaware of an irregular heartbeat.her abdominal x-ray showed no significant abnormalities. She had a recent carotid Doppler done in September of last year which revealed no hemodynamically significant disease. Brain MRI in September showed no acute intracranial abnormality.Her labs showed normal serum troponin. TSH was mildly low at 0.289.serum sodium is 134. Her creatinine is 0.65.her main concern is the lesion on the top of her head.she states that it is sore and drains a clear fluid.she is dizzy when she rolls over in bed or moving from side to side. She denies falls. She has been treated with amlodipine at 10 mg daily as an outpatient and has been on meclizine.  Review of Systems  HENT: Negative.   Eyes: Negative.   Respiratory: Positive for shortness of breath.   Cardiovascular: Negative.   Gastrointestinal: Negative.   Genitourinary: Negative.   Musculoskeletal: Negative.   Skin:       Lesion on the top of her head she has had shingles.  Neurological: Positive for dizziness and weakness.  Endo/Heme/Allergies: Negative.   Psychiatric/Behavioral: Negative.     Past Medical History:  Diagnosis Date  . Asthma   . COPD (chronic obstructive pulmonary disease) ([place])   . Herpes  zoster   . Hypertension   . Neuralgia     Family History  Problem Relation Age of Onset  . COPD Sister     Social History   Social History  . Marital status: Widowed    Spouse name: N/A  . Number of children: N/A  . Years of education: N/A   Occupational History  . retired    Social History Main Topics  . Smoking status: Never Smoker  . Smokeless tobacco: Never Used  . Alcohol use No  . Drug use: No  . Sexual activity: Not on file   Other Topics Concern  . Not on file   Social History Narrative  . No narrative on file    Past Surgical History:  Procedure Laterality Date  . LEG SURGERY       Prescriptions Prior to Admission  Medication Sig Dispense Refill Last Dose  . acetaminophen (TYLENOL) 325 MG tablet Take 650 mg by mouth every 6 (six) hours as needed.   06/14/2017 at Unknown time  . albuterol (PROVENTIL) (5 MG/ML) 0.5% nebulizer solution Take 2.5 mg by nebulization every 6 (six) hours as needed for wheezing or shortness of breath.   06/14/2017 at Unknown time  . amLODipine (NORVASC) 10 MG tablet Take 1 tablet (10 mg total) by mouth daily. 30 tablet 0 06/14/2017 at Unknown time  . Fluticasone-Salmeterol (ADVAIR) 500-50 MCG/DOSE AEPB Inhale 1 puff into the lungs 2 (two) times daily.   06/14/2017 at Unknown time  . ipratropium-albuterol (DUONEB) 0.5-2.5 (3) MG/3ML SOLN Take 3 mLs by nebulization every 6 (six) hours as needed.   06/14/2017  at Unknown time  . meclizine (ANTIVERT) 12.5 MG tablet Take 12.5 mg by mouth 3 (three) times daily as needed for dizziness.   PRN at PRN  . senna (SENOKOT) 8.6 MG TABS tablet Take 1 tablet by mouth daily as needed for mild constipation.   PRN at PRN  . tiotropium (SPIRIVA) 18 MCG inhalation capsule Place 1 capsule (18 mcg total) into inhaler and inhale daily. 30 capsule 0 06/14/2017 at Unknown time  . traMADol (ULTRAM) 50 MG tablet Take 50 mg by mouth every 6 (six) hours as needed. Reported on 03/14/2016   06/14/2017 at Unknown time  .  albuterol (PROVENTIL HFA;VENTOLIN HFA) 108 (90 Base) MCG/ACT inhaler Inhale 2 puffs into the lungs every 6 (six) hours as needed for wheezing or shortness of breath.   PRN at PRN  . feeding supplement, ENSURE ENLIVE, (ENSURE ENLIVE) LIQD Take 237 mLs by mouth 2 (two) times daily between meals. 237 mL 12     Physical Exam: Blood pressure (!) 153/59, pulse 78, temperature 98.6 F (37 C), temperature source Oral, resp. rate 16, height 5\' 4"  (1.626 m), weight 51.2 kg (112 lb 12.8 oz), SpO2 99 %.   Wt Readings from Last 1 Encounters:  06/15/17 51.2 kg (112 lb 12.8 oz)     General appearance: alert and cooperative Head: Normocephalic, without obvious abnormality, atraumatic, scalp lesions Resp: clear to auscultation bilaterally Cardio: irregularly irregular rhythm GI: soft, non-tender; bowel sounds normal; no masses,  no organomegaly Extremities: extremities normal, atraumatic, no cyanosis or edema Neurologic: Grossly normal Incision/Wound:lesion on the top of her head. Appears excoriated.  Labs:   Lab Results  Component Value Date   WBC 8.7 06/14/2017   HGB 13.1 06/14/2017   HCT 38.1 06/14/2017   MCV 88.7 06/14/2017   PLT 201 06/14/2017    Recent Labs Lab 06/14/17 2331  NA 134*  K 3.4*  CL 100*  CO2 25  BUN 14  CREATININE 0.65  CALCIUM 9.2  PROT 6.8  BILITOT 0.8  ALKPHOS 35*  ALT 16  AST 22  GLUCOSE 149*   Lab Results  Component Value Date   TROPONINI <0.03 06/14/2017      Radiology: no acute process in her abdomen. EKG: atrial fibrillation with variable ventricular response  ASSESSMENT AND PLAN:  83 year old female with no prior cardiac history per her report who was admitted after presenting to the emergency room with complaints of post herpetic neuralgia with a lesion on the top of her head as well as constipation. She also has month. There were no acute processes. She is on meclizine. She has hypertension treated with amlodipine. She denies chest pain. She does  ha occasional shortgram showed atrial fibrillation with a ventricular rate of 75-80.her rate is well controlled. She has no absolute contraindications to chronic anticoagulation however her scalp lesion may be an issue. Will consider placing on Eliquis at 5 mg bid and following for bleeding. Will hold on starting this until scalp lesion is further evaluated and no procedures are needed for this. Will proceed with echo to evaluate lv funciton and valves to guide further therapy.  Signed: Teodoro Spray MD, [facility] 06/15/2017, 9:59 AM

## 2017-06-15 NOTE — H&P (Signed)
Janice Hoffman is an 83 y.o. female.   Chief Complaint: Constipation HPI: The patient with past medical history of COPD, hypertension and postherpetic neuralgia presents emergency department complaining of constipation. The patient states that she's been unable to move her bowels and at least 3 days. She has mild abdominal pain but is also concerned about a sore on the top of her head as well as dizziness. The latter is chronic. She was admitted to the hospital 2 weeks ago for the same and found to have a meningioma. She denies visual symptoms as well as nausea or vomiting. She also complains of soreness in her joints and back. Routine EKG evaluation revealed atrial fibrillation. This is new onset. She denies chest pain, shortness of breath or diaphoresis. In light of her dizziness in addition to new arrhythmia the emergency department staff called the hospitalist service for admission.  Past Medical History:  Diagnosis Date  . Asthma   . COPD (chronic obstructive pulmonary disease) ([place])   . Herpes zoster   . Hypertension   . Neuralgia     Past Surgical History:  Procedure Laterality Date  . LEG SURGERY      Family History  Problem Relation Age of Onset  . COPD Sister    Social History:  reports that she has never smoked. She has never used smokeless tobacco. She reports that she does not drink alcohol or use drugs.  Allergies:  Allergies  Allergen Reactions  . Nsaids Shortness Of Breath    Throat swelling, shortness of breath  . Penicillins     Has patient had a PCN reaction causing immediate rash, facial/tongue/throat swelling, SOB or lightheadedness with hypotension: Unknown Has patient had a PCN reaction causing severe rash involving mucus membranes or skin necrosis: Unknown Has patient had a PCN reaction that required hospitalization: Unknown Has patient had a PCN reaction occurring within the last 10 years: Unknown If all of the above answers are "NO", then may proceed with  Cephalosporin use.   . Promethazine Hcl     Medications Prior to Admission  Medication Sig Dispense Refill  . acetaminophen (TYLENOL) 325 MG tablet Take 650 mg by mouth every 6 (six) hours as needed.    Marland Kitchen albuterol (PROVENTIL) (5 MG/ML) 0.5% nebulizer solution Take 2.5 mg by nebulization every 6 (six) hours as needed for wheezing or shortness of breath.    Marland Kitchen amLODipine (NORVASC) 10 MG tablet Take 1 tablet (10 mg total) by mouth daily. 30 tablet 0  . Fluticasone-Salmeterol (ADVAIR) 500-50 MCG/DOSE AEPB Inhale 1 puff into the lungs 2 (two) times daily.    Marland Kitchen ipratropium-albuterol (DUONEB) 0.5-2.5 (3) MG/3ML SOLN Take 3 mLs by nebulization every 6 (six) hours as needed.    . meclizine (ANTIVERT) 12.5 MG tablet Take 12.5 mg by mouth 3 (three) times daily as needed for dizziness.    . senna (SENOKOT) 8.6 MG TABS tablet Take 1 tablet by mouth daily as needed for mild constipation.    Marland Kitchen tiotropium (SPIRIVA) 18 MCG inhalation capsule Place 1 capsule (18 mcg total) into inhaler and inhale daily. 30 capsule 0  . traMADol (ULTRAM) 50 MG tablet Take 50 mg by mouth every 6 (six) hours as needed. Reported on 03/14/2016    . albuterol (PROVENTIL HFA;VENTOLIN HFA) 108 (90 Base) MCG/ACT inhaler Inhale 2 puffs into the lungs every 6 (six) hours as needed for wheezing or shortness of breath.    . feeding supplement, ENSURE ENLIVE, (ENSURE ENLIVE) LIQD Take 237 mLs by  mouth 2 (two) times daily between meals. 237 mL 12    Results for orders placed or performed during the hospital encounter of 06/14/17 (from the past 48 hour(s))  CBC with Differential     Status: None   Collection Time: 06/14/17 11:31 PM  Result Value Ref Range   WBC 8.7 3.6 - 11.0 K/uL   RBC 4.30 3.80 - 5.20 MIL/uL   Hemoglobin 13.1 12.0 - 16.0 g/dL   HCT 38.1 35.0 - 47.0 %   MCV 88.7 80.0 - 100.0 fL   MCH 30.5 26.0 - 34.0 pg   MCHC 34.4 32.0 - 36.0 g/dL   RDW 14.0 11.5 - 14.5 %   Platelets 201 150 - 440 K/uL   Neutrophils Relative % 74 %    Neutro Abs 6.4 1.4 - 6.5 K/uL   Lymphocytes Relative 13 %   Lymphs Abs 1.2 1.0 - 3.6 K/uL   Monocytes Relative 10 %   Monocytes Absolute 0.9 0.2 - 0.9 K/uL   Eosinophils Relative 2 %   Eosinophils Absolute 0.2 0 - 0.7 K/uL   Basophils Relative 1 %   Basophils Absolute 0.1 0 - 0.1 K/uL  Comprehensive metabolic panel     Status: Abnormal   Collection Time: 06/14/17 11:31 PM  Result Value Ref Range   Sodium 134 (L) 135 - 145 mmol/L   Potassium 3.4 (L) 3.5 - 5.1 mmol/L   Chloride 100 (L) 101 - 111 mmol/L   CO2 25 22 - 32 mmol/L   Glucose, Bld 149 (H) 65 - 99 mg/dL   BUN 14 6 - 20 mg/dL   Creatinine, Ser 0.65 0.44 - 1.00 mg/dL   Calcium 9.2 8.9 - 10.3 mg/dL   Total Protein 6.8 6.5 - 8.1 g/dL   Albumin 4.0 3.5 - 5.0 g/dL   AST 22 15 - 41 U/L   ALT 16 14 - 54 U/L   Alkaline Phosphatase 35 (L) 38 - 126 U/L   Total Bilirubin 0.8 0.3 - 1.2 mg/dL   GFR calc non Af Amer >60 >60 mL/min   GFR calc Af Amer >60 >60 mL/min    Comment: (NOTE) The eGFR has been calculated using the CKD EPI equation. This calculation has not been validated in all clinical situations. eGFR's persistently <60 mL/min signify possible Chronic Kidney Disease.    Anion gap 9 5 - 15  Lipase, blood     Status: None   Collection Time: 06/14/17 11:31 PM  Result Value Ref Range   Lipase 27 11 - 51 U/L  Troponin I     Status: None   Collection Time: 06/14/17 11:31 PM  Result Value Ref Range   Troponin I <0.03 <0.03 ng/mL  TSH     Status: Abnormal   Collection Time: 06/14/17 11:31 PM  Result Value Ref Range   TSH 0.289 (L) 0.350 - 4.500 uIU/mL    Comment: Performed by a 3rd Generation assay with a functional sensitivity of <=0.01 uIU/mL.  Urinalysis, Complete w Microscopic     Status: Abnormal   Collection Time: 06/15/17 12:49 AM  Result Value Ref Range   Color, Urine YELLOW (A) YELLOW   APPearance CLEAR (A) CLEAR   Specific Gravity, Urine 1.009 1.005 - 1.030   pH 5.0 5.0 - 8.0   Glucose, UA 50 (A) NEGATIVE  mg/dL   Hgb urine dipstick NEGATIVE NEGATIVE   Bilirubin Urine NEGATIVE NEGATIVE   Ketones, ur NEGATIVE NEGATIVE mg/dL   Protein, ur NEGATIVE NEGATIVE mg/dL  Nitrite NEGATIVE NEGATIVE   Leukocytes, UA NEGATIVE NEGATIVE   RBC / HPF NONE SEEN 0 - 5 RBC/hpf   WBC, UA 0-5 0 - 5 WBC/hpf   Bacteria, UA NONE SEEN NONE SEEN   Squamous Epithelial / LPF NONE SEEN NONE SEEN   Mucus PRESENT    Dg Abdomen Acute W/chest  Result Date: 06/14/2017 CLINICAL DATA:  No bowel movement for 2-3 days, constipation EXAM: DG ABDOMEN ACUTE W/ 1V CHEST COMPARISON:  Chest radiograph 05/31/2017 FINDINGS: Normal heart size, mediastinal contours, and pulmonary vascularity. Atherosclerotic calcification aorta. Emphysematous changes without infiltrate, pleural effusion, or pneumothorax. Skin folds project over lower lateral RIGHT chest. Diffuse osseous demineralization with pronounced thoracolumbar scoliosis. Nonobstructive bowel gas pattern with normal retained stool burden. No bowel dilatation, bowel wall thickening or free air. IMPRESSION: Emphysematous changes without acute infiltrate. Nonobstructive bowel gas pattern. Electronically Signed   By: Lavonia Dana M.D.   On: 06/14/2017 23:54    Review of Systems  Constitutional: Negative for chills and fever.  HENT: Negative for sore throat and tinnitus.   Eyes: Negative for blurred vision and redness.  Respiratory: Negative for cough and shortness of breath.   Cardiovascular: Negative for chest pain, palpitations, orthopnea and PND.  Gastrointestinal: Positive for constipation. Negative for abdominal pain, diarrhea, nausea and vomiting.  Genitourinary: Negative for dysuria, frequency and urgency.  Musculoskeletal: Positive for joint pain. Negative for myalgias.  Skin: Negative for rash.       No lesions  Neurological: Positive for dizziness. Negative for speech change, focal weakness and weakness.  Endo/Heme/Allergies: Does not bruise/bleed easily.       No temperature  intolerance  Psychiatric/Behavioral: Negative for depression and suicidal ideas.    Blood pressure (!) 155/81, pulse 95, temperature 98.4 F (36.9 C), temperature source Oral, resp. rate 18, height '5\' 4"'  (1.626 m), weight 51.2 kg (112 lb 12.8 oz), SpO2 98 %. Physical Exam  Vitals reviewed. Constitutional: She is oriented to person, place, and time. She appears well-developed and well-nourished. No distress.  HENT:  Head: Normocephalic and atraumatic.  Mouth/Throat: Oropharynx is clear and moist.  Eyes: Pupils are equal, round, and reactive to light. Conjunctivae and EOM are normal. No scleral icterus.  Neck: Normal range of motion. No tracheal deviation present. No thyromegaly present.  Cardiovascular: Normal rate, regular rhythm and normal heart sounds.  Exam reveals no gallop and no friction rub.   No murmur heard. Respiratory: Effort normal and breath sounds normal.  GI: Soft. Bowel sounds are normal. She exhibits no distension. There is no tenderness.  Genitourinary:  Genitourinary Comments: Deferred  Musculoskeletal: Normal range of motion. She exhibits no edema.  Lymphadenopathy:    She has no cervical adenopathy.  Neurological: She is alert and oriented to person, place, and time. No cranial nerve deficit. She exhibits normal muscle tone.  Skin: Skin is warm and dry. No rash noted. No erythema.  Psychiatric: She has a normal mood and affect. Her behavior is normal. Judgment and thought content normal.     Assessment/Plan This is an 83 year old female admitted for new onset atrial fibrillation. 1. A. fib: Rate controlled. I have started the patient on Plavix for now. Assess falls risk. 2. Dizziness: Acute on chronic; the patient was admitted to the hospital 2 weeks ago for this same problem. Presumably in part due to her meningioma. Meclizine for symptomatic relief 3. Hypokalemia: Replete potassium. Normal saline for improvement of hyponatremia. 4. Hypertension: Uncontrolled;  continue amlodipine. Labetalol as needed 5. COPD: Continue  Spiriva as well as inhaled corticosteroid. Albuterol as needed. 6. Constipation: Continue stool softener as well as mild laxative. 7. DVT prophylaxis: Lovenox 8. GI prophylaxis: None The patient is a full code. Time spent on admission orders and patient care approximately 45 minutes  Harrie Foreman, MD 06/15/2017, 7:16 AM

## 2017-06-15 NOTE — Progress Notes (Signed)
PT Cancellation Note  Patient Details Name: Janice Hoffman MRN: [id number] DOB: [birth year]   Cancelled Treatment:    Reason Eval/Treat Not Completed: Medical issues which prohibited therapy. Patient asking for help breathing, stating she can't get enough air. Paged RN, who provided nebulizers. O2 sats were 97-98% on 2L supplemental. PT will re-attempt at a later date/time.   Royce Macadamia PT, DPT, CSCS    06/15/2017, 2:37 PM

## 2017-06-15 NOTE — ED Notes (Signed)
Warm blankets and pillow provided to pt's family. Pt states she was dizzy while ambulating to toilet, but states dizziness improved after lying down in bed.

## 2017-06-15 NOTE — Care Management (Signed)
Patient with recent discharge home 9/23 after stay for near syncope.  a referral was made to [facility] for SN and PT. She presented back to ED for constipation and found to have new onset atrial fib. Cardiology has evaluated.  Rate controlled. Initiate Eliquis.  CM will provide 30 day trial couon. Chronic issues with dizziness

## 2017-06-15 NOTE — Progress Notes (Signed)
[place] [facility] [place] NAME: Janice Hoffman    MR#:  [id number]  DATE OF BIRTH:  1925/09/19  SUBJECTIVE:  CHIEF COMPLAINT:  Patient is reporting constipation and dizziness. Concerned about the lesion on the scalp which is not healing for 1 year, discussed with son, who is reporting that patient is persistently management relating the lesion which is the cause for nonhealing  REVIEW OF SYSTEMS:  CONSTITUTIONAL: No fever, fatigue or weakness.  EYES: No blurred or double vision.  EARS, NOSE, AND THROAT: No tinnitus or ear pain.  RESPIRATORY: No cough, shortness of breath, wheezing or hemoptysis.  CARDIOVASCULAR: No chest pain, orthopnea, edema.  GASTROINTESTINAL: No nausea, vomiting, diarrhea or abdominal pain. Reporting constipation GENITOURINARY: No dysuria, hematuria.  ENDOCRINE: No polyuria, nocturia,  HEMATOLOGY: No anemia, easy bruising or bleeding SKIN: No rash , lesion on the middle of the scalp, no discharge MUSCULOSKELETAL: No joint pain or arthritis.   NEUROLOGIC: No tingling, numbness, weakness.  PSYCHIATRY: No anxiety or depression.   DRUG ALLERGIES:   Allergies  Allergen Reactions  . Nsaids Shortness Of Breath    Throat swelling, shortness of breath  . Penicillins     Has patient had a PCN reaction causing immediate rash, facial/tongue/throat swelling, SOB or lightheadedness with hypotension: Unknown Has patient had a PCN reaction causing severe rash involving mucus membranes or skin necrosis: Unknown Has patient had a PCN reaction that required hospitalization: Unknown Has patient had a PCN reaction occurring within the last 10 years: Unknown If all of the above answers are "NO", then may proceed with Cephalosporin use.   . Promethazine Hcl     VITALS:  Blood pressure (!) 153/59, pulse 78, temperature 98.6 F (37 C), temperature source Oral, resp. rate 16, height 5\' 4"  (1.626 m), weight 51.2 kg (112 lb 12.8 oz), SpO2  99 %.  PHYSICAL EXAMINATION:  GENERAL:  83 y.o.-year-old patient lying in the bed with no acute distress.  EYES: Pupils equal, round, reactive to light and accommodation. No scleral icterus. Extraocular muscles intact.  HEENT: Head atraumatic, normocephalic. Oropharynx and nasopharynx clear.  NECK:  Supple, no jugular venous distention. No thyroid enlargement, no tenderness.  LUNGS: Normal breath sounds bilaterally, no wheezing, rales,rhonchi or crepitation. No use of accessory muscles of respiration.  CARDIOVASCULAR: Irregularly irregular. No murmurs, rubs, or gallops.  ABDOMEN: Soft, nontender, nondistended. Bowel sounds present. No organomegaly or mass.  EXTREMITIES: No pedal edema, cyanosis, or clubbing.  NEUROLOGIC: Cranial nerves II through XII are intact. Muscle strength 5/5 in all extremities. Sensation intact. Gait not checked.  PSYCHIATRIC: The patient is alert and oriented x 3.  SKIN: No obvious rash,  or ulcer. lesion on the middle of the scalp, no discharge  LABORATORY PANEL:   CBC  Recent Labs Lab 06/14/17 2331  WBC 8.7  HGB 13.1  HCT 38.1  PLT 201   ------------------------------------------------------------------------------------------------------------------  Chemistries   Recent Labs Lab 06/14/17 2331  NA 134*  K 3.4*  CL 100*  CO2 25  GLUCOSE 149*  BUN 14  CREATININE 0.65  CALCIUM 9.2  AST 22  ALT 16  ALKPHOS 35*  BILITOT 0.8   ------------------------------------------------------------------------------------------------------------------  Cardiac Enzymes  Recent Labs Lab 06/14/17 2331  TROPONINI <0.03   ------------------------------------------------------------------------------------------------------------------  RADIOLOGY:  Dg Abdomen Acute W/chest  Result Date: 06/14/2017 CLINICAL DATA:  No bowel movement for 2-3 days, constipation EXAM: DG ABDOMEN ACUTE W/ 1V CHEST COMPARISON:  Chest radiograph 05/31/2017 FINDINGS: Normal  heart size,  mediastinal contours, and pulmonary vascularity. Atherosclerotic calcification aorta. Emphysematous changes without infiltrate, pleural effusion, or pneumothorax. Skin folds project over lower lateral RIGHT chest. Diffuse osseous demineralization with pronounced thoracolumbar scoliosis. Nonobstructive bowel gas pattern with normal retained stool burden. No bowel dilatation, bowel wall thickening or free air. IMPRESSION: Emphysematous changes without acute infiltrate. Nonobstructive bowel gas pattern. Electronically Signed   By: Janice Hoffman M.D.   On: 06/14/2017 23:54    EKG:   Orders placed or performed during the hospital encounter of 06/14/17  . ED EKG  . ED EKG  . EKG 12-Lead  . EKG 12-Lead    ASSESSMENT AND PLAN:    1. A. fib: Rate controlled. Patient was seen by [facility] cardiology according to the son's request Patient is seen and evaluated by Dr. Ubaldo Glassing and patient is started on eliquis  2. Chronic Dizziness: Acute on chronic; the patient was admitted to the hospital 2 weeks ago for this same problem. Presumably in part due to her meningioma. Meclizine for symptomatic relief.  3. Hypokalemia: Replete potassium. Normal saline for improvement of hyponatremia.  4. Hypertension:  continue amlodipine. Labetalol as needed  5. Nonhealing lesion of the scalp from shingles in the past- recommended patient not to manipulate the lesion We started the patient on doxycycline, discontinue Bactrim Wound care consulted  6. Constipation: Continue stool softener as well as mild laxative.  7. DVT prophylaxis: d/c Lovenox, pt is started on eliquis  PT eval  All the records are reviewed and case discussed with Care Management/Social Workerr. Management plans discussed with the patient, SON Tessia Kassin over phone   and they are in agreement.  CODE STATUS: FC   TOTAL TIME TAKING CARE OF THIS PATIENT: 41 minutes.   POSSIBLE D/C IN 2 DAYS, DEPENDING ON CLINICAL CONDITION.  Note: This  dictation was prepared with Dragon dictation along with smaller phrase technology. Any transcriptional errors that result from this process are unintentional.   Janice Hoffman M.D on 06/15/2017 at 10:28 AM  Between 7am to 6pm - Pager - [phone] After 6pm go to [email or web address] - password [email or web address] [facility]  [facility]  Office  [phone]  CC: Primary care physician; Madelyn Brunner, MD

## 2017-06-16 LAB — BASIC METABOLIC PANEL
Anion gap: 7 (ref 5–15)
BUN: 12 mg/dL (ref 6–20)
CO2: 26 mmol/L (ref 22–32)
CREATININE: 0.42 mg/dL — AB (ref 0.44–1.00)
Calcium: 8.9 mg/dL (ref 8.9–10.3)
Chloride: 104 mmol/L (ref 101–111)
GFR calc Af Amer: >60 mL/min (ref >60)
GLUCOSE: 92 mg/dL (ref 65–99)
POTASSIUM: 3.9 mmol/L (ref 3.5–5.1)
Sodium: 137 mmol/L (ref 135–145)

## 2017-06-16 LAB — CBC
HEMATOCRIT: 36.6 % (ref 35.0–47.0)
Hemoglobin: 12.6 g/dL (ref 12.0–16.0)
MCH: 30.7 pg (ref 26.0–34.0)
MCHC: 34.4 g/dL (ref 32.0–36.0)
MCV: 89.2 fL (ref 80.0–100.0)
Platelets: 186 10*3/uL (ref 150–440)
RBC: 4.1 MIL/uL (ref 3.80–5.20)
RDW: 14 % (ref 11.5–14.5)
WBC: 4.9 10*3/uL (ref 3.6–11.0)

## 2017-06-16 MED ORDER — SODIUM CHLORIDE 0.9% FLUSH: 3.0000 mL | Freq: Two times a day (BID) | INTRAVENOUS | Status: DC

## 2017-06-16 MED ORDER — MUPIROCIN 2 % EX OINT
TOPICAL_OINTMENT | Freq: Two times a day (BID) | CUTANEOUS | Status: DC
  Administered 2017-06-16: 13:00:00 via TOPICAL
  Filled 2017-06-16: qty 22

## 2017-06-16 MED ORDER — SODIUM CHLORIDE 0.9% FLUSH
3.0000 mL | Freq: Two times a day (BID) | INTRAVENOUS | Status: DC
  Administered 2017-06-16: 3 mL via INTRAVENOUS

## 2017-06-16 NOTE — Care Management CC44 (Signed)
Condition Code 44 Documentation Completed  Patient Details  Name: Janice Hoffman MRN: [id number] Date of Birth: [birth year]   Condition Code 44 given:  Yes Patient signature on Condition Code 44 notice:  Yes Documentation of 2 MD's agreement:  Yes Code 44 added to claim:  Yes    Katrina Stack, RN 06/16/2017, 6:59 PM

## 2017-06-16 NOTE — Care Management (Signed)
Patient's verbally confirmed that patient has medicare pharmacy coverage.  Provided patient with coupon for 30 day trial of Eliquis.

## 2017-06-16 NOTE — Care Management (Signed)
Notified of Medicare code 98.  Patient is not able to understand explanation and asked that it be discussed with her son.  Left voicemail  message for son.  Unable to complete the process.

## 2017-06-16 NOTE — Progress Notes (Addendum)
[facility] NAME: Janice Hoffman    MR#:  [id number]  DATE OF BIRTH:  25-Apr-1926  SUBJECTIVE:  CHIEF COMPLAINT:  Patient is reporting being  Dizzy always. Concerned about the lesion on the scalp which is not healing for 1 year, discussed with son  REVIEW OF SYSTEMS:  CONSTITUTIONAL: No fever, fatigue or weakness.  EYES: No blurred or double vision.  EARS, NOSE, AND THROAT: No tinnitus or ear pain.  RESPIRATORY: No cough, shortness of breath, wheezing or hemoptysis.  CARDIOVASCULAR: No chest pain, orthopnea, edema.  GASTROINTESTINAL: No nausea, vomiting, diarrhea or abdominal pain. Reporting constipation GENITOURINARY: No dysuria, hematuria.  ENDOCRINE: No polyuria, nocturia,  HEMATOLOGY: No anemia, easy bruising or bleeding SKIN: No rash , lesion on the middle of the scalp, no discharge MUSCULOSKELETAL: No joint pain or arthritis.   NEUROLOGIC: No tingling, numbness, weakness.  PSYCHIATRY: No anxiety or depression.   DRUG ALLERGIES:   Allergies  Allergen Reactions  . Nsaids Shortness Of Breath    Throat swelling, shortness of breath  . Penicillins     Has patient had a PCN reaction causing immediate rash, facial/tongue/throat swelling, SOB or lightheadedness with hypotension: Unknown Has patient had a PCN reaction causing severe rash involving mucus membranes or skin necrosis: Unknown Has patient had a PCN reaction that required hospitalization: Unknown Has patient had a PCN reaction occurring within the last 10 years: Unknown If all of the above answers are "NO", then may proceed with Cephalosporin use.   . Promethazine Hcl     VITALS:  Blood pressure 140/66, pulse 82, temperature 97.9 F (36.6 C), temperature source Oral, resp. rate 20, height 5\' 4"  (1.626 m), weight 50.3 kg (110 lb 14.4 oz), SpO2 100 %.  PHYSICAL EXAMINATION:  GENERAL:  83 y.o.-year-old patient lying in the bed with no acute distress.  EYES:  Pupils equal, round, reactive to light and accommodation. No scleral icterus. Extraocular muscles intact.  HEENT: Head atraumatic, normocephalic. Oropharynx and nasopharynx clear.  NECK:  Supple, no jugular venous distention. No thyroid enlargement, no tenderness.  LUNGS: Normal breath sounds bilaterally, no wheezing, rales,rhonchi or crepitation. No use of accessory muscles of respiration.  CARDIOVASCULAR: Irregularly irregular. No murmurs, rubs, or gallops.  ABDOMEN: Soft, nontender, nondistended. Bowel sounds present. No organomegaly or mass.  EXTREMITIES: No pedal edema, cyanosis, or clubbing.  NEUROLOGIC: Cranial nerves II through XII are intact. Muscle strength 5/5 in all extremities. Sensation intact. Gait not checked.  PSYCHIATRIC: The patient is alert and oriented x 3.  SKIN: No obvious rash,  or ulcer. lesion on the middle of the scalp, no discharge  LABORATORY PANEL:   CBC  Recent Labs Lab 06/16/17 0639  WBC 4.9  HGB 12.6  HCT 36.6  PLT 186   ------------------------------------------------------------------------------------------------------------------  Chemistries   Recent Labs Lab 06/14/17 2331 06/16/17 0639  NA 134* 137  K 3.4* 3.9  CL 100* 104  CO2 25 26  GLUCOSE 149* 92  BUN 14 12  CREATININE 0.65 0.42*  CALCIUM 9.2 8.9  AST 22  --   ALT 16  --   ALKPHOS 35*  --   BILITOT 0.8  --    ------------------------------------------------------------------------------------------------------------------  Cardiac Enzymes  Recent Labs Lab 06/14/17 2331  TROPONINI <0.03   ------------------------------------------------------------------------------------------------------------------  RADIOLOGY:  Dg Abdomen Acute W/chest  Result Date: 06/14/2017 CLINICAL DATA:  No bowel movement for 2-3 days, constipation EXAM: DG ABDOMEN ACUTE W/ 1V CHEST COMPARISON:  Chest radiograph 05/31/2017  FINDINGS: Normal heart size, mediastinal contours, and pulmonary  vascularity. Atherosclerotic calcification aorta. Emphysematous changes without infiltrate, pleural effusion, or pneumothorax. Skin folds project over lower lateral RIGHT chest. Diffuse osseous demineralization with pronounced thoracolumbar scoliosis. Nonobstructive bowel gas pattern with normal retained stool burden. No bowel dilatation, bowel wall thickening or free air. IMPRESSION: Emphysematous changes without acute infiltrate. Nonobstructive bowel gas pattern. Electronically Signed   By: Lavonia Dana M.D.   On: 06/14/2017 23:54    EKG:   Orders placed or performed during the hospital encounter of 06/14/17  . ED EKG  . ED EKG  . EKG 12-Lead  . EKG 12-Lead    ASSESSMENT AND PLAN:    1. A. fib: Rate controlled. [facility] cardiology is following.Patient is seen and evaluated by Dr. Ubaldo Glassing and patient is started on eliquis Will need ambulation with physical therapy assistance to guide discharge disposition.   2. Chronic Dizziness: Acute on chronic; the patient was admitted to the hospital 2 weeks ago for this same problem. Presumably in part due to her meningioma. Meclizine for symptomatic relief.  3. Hypokalemia: Repleted potassium.   4. Hypertension:  continue amlodipine. Labetalol as needed  5. Nonhealing lesion of the scalp from shingles in the past- recommended patient not to manipulate the lesion We started the patient on doxycycline, discontinue Bactrim Wound care consulted  6. Constipation: Continue stool softener as well as mild laxative.  7. DVT prophylaxis: d/c Lovenox, pt is started on eliquis  PT eval- Will need ambulation with physical therapy assistance to guide discharge disposition as pt is chronically dizzy and  Started on Eliquis  All the records are reviewed and case discussed with Care Management/Social Workerr. Management plans discussed with the patient, SON Inika Bellanger  -[phone]   and they are in agreement.  CODE STATUS: FC   TOTAL TIME TAKING CARE OF THIS  PATIENT:35 minutes.   POSSIBLE D/C IN 2 DAYS, DEPENDING ON CLINICAL CONDITION.  Note: This dictation was prepared with Dragon dictation along with smaller phrase technology. Any transcriptional errors that result from this process are unintentional.   [name] M.D on 06/16/2017 at 2:04 PM  Between 7am to 6pm - Pager - [phone] After 6pm go to [email or web address] - password [email or web address] [facility]  [place] [facility]  Office  [phone]  CC: Primary care physician; Madelyn Brunner, MD

## 2017-06-16 NOTE — Care Management Obs Status (Addendum)
[place] NOTIFICATION   Patient Details  Name: Janice Hoffman MRN: [id number] Date of Birth: Nov 17, 1925   Medicare Observation Status Notification Given:  Yes Patient could not comprehend.  Explained it over the phone to patient's son [name]- left notice in patient's room for son to sign should he chose on 06/17/2017.  Placed a copy in the medical record   10.9.2018 - was informed that patient's son presented to unit last evening.  did not sign code 44 notice.  Patient's daughter in law declines to sing. Reinforced that not signing the notice does not change how the stay is billed  Katrina Stack, RN 06/16/2017, 6:56 PM

## 2017-06-16 NOTE — Care Management (Signed)
Patient's son returned CM call.  He relays that he is a physician. CM explained the Code 44 notice and the UR process.  He asks why he was not informed sooner. He demands that his mother not receive anymore medications while she is here and CM reviewed that medications administered that are not covered could be filed under patients part D plan.   He states several times for his mother not to receive anymore medication and CM discussed that would not be in the best interest of the patient. He then says that he wants his mother "ready to leave"  in the morning before she gets any meds.  Informed son that CM would pass this information along. CM briefly reviewed presenting sx- chief complaint and chronic issues with dizziness. CM discussed that rate controled atrial fib is treated as outpatient- and meets observation. CM informed that patient's son wishes for patient to return home- even though physical therapy has recommended skilled nursing placement. CM left the notice in the room for Grant Fontana to sign.

## 2017-06-16 NOTE — Progress Notes (Signed)
       Janice Hoffman  SUBJECTIVE: still somewhat dizzy but improved.    Vitals:   06/15/17 1248 06/15/17 1942 06/16/17 0337 06/16/17 0737  BP: (!) 147/65 126/62 (!) 148/75 140/66  Pulse: 78 82 82 82  Resp: 16 18 18 20   Temp: 97.9 F (36.6 C) 98.4 F (36.9 C) 98.1 F (36.7 C) 97.9 F (36.6 C)  TempSrc: Oral Oral Oral Oral  SpO2: 100% 99% 98% 100%  Weight:   50.3 kg (110 lb 14.4 oz)   Height:        Intake/Output Summary (Last 24 hours) at 06/16/17 1339 Last data filed at 06/16/17 0951  Gross per 24 hour  Intake              360 ml  Output             1200 ml  Net             -840 ml    LABS: Basic Metabolic Panel:  Recent Labs  06/14/17 2331 06/16/17 0639  NA 134* 137  K 3.4* 3.9  CL 100* 104  CO2 25 26  GLUCOSE 149* 92  BUN 14 12  CREATININE 0.65 0.42*  CALCIUM 9.2 8.9   Liver Function Tests:  Recent Labs  06/14/17 2331  AST 22  ALT 16  ALKPHOS 35*  BILITOT 0.8  PROT 6.8  ALBUMIN 4.0    Recent Labs  06/14/17 2331  LIPASE 27   CBC:  Recent Labs  06/14/17 2331 06/16/17 0639  WBC 8.7 4.9  NEUTROABS 6.4  --   HGB 13.1 12.6  HCT 38.1 36.6  MCV 88.7 89.2  PLT 201 186   Cardiac Enzymes:  Recent Labs  06/14/17 2331  TROPONINI <0.03   BNP: Invalid input(s): POCBNP D-Dimer: No results for input(s): DDIMER in the last 72 hours. Hemoglobin A1C: No results for input(s): HGBA1C in the last 72 hours. Fasting Lipid Panel: No results for input(s): CHOL, HDL, LDLCALC, TRIG, CHOLHDL, LDLDIRECT in the last 72 hours. Thyroid Function Tests:  Recent Labs  06/14/17 2331  TSH 0.289*   Anemia Panel: No results for input(s): VITAMINB12, FOLATE, FERRITIN, TIBC, IRON, RETICCTPCT in the last 72 hours.   Physical Exam: Blood pressure 140/66, pulse 82, temperature 97.9 F (36.6 C), temperature source Oral, resp. rate 20, height 5\' 4"  (1.626 m), weight 50.3 kg (110 lb 14.4 oz), SpO2 100 %.   Wt  Readings from Last 1 Encounters:  06/16/17 50.3 kg (110 lb 14.4 oz)     General appearance: alert and cooperative Resp: clear to auscultation bilaterally Cardio: irregularly irregular rhythm GI: soft, non-tender; bowel sounds normal; no masses,  no organomegaly Extremities: extremities normal, atraumatic, no cyanosis or edema Neurologic: Grossly normal  TELEMETRY: Reviewed telemetry pt in afib with variable but controlled vr:  ASSESSMENT AND PLAN:  Active Problems:   New onset a-fib (HCC)-rate appears controlled. Have started on eliquis 2.5 mg bid. Will contirnue with this protocol. Dizzyness may be do in part to afib. Also has veritginous symptoms. Will need ambulation with physical therapy assistance to guide discharge disposition.     Teodoro Spray, MD, [facility] 06/16/2017 1:39 PM

## 2017-06-16 NOTE — Progress Notes (Signed)
Pt refused Bipap.

## 2017-06-16 NOTE — Consult Note (Signed)
[facility] Nurse wound consult note Reason for Consult:Herpes zoster lesion to top of head.  Chronic, nonhealing.  Wound type: inflammatory, shingles Pressure Injury POA: NA Measurement: 1 cm x 0.4 cm x 0.2 cm  Wound YTW:KMQKM red Drainage (amount, consistency, odor) scant serous weeping Periwound:intact.  Hair makes a dressing prohibitive Dressing procedure/placement/frequency:Cleanse lesion to top of her head with NS.  Apply Pea sized amount of mupirocin ointment. Twice daily.  Will not follow at this time.  Please re-consult if needed.  Domenic Moras RN BSN [place] Pager [phone]

## 2017-06-16 NOTE — Progress Notes (Signed)
Physical Therapy Evaluation Patient Details Name: Janice Hoffman MRN: [id number] DOB: [birth year] Today's Date: 06/16/2017   History of Present Illness  Pt admitted for new onset a-fib, being treated with Eliquis. Pt was admitted two weeks ago with complaints of dizziness, MRI showing meningioma, on Meclizine for dizziness. PMH of COPD, post-herpetic neuralgia, HTN, scab on top of head.   Clinical Impression  Pt is a pleasant 83 year old female admitted for new onset a-fib. Pt performed supine there-ex, bed mobility with CGA, transfers with RW with CGA, and amb with RW with min guard. Pt amb a total of 2 ft, from EOB to recliner, with slow small steps, able to maintain control of RW, however appeared unable to amb further due to dizziness and fatigue. Pt reported feeling dizzy throughout session, O2 sats >90%, HR 76-81 bpm throughout session for all mobility activities. Pt does not appear safe to return home due to increased falls risk with all mobility activities due to dizziness. Pt demonstrates deficits with strength, endurance, transfers and amb. Pt would benefit from skilled PT to address above deficits and promote optimal return to home. Recommend transition to SNF upon DC from acute hospitalization.     Follow Up Recommendations SNF    Equipment Recommendations  None recommended by PT    Recommendations for Other Services       Precautions / Restrictions Precautions Precautions: Fall Restrictions Weight Bearing Restrictions: No      Mobility  Bed Mobility Overal bed mobility: Needs Assistance Bed Mobility: Supine to Sit     Supine to sit: Min guard     General bed mobility comments: supine to sit at EOB, utilized bed rail, no cues, CGA for safety as pt reported feeling weak.  Transfers Overall transfer level: Needs assistance Equipment used: Rolling walker (2 wheeled) Transfers: Sit to/from Stand Sit to Stand: Min guard         General transfer comment: sit EOB to  standing with RW, cues for proper technique, CGA for safety as pt reported feeling dizzy, appeared slightly unsteady on feet  Ambulation/Gait Ambulation/Gait assistance: Min guard Ambulation Distance (Feet): 2 Feet Assistive device: Rolling walker (2 wheeled) Gait Pattern/deviations: Step-to pattern     General Gait Details: Pt amb with step-to gait pattern, able to navigate and maintain control of RW to turn, amb with short steps. Pt reported feeling dizzy throughout, appeared unsteady on feet.   Stairs            Wheelchair Mobility    Modified Rankin (Stroke Patients Only)       Balance Overall balance assessment: Needs assistance Sitting-balance support: Feet supported Sitting balance-Leahy Scale: Good Sitting balance - Comments: Pt able to sit at EOB with CGA for safety, able to maintain position to measure O2 sats. No LOB. Pt sat at EOB for several min to breath.    Standing balance support: Bilateral upper extremity supported Standing balance-Leahy Scale: Good Standing balance comment: Pt able to stand at EOB, appeared to use RW for support. Able to maintain position to measure O2 sats. Reported feeling dizzy.                              Pertinent Vitals/Pain Pain Assessment: 0-10 Pain Score: 0-No pain    Home Living Family/patient expects to be discharged to:: Private residence Living Arrangements: Alone Available Help at Discharge: Family Type of Home: House Home Access: Stairs to enter Entrance Stairs-Rails:  Left;Right Entrance Stairs-Number of Steps: 5 Home Layout: One level Home Equipment: Walker - 2 wheels;Cane - single point Additional Comments: Pt lives alone and able to perform ADLS however son is available for help.     Prior Function Level of Independence: Independent with assistive device(s)         Comments: Pt reports able to perform all ADLs independently with [facility] for amb within home and RW for amb outside home.       Hand Dominance        Extremity/Trunk Assessment   Upper Extremity Assessment Upper Extremity Assessment: Generalized weakness (UE MMT grossly 3+/5)    Lower Extremity Assessment Lower Extremity Assessment: Generalized weakness (LE MMT grossly 4/5)       Communication   Communication: HOH  Cognition Arousal/Alertness: Awake/alert Behavior During Therapy: WFL for tasks assessed/performed Overall Cognitive Status: Within Functional Limits for tasks assessed                                 General Comments: Pt appeared slightly confused      General Comments      Exercises Other Exercises Other Exercises: Supine ther-ex, 10x, both sides, ankle pumps, hip abd/add, SLRs, cues for proper technique. No assist needed.    Assessment/Plan    PT Assessment Patient needs continued PT services  PT Problem List Decreased strength;Decreased activity tolerance;Decreased balance;Decreased mobility;Decreased knowledge of use of DME       PT Treatment Interventions DME instruction;Gait training;Stair training;Functional mobility training;Therapeutic activities;Therapeutic exercise;Balance training;Patient/family education    PT Goals (Current goals can be found in the Care Plan section)  Acute Rehab PT Goals Patient Stated Goal: to feel better PT Goal Formulation: With patient Time For Goal Achievement: 06/30/17 Potential to Achieve Goals: Good    Frequency Min 2X/week   Barriers to discharge        Co-evaluation               AM-PAC PT "6 Clicks" Daily Activity  Outcome Measure Difficulty turning over in bed (including adjusting bedclothes, sheets and blankets)?: Unable Difficulty moving from lying on back to sitting on the side of the bed? : Unable Difficulty sitting down on and standing up from a chair with arms (e.g., wheelchair, bedside commode, etc,.)?: Unable Help needed moving to and from a bed to chair (including a wheelchair)?: A  Little Help needed walking in hospital room?: A Lot Help needed climbing 3-5 steps with a railing? : A Little 6 Click Score: 11    End of Session Equipment Utilized During Treatment: Gait belt Activity Tolerance: Patient tolerated treatment well (limited by dizziness) Patient left: in chair;with call bell/phone within reach;with chair alarm set Nurse Communication: Mobility status PT Visit Diagnosis: Other abnormalities of gait and mobility (R26.89);Unsteadiness on feet (R26.81);Muscle weakness (generalized) (M62.81)    Time: 3016-0109 PT Time Calculation (min) (ACUTE ONLY): 23 min   Charges:         PT G Codes:       Manfred Arch, SPT  Manfred Arch 06/16/2017, 12:39 PM

## 2017-06-17 LAB — CBC
HEMATOCRIT: 35.9 % (ref 35.0–47.0)
HEMOGLOBIN: 12 g/dL (ref 12.0–16.0)
MCH: 29.8 pg (ref 26.0–34.0)
MCHC: 33.5 g/dL (ref 32.0–36.0)
MCV: 89 fL (ref 80.0–100.0)
Platelets: 195 10*3/uL (ref 150–440)
RBC: 4.04 MIL/uL (ref 3.80–5.20)
RDW: 14 % (ref 11.5–14.5)
WBC: 4.1 10*3/uL (ref 3.6–11.0)

## 2017-06-17 LAB — BASIC METABOLIC PANEL
Anion gap: 5 (ref 5–15)
BUN: 17 mg/dL (ref 6–20)
CALCIUM: 8.8 mg/dL — AB (ref 8.9–10.3)
CHLORIDE: 103 mmol/L (ref 101–111)
CO2: 28 mmol/L (ref 22–32)
CREATININE: 0.63 mg/dL (ref 0.44–1.00)
GFR calc non Af Amer: >60 mL/min (ref >60)
Glucose, Bld: 89 mg/dL (ref 65–99)
Potassium: 3.7 mmol/L (ref 3.5–5.1)
Sodium: 136 mmol/L (ref 135–145)

## 2017-06-17 MED ORDER — MUPIROCIN 2 % EX OINT: TOPICAL_OINTMENT | Freq: Two times a day (BID) | CUTANEOUS | 0 refills | Status: DC

## 2017-06-17 MED ORDER — APIXABAN 2.5 MG PO TABS: 2.5000 mg | ORAL_TABLET | Freq: Two times a day (BID) | ORAL | 0 refills | Status: DC

## 2017-06-17 MED ORDER — DOXYCYCLINE HYCLATE 100 MG PO TABS: 100.0000 mg | ORAL_TABLET | Freq: Two times a day (BID) | ORAL | 0 refills | Status: DC

## 2017-06-17 MED ORDER — DOCUSATE SODIUM 100 MG PO CAPS: 100.0000 mg | ORAL_CAPSULE | Freq: Two times a day (BID) | ORAL | 0 refills | Status: DC | PRN

## 2017-06-17 NOTE — Progress Notes (Signed)
06/16/17 1200  PT Visit Information  Last PT Received On 06/16/17  Assistance Needed +1  History of Present Illness Pt admitted for new onset a-fib, being treated with Eliquis. Pt was admitted two weeks ago with complaints of dizziness, MRI showing meningioma, on Meclizine for dizziness. PMH of COPD, post-herpetic neuralgia, HTN, scab on top of head.   Precautions  Precautions Fall  Restrictions  Weight Bearing Restrictions No  Home Living  Family/patient expects to be discharged to: Private residence  Living Arrangements Alone  Available Help at Discharge Family  Type of [place] to enter  Entrance Stairs-Number of Steps [place] One level  [place] - 2 wheels;Cane - single point  Additional Comments Pt lives alone and able to perform ADLS however son is available for help.   Prior Function  Level of Independence Independent with assistive device(s)  Comments Pt reports able to perform all ADLs independently with [facility] for amb within home and RW for amb outside home.   Communication  Communication HOH  Pain Assessment  Pain Assessment 0-10  Pain Score 0  Cognition  Arousal/Alertness Awake/alert  Behavior During Therapy WFL for tasks assessed/performed  Overall Cognitive Status Within Functional Limits for tasks assessed  General Comments Pt appeared slightly confused  Upper Extremity Assessment  Upper Extremity Assessment Generalized weakness (UE MMT grossly 3+/5)  Lower Extremity Assessment  Lower Extremity Assessment Generalized weakness (LE MMT grossly 4/5)  Bed Mobility  Overal bed mobility Needs Assistance  Bed Mobility Supine to Sit  Supine to sit Min guard  General bed mobility comments supine to sit at EOB, utilized bed rail, no cues, CGA for safety as pt reported feeling weak.  Transfers  Overall transfer level Needs assistance  Equipment used Rolling walker (2 wheeled)  Transfers Sit  to/from Stand  Sit to Stand Min guard  General transfer comment sit EOB to standing with RW, cues for proper technique, CGA for safety as pt reported feeling dizzy and weak, no LOB  Ambulation/Gait  Ambulation/Gait assistance Min guard  Ambulation Distance (Feet) 2 Feet  Assistive device Rolling walker (2 wheeled)  Gait Pattern/deviations Step-to pattern  General Gait Details Pt amb with step-to gait pattern, able to navigate and maintain control of RW to turn, amb with short steps. Pt reported feeling dizzy throughout, appeared unsteady on feet.   Balance  Overall balance assessment Needs assistance  Sitting-balance support Feet supported  Sitting balance-Leahy Scale Good  Sitting balance - Comments Pt able to sit at EOB with CGA for safety, able to maintain position to measure O2 sats. No LOB. Pt sat at EOB for several min to breath.   Standing balance support Bilateral upper extremity supported  Standing balance-Leahy Scale Good  Standing balance comment Pt able to stand at EOB, appeared to use RW for support. Able to maintain position to measure O2 sats. Reported feeling dizzy.   Exercises  Exercises Other exercises  Other Exercises  Other Exercises Supine ther-ex, 10x, both sides, ankle pumps, hip abd/add, SLRs, cues for proper technique. No assist needed.   PT - End of Session  Equipment Utilized During Treatment Gait belt  Activity Tolerance Patient tolerated treatment well (limited by dizziness)  Patient left in chair;with call bell/phone within reach;with chair alarm set  Nurse Communication Mobility status  PT Assessment  PT Recommendation/Assessment Patient needs continued PT services  PT Visit Diagnosis Other abnormalities of gait and mobility (R26.89);Unsteadiness on feet (  R26.81);Muscle weakness (generalized) (M62.81)  PT Problem List Decreased strength;Decreased activity tolerance;Decreased balance;Decreased mobility;Decreased knowledge of use of DME  PT Plan  PT  Frequency (ACUTE ONLY) Min 2X/week  PT Treatment/Interventions (ACUTE ONLY) DME instruction;Gait training;Stair training;Functional mobility training;Therapeutic activities;Therapeutic exercise;Balance training;Patient/family education  AM-PAC PT "6 Clicks" Daily Activity Outcome Measure  Difficulty turning over in bed (including adjusting bedclothes, sheets and blankets)? 1  Difficulty moving from lying on back to sitting on the side of the bed?  1  Difficulty sitting down on and standing up from a chair with arms (e.g., wheelchair, bedside commode, etc,.)? 1  Help needed moving to and from a bed to chair (including a wheelchair)? 3  Help needed walking in hospital room? 2  Help needed climbing 3-5 steps with a railing?  3  6 Click Score 11  Mobility G Code  CL  PT Recommendation  Follow Up Recommendations SNF  PT equipment None recommended by PT  Individuals Consulted  Consulted and Agree with Results and Recommendations Patient  Acute Rehab PT Goals  Patient Stated Goal to feel better  PT Goal Formulation With patient  Time For Goal Achievement 06/30/17  Potential to Achieve Goals Good  PT Time Calculation  PT Start Time (ACUTE ONLY) 1011  PT Stop Time (ACUTE ONLY) 1034  PT Time Calculation (min) (ACUTE ONLY) 23 min  PT G-Codes **NOT FOR INPATIENT CLASS**  Functional Assessment Tool Used AM-PAC 6 Clicks Basic Mobility  Functional Limitation Mobility: Walking and moving around  Mobility: Walking and Moving Around Current Status (V6160) CL  Mobility: Walking and Moving Around Goal Status (V3710) CK  PT General Charges  $$ ACUTE PT VISIT 1 Visit  PT Evaluation  $PT Eval Low Complexity 1 Low  PT Treatments  $Therapeutic Exercise 8-22 mins  Late entry G codes  Greggory Stallion, PT, DPT [phone]

## 2017-06-17 NOTE — Discharge Instructions (Signed)
Follow-up with primary care physician in a week Follow-up with cardiology Dr. Ubaldo Glassing in a week Continue home health PT Continue ambulating with walker as recommended by physical therapy

## 2017-06-17 NOTE — Clinical Social Work Note (Signed)
CSW received referral for SNF.  Case discussed with case manager and plan is to discharge home with home health.  CSW to sign off please re-consult if social work needs arise.  Margery Szostak R. Emilya Justen, MSW, LCSWA [phone]  

## 2017-06-17 NOTE — Care Management (Signed)
CM spoke with patient's daughter in law regarding the code 75 notice. Patient's son is not able to be on the unit as he is at work seeing patients.  CM re explained the notice, medicare guidelines and routine home medications- which seems to be an area of concern. CM ended the conversation emphasizing that visit status is appropriate per guidelines established by medicare.  have paged attending to expedite patient's discharge per daughter in [place] request.  Provided the Eliquis coupon and explanation of how to use.

## 2017-06-17 NOTE — Progress Notes (Signed)
Physical Therapy Treatment Patient Details Name: Janice Hoffman MRN: [id number] DOB: 1925/10/23 Today's Date: 06/17/2017    History of Present Illness Pt admitted for new onset a-fib, being treated with Eliquis. Pt was admitted two weeks ago with complaints of dizziness, MRI showing meningioma, on Meclizine for dizziness. PMH of COPD, post-herpetic neuralgia, HTN, scab on top of head.     PT Comments    Pt is making good progress towards goals. Pt performed bed mobility with supervision, transfers with RW with supervision, and amb with RW with CGA. Pt amb a total of 160 ft, amb from recliner to nurse's station and back to recliner, rested for several minutes, and able to repeat same distance. Pt appeared safe to amb with RW, no reported dizziness, LOB or SOB, pt demonstrates decreased falls risk. Pt able to perform bed mobility and transfers with proper technique, no cues needed. Pt reported pain in L knee which did not appear to interfere with mobility. Pt education with pt and daughter-in-law about importance of RW use for transfers/amb, discussion about current home set-up and family assistance available to patient. Change of recommendation from SNF to [facility].    Follow Up Recommendations  Home health PT     Equipment Recommendations  None recommended by PT    Recommendations for Other Services       Precautions / Restrictions Precautions Precautions: Fall Restrictions Weight Bearing Restrictions: No    Mobility  Bed Mobility Overal bed mobility: Modified Independent Bed Mobility: Supine to Sit     Supine to sit: Modified independent (Device/Increase time)     General bed mobility comments: Pt able to rise supine to sit at EOB, utilized bed rail. No cues needed. Pt able to reach back behind her to grab comb from bedside table with no assist. Pt report's slight dizziness.   Transfers Overall transfer level: Needs assistance Equipment used: Rolling walker (2  wheeled) Transfers: Sit to/from Stand Sit to Stand: Supervision         General transfer comment: Pt able to rise from seated EOB to standing with RW with proper technique, no cues needed. Pt reported pain in L knee, no dizziness.   Ambulation/Gait Ambulation/Gait assistance: Min guard Ambulation Distance (Feet): 160 Feet Assistive device: Rolling walker (2 wheeled) Gait Pattern/deviations: Step-through pattern     General Gait Details: Pt amb with alternating step-through gait pattern, able to maintain control of RW. Pt amb at a steady pace. Pt reported pain in L knee, decreased dizziness, no LOB or SOB with amb. Pt asked to amb without RW, however pt appeared to use RW for extra support, encouraged use of RW. Pt amb from recliner to nurse's station and back to recliner x 2.    Stairs            Wheelchair Mobility    Modified Rankin (Stroke Patients Only)       Balance Overall balance assessment: Needs assistance Sitting-balance support: Feet supported Sitting balance-Leahy Scale: Good Sitting balance - Comments: Pt able to sit at EOB with supervision. Able to maintain position. No reported dizziness.    Standing balance support: Bilateral upper extremity supported Standing balance-Leahy Scale: Good Standing balance comment: Pt able to stand at EOB with RW with supervision. No reported dizziness or LOB. Able to maintain position.                             Cognition Arousal/Alertness: Awake/alert Behavior During  Therapy: WFL for tasks assessed/performed Overall Cognitive Status: Within Functional Limits for tasks assessed                                        Exercises      General Comments        Pertinent Vitals/Pain Pain Assessment: Faces Pain Score: 4  (Pt unable to verbally quantify a number) Faces Pain Scale: Hurts little more Pain Location: L knee pain (chronic) with amb Pain Intervention(s): Limited activity within  patient's tolerance;Monitored during session;Repositioned    Home Living                      Prior Function            PT Goals (current goals can now be found in the care plan section) Acute Rehab PT Goals Patient Stated Goal: to feel better PT Goal Formulation: With patient/family Time For Goal Achievement: 06/30/17 Potential to Achieve Goals: Good Progress towards PT goals: Progressing toward goals    Frequency    Min 2X/week      PT Plan Discharge plan needs to be updated    Co-evaluation              AM-PAC PT "6 Clicks" Daily Activity  Outcome Measure  Difficulty turning over in bed (including adjusting bedclothes, sheets and blankets)?: None Difficulty moving from lying on back to sitting on the side of the bed? : None Difficulty sitting down on and standing up from a chair with arms (e.g., wheelchair, bedside commode, etc,.)?: None Help needed moving to and from a bed to chair (including a wheelchair)?: None Help needed walking in hospital room?: A Little Help needed climbing 3-5 steps with a railing? : A Little 6 Click Score: 22    End of Session Equipment Utilized During Treatment: Gait belt Activity Tolerance: Patient tolerated treatment well Patient left: in chair;with call bell/phone within reach;with chair alarm set Nurse Communication: Mobility status PT Visit Diagnosis: Other abnormalities of gait and mobility (R26.89);Pain Pain - Right/Left: Left Pain - part of body: Knee     Time: 0925-0958 PT Time Calculation (min) (ACUTE ONLY): 33 min  Charges:                       G Codes:       Manfred Arch, SPT   Manfred Arch 06/17/2017, 11:12 AM

## 2017-06-17 NOTE — Discharge Summary (Signed)
[facility] [place] NAME: Janice Hoffman    MR#:  [id number]  DATE OF BIRTH:  April 06, 1926  DATE OF ADMISSION:  06/14/2017 ADMITTING PHYSICIAN: Harrie Foreman, MD  DATE OF DISCHARGE: 06/17/17  PRIMARY CARE PHYSICIAN: Madelyn Brunner, MD    ADMISSION DIAGNOSIS:  Dizziness [R42] Constipation, unspecified constipation type [K59.00] Open wound of scalp, unspecified open wound type, initial encounter [S01.00XA]  DISCHARGE DIAGNOSIS:  Active Problems:   New onset a-fib ([place]) Acute on chronic dizziness  SECONDARY DIAGNOSIS:   Past Medical History:  Diagnosis Date  . Asthma   . COPD (chronic obstructive pulmonary disease) ([place])   . Herpes zoster   . Hypertension   . Neuralgia     HOSPITAL COURSE:  HPI: The patient with past medical history of COPD, hypertension and postherpetic neuralgia presents emergency department complaining of constipation. The patient states that she's been unable to move her bowels and at least 3 days. She has mild abdominal pain but is also concerned about a sore on the top of her head as well as dizziness. The latter is chronic. She was admitted to the hospital 2 weeks ago for the same and found to have a meningioma. She denies visual symptoms as well as nausea or vomiting. She also complains of soreness in her joints and back. Routine EKG evaluation revealed atrial fibrillation. This is new onset. She denies chest pain, shortness of breath or diaphoresis. In light of her dizziness in addition to new arrhythmia the emergency department staff called the hospitalist service for admission.  1. A. fib: Rate controlled. [facility] cardiology is following.Patient is seen and evaluated by Dr. Ubaldo Glassing and patient is started on eliquis Converted to sinus rhythm. Follow-up with cardiology in 1 week  2. Acute on  Chronic Dizziness: From new onset atrial fibrillation  Significantly improved today the patient was admitted to the  hospital 2 weeks ago for this same problem. Presumably in part due to her meningioma. Meclizine for symptomatic relief.  3. Hypokalemia: Repleted potassium.   4. Hypertension:  continue amlodipine. Labetalol as needed  5. Nonhealing lesion of the scalp from shingles in the past- recommended patient not to manipulate the lesion We started the patient on doxycycline, discontinue Bactrim Wound care has seen the patient. Patient was recommended not to manipulate  the lesion  6. Constipation: Patient had good bowel movement Continue stool softener as well as mild laxative.  7. DVT prophylaxis: d/c Lovenox, pt is started on eliquis  Physical therapy has evaluated the patient has recommended skilled nursing facility but patient and her family wants to take her home. We'll arrange home health PT. As per my discussion with physical therapy patient's fall risk will be minimal if she uses walker while ambulating as per PT recommendations  DISCHARGE CONDITIONS:   Fair  CONSULTS OBTAINED:  Treatment Team:  Teodoro Spray, MD   PROCEDURES none   DRUG ALLERGIES:   Allergies  Allergen Reactions  . Nsaids Shortness Of Breath    Throat swelling, shortness of breath  . Penicillins     Has patient had a PCN reaction causing immediate rash, facial/tongue/throat swelling, SOB or lightheadedness with hypotension: Unknown Has patient had a PCN reaction causing severe rash involving mucus membranes or skin necrosis: Unknown Has patient had a PCN reaction that required hospitalization: Unknown Has patient had a PCN reaction occurring within the last 10 years: Unknown If all of the above answers are "NO", then  may proceed with Cephalosporin use.   . Promethazine Hcl     DISCHARGE MEDICATIONS:   Current Discharge Medication List    START taking these medications   Details  apixaban (ELIQUIS) 2.5 MG TABS tablet Take 1 tablet (2.5 mg total) by mouth 2 (two) times daily. Qty: 60 tablet,  Refills: 0    docusate sodium (COLACE) 100 MG capsule Take 1 capsule (100 mg total) by mouth 2 (two) times daily as needed for mild constipation. Qty: 10 capsule, Refills: 0    doxycycline (VIBRA-TABS) 100 MG tablet Take 1 tablet (100 mg total) by mouth 2 (two) times daily with a meal. Qty: 14 tablet, Refills: 0    mupirocin ointment (BACTROBAN) 2 % Apply topically 2 (two) times daily. Qty: 22 g, Refills: 0      CONTINUE these medications which have NOT CHANGED   Details  acetaminophen (TYLENOL) 325 MG tablet Take 650 mg by mouth every 6 (six) hours as needed.    albuterol (PROVENTIL) (5 MG/ML) 0.5% nebulizer solution Take 2.5 mg by nebulization every 6 (six) hours as needed for wheezing or shortness of breath.    amLODipine (NORVASC) 10 MG tablet Take 1 tablet (10 mg total) by mouth daily. Qty: 30 tablet, Refills: 0    Fluticasone-Salmeterol (ADVAIR) 500-50 MCG/DOSE AEPB Inhale 1 puff into the lungs 2 (two) times daily.    ipratropium-albuterol (DUONEB) 0.5-2.5 (3) MG/3ML SOLN Take 3 mLs by nebulization every 6 (six) hours as needed.    meclizine (ANTIVERT) 12.5 MG tablet Take 12.5 mg by mouth 3 (three) times daily as needed for dizziness.    senna (SENOKOT) 8.6 MG TABS tablet Take 1 tablet by mouth daily as needed for mild constipation.    tiotropium (SPIRIVA) 18 MCG inhalation capsule Place 1 capsule (18 mcg total) into inhaler and inhale daily. Qty: 30 capsule, Refills: 0    traMADol (ULTRAM) 50 MG tablet Take 50 mg by mouth every 6 (six) hours as needed. Reported on 03/14/2016    albuterol (PROVENTIL HFA;VENTOLIN HFA) 108 (90 Base) MCG/ACT inhaler Inhale 2 puffs into the lungs every 6 (six) hours as needed for wheezing or shortness of breath.    feeding supplement, ENSURE ENLIVE, (ENSURE ENLIVE) LIQD Take 237 mLs by mouth 2 (two) times daily between meals. Qty: 237 mL, Refills: 12         DISCHARGE INSTRUCTIONS:   Follow-up with primary care physician in a  week Follow-up with cardiology Dr. Ubaldo Glassing in a week Continue home health PT Continue ambulating with walker as recommended by physical therapy  DIET:  Cardiac diet  DISCHARGE CONDITION:  Fair  ACTIVITY:  Activity as tolerated , ambulate with walker  OXYGEN:  Home Oxygen: No.   Oxygen Delivery: room air  DISCHARGE LOCATION:  home   If you experience worsening of your admission symptoms, develop shortness of breath, life threatening emergency, suicidal or homicidal thoughts you must seek medical attention immediately by calling 911 or calling your MD immediately  if symptoms less severe.  You Must read complete instructions/literature along with all the possible adverse reactions/side effects for all the Medicines you take and that have been prescribed to you. Take any new Medicines after you have completely understood and accpet all the possible adverse reactions/side effects.   Please note  You were cared for by a hospitalist during your hospital stay. If you have any questions about your discharge medications or the care you received while you were in the hospital after you are  discharged, you can call the unit and asked to speak with the hospitalist on call if the hospitalist that took care of you is not available. Once you are discharged, your primary care physician will handle any further medical issues. Please note that NO REFILLS for any discharge medications will be authorized once you are discharged, as it is imperative that you return to your primary care physician (or establish a relationship with a primary care physician if you do not have one) for your aftercare needs so that they can reassess your need for medications and monitor your lab values.     Today  Chief Complaint  Patient presents with  . Sore  . Constipation   Patient is out of bed to chair. Dizziness significantly improved. No complaints. Had a bowel movement. No abdominal pain. Daughter-in-law at  bedside  ROS:  CONSTITUTIONAL: Denies fevers, chills. Denies any fatigue, weakness.  EYES: Denies blurry vision, double vision, eye pain. EARS, NOSE, THROAT: Denies tinnitus, ear pain, hearing loss. RESPIRATORY: Denies cough, wheeze, shortness of breath.  CARDIOVASCULAR: Denies chest pain, palpitations, edema.  GASTROINTESTINAL: Denies nausea, vomiting, diarrhea, abdominal pain. Denies bright red blood per rectum. GENITOURINARY: Denies dysuria, hematuria. ENDOCRINE: Denies nocturia or thyroid problems. HEMATOLOGIC AND LYMPHATIC: Denies easy bruising or bleeding. SKIN: Denies rash or lesion. MUSCULOSKELETAL: Denies pain in neck, back, shoulder, knees, hips or arthritic symptoms.  NEUROLOGIC: Denies paralysis, paresthesias.  PSYCHIATRIC: Denies anxiety or depressive symptoms.   VITAL SIGNS:  Blood pressure 124/63, pulse 73, temperature 97.9 F (36.6 C), temperature source Oral, resp. rate 18, height 5\' 4"  (1.626 m), weight 50.3 kg (110 lb 14.4 oz), SpO2 96 %.  I/O:    Intake/Output Summary (Last 24 hours) at 06/17/17 1130 Last data filed at 06/17/17 1047  Gross per 24 hour  Intake              240 ml  Output                0 ml  Net              240 ml    PHYSICAL EXAMINATION:  GENERAL:  83 y.o.-year-old patient lying in the bed with no acute distress.  EYES: Pupils equal, round, reactive to light and accommodation. No scleral icterus. Extraocular muscles intact.  HEENT: Head atraumatic, normocephalic. Oropharynx and nasopharynx clear.  NECK:  Supple, no jugular venous distention. No thyroid enlargement, no tenderness.  LUNGS: Normal breath sounds bilaterally, no wheezing, rales,rhonchi or crepitation. No use of accessory muscles of respiration.  CARDIOVASCULAR: S1, S2 normal. No murmurs, rubs, or gallops.  ABDOMEN: Soft, non-tender, non-distended. Bowel sounds present. No organomegaly or mass.  EXTREMITIES: No pedal edema, cyanosis, or clubbing.  NEUROLOGIC: Cranial nerves II  through XII are intact. Muscle strength 5/5 in all extremities. Sensation intact. Gait not checked.  PSYCHIATRIC: The patient is alert and oriented x 3.  SKIN: Lesion on the top of the scalp-post herpetic and nonhealing , erythematous but no fluctuation, no purulent discharge. No rash  DATA REVIEW:   CBC  Recent Labs Lab 06/17/17 0615  WBC 4.1  HGB 12.0  HCT 35.9  PLT 195    Chemistries   Recent Labs Lab 06/14/17 2331  06/17/17 0615  NA 134*  < > 136  K 3.4*  < > 3.7  CL 100*  < > 103  CO2 25  < > 28  GLUCOSE 149*  < > 89  BUN 14  < > 17  CREATININE 0.65  < > 0.63  CALCIUM 9.2  < > 8.8*  AST 22  --   --   ALT 16  --   --   ALKPHOS 35*  --   --   BILITOT 0.8  --   --   < > = values in this interval not displayed.  Cardiac Enzymes  Recent Labs Lab 06/14/17 2331  TROPONINI <0.03    Microbiology Results  Results for orders placed or performed during the hospital encounter of 06/28/10  Urine culture     Status: None   Collection Time: 06/27/10  1:53 PM  Result Value Ref Range Status   Specimen Description URINE, CLEAN CATCH  Final   Special Requests A  Final   Culture  Setup Time [id number]  Final   Colony Count 4,000 COLONIES/ML  Final   Culture INSIGNIFICANT GROWTH  Final   Report Status 06/28/2010 FINAL  Final  Surgical pcr screen     Status: None   Collection Time: 06/27/10  1:54 PM  Result Value Ref Range Status   MRSA, PCR NEGATIVE NEGATIVE Final   Staphylococcus aureus  NEGATIVE Final    NEGATIVE        The Xpert SA Assay (FDA approved for NASAL specimens only), is one component of a comprehensive surveillance program.  It is not intended to diagnose infection nor to guide or monitor treatment.  Wound culture     Status: None   Collection Time: 06/28/10  1:50 PM  Result Value Ref Range Status   Specimen Description WOUND KNEE LEFT  Final   Special Requests PT ON VANC  Final   Gram Stain   Final    NO WBC SEEN NO SQUAMOUS EPITHELIAL CELLS  SEEN NO ORGANISMS SEEN   Culture NO GROWTH 2 DAYS  Final   Report Status 07/01/2010 FINAL  Final  Anaerobic culture     Status: None   Collection Time: 06/28/10  1:50 PM  Result Value Ref Range Status   Specimen Description WOUND KNEE LEFT  Final   Special Requests PT ON VANC  Final   Gram Stain   Final    NO WBC SEEN NO SQUAMOUS EPITHELIAL CELLS SEEN NO ORGANISMS SEEN   Culture NO ANAEROBES ISOLATED  Final   Report Status 07/03/2010 FINAL  Final  AFB culture     Status: None   Collection Time: 06/28/10  1:50 PM  Result Value Ref Range Status   Specimen Description WOUND KNEE LEFT  Final   Special Requests PT ON VANC  Final   Acid Fast Smear NO ACID FAST BACILLI SEEN  Final   Culture NO ACID FAST BACILLI ISOLATED IN 6 WEEKS  Final   Report Status 08/12/2010 FINAL  Final    RADIOLOGY:  Dg Abdomen Acute W/chest  Result Date: 06/14/2017 CLINICAL DATA:  No bowel movement for 2-3 days, constipation EXAM: DG ABDOMEN ACUTE W/ 1V CHEST COMPARISON:  Chest radiograph 05/31/2017 FINDINGS: Normal heart size, mediastinal contours, and pulmonary vascularity. Atherosclerotic calcification aorta. Emphysematous changes without infiltrate, pleural effusion, or pneumothorax. Skin folds project over lower lateral RIGHT chest. Diffuse osseous demineralization with pronounced thoracolumbar scoliosis. Nonobstructive bowel gas pattern with normal retained stool burden. No bowel dilatation, bowel wall thickening or free air. IMPRESSION: Emphysematous changes without acute infiltrate. Nonobstructive bowel gas pattern. Electronically Signed   By: Lavonia Dana M.D.   On: 06/14/2017 23:54    EKG:   Orders placed or performed during the hospital encounter of  06/14/17  . ED EKG  . ED EKG  . EKG 12-Lead  . EKG 12-Lead      Management plans discussed with the patient, family and they are in agreement.  CODE STATUS:     Code Status Orders        Start     Ordered   06/15/17 0333  Full code   Continuous     06/15/17 0332    Code Status History    Date Active Date Inactive Code Status Order ID Comments User Context   05/31/2017  4:25 PM 06/01/2017  6:56 PM Full Code [id number]  [name], MD Inpatient   01/05/2017  3:55 PM 01/06/2017  4:48 PM Full Code [id number]  Theodoro Grist, MD Inpatient   03/15/2016  2:25 AM 03/18/2016  9:49 PM Full Code [id number]  Saundra Shelling, MD Inpatient    Advance Directive Documentation     Most Recent Value  Type of Advance Directive  Healthcare Power of Attorney  Pre-existing out of facility DNR order (yellow form or pink MOST form)  -  "MOST" Form in Place?  -      TOTAL TIME TAKING CARE OF THIS PATIENT: 45  minutes.   Note: This dictation was prepared with Dragon dictation along with smaller phrase technology. Any transcriptional errors that result from this process are unintentional.   @[facility] @  on 06/17/2017 at 11:30 AM  Between 7am to 6pm - Pager - [phone]  After 6pm go to [email or web address] - password [email or web address]  [place] [facility]  Office  [phone]  CC: Primary care physician; Madelyn Brunner, MD

## 2017-06-17 NOTE — Progress Notes (Signed)
Cleatrice Burke to be D/C'd Home per MD order. Patient given discharge teaching and paperwork regarding medications, diet, follow-up appointments and activity. Patient understanding verbalized. No questions or complaints at this time. Skin condition as charted. IV and telemetry removed prior to leaving.  Home Health set up - No further needs by Care Management/Social Work. Prescriptions given to patient .      An After Visit Summary was printed and given to the patient. Caregiver/family present during discharge teaching.   Patient escorted via wheelchair  Terrilyn Saver

## 2017-06-17 NOTE — Progress Notes (Signed)
Pt refused bed alarm because the blinking light keeps her awake. Pt educated on risks and benefits. Pt shown call light and told to call for assistance. Pt verbalized agreement.

## 2017-08-18 ENCOUNTER — Emergency Department: Payer: Medicare Other

## 2017-08-18 ENCOUNTER — Other Ambulatory Visit: Payer: Self-pay

## 2017-08-18 ENCOUNTER — Inpatient Hospital Stay
Admission: EM | Admit: 2017-08-18 | Discharge: 2017-08-21 | DRG: 194 | Disposition: A | Discharge status: Home Health | Payer: Medicare Other | Attending: Internal Medicine | Admitting: Internal Medicine

## 2017-08-18 ENCOUNTER — Encounter: Payer: Self-pay | Admitting: Emergency Medicine

## 2017-08-18 DIAGNOSIS — I10 Essential (primary) hypertension: Secondary | ICD-10-CM | POA: Diagnosis present

## 2017-08-18 DIAGNOSIS — Z888 Allergy status to other drugs, medicaments and biological substances status: Secondary | ICD-10-CM | POA: Diagnosis not present

## 2017-08-18 DIAGNOSIS — Z880 Allergy status to penicillin: Secondary | ICD-10-CM | POA: Diagnosis not present

## 2017-08-18 DIAGNOSIS — E876 Hypokalemia: Secondary | ICD-10-CM | POA: Diagnosis present

## 2017-08-18 DIAGNOSIS — Z79899 Other long term (current) drug therapy: Secondary | ICD-10-CM

## 2017-08-18 DIAGNOSIS — I4510 Unspecified right bundle-branch block: Secondary | ICD-10-CM | POA: Diagnosis present

## 2017-08-18 DIAGNOSIS — J440 Chronic obstructive pulmonary disease with acute lower respiratory infection: Secondary | ICD-10-CM | POA: Diagnosis present

## 2017-08-18 DIAGNOSIS — Z886 Allergy status to analgesic agent status: Secondary | ICD-10-CM | POA: Diagnosis not present

## 2017-08-18 DIAGNOSIS — I482 Chronic atrial fibrillation: Secondary | ICD-10-CM | POA: Diagnosis present

## 2017-08-18 DIAGNOSIS — Z7901 Long term (current) use of anticoagulants: Secondary | ICD-10-CM

## 2017-08-18 DIAGNOSIS — J189 Pneumonia, unspecified organism: Secondary | ICD-10-CM | POA: Diagnosis present

## 2017-08-18 DIAGNOSIS — J181 Lobar pneumonia, unspecified organism: Secondary | ICD-10-CM | POA: Diagnosis not present

## 2017-08-18 DIAGNOSIS — R0902 Hypoxemia: Secondary | ICD-10-CM | POA: Diagnosis present

## 2017-08-18 DIAGNOSIS — J441 Chronic obstructive pulmonary disease with (acute) exacerbation: Secondary | ICD-10-CM | POA: Diagnosis present

## 2017-08-18 LAB — COMPREHENSIVE METABOLIC PANEL
ALBUMIN: 3 g/dL — AB (ref 3.5–5.0)
ALK PHOS: 88 U/L (ref 38–126)
ALT: 55 U/L — AB (ref 14–54)
ANION GAP: 12 (ref 5–15)
AST: 82 U/L — ABNORMAL HIGH (ref 15–41)
BUN: 17 mg/dL (ref 6–20)
CALCIUM: 8.8 mg/dL — AB (ref 8.9–10.3)
CO2: 25 mmol/L (ref 22–32)
Chloride: 97 mmol/L — ABNORMAL LOW (ref 101–111)
Creatinine, Ser: 0.45 mg/dL (ref 0.44–1.00)
GFR calc Af Amer: >60 mL/min (ref >60)
GFR calc non Af Amer: >60 mL/min (ref >60)
Glucose, Bld: 123 mg/dL — ABNORMAL HIGH (ref 65–99)
Potassium: 3.6 mmol/L (ref 3.5–5.1)
SODIUM: 134 mmol/L — AB (ref 135–145)
Total Bilirubin: 1.1 mg/dL (ref 0.3–1.2)
Total Protein: 7.3 g/dL (ref 6.5–8.1)

## 2017-08-18 LAB — LACTIC ACID, PLASMA: Lactic Acid, Venous: 1.6 mmol/L (ref 0.5–1.9)

## 2017-08-18 LAB — CBC
HEMATOCRIT: 34.4 % — AB (ref 35.0–47.0)
HEMOGLOBIN: 11.4 g/dL — AB (ref 12.0–16.0)
MCH: 29.5 pg (ref 26.0–34.0)
MCHC: 33.3 g/dL (ref 32.0–36.0)
MCV: 88.7 fL (ref 80.0–100.0)
Platelets: 310 10*3/uL (ref 150–440)
RBC: 3.87 MIL/uL (ref 3.80–5.20)
RDW: 13.4 % (ref 11.5–14.5)
WBC: 17.3 10*3/uL — AB (ref 3.6–11.0)

## 2017-08-18 LAB — FIBRIN DERIVATIVES D-DIMER (ARMC ONLY): FIBRIN DERIVATIVES D-DIMER (ARMC): 1220.81 ng{FEU}/mL — AB (ref 0.00–499.00)

## 2017-08-18 LAB — TROPONIN I: Troponin I: <0.03 ng/mL (ref <0.03)

## 2017-08-18 MED ORDER — SODIUM CHLORIDE 0.9 % IV SOLN
INTRAVENOUS | Status: AC
  Administered 2017-08-18: via INTRAVENOUS

## 2017-08-18 MED ORDER — MOMETASONE FURO-FORMOTEROL FUM 200-5 MCG/ACT IN AERO
2.0000 | INHALATION_SPRAY | Freq: Two times a day (BID) | RESPIRATORY_TRACT | Status: DC
  Administered 2017-08-19 – 2017-08-21 (×6): 2 via RESPIRATORY_TRACT
  Filled 2017-08-18: qty 8.8

## 2017-08-18 MED ORDER — LEVOFLOXACIN IN D5W 750 MG/150ML IV SOLN
750.0000 mg | Freq: Once | INTRAVENOUS | Status: AC
  Administered 2017-08-18: 750 mg via INTRAVENOUS
  Filled 2017-08-18: qty 150

## 2017-08-18 MED ORDER — ADULT MULTIVITAMIN W/MINERALS CH
1.0000 | ORAL_TABLET | Freq: Every day | ORAL | Status: DC
  Administered 2017-08-19 – 2017-08-21 (×3): 1 via ORAL
  Filled 2017-08-18 (×3): qty 1

## 2017-08-18 MED ORDER — ASPIRIN EC 81 MG PO TBEC
81.0000 mg | DELAYED_RELEASE_TABLET | Freq: Every day | ORAL | Status: DC
  Administered 2017-08-19 – 2017-08-21 (×3): 81 mg via ORAL
  Filled 2017-08-18 (×3): qty 1

## 2017-08-18 MED ORDER — APIXABAN 2.5 MG PO TABS
2.5000 mg | ORAL_TABLET | Freq: Two times a day (BID) | ORAL | Status: DC
  Administered 2017-08-18 – 2017-08-21 (×6): 2.5 mg via ORAL
  Filled 2017-08-18 (×6): qty 1

## 2017-08-18 MED ORDER — IOPAMIDOL (ISOVUE-370) INJECTION 76%
75.0000 mL | Freq: Once | INTRAVENOUS | Status: AC | PRN
  Administered 2017-08-18: 75 mL via INTRAVENOUS

## 2017-08-18 MED ORDER — HYDROCODONE-ACETAMINOPHEN 5-325 MG PO TABS: 1.0000 | ORAL_TABLET | ORAL | Status: DC | PRN

## 2017-08-18 MED ORDER — DEXTROSE 5 % IV SOLN
500.0000 mg | INTRAVENOUS | Status: DC
  Administered 2017-08-19: 500 mg via INTRAVENOUS
  Filled 2017-08-18 (×2): qty 500

## 2017-08-18 MED ORDER — ONDANSETRON HCL 4 MG PO TABS: 4.0000 mg | ORAL_TABLET | Freq: Four times a day (QID) | ORAL | Status: DC | PRN

## 2017-08-18 MED ORDER — METOPROLOL TARTRATE 5 MG/5ML IV SOLN
2.5000 mg | Freq: Four times a day (QID) | INTRAVENOUS | Status: DC
  Administered 2017-08-18 – 2017-08-19 (×2): 2.5 mg via INTRAVENOUS
  Filled 2017-08-18 (×2): qty 5

## 2017-08-18 MED ORDER — ONDANSETRON HCL 4 MG/2ML IJ SOLN
4.0000 mg | Freq: Four times a day (QID) | INTRAMUSCULAR | Status: DC | PRN
Start: 2017-08-18 — End: 2017-08-21

## 2017-08-18 MED ORDER — POLYETHYLENE GLYCOL 3350 17 G PO PACK: 17.0000 g | PACK | Freq: Every day | ORAL | Status: DC | PRN

## 2017-08-18 MED ORDER — DEXTROSE 5 % IV SOLN
1.0000 g | INTRAVENOUS | Status: DC
  Administered 2017-08-19 – 2017-08-20 (×3): 1 g via INTRAVENOUS
  Filled 2017-08-18 (×4): qty 10

## 2017-08-18 MED ORDER — GUAIFENESIN ER 600 MG PO TB12
600.0000 mg | ORAL_TABLET | Freq: Two times a day (BID) | ORAL | Status: DC
  Administered 2017-08-18 – 2017-08-21 (×6): 600 mg via ORAL
  Filled 2017-08-18 (×6): qty 1

## 2017-08-18 MED ORDER — IPRATROPIUM-ALBUTEROL 0.5-2.5 (3) MG/3ML IN SOLN
3.0000 mL | Freq: Four times a day (QID) | RESPIRATORY_TRACT | Status: DC
  Administered 2017-08-19 – 2017-08-21 (×11): 3 mL via RESPIRATORY_TRACT
  Filled 2017-08-18 (×11): qty 3

## 2017-08-18 MED ORDER — ACETAMINOPHEN 650 MG RE SUPP: 650.0000 mg | Freq: Four times a day (QID) | RECTAL | Status: DC | PRN

## 2017-08-18 MED ORDER — ACETAMINOPHEN 325 MG PO TABS
650.0000 mg | ORAL_TABLET | Freq: Four times a day (QID) | ORAL | Status: DC | PRN
  Administered 2017-08-19: 650 mg via ORAL
  Filled 2017-08-18: qty 2

## 2017-08-18 NOTE — ED Triage Notes (Signed)
Pt in via [facility] from home with complaints of left side chest pain w/ associated shortness of breath.  Pt A/OXx4, NAD noted at this time.  EDP to bedside.

## 2017-08-18 NOTE — ED Notes (Signed)
Patient transported to CT 

## 2017-08-18 NOTE — H&P (Signed)
[facility] NAME: Janice Hoffman    MR#:  [id number]  DATE OF BIRTH:  [birth year]  DATE OF ADMISSION:  08/18/2017  PRIMARY CARE PHYSICIAN: Madelyn Brunner, MD   REQUESTING/REFERRING PHYSICIAN:   CHIEF COMPLAINT:   Chief Complaint  Patient presents with  . Chest Pain    HISTORY OF PRESENT ILLNESS: Janice Hoffman  is a 83 y.o. female with a known history per below presenting with left-sided chest pain, shortness of breath, found to be tachycardic, tachypneic in the emergency room, noted pneumonia CT of the chest, white count 17,000, patient evaluated in the emergency room in no apparent distress, resting comfortably in bed, no family at bedside, patient is a poor historian, patient denies chest pain/ever having chest pain, patient will be admitted for acute community-acquired right lower lobe pneumonia and A. fib with RVR.  PAST MEDICAL HISTORY:   Past Medical History:  Diagnosis Date  . Asthma   . COPD (chronic obstructive pulmonary disease) ([place])   . Herpes zoster   . Hypertension   . Neuralgia     PAST SURGICAL HISTORY:  Past Surgical History:  Procedure Laterality Date  . LEG SURGERY      SOCIAL HISTORY:  Social History   Tobacco Use  . Smoking status: Never Smoker  . Smokeless tobacco: Never Used  Substance Use Topics  . Alcohol use: No    Alcohol/week: 0.0 oz    FAMILY HISTORY:  Family History  Problem Relation Age of Onset  . COPD Sister     DRUG ALLERGIES:  Allergies  Allergen Reactions  . Nsaids Shortness Of Breath    Throat swelling, shortness of breath  . Penicillins     Has patient had a PCN reaction causing immediate rash, facial/tongue/throat swelling, SOB or lightheadedness with hypotension: Unknown Has patient had a PCN reaction causing severe rash involving mucus membranes or skin necrosis: Unknown Has patient had a PCN reaction that required hospitalization: Unknown Has patient had a PCN  reaction occurring within the last 10 years: Unknown If all of the above answers are "NO", then may proceed with Cephalosporin use.   . Promethazine Hcl     REVIEW OF SYSTEMS:  Unable to be obtained given poor historian  MEDICATIONS AT HOME:  Prior to Admission medications   Medication Sig Start Date End Date Taking? Authorizing Provider  acetaminophen (TYLENOL) 325 MG tablet Take 650 mg by mouth every 6 (six) hours as needed.    [provider]  albuterol (PROVENTIL HFA;VENTOLIN HFA) 108 (90 Base) MCG/ACT inhaler Inhale 2 puffs into the lungs every 6 (six) hours as needed for wheezing or shortness of breath.    [provider]  albuterol (PROVENTIL) (5 MG/ML) 0.5% nebulizer solution Take 2.5 mg by nebulization every 6 (six) hours as needed for wheezing or shortness of breath.    [provider]  amLODipine (NORVASC) 10 MG tablet Take 1 tablet (10 mg total) by mouth daily. 06/01/17   [name], MD  apixaban (ELIQUIS) 2.5 MG TABS tablet Take 1 tablet (2.5 mg total) by mouth 2 (two) times daily. 06/17/17   [name], MD  docusate sodium (COLACE) 100 MG capsule Take 1 capsule (100 mg total) by mouth 2 (two) times daily as needed for mild constipation. 06/17/17   [name], MD  doxycycline (VIBRA-TABS) 100 MG tablet Take 1 tablet (100 mg total) by mouth 2 (two) times daily with a meal. 06/17/17  Gouru, Illene Silver, MD  feeding supplement, ENSURE ENLIVE, (ENSURE ENLIVE) LIQD Take 237 mLs by mouth 2 (two) times daily between meals. 03/18/16   [name], MD  Fluticasone-Salmeterol (ADVAIR) 500-50 MCG/DOSE AEPB Inhale 1 puff into the lungs 2 (two) times daily.    [provider]  ipratropium-albuterol (DUONEB) 0.5-2.5 (3) MG/3ML SOLN Take 3 mLs by nebulization every 6 (six) hours as needed.    [provider]  meclizine (ANTIVERT) 12.5 MG tablet Take 12.5 mg by mouth 3 (three) times daily as needed for dizziness.    [provider]  mupirocin  ointment (BACTROBAN) 2 % Apply topically 2 (two) times daily. 06/17/17   [name], MD  senna (SENOKOT) 8.6 MG TABS tablet Take 1 tablet by mouth daily as needed for mild constipation.    [provider]  tiotropium (SPIRIVA) 18 MCG inhalation capsule Place 1 capsule (18 mcg total) into inhaler and inhale daily. 01/07/17   Max Sane, MD  traMADol (ULTRAM) 50 MG tablet Take 50 mg by mouth every 6 (six) hours as needed. Reported on 03/14/2016    [provider]      PHYSICAL EXAMINATION:   VITAL SIGNS: Blood pressure 132/63, pulse (!) 114, temperature 98.6 F (37 C), temperature source Oral, resp. rate 20, height 5\' 3"  (1.6 m), weight 55.3 kg (122 lb), SpO2 97 %.  GENERAL:  83 y.o.-year-old patient lying in the bed with no acute distress. Frail appearing EYES: Pupils equal, round, reactive to light and accommodation. No scleral icterus. Extraocular muscles intact.  HEENT: Head atraumatic, normocephalic. Oropharynx and nasopharynx clear.  NECK:  Supple, no jugular venous distention. No thyroid enlargement, no tenderness.  LUNGS: Coarse breath sounds, no wheezing, ,rhonchi or crepitation. No use of accessory muscles of respiration.  CARDIOVASCULAR: S1, S2 normal. No murmurs, rubs, or gallops.  ABDOMEN: Soft, nontender, nondistended. Bowel sounds present. No organomegaly or mass.  EXTREMITIES: No pedal edema, cyanosis, or clubbing.  NEUROLOGIC: Cranial nerves II through XII are intact. MAES. Gait not checked.  PSYCHIATRIC: The patient is alert and oriented x 3.  SKIN: No obvious rash, lesion, or ulcer.   LABORATORY PANEL:   CBC Recent Labs  Lab 08/18/17 1906  WBC 17.3*  HGB 11.4*  HCT 34.4*  PLT 310  MCV 88.7  MCH 29.5  MCHC 33.3  RDW 13.4   ------------------------------------------------------------------------------------------------------------------  Chemistries  Recent Labs  Lab 08/18/17 1906  NA 134*  K 3.6  CL 97*  CO2 25  GLUCOSE 123*  BUN 17   CREATININE 0.45  CALCIUM 8.8*  AST 82*  ALT 55*  ALKPHOS 88  BILITOT 1.1   ------------------------------------------------------------------------------------------------------------------ estimated creatinine clearance is 39.4 mL/min (by C-G formula based on SCr of 0.45 mg/dL). ------------------------------------------------------------------------------------------------------------------ No results for input(s): TSH, T4TOTAL, T3FREE, THYROIDAB in the last 72 hours.  Invalid input(s): FREET3   Coagulation profile No results for input(s): INR, PROTIME in the last 168 hours. ------------------------------------------------------------------------------------------------------------------- No results for input(s): DDIMER in the last 72 hours. -------------------------------------------------------------------------------------------------------------------  Cardiac Enzymes Recent Labs  Lab 08/18/17 1906  TROPONINI <0.03   ------------------------------------------------------------------------------------------------------------------ Invalid input(s): POCBNP  ---------------------------------------------------------------------------------------------------------------  Urinalysis    Component Value Date/Time   COLORURINE YELLOW (A) 06/15/2017 0049   APPEARANCEUR CLEAR (A) 06/15/2017 0049   LABSPEC 1.009 06/15/2017 0049   PHURINE 5.0 06/15/2017 0049   GLUCOSEU 50 (A) 06/15/2017 0049   HGBUR NEGATIVE 06/15/2017 [place] NEGATIVE 06/15/2017 [place] 06/15/2017 0049   PROTEINUR NEGATIVE 06/15/2017 0049  UROBILINOGEN 0.2 06/27/2010 1353   NITRITE NEGATIVE 06/15/2017 0049   LEUKOCYTESUR NEGATIVE 06/15/2017 0049     RADIOLOGY: Dg Chest 2 View  Result Date: 08/18/2017 CLINICAL DATA:  Left-sided chest pain and dyspnea at home today. EXAM: CHEST  2 VIEW COMPARISON:  06/14/2017 FINDINGS: Left base consolidation superimposed on chronic  emphysematous and fibrotic changes. This probably represents pneumonia. Pulmonary vasculature is normal. No effusion. Hilar, mediastinal and cardiac contours are unremarkable and unchanged. Moderate hyperinflation. IMPRESSION: Left lower lobe pneumonia superimposed on COPD. Followup PA and lateral chest X-ray is recommended in 3-4 weeks following trial of antibiotic therapy to ensure resolution and exclude underlying malignancy. Electronically Signed   By: Andreas Newport M.D.   On: 08/18/2017 19:33   Ct Angio Chest Pe W And/or Wo Contrast  Result Date: 08/18/2017 CLINICAL DATA:  Left-sided chest pain and shortness of breath. EXAM: CT ANGIOGRAPHY CHEST WITH CONTRAST TECHNIQUE: Multidetector CT imaging of the chest was performed using the standard protocol during bolus administration of intravenous contrast. Multiplanar CT image reconstructions and MIPs were obtained to evaluate the vascular anatomy. CONTRAST:  22mL ISOVUE-370 IOPAMIDOL (ISOVUE-370) INJECTION 76% COMPARISON:  03/15/2016 FINDINGS: Cardiovascular: The heart size is normal. No pericardial effusion. Coronary artery calcification is evident. Atherosclerotic calcification is noted in the wall of the thoracic aorta. No filling defect in the opacified pulmonary arteries to suggest the presence of an acute pulmonary embolus. Mediastinum/Nodes: 16 mm short axis subcarinal lymph node is associated with small lymph nodes in the left hilum. The esophagus has normal imaging features. There is no axillary lymphadenopathy. Asymmetric enlargement of the right thyroid lobe is stable with low-density left thyroid nodule similar to prior. Lungs/Pleura: Emphysema noted bilaterally. 7 mm nodule right apex is similar to prior. Tree-in-bud nodularity in the right upper lobe is new in the interval. Persistent tree-in-bud nodularity is seen in the left upper lobe. There is tree-in-bud nodularity in the posterior left lower lobe with areas of patchy and ultimately  confluent airspace consolidation at the left base. Upper Abdomen: Unremarkable. Musculoskeletal: Thoracolumbar scoliosis. Review of the MIP images confirms the above findings. IMPRESSION: 1. No CT evidence for acute pulmonary embolus. 2. Confluent airspace consolidation posterior left lower lobe suggests pneumonia. 3. Scattered areas of tree-in-bud opacity in both upper lobes and central left lower lobe suggesting atypical infection. 4. Mediastinal and borderline left hilar lymphadenopathy, presumably reactive. Electronically Signed   By: Misty Stanley M.D.   On: 08/18/2017 20:27    EKG: Orders placed or performed during the hospital encounter of 08/18/17  . EKG 12-Lead  . EKG 12-Lead  . ED EKG  . ED EKG    IMPRESSION AND PLAN: 1 acute left lower lobe community acquired pneumonia Admit to regular nursing floor bed on our pneumonia protocol, empiric Rocephin/azithromycin, 7 oxygen as needed, mucolytic agents, follow up on cultures  2 COPD/asthma without exacerbation Stable Breathing treatments as needed   3 acute on chronic A. fib with RVR Exacerbated by above Continue Eliquis, IV Lopressor every 6 hours with hold parameters, complete MAR when available  4 incomplete MAR Complete MAR when available  5 chronic benign essential hypertension Plan of care as stated above Complete MAR when available  Full code Condition stable Prognosis fair DVT prophylaxis-on Eliquis Disposition home in 2-3 days barring any complications  all the records are reviewed and case discussed with ED provider. Management plans discussed with the patient, family and they are in agreement.  CODE STATUS: Code Status History  Date Active Date Inactive Code Status Order ID Comments User Context   06/15/2017 03:32 06/17/2017 15:11 Full Code [id number]  Harrie Foreman, MD ED   05/31/2017 16:25 06/01/2017 18:56 Full Code [id number]  [name], MD Inpatient   01/05/2017 15:55 01/06/2017 16:48 Full Code  [id number]  Theodoro Grist, MD Inpatient   03/15/2016 02:25 03/18/2016 21:49 Full Code [id number]  Saundra Shelling, MD Inpatient    Advance Directive Documentation     Most Recent Value  Type of Advance Directive  Healthcare Power of Attorney, Living will  Pre-existing out of facility DNR order (yellow form or pink MOST form)  No data  "MOST" Form in Place?  No data       TOTAL TIME TAKING CARE OF THIS PATIENT: 45 minutes.    Avel Peace English [place] M.D on 08/18/2017   Between 7am to 6pm - Pager - [phone]  After 6pm go to [email or web address] - password [email or web address] [place]  Office  [phone]  CC: Primary care physician; Madelyn Brunner, MD   Note: This dictation was prepared with Dragon dictation along with smaller phrase technology. Any transcriptional errors that result from this process are unintentional.

## 2017-08-18 NOTE — ED Notes (Signed)
Patient transported to X-ray 

## 2017-08-18 NOTE — ED Provider Notes (Signed)
[facility] Emergency Department Provider Note       Time seen: ----------------------------------------- 7:06 PM on 08/18/2017 -----------------------------------------   I have reviewed the triage vital signs and the nursing notes.  HISTORY   Chief Complaint Chest Pain    HPI Janice Hoffman is a 83 y.o. female with a history of asthma, COPD, hypertension, A. fib who presents to the ED for for left-sided chest pain with associated shortness of breath.  Patient reports she has had these symptoms for "a while".  She states symptoms worsened tonight.  She has a catch she states in her left chest that makes it hard to breathe.  She denies fevers, chills or other complaints.  Past Medical History:  Diagnosis Date  . Asthma   . COPD (chronic obstructive pulmonary disease) ([place])   . Herpes zoster   . Hypertension   . Neuralgia     Patient Active Problem List   Diagnosis Date Noted  . New onset a-fib ([place]) 06/15/2017  . Near syncope 05/31/2017  . COPD with acute exacerbation ([place]) 01/05/2017  . Essential hypertension, malignant 01/05/2017  . Dysphagia 01/05/2017  . COPD exacerbation ([place]) 03/15/2016  . 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 ELEVATED BLOOD PRESSURE 05/12/2008  . HERPES ZOSTER 04/26/2008  . B12 DEFICIENCY 04/26/2008  . CONSTIPATION 04/26/2008  . LEG EDEMA, BILATERAL 04/26/2008    Past Surgical History:  Procedure Laterality Date  . LEG SURGERY      Allergies Nsaids; Penicillins; and Promethazine hcl  Social History Social History   Tobacco Use  . Smoking status: Never Smoker  . Smokeless tobacco: Never Used  Substance Use Topics  . Alcohol use: No    Alcohol/week: 0.0 oz  . Drug use: No    Review of Systems Constitutional: Negative for fever. Cardiovascular: Positive for chest pain Respiratory: Positive for shortness of breath Gastrointestinal: Negative for abdominal pain, vomiting and diarrhea. Musculoskeletal:  Negative for back pain. Skin: Negative for rash. Neurological: Negative for headaches, focal weakness or numbness.  All systems negative/normal/unremarkable except as stated in the HPI  ____________________________________________   PHYSICAL EXAM:  VITAL SIGNS: ED Triage Vitals  Enc Vitals Group     BP --      Pulse --      Resp 08/18/17 1905 (!) 21     Temp 08/18/17 1905 98.6 F (37 C)     Temp Source 08/18/17 1905 Oral     SpO2 --      Weight 08/18/17 1904 122 lb (55.3 kg)     Height 08/18/17 1904 5\' 3"  (1.6 m)     Head Circumference --      Peak Flow --      Pain Score 08/18/17 1904 10     Pain Loc --      Pain Edu? --      Excl. in [place]? --    Constitutional: Alert and oriented.  Mild distress Eyes: Conjunctivae are normal. Normal extraocular movements. ENT   Head: Normocephalic and atraumatic.   Nose: No congestion/rhinnorhea.   Mouth/Throat: Mucous membranes are moist.   Neck: No stridor. Cardiovascular: Rapid rate, irregular rhythm. No murmurs, rubs, or gallops. Respiratory: Normal respiratory effort without tachypnea nor retractions.  Mild wheezing bilaterally Gastrointestinal: Soft and nontender. Normal bowel sounds Musculoskeletal: Nontender with normal range of motion in extremities. No lower extremity tenderness nor edema. Neurologic:  Normal speech and language. No gross focal neurologic deficits are appreciated.  Skin:  Skin is warm, dry and intact.  No rash noted. Psychiatric: Mood and affect are normal. Speech and behavior are normal.  ____________________________________________  EKG: Interpreted by me.  Atrial fibrillation with a rate of 109 bpm, incomplete right bundle branch block, LVH with repolarization abnormality, normal axis.  ____________________________________________  ED COURSE:  Pertinent labs & imaging results that were available during my care of the patient were reviewed by me and considered in my medical decision making  (see chart for details). Patient presents for chest pain and shortness of breath, we will assess with labs and imaging as indicated. Clinical Course as of Aug 18 2010  Mon Aug 18, 2017  1958 Fibrin derivatives D-dimer [facility]): (!) 1,220.81 [JW]  1958 I will order a CT chest  [JW]    Clinical Course User Index [JW] Earleen Newport, MD   Procedures ____________________________________________   LABS (pertinent positives/negatives)  Labs Reviewed  CBC - Abnormal; Notable for the following components:      Result Value   WBC 17.3 (*)    Hemoglobin 11.4 (*)    HCT 34.4 (*)    All other components within normal limits  COMPREHENSIVE METABOLIC PANEL - Abnormal; Notable for the following components:   Sodium 134 (*)    Chloride 97 (*)    Glucose, Bld 123 (*)    Calcium 8.8 (*)    Albumin 3.0 (*)    AST 82 (*)    ALT 55 (*)    All other components within normal limits  FIBRIN DERIVATIVES D-DIMER ([facility] ONLY) - Abnormal; Notable for the following components:   Fibrin derivatives D-dimer ([facility]) 1,220.81 (*)    All other components within normal limits  CULTURE, BLOOD (ROUTINE X 2)  CULTURE, BLOOD (ROUTINE X 2)  TROPONIN I  LACTIC ACID, PLASMA   CRITICAL CARE Performed by: Earleen Newport   Total critical care time: 30 minutes  Critical care time was exclusive of separately billable procedures and treating other patients.  Critical care was necessary to treat or prevent imminent or life-threatening deterioration.  Critical care was time spent personally by me on the following activities: development of treatment plan with patient and/or surrogate as well as nursing, discussions with consultants, evaluation of patient's response to treatment, examination of patient, obtaining history from patient or surrogate, ordering and performing treatments and interventions, ordering and review of laboratory studies, ordering and review of radiographic studies, pulse oximetry and  re-evaluation of patient's condition.  RADIOLOGY Images were viewed by me  Chest x-ray  IMPRESSION: Left lower lobe pneumonia superimposed on COPD. Followup PA and lateral chest X-ray is recommended in 3-4 weeks following trial of antibiotic therapy to ensure resolution and exclude underlying Malignancy. IMPRESSION: 1. No CT evidence for acute pulmonary embolus. 2. Confluent airspace consolidation posterior left lower lobe suggests pneumonia. 3. Scattered areas of tree-in-bud opacity in both upper lobes and central left lower lobe suggesting atypical infection. 4. Mediastinal and borderline left hilar lymphadenopathy, presumably reactive. ____________________________________________  DIFFERENTIAL DIAGNOSIS   Muscle strain, pneumonia, PE, pneumothorax, MI  FINAL ASSESSMENT AND PLAN  Left lower lobe pneumonia, COPD   Plan: Patient had presented for chest pain and difficulty breathing. Patient's labs reveal leukocytosis likely from underlying pneumonia.  D-dimer was also elevated.  Patient's imaging left lower lobe pneumonia superimposed on COPD.   Earleen Newport, MD   Note: This note was generated in part or whole with voice recognition software. Voice recognition is usually quite accurate but there are transcription errors that can and very often do  occur. I apologize for any typographical errors that were not detected and corrected.     Earleen Newport, MD 08/18/17 2038

## 2017-08-19 LAB — BASIC METABOLIC PANEL
ANION GAP: 9 (ref 5–15)
BUN: 14 mg/dL (ref 6–20)
CHLORIDE: 99 mmol/L — AB (ref 101–111)
CO2: 24 mmol/L (ref 22–32)
CREATININE: 0.44 mg/dL (ref 0.44–1.00)
Calcium: 8.3 mg/dL — ABNORMAL LOW (ref 8.9–10.3)
GFR calc non Af Amer: >60 mL/min (ref >60)
Glucose, Bld: 111 mg/dL — ABNORMAL HIGH (ref 65–99)
Potassium: 3.4 mmol/L — ABNORMAL LOW (ref 3.5–5.1)
Sodium: 132 mmol/L — ABNORMAL LOW (ref 135–145)

## 2017-08-19 LAB — CBC
HCT: 30.7 % — ABNORMAL LOW (ref 35.0–47.0)
HEMOGLOBIN: 10.3 g/dL — AB (ref 12.0–16.0)
MCH: 29.6 pg (ref 26.0–34.0)
MCHC: 33.6 g/dL (ref 32.0–36.0)
MCV: 88.2 fL (ref 80.0–100.0)
Platelets: 270 10*3/uL (ref 150–440)
RBC: 3.48 MIL/uL — AB (ref 3.80–5.20)
RDW: 13.2 % (ref 11.5–14.5)
WBC: 13.8 10*3/uL — ABNORMAL HIGH (ref 3.6–11.0)

## 2017-08-19 LAB — TROPONIN I
Troponin I: <0.03 ng/mL (ref <0.03)
Troponin I: 0.03 ng/mL (ref <0.03)

## 2017-08-19 LAB — MAGNESIUM: Magnesium: 1.8 mg/dL (ref 1.7–2.4)

## 2017-08-19 MED ORDER — METOPROLOL TARTRATE 25 MG PO TABS
25.0000 mg | ORAL_TABLET | Freq: Two times a day (BID) | ORAL | Status: DC
  Administered 2017-08-19 – 2017-08-21 (×5): 25 mg via ORAL
  Filled 2017-08-19 (×5): qty 1

## 2017-08-19 MED ORDER — SODIUM CHLORIDE 0.9 % IV SOLN
INTRAVENOUS | Status: DC
  Administered 2017-08-19: 14:00:00 via INTRAVENOUS

## 2017-08-19 MED ORDER — POTASSIUM CHLORIDE CRYS ER 20 MEQ PO TBCR
40.0000 meq | EXTENDED_RELEASE_TABLET | Freq: Once | ORAL | Status: AC
  Administered 2017-08-19: 40 meq via ORAL
  Filled 2017-08-19: qty 2

## 2017-08-19 MED ORDER — ALPRAZOLAM 0.25 MG PO TABS
0.2500 mg | ORAL_TABLET | Freq: Once | ORAL | Status: AC
  Administered 2017-08-19: 0.25 mg via ORAL
  Filled 2017-08-19: qty 1

## 2017-08-19 NOTE — Progress Notes (Signed)
[facility] [place] NAME: Janice Hoffman    MR#:  [id number]  DATE OF BIRTH:  1926/01/19  SUBJECTIVE:  CHIEF COMPLAINT:   Chief Complaint  Patient presents with  . Chest Pain   - confused, breathing some better - stays at home by herself, on 2L o2 which is acute  REVIEW OF SYSTEMS:  Review of Systems  Constitutional: Positive for malaise/fatigue. Negative for chills and fever.  HENT: Negative for congestion, ear discharge, hearing loss and nosebleeds.   Eyes: Negative for blurred vision and double vision.  Respiratory: Positive for cough and shortness of breath. Negative for wheezing.   Cardiovascular: Negative for chest pain, palpitations and leg swelling.  Gastrointestinal: Negative for abdominal pain, constipation, diarrhea, nausea and vomiting.  Genitourinary: Negative for dysuria.  Musculoskeletal: Negative for myalgias.  Neurological: Negative for dizziness, speech change, focal weakness, seizures and headaches.  Psychiatric/Behavioral:       Confused    DRUG ALLERGIES:   Allergies  Allergen Reactions  . Nsaids Shortness Of Breath    Throat swelling, shortness of breath  . Penicillins     Has patient had a PCN reaction causing immediate rash, facial/tongue/throat swelling, SOB or lightheadedness with hypotension: Unknown Has patient had a PCN reaction causing severe rash involving mucus membranes or skin necrosis: Unknown Has patient had a PCN reaction that required hospitalization: Unknown Has patient had a PCN reaction occurring within the last 10 years: Unknown If all of the above answers are "NO", then may proceed with Cephalosporin use.   . Promethazine Hcl     VITALS:  Blood pressure 126/67, pulse 87, temperature 97.7 F (36.5 C), temperature source Oral, resp. rate 20, height 5\' 3"  (1.6 m), weight 55.3 kg (122 lb), SpO2 100 %.  PHYSICAL EXAMINATION:  Physical Exam  GENERAL:  83 y.o.-year-old patient lying  in the bed with no acute distress.  EYES: Pupils equal, round, reactive to light and accommodation. No scleral icterus. Extraocular muscles intact.  HEENT: Head atraumatic, normocephalic. Oropharynx and nasopharynx clear.  NECK:  Supple, no jugular venous distention. No thyroid enlargement, no tenderness.  LUNGS: Normal breath sounds bilaterally, no wheezing, rales,rhonchi or crepitation. No use of accessory muscles of respiration. Decreased bibasilar breath sounds CARDIOVASCULAR: S1, S2 normal. No murmurs, rubs, or gallops.  ABDOMEN: Soft, nontender, nondistended. Bowel sounds present. No organomegaly or mass.  EXTREMITIES: No pedal edema, cyanosis, or clubbing.  NEUROLOGIC: Cranial nerves II through XII are intact. Muscle strength 5/5 in all extremities. Sensation intact. Gait not checked.  PSYCHIATRIC: The patient is alert and oriented x 3. Some confusion SKIN: No obvious rash, lesion, or ulcer.    LABORATORY PANEL:   CBC Recent Labs  Lab 08/19/17 0515  WBC 13.8*  HGB 10.3*  HCT 30.7*  PLT 270   ------------------------------------------------------------------------------------------------------------------  Chemistries  Recent Labs  Lab 08/18/17 1906 08/19/17 0515  NA 134* 132*  K 3.6 3.4*  CL 97* 99*  CO2 25 24  GLUCOSE 123* 111*  BUN 17 14  CREATININE 0.45 0.44  CALCIUM 8.8* 8.3*  AST 82*  --   ALT 55*  --   ALKPHOS 88  --   BILITOT 1.1  --    ------------------------------------------------------------------------------------------------------------------  Cardiac Enzymes Recent Labs  Lab 08/19/17 1130  TROPONINI 0.03*   ------------------------------------------------------------------------------------------------------------------  RADIOLOGY:  Dg Chest 2 View  Result Date: 08/18/2017 CLINICAL DATA:  Left-sided chest pain and dyspnea at home today. EXAM: CHEST  2 VIEW COMPARISON:  06/14/2017 FINDINGS: Left base consolidation superimposed on chronic  emphysematous and fibrotic changes. This probably represents pneumonia. Pulmonary vasculature is normal. No effusion. Hilar, mediastinal and cardiac contours are unremarkable and unchanged. Moderate hyperinflation. IMPRESSION: Left lower lobe pneumonia superimposed on COPD. Followup PA and lateral chest X-ray is recommended in 3-4 weeks following trial of antibiotic therapy to ensure resolution and exclude underlying malignancy. Electronically Signed   By: Andreas Newport M.D.   On: 08/18/2017 19:33   Ct Angio Chest Pe W And/or Wo Contrast  Result Date: 08/18/2017 CLINICAL DATA:  Left-sided chest pain and shortness of breath. EXAM: CT ANGIOGRAPHY CHEST WITH CONTRAST TECHNIQUE: Multidetector CT imaging of the chest was performed using the standard protocol during bolus administration of intravenous contrast. Multiplanar CT image reconstructions and MIPs were obtained to evaluate the vascular anatomy. CONTRAST:  16mL ISOVUE-370 IOPAMIDOL (ISOVUE-370) INJECTION 76% COMPARISON:  03/15/2016 FINDINGS: Cardiovascular: The heart size is normal. No pericardial effusion. Coronary artery calcification is evident. Atherosclerotic calcification is noted in the wall of the thoracic aorta. No filling defect in the opacified pulmonary arteries to suggest the presence of an acute pulmonary embolus. Mediastinum/Nodes: 16 mm short axis subcarinal lymph node is associated with small lymph nodes in the left hilum. The esophagus has normal imaging features. There is no axillary lymphadenopathy. Asymmetric enlargement of the right thyroid lobe is stable with low-density left thyroid nodule similar to prior. Lungs/Pleura: Emphysema noted bilaterally. 7 mm nodule right apex is similar to prior. Tree-in-bud nodularity in the right upper lobe is new in the interval. Persistent tree-in-bud nodularity is seen in the left upper lobe. There is tree-in-bud nodularity in the posterior left lower lobe with areas of patchy and ultimately  confluent airspace consolidation at the left base. Upper Abdomen: Unremarkable. Musculoskeletal: Thoracolumbar scoliosis. Review of the MIP images confirms the above findings. IMPRESSION: 1. No CT evidence for acute pulmonary embolus. 2. Confluent airspace consolidation posterior left lower lobe suggests pneumonia. 3. Scattered areas of tree-in-bud opacity in both upper lobes and central left lower lobe suggesting atypical infection. 4. Mediastinal and borderline left hilar lymphadenopathy, presumably reactive. Electronically Signed   By: Misty Stanley M.D.   On: 08/18/2017 20:27    EKG:   Orders placed or performed during the hospital encounter of 08/18/17  . EKG 12-Lead  . EKG 12-Lead  . ED EKG  . ED EKG    ASSESSMENT AND PLAN:   83 y/o female with PMH of COPD/asthma, hypertension and neurology at comes from home secondary to worsening shortness of breath and noted to have community-acquired pneumonia  1. CAP- with hypoxia - blood cultures pending - continue rocephin and azithromycin - on 2l O2- wean as tolerated - cough meds -CT angiogram with no evidence of pulmonary embolus but showing left lower lobe infiltrate.  2. Afib- rate controlled, on metoprolol - off tele, on eliquis for anti coagulation  3. hypoakalemia- being replaced, check magnesium level  4. COPD- continue dulera and nebs scheduled for now  no indication for systemic steroids at this time  5. DVT prophylaxis- on eliquis   Physical Therapy consulted Called and left voice mail for son   All the records are reviewed and case discussed with Care Management/Social Workerr. Management plans discussed with the patient, family and they are in agreement.  CODE STATUS: Full Code  TOTAL TIME TAKING CARE OF THIS PATIENT: 38 minutes.   POSSIBLE D/C IN 1-2 DAYS, DEPENDING ON CLINICAL CONDITION.   Gladstone Lighter M.D on 08/19/2017 at 2:14 PM  Between 7am to 6pm - Pager - [phone]  After 6pm go to  [email or web address] - password [email or web address] [facility]  Office  [phone]  CC: Primary care physician; Madelyn Brunner, MD

## 2017-08-19 NOTE — Evaluation (Signed)
Physical Therapy Evaluation Patient Details Name: Janice Hoffman MRN: [id number] DOB: 25-Sep-1925 Today's Date: 08/19/2017   History of Present Illness  Janice Hoffman  is a 83 y.o. female presenting with left-sided chest pain, shortness of breath, found to be tachycardic, tachypneic in the emergency room, noted pneumonia CT of the chest, white count 17,000, patient evaluated in the emergency room in no apparent distress, resting comfortably in bed, no family at bedside, patient is a poor historian, patient denies chest pain/ever having chest pain, patient will be admitted for acute community-acquired right lower lobe pneumonia and A. fib with RVR. At time of PT evaluation pt is confused intermittently regarding situation. Unclear accuracy of history. No family present. History borrowed from medical record. Per RN message was left for son and has not received a return call  Clinical Impression  Pt admitted with above diagnosis. Pt currently with functional limitations due to the deficits listed below (see PT Problem List).  Pt with poor safety awareness and confusion regarding situation during evaluation. She requires minA+1 for transfers due to instability. She is unsteady with ambulation holding onto IV pole for support and HHA from therapist. Pt with short shuffling steps. She attempts to reach for walls and counters to stabilize. VSS with ambulation with SaO2 around 94% on room air with ambulation. She would benefit from SNF placement at discharge in order to facilitate safe return home. Pt seems to believe that her son will be able to care for her at his home. If this is the case she will need 24/7 support and HH PT. Pt will benefit from PT services to address deficits in strength, balance, and mobility in order to return to full function at home.      Follow Up Recommendations SNF    Equipment Recommendations  Other (comment)(Unable to determine at this time due to confusion)    Recommendations  for Other Services       Precautions / Restrictions Precautions Precautions: Fall Restrictions Weight Bearing Restrictions: No      Mobility  Bed Mobility               General bed mobility comments: Pt received and left sitting upright at EOB  Transfers Overall transfer level: Needs assistance Equipment used: 1 person hand held assist Transfers: Sit to/from Stand Sit to Stand: Min assist         General transfer comment: Pt is slightly unsteady with transfers requiring minA+1 for balance. Pt holds onto IV pole to stabilize  Ambulation/Gait Ambulation/Gait assistance: Min assist Ambulation Distance (Feet): 80 Feet Assistive device: 1 person hand held assist(IV pole) Gait Pattern/deviations: Decreased step length - right;Decreased step length - left Gait velocity: Decreased Gait velocity interpretation: Below normal speed for age/gender General Gait Details: Pt unsteady with ambulation holding onto IV pole for support and HHA from therapist. Pt with short shuffling steps. She attempts to reach for walls and counters to stabilize. VSS with ambulation with SaO2 around 94% on room air with ambulation  Stairs            Wheelchair Mobility    Modified Rankin (Stroke Patients Only)       Balance Overall balance assessment: Needs assistance Sitting-balance support: No upper extremity supported Sitting balance-Leahy Scale: Good     Standing balance support: Bilateral upper extremity supported Standing balance-Leahy Scale: Fair Standing balance comment: Able to stabilize with bilateral UE support in standing  Pertinent Vitals/Pain Pain Assessment: No/denies pain    Home Living Family/patient expects to be discharged to:: Private residence Living Arrangements: Alone Available Help at Discharge: Family Type of Home: House Home Access: Stairs to enter Entrance Stairs-Rails: Chemical engineer of  Steps: 5 Home Layout: One level Home Equipment: Environmental consultant - 2 wheels;Cane - single point Additional Comments: Pt lives alone and able to perform ADLS however son is available for help.     Prior Function Level of Independence: Independent with assistive device(s)         Comments: Pt reports able to perform all ADLs independently with [facility] for amb within home and RW for amb outside home.      Hand Dominance   Dominant Hand: Right    Extremity/Trunk Assessment   Upper Extremity Assessment Upper Extremity Assessment: Generalized weakness    Lower Extremity Assessment Lower Extremity Assessment: Generalized weakness       Communication   Communication: HOH  Cognition Arousal/Alertness: Awake/alert Behavior During Therapy: WFL for tasks assessed/performed Overall Cognitive Status: No family/caregiver present to determine baseline cognitive functioning                                 General Comments: Pt is AOx3, appears disoriented to situation. Occasionally irritable and has difficulty answering questions regarding home environment      General Comments      Exercises     Assessment/Plan    PT Assessment Patient needs continued PT services  PT Problem List Decreased strength;Decreased range of motion;Decreased activity tolerance;Decreased balance;Decreased mobility;Decreased cognition;Decreased knowledge of use of DME;Decreased safety awareness       PT Treatment Interventions DME instruction;Gait training;Stair training;Functional mobility training;Therapeutic activities;Therapeutic exercise;Balance training;Cognitive remediation;Patient/family education    PT Goals (Current goals can be found in the Care Plan section)  Acute Rehab PT Goals Patient Stated Goal: Return to prior level of function at home PT Goal Formulation: With patient Time For Goal Achievement: 09/02/17 Potential to Achieve Goals: Good    Frequency Min 2X/week   Barriers to  discharge Decreased caregiver support Lives alone    Co-evaluation               AM-PAC PT "6 Clicks" Daily Activity  Outcome Measure Difficulty turning over in bed (including adjusting bedclothes, sheets and blankets)?: A Little Difficulty moving from lying on back to sitting on the side of the bed? : A Little Difficulty sitting down on and standing up from a chair with arms (e.g., wheelchair, bedside commode, etc,.)?: Unable Help needed moving to and from a bed to chair (including a wheelchair)?: A Little Help needed walking in hospital room?: A Little Help needed climbing 3-5 steps with a railing? : A Lot 6 Click Score: 15    End of Session Equipment Utilized During Treatment: Gait belt Activity Tolerance: Patient tolerated treatment well Patient left: in bed;with call bell/phone within reach;with bed alarm set Nurse Communication: Mobility status PT Visit Diagnosis: Unsteadiness on feet (R26.81);Muscle weakness (generalized) (M62.81)    Time: 1324-4010 PT Time Calculation (min) (ACUTE ONLY): 13 min   Charges:   PT Evaluation $PT Eval Low Complexity: 1 Low     PT G Codes:   PT G-Codes **NOT FOR INPATIENT CLASS** Functional Assessment Tool Used: AM-PAC 6 Clicks Basic Mobility Functional Limitation: Mobility: Walking and moving around Mobility: Walking and Moving Around Current Status ([id number]): At least 40 percent but less than 60 percent  impaired, limited or restricted Mobility: Walking and Moving Around Goal Status [phone]): At least 1 percent but less than 20 percent impaired, limited or restricted    Phillips Grout PT, DPT    Huprich,Jason 08/19/2017, 5:24 PM

## 2017-08-19 NOTE — Plan of Care (Signed)
Pt is progressing. Ambulated to restroom and around hall with PT.

## 2017-08-20 ENCOUNTER — Inpatient Hospital Stay: Payer: Medicare Other

## 2017-08-20 LAB — BASIC METABOLIC PANEL
Anion gap: 8 (ref 5–15)
BUN: 12 mg/dL (ref 6–20)
CHLORIDE: 100 mmol/L — AB (ref 101–111)
CO2: 23 mmol/L (ref 22–32)
CREATININE: 0.38 mg/dL — AB (ref 0.44–1.00)
Calcium: 8 mg/dL — ABNORMAL LOW (ref 8.9–10.3)
Glucose, Bld: 102 mg/dL — ABNORMAL HIGH (ref 65–99)
POTASSIUM: 3.8 mmol/L (ref 3.5–5.1)
SODIUM: 131 mmol/L — AB (ref 135–145)

## 2017-08-20 MED ORDER — AZITHROMYCIN 250 MG PO TABS
500.0000 mg | ORAL_TABLET | Freq: Every day | ORAL | Status: DC
  Administered 2017-08-20: 500 mg via ORAL
  Filled 2017-08-20: qty 2

## 2017-08-20 MED ORDER — FUROSEMIDE 20 MG PO TABS
20.0000 mg | ORAL_TABLET | Freq: Once | ORAL | Status: AC
  Administered 2017-08-20: 20 mg via ORAL
  Filled 2017-08-20: qty 1

## 2017-08-20 MED ORDER — LEVOFLOXACIN 500 MG PO TABS: 500.0000 mg | ORAL_TABLET | Freq: Every day | ORAL | 0 refills | Status: DC

## 2017-08-20 MED ORDER — PREDNISONE 10 MG (21) PO TBPK: ORAL_TABLET | ORAL | 0 refills | Status: DC

## 2017-08-20 MED ORDER — METOPROLOL TARTRATE 25 MG PO TABS
25.0000 mg | ORAL_TABLET | Freq: Two times a day (BID) | ORAL | 2 refills | Status: DC
Start: 2017-08-20 — End: 2023-07-01

## 2017-08-20 MED ORDER — PREDNISONE 50 MG PO TABS
50.0000 mg | ORAL_TABLET | Freq: Every day | ORAL | Status: DC
  Administered 2017-08-20 – 2017-08-21 (×2): 50 mg via ORAL
  Filled 2017-08-20 (×2): qty 1

## 2017-08-20 MED ORDER — GUAIFENESIN ER 600 MG PO TB12: 600.0000 mg | ORAL_TABLET | Freq: Two times a day (BID) | ORAL | 0 refills | Status: DC

## 2017-08-20 NOTE — Progress Notes (Signed)
[facility] [place] NAME: Janice Hoffman    MR#:  [id number]  DATE OF BIRTH:  [birth year]  SUBJECTIVE:  CHIEF COMPLAINT:   Chief Complaint  Patient presents with  . Chest Pain   - much better mentation today - breathing better, off o2, but wheezing today  REVIEW OF SYSTEMS:  Review of Systems  Constitutional: Positive for malaise/fatigue. Negative for chills and fever.  HENT: Negative for congestion, ear discharge, hearing loss and nosebleeds.   Eyes: Negative for blurred vision and double vision.  Respiratory: Positive for shortness of breath and wheezing. Negative for cough.   Cardiovascular: Negative for chest pain, palpitations and leg swelling.  Gastrointestinal: Negative for abdominal pain, constipation, diarrhea, nausea and vomiting.  Genitourinary: Negative for dysuria.  Musculoskeletal: Negative for myalgias.  Neurological: Negative for dizziness, speech change, focal weakness, seizures and headaches.  Psychiatric/Behavioral:       Confused    DRUG ALLERGIES:   Allergies  Allergen Reactions  . Nsaids Shortness Of Breath    Throat swelling, shortness of breath  . Penicillins     Has patient had a PCN reaction causing immediate rash, facial/tongue/throat swelling, SOB or lightheadedness with hypotension: Unknown Has patient had a PCN reaction causing severe rash involving mucus membranes or skin necrosis: Unknown Has patient had a PCN reaction that required hospitalization: Unknown Has patient had a PCN reaction occurring within the last 10 years: Unknown If all of the above answers are "NO", then may proceed with Cephalosporin use.   . Promethazine Hcl     VITALS:  Blood pressure (!) 151/70, pulse 81, temperature 98.3 F (36.8 C), temperature source Oral, resp. rate 18, height 5\' 3"  (1.6 m), weight 55.3 kg (122 lb), SpO2 94 %.  PHYSICAL EXAMINATION:  Physical Exam  GENERAL:  83 y.o.-year-old patient lying in the  bed with no acute distress.  EYES: Pupils equal, round, reactive to light and accommodation. No scleral icterus. Extraocular muscles intact.  HEENT: Head atraumatic, normocephalic. Oropharynx and nasopharynx clear.  NECK:  Supple, no jugular venous distention. No thyroid enlargement, no tenderness.  LUNGS: Normal breath sounds bilaterally, scattered wheezing today, no rales,rhonchi or crepitation. No use of accessory muscles of respiration. Decreased bibasilar breath sounds CARDIOVASCULAR: S1, S2 normal. No rubs, or gallops. 2/6 systolic murmur is present ABDOMEN: Soft, nontender, nondistended. Bowel sounds present. No organomegaly or mass.  EXTREMITIES: No pedal edema, cyanosis, or clubbing.  NEUROLOGIC: Cranial nerves II through XII are intact. Muscle strength 5/5 in all extremities. Sensation intact. Gait not checked.  PSYCHIATRIC: The patient is alert and oriented x 3. Some confusion SKIN: No obvious rash, lesion, or ulcer.    LABORATORY PANEL:   CBC Recent Labs  Lab 08/19/17 0515  WBC 13.8*  HGB 10.3*  HCT 30.7*  PLT 270   ------------------------------------------------------------------------------------------------------------------  Chemistries  Recent Labs  Lab 08/18/17 1906  08/19/17 1130 08/20/17 0425  NA 134*   < >  --  131*  K 3.6   < >  --  3.8  CL 97*   < >  --  100*  CO2 25   < >  --  23  GLUCOSE 123*   < >  --  102*  BUN 17   < >  --  12  CREATININE 0.45   < >  --  0.38*  CALCIUM 8.8*   < >  --  8.0*  MG  --   --  1.8  --  AST 82*  --   --   --   ALT 55*  --   --   --   ALKPHOS 88  --   --   --   BILITOT 1.1  --   --   --    < > = values in this interval not displayed.   ------------------------------------------------------------------------------------------------------------------  Cardiac Enzymes Recent Labs  Lab 08/19/17 1130  TROPONINI 0.03*    ------------------------------------------------------------------------------------------------------------------  RADIOLOGY:  Dg Chest 2 View  Result Date: 08/20/2017 CLINICAL DATA:  Pneumonia. EXAM: CHEST  2 VIEW COMPARISON:  CT scan and chest x-ray dated 08/18/2017 and chest x-ray dated 06/14/2017 FINDINGS: Focal infiltrate at the left lung base is unchanged as is a small patchy infiltrate at the right lung base laterally. The infiltrate in the anterior aspect of the right upper lobe appears improved on the lateral view. It is not appreciable on the AP view. Heart size and pulmonary vascularity are normal. Aortic atherosclerosis. No acute bone abnormality. Severe chronic lumbar scoliosis. IMPRESSION: 1. Persistent infiltrates at the lung bases, essentially unchanged. 2. Partial clearing of the right upper lobe infiltrate. 3. Aortic atherosclerosis. Electronically Signed   By: Lorriane Shire M.D.   On: 08/20/2017 10:36   Dg Chest 2 View  Result Date: 08/18/2017 CLINICAL DATA:  Left-sided chest pain and dyspnea at home today. EXAM: CHEST  2 VIEW COMPARISON:  06/14/2017 FINDINGS: Left base consolidation superimposed on chronic emphysematous and fibrotic changes. This probably represents pneumonia. Pulmonary vasculature is normal. No effusion. Hilar, mediastinal and cardiac contours are unremarkable and unchanged. Moderate hyperinflation. IMPRESSION: Left lower lobe pneumonia superimposed on COPD. Followup PA and lateral chest X-ray is recommended in 3-4 weeks following trial of antibiotic therapy to ensure resolution and exclude underlying malignancy. Electronically Signed   By: Andreas [place] M.D.   On: 08/18/2017 19:33   Ct Angio Chest Pe W And/or Wo Contrast  Result Date: 08/18/2017 CLINICAL DATA:  Left-sided chest pain and shortness of breath. EXAM: CT ANGIOGRAPHY CHEST WITH CONTRAST TECHNIQUE: Multidetector CT imaging of the chest was performed using the standard protocol during bolus  administration of intravenous contrast. Multiplanar CT image reconstructions and MIPs were obtained to evaluate the vascular anatomy. CONTRAST:  6mL ISOVUE-370 IOPAMIDOL (ISOVUE-370) INJECTION 76% COMPARISON:  03/15/2016 FINDINGS: Cardiovascular: The heart size is normal. No pericardial effusion. Coronary artery calcification is evident. Atherosclerotic calcification is noted in the wall of the thoracic aorta. No filling defect in the opacified pulmonary arteries to suggest the presence of an acute pulmonary embolus. Mediastinum/Nodes: 16 mm short axis subcarinal lymph node is associated with small lymph nodes in the left hilum. The esophagus has normal imaging features. There is no axillary lymphadenopathy. Asymmetric enlargement of the right thyroid lobe is stable with low-density left thyroid nodule similar to prior. Lungs/Pleura: Emphysema noted bilaterally. 7 mm nodule right apex is similar to prior. Tree-in-bud nodularity in the right upper lobe is new in the interval. Persistent tree-in-bud nodularity is seen in the left upper lobe. There is tree-in-bud nodularity in the posterior left lower lobe with areas of patchy and ultimately confluent airspace consolidation at the left base. Upper Abdomen: Unremarkable. Musculoskeletal: Thoracolumbar scoliosis. Review of the MIP images confirms the above findings. IMPRESSION: 1. No CT evidence for acute pulmonary embolus. 2. Confluent airspace consolidation posterior left lower lobe suggests pneumonia. 3. Scattered areas of tree-in-bud opacity in both upper lobes and central left lower lobe suggesting atypical infection. 4. Mediastinal and borderline left hilar lymphadenopathy, presumably reactive. Electronically  Signed   By: Misty Stanley M.D.   On: 08/18/2017 20:27    EKG:   Orders placed or performed during the hospital encounter of 08/18/17  . EKG 12-Lead  . EKG 12-Lead  . ED EKG  . ED EKG  . EKG    ASSESSMENT AND PLAN:   83 y/o female with PMH of  COPD/asthma, hypertension and neurology at comes from home secondary to worsening shortness of breath and noted to have community-acquired pneumonia  1. CAP- with hypoxia - blood cultures negative so far - continue rocephin and azithromycin - on 2l O2- wean to room air this morning - cough meds -CT angiogram with no evidence of pulmonary embolus but showing left lower lobe infiltrate.  2. Afib- rate controlled, on metoprolol - off tele, on eliquis for anti coagulation  3. hypoakalemia- replaced  4. COPD- with minimal exacerbation today. Started oral steroids. One dose of oral Lasix. -continue dulera and nebs scheduled for now -Remains on room air. Encourage ambulation  5. DVT prophylaxis- on eliquis   Physical Therapy consulted-recommended SNF and social worker consulted   All the records are reviewed and case discussed with Care Management/Social Workerr. Management plans discussed with the patient, family and they are in agreement.  CODE STATUS: Full Code  TOTAL TIME TAKING CARE OF THIS PATIENT: 38 minutes.   POSSIBLE D/C TOMORROW, DEPENDING ON CLINICAL CONDITION.   [place] Lighter M.D on 08/20/2017 at 3:28 PM  Between 7am to 6pm - Pager - [phone]  After 6pm go to [email or web address] - password [email or web address] [facility]  Office  [phone]  CC: Primary care physician; Madelyn Brunner, MD

## 2017-08-20 NOTE — Clinical Social Work Note (Signed)
Clinical Social Work Assessment  Patient Details  Name: Janice Hoffman MRN: [id number] Date of Birth: August 02, 1926  Date of referral:  08/20/17               Reason for consult:  Discharge Planning                Permission sought to share information with:    Permission granted to share information::     Name::        Agency::     Relationship::     Contact Information:     Housing/Transportation Living arrangements for the past 2 months:  Single Family Home Source of Information:  Adult Children Patient Interpreter Needed:  None Criminal Activity/Legal Involvement Pertinent to Current Situation/Hospitalization:  No - Comment as needed Significant Relationships:  Adult Children Lives with:  Self Do you feel safe going back to the place where you live?    Need for family participation in patient care:  Yes (Comment)  Care giving concerns:  Patient lives alone.   Social Worker assessment / plan:  Patient's son is planning to take patient to his home according to nursing. CSW spoke with patient's son this afternoon and explained role and purpose of call. Patient's son stated that he has decided to take patient home and have home health again. He stated that she had [facility] in the past and that he was very pleased with their services.   Employment status:  Retired Nurse, adult PT Recommendations:  [employer] / Referral to community resources:     Patient/Family's Response to care:  Patient's son expressed appreciation for CSW assistance.  Patient/Family's Understanding of and Emotional Response to Diagnosis, Current Treatment, and Prognosis:  Patient's son is a Restaurant manager, fast food and is very involved in his mother's care.  Emotional Assessment Appearance:  Appears stated age Attitude/Demeanor/Rapport:    Affect (typically observed):    Orientation:    Alcohol / Substance use:    Psych involvement (Current and /or in the  community):     Discharge Needs  Concerns to be addressed:  Care Coordination Readmission within the last 30 days:  No Current discharge risk:  None Barriers to Discharge:  No Barriers Identified   Shela Leff, LCSW 08/20/2017, 1:34 PM

## 2017-08-20 NOTE — NC FL2 (Signed)
[place] LEVEL OF CARE SCREENING TOOL     IDENTIFICATION  Patient Name: Janice Hoffman Birthdate: [birth year] Sex: female Admission Date (Current Location): 08/18/2017  [place] and [place] Number:  Engineering geologist and Address:  [facility], [address] [place], [place] [address]      Provider Number: [id number]  Attending Physician Name and Address:  [place] Lighter, MD  Relative Name and Phone Number:       Current Level of Care: Hospital Recommended Level of Care: [place] Prior Approval Number:    Date Approved/Denied:   PASRR Number: [id number]  Discharge Plan: SNF    Current Diagnoses: Patient Active Problem List   Diagnosis Date Noted  . CAP (community acquired pneumonia) 08/18/2017  . New onset a-fib ([place]) 06/15/2017  . Near syncope 05/31/2017  . COPD with acute exacerbation ([place]) 01/05/2017  . Essential hypertension, malignant 01/05/2017  . Dysphagia 01/05/2017  . COPD exacerbation ([place]) 03/15/2016  . 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 ELEVATED BLOOD PRESSURE 05/12/2008  . HERPES ZOSTER 04/26/2008  . B12 DEFICIENCY 04/26/2008  . CONSTIPATION 04/26/2008  . LEG EDEMA, BILATERAL 04/26/2008    Orientation RESPIRATION BLADDER Height & Weight     Self, Place  Normal Continent Weight: 122 lb (55.3 kg) Height:  5\' 3"  (160 cm)  BEHAVIORAL SYMPTOMS/MOOD NEUROLOGICAL BOWEL NUTRITION STATUS  (none) (none) Continent Diet(heart healthy)  AMBULATORY STATUS COMMUNICATION OF NEEDS Skin   Limited Assist Verbally Normal                       Personal Care Assistance Level of Assistance  Bathing, Feeding, Dressing Bathing Assistance: Limited assistance Feeding assistance: Limited assistance Dressing Assistance: Limited assistance     Functional Limitations Info  (no issues)          SPECIAL CARE FACTORS FREQUENCY  PT (By licensed PT)                    Contractures  Contractures Info: Not present    Additional Factors Info                  Current Medications (08/20/2017):  This is the current hospital active medication list Current Facility-Administered Medications  Medication Dose Route Frequency Provider Last Rate Last Dose  . acetaminophen (TYLENOL) tablet 650 mg  650 mg Oral Q6H PRN Salary, Holly Bodily D, MD   650 mg at 08/19/17 2841   Or  . acetaminophen (TYLENOL) suppository 650 mg  650 mg Rectal Q6H PRN Salary, Montell D, MD      . apixaban (ELIQUIS) tablet 2.5 mg  2.5 mg Oral BID Salary, Montell D, MD   2.5 mg at 08/20/17 1038  . aspirin EC tablet 81 mg  81 mg Oral Daily Salary, Montell D, MD   81 mg at 08/20/17 1038  . azithromycin (ZITHROMAX) 500 mg in dextrose 5 % 250 mL IVPB  500 mg Intravenous Q24H [place] Harms, MD   Stopped at 08/19/17 1825  . cefTRIAXone (ROCEPHIN) 1 g in dextrose 5 % 50 mL IVPB  1 g Intravenous Q24H [place] Harms, MD   Stopped at 08/19/17 2338  . guaiFENesin (MUCINEX) 12 hr tablet 600 mg  600 mg Oral BID Salary, Montell D, MD   600 mg at 08/20/17 1037  . HYDROcodone-acetaminophen (NORCO/VICODIN) 5-325 MG per tablet 1-2 tablet  1-2 tablet Oral Q4H PRN Salary, Avel Peace, MD      .  ipratropium-albuterol (DUONEB) 0.5-2.5 (3) MG/3ML nebulizer solution 3 mL  3 mL Nebulization Q6H Salary, Montell D, MD   3 mL at 08/20/17 0748  . metoprolol tartrate (LOPRESSOR) tablet 25 mg  25 mg Oral BID [place] Lighter, MD   25 mg at 08/20/17 1038  . mometasone-formoterol (DULERA) 200-5 MCG/ACT inhaler 2 puff  2 puff Inhalation BID Loney Hering D, MD   2 puff at 08/20/17 [phone]  . multivitamin with minerals tablet 1 tablet  1 tablet Oral Daily Salary, Montell D, MD   1 tablet at 08/20/17 1037  . ondansetron (ZOFRAN) tablet 4 mg  4 mg Oral Q6H PRN Salary, Montell D, MD       Or  . ondansetron (ZOFRAN) injection 4 mg  4 mg Intravenous Q6H PRN Salary, Montell D, MD      . polyethylene glycol (MIRALAX / GLYCOLAX) packet 17 g  17 g  Oral Daily PRN Salary, Montell D, MD      . predniSONE (DELTASONE) tablet 50 mg  50 mg Oral Daily [place] Lighter, MD   50 mg at 08/20/17 1038     Discharge Medications: Please see discharge summary for a list of discharge medications.  Relevant Imaging Results:  Relevant Lab Results:   Additional Information ss: [id number]  Shela Leff, LCSW

## 2017-08-20 NOTE — Plan of Care (Signed)
Pt is progressing. Pt was weaned to RA during the night. Pt had some wheezing this am. Tressia Miners was notified when rounding.

## 2017-08-20 NOTE — Care Management (Signed)
Patient admitted with CAP.  At discharge patient will go to sons home.  Address : [address], [place], [place].  PCP was Dr. Gilford Rile, however he has retired, and patient has been seeing the PA.  Patient has RW, BSC, and cane in the home.  PT has assessed patient and recommend SNF, however son wishes for patient to return home with home health services.  Patient has had [facility] home health previously and would like to use them again if possible. RNCM spoke with Adonis Brook at [place].  They do not have nursing services available in [place].  PT could follow patient and provide medication management.    Due to frequent admissions, age, dx of Pna, and recommendation for patient to discharge to SNF and choosing to go home I have reached out to determine if patient would be approved for [facility] services.  RNCM following, if patient approved will submit this option to son.

## 2017-08-20 NOTE — Progress Notes (Signed)
Physical Therapy Treatment Patient Details Name: Janice Hoffman MRN: [id number] DOB: 1925/12/27 Today's Date: 08/20/2017    History of Present Illness Janice Hoffman  is a 83 y.o. female presenting with left-sided chest pain, shortness of breath, found to be tachycardic, tachypneic in the emergency room, noted pneumonia CT of the chest, white count 17,000, patient evaluated in the emergency room in no apparent distress, resting comfortably in bed, no family at bedside, patient is a poor historian, patient denies chest pain/ever having chest pain, patient will be admitted for acute community-acquired right lower lobe pneumonia and A. fib with RVR. At time of PT evaluation pt is confused intermittently regarding situation. Unclear accuracy of history. No family present. History borrowed from medical record. Per RN message was left for son and has not received a return call    PT Comments    Pt is making gradual progress towards goals with pt agreeable to there-ex. Still unsteady with only HHA, however refuses to use the RW for mobility. Still recommending SNF for safety concerns.   Follow Up Recommendations  SNF     Equipment Recommendations  Other (comment)    Recommendations for Other Services       Precautions / Restrictions Precautions Precautions: Fall Restrictions Weight Bearing Restrictions: No    Mobility  Bed Mobility Overal bed mobility: Modified Independent             General bed mobility comments: able to get to EOB safely, reports she doesn't want help from therapy  Transfers Overall transfer level: Needs assistance Equipment used: 1 person hand held assist Transfers: Sit to/from Stand Sit to Stand: Min assist         General transfer comment: Reaches out for therapist hand during transfer. Upright posture noted, however unsteady  Ambulation/Gait Ambulation/Gait assistance: Min assist Ambulation Distance (Feet): 3 Feet Assistive device: 1 person hand  held assist Gait Pattern/deviations: Decreased step length - right;Decreased step length - left     General Gait Details: only wanted to ambulate to recliner. Unsteady reaching out for furniture. Refuses RW when asked to use.   Stairs            Wheelchair Mobility    Modified Rankin (Stroke Patients Only)       Balance                                            Cognition Arousal/Alertness: Awake/alert Behavior During Therapy: WFL for tasks assessed/performed Overall Cognitive Status: No family/caregiver present to determine baseline cognitive functioning                                 General Comments: Pt is AOx3, appears disoriented to situation. Occasionally irritable and has difficulty answering questions regarding home environment      Exercises Other Exercises Other Exercises: Pt agreeable to seated ther-ex on B LE including LAQ, alt. marching, SAQ, and hip abd/add. All ther-ex performed x 10 reps with supervision.    General Comments        Pertinent Vitals/Pain Pain Assessment: No/denies pain    Home Living                      Prior Function            PT Goals (current  goals can now be found in the care plan section) Acute Rehab PT Goals Patient Stated Goal: Return to prior level of function at home PT Goal Formulation: With patient Time For Goal Achievement: 09/02/17 Potential to Achieve Goals: Good Progress towards PT goals: Progressing toward goals    Frequency    Min 2X/week      PT Plan Current plan remains appropriate    Co-evaluation              AM-PAC PT "6 Clicks" Daily Activity  Outcome Measure  Difficulty turning over in bed (including adjusting bedclothes, sheets and blankets)?: None Difficulty moving from lying on back to sitting on the side of the bed? : A Little Difficulty sitting down on and standing up from a chair with arms (e.g., wheelchair, bedside commode,  etc,.)?: Unable Help needed moving to and from a bed to chair (including a wheelchair)?: A Little Help needed walking in hospital room?: A Little Help needed climbing 3-5 steps with a railing? : A Lot 6 Click Score: 16    End of Session Equipment Utilized During Treatment: Gait belt Activity Tolerance: Patient tolerated treatment well Patient left: in chair;with chair alarm set Nurse Communication: Mobility status PT Visit Diagnosis: Unsteadiness on feet (R26.81);Muscle weakness (generalized) (M62.81)     Time: 1536-1600 PT Time Calculation (min) (ACUTE ONLY): 24 min  Charges:  $Gait Training: 8-22 mins $Therapeutic Exercise: 8-22 mins                    G Codes:  Functional Assessment Tool Used: AM-PAC 6 Clicks Basic Mobility Functional Limitation: Mobility: Walking and moving around Mobility: Walking and Moving Around Current Status (G3151): At least 40 percent but less than 60 percent impaired, limited or restricted Mobility: Walking and Moving Around Goal Status [phone]): At least 1 percent but less than 20 percent impaired, limited or restricted    Greggory Stallion, PT, DPT [phone]    Terik Haughey 08/20/2017, 4:14 PM

## 2017-08-21 LAB — BASIC METABOLIC PANEL
Anion gap: 7 (ref 5–15)
BUN: 10 mg/dL (ref 6–20)
CALCIUM: 8.5 mg/dL — AB (ref 8.9–10.3)
CO2: 25 mmol/L (ref 22–32)
CREATININE: 0.42 mg/dL — AB (ref 0.44–1.00)
Chloride: 103 mmol/L (ref 101–111)
GFR calc Af Amer: >60 mL/min (ref >60)
GFR calc non Af Amer: >60 mL/min (ref >60)
GLUCOSE: 115 mg/dL — AB (ref 65–99)
Potassium: 3.8 mmol/L (ref 3.5–5.1)
Sodium: 135 mmol/L (ref 135–145)

## 2017-08-21 MED ORDER — AZITHROMYCIN 250 MG PO TABS: 250.0000 mg | ORAL_TABLET | Freq: Every day | ORAL | Status: DC

## 2017-08-21 MED ORDER — LEVOFLOXACIN 500 MG PO TABS
500.0000 mg | ORAL_TABLET | Freq: Every day | ORAL | Status: DC
  Filled 2017-08-21: qty 1

## 2017-08-21 MED ORDER — CEFUROXIME AXETIL 500 MG PO TABS
500.0000 mg | ORAL_TABLET | Freq: Two times a day (BID) | ORAL | Status: DC
  Filled 2017-08-21: qty 1

## 2017-08-21 NOTE — Progress Notes (Signed)
Physical Therapy Treatment Patient Details Name: TEARIA GIBBS MRN: [id number] DOB: 26-Apr-1926 Today's Date: 08/21/2017    History of Present Illness Analia Zuk  is a 83 y.o. female presenting with left-sided chest pain, shortness of breath, found to be tachycardic, tachypneic in the emergency room, noted pneumonia CT of the chest, white count 17,000, patient evaluated in the emergency room in no apparent distress, resting comfortably in bed, no family at bedside, patient is a poor historian, patient denies chest pain/ever having chest pain, patient will be admitted for acute community-acquired right lower lobe pneumonia and A. fib with RVR. At time of PT evaluation pt is confused intermittently regarding situation. Unclear accuracy of history. No family present. History borrowed from medical record. Per RN message was left for son and has not received a return call    PT Comments    Pt declines initial offer but agreed later in am.  Stood with min guard x 1 and was able to ambulate 160' x 1 with walker and min guard.  Upon return to room, pt requests inhaler.  Primary nurse notified.  O2 sats checked and initially read in low 80's but increased to mid to high 90's shortly after.  Stayed to monitor O2 sats and at times she does dip to high 80's with talking but will increase back to 90's when she stops talking.  Pt was then taken on an additional ambulation of 100' and checked sats upon return and read in mid to high 90's.  Pt on room air at all times.  Primary nurse in with pt and discussing medications addressing.  Pt reported she had a walker at home but she does not use it.  Pt encouraged to use walker at all times until strength and endurance increase.  She stated she does live alone but is planning to stay with son and daughter in law upon discharge.   Follow Up Recommendations  Home health PT;Supervision for mobility/OOB     Equipment Recommendations  Rolling walker with 5" wheels     Recommendations for Other Services       Precautions / Restrictions Precautions Precautions: Fall Restrictions Weight Bearing Restrictions: No    Mobility  Bed Mobility               General bed mobility comments: in recliner  Transfers   Equipment used: Rolling walker (2 wheeled) Transfers: Sit to/from Stand Sit to Stand: Min guard            Ambulation/Gait Ambulation/Gait assistance: Min guard Ambulation Distance (Feet): 160 Feet Assistive device: Rolling walker (2 wheeled) Gait Pattern/deviations: Step-through pattern   Gait velocity interpretation: Below normal speed for age/gender General Gait Details: 160, 100 - agrees to walker today   Stairs            Wheelchair Mobility    Modified Rankin (Stroke Patients Only)       Balance Overall balance assessment: Needs assistance Sitting-balance support: No upper extremity supported Sitting balance-Leahy Scale: Good     Standing balance support: Bilateral upper extremity supported Standing balance-Leahy Scale: Fair Standing balance comment: Able to stabilize with bilateral UE support in standing                            Cognition Arousal/Alertness: Awake/alert Behavior During Therapy: WFL for tasks assessed/performed Overall Cognitive Status: Within Functional Limits for tasks assessed  Exercises Other Exercises Other Exercises: O2 sat monitoring    General Comments        Pertinent Vitals/Pain Pain Assessment: No/denies pain    Home Living                      Prior Function            PT Goals (current goals can now be found in the care plan section) Progress towards PT goals: Progressing toward goals    Frequency    Min 2X/week      PT Plan Discharge plan needs to be updated    Co-evaluation              AM-PAC PT "6 Clicks" Daily Activity  Outcome Measure  Difficulty  turning over in bed (including adjusting bedclothes, sheets and blankets)?: None Difficulty moving from lying on back to sitting on the side of the bed? : A Little Difficulty sitting down on and standing up from a chair with arms (e.g., wheelchair, bedside commode, etc,.)?: A Little Help needed moving to and from a bed to chair (including a wheelchair)?: A Little Help needed walking in hospital room?: A Little Help needed climbing 3-5 steps with a railing? : A Little 6 Click Score: 19    End of Session Equipment Utilized During Treatment: Gait belt Activity Tolerance: Patient tolerated treatment well Patient left: in chair;with chair alarm set;with nursing/sitter in room;with call bell/phone within reach Nurse Communication: Mobility status;Other (comment)       Time: 9518-8416 PT Time Calculation (min) (ACUTE ONLY): 23 min  Charges:  $Gait Training: 8-22 mins $Therapeutic Activity: 8-22 mins                    G Codes:       Chesley Noon, PTA 08/21/17, 10:17 AM

## 2017-08-21 NOTE — Progress Notes (Signed)
Discharge instructions reviewed with the patients son, Dr Rock Nephew.  Patient sent out via wheelchair to sons car

## 2017-08-21 NOTE — Care Management (Signed)
Patient to discharge home today.  Approved for [facility] services.  Patient's son in agreement for services through [facility].  Corene Cornea with [facility] notified of referral.  Patient to discharge on COPD/PNA home health medication protocol.  RNCM signing off.

## 2017-08-21 NOTE — Discharge Summary (Signed)
[facility] [place] NAME: Janice Hoffman    MR#:  [id number]  DATE OF BIRTH:  1926-03-14  DATE OF ADMISSION:  08/18/2017 ADMITTING PHYSICIAN: Gorden Harms, MD  DATE OF DISCHARGE: 08/21/2017  PRIMARY CARE PHYSICIAN: Baxter Hire, MD    ADMISSION DIAGNOSIS:  Chronic obstructive pulmonary disease with acute exacerbation ([place]) [J44.1] Community acquired pneumonia of left lower lobe of lung ([place]) [J18.1]  DISCHARGE DIAGNOSIS:  Left LL pneumonia Chronic COPD--acute on chronic exacerbation SECONDARY DIAGNOSIS:   Past Medical History:  Diagnosis Date  . Asthma   . COPD (chronic obstructive pulmonary disease) ([place])   . Herpes zoster   . Hypertension   . Neuralgia     HOSPITAL COURSE:   83 y/o female with PMH of COPD/asthma, hypertension and neurology at comes from home secondary to worsening shortness of breath and noted to have community-acquired pneumonia  1. CAP- with hypoxia - blood cultures negative so far - continue rocephin and azithromycin---now changed to po Levaquin - on 2l O2- weaned to room air this morning 93-100% on RA - cough meds prn -CT angiogram with no evidence of pulmonary embolus but showing left lower lobe infiltrate.  2. Afib- rate controlled, on metoprolol - off tele, on eliquis for anti coagulation  3. hypoakalemia- replaced  4. COPD- with minimal exacerbation today. Started oral steroids. One dose of oral Lasix. -continue dulera and nebs scheduled for now -Remains on room air. Encourage ambulation  5. DVT prophylaxis- on eliquis   Physical Therapy consulted-recommended SNF and Son Dr Rock Nephew would prefer pt going home. Will arrange HHPT D/w son on the phone  D/c home  CONSULTS OBTAINED:    DRUG ALLERGIES:   Allergies  Allergen Reactions  . Nsaids Shortness Of Breath    Throat swelling, shortness of breath  . Penicillins     Has patient had a PCN reaction causing  immediate rash, facial/tongue/throat swelling, SOB or lightheadedness with hypotension: Unknown Has patient had a PCN reaction causing severe rash involving mucus membranes or skin necrosis: Unknown Has patient had a PCN reaction that required hospitalization: Unknown Has patient had a PCN reaction occurring within the last 10 years: Unknown If all of the above answers are "NO", then may proceed with Cephalosporin use.   . Promethazine Hcl     DISCHARGE MEDICATIONS:   Allergies as of 08/21/2017      Reactions   Nsaids Shortness Of Breath   Throat swelling, shortness of breath   Penicillins    Has patient had a PCN reaction causing immediate rash, facial/tongue/throat swelling, SOB or lightheadedness with hypotension: Unknown Has patient had a PCN reaction causing severe rash involving mucus membranes or skin necrosis: Unknown Has patient had a PCN reaction that required hospitalization: Unknown Has patient had a PCN reaction occurring within the last 10 years: Unknown If all of the above answers are "NO", then may proceed with Cephalosporin use.   Promethazine Hcl       Medication List    STOP taking these medications   amLODipine 10 MG tablet Commonly known as:  NORVASC   doxycycline 100 MG tablet Commonly known as:  VIBRA-TABS   mupirocin ointment 2 % Commonly known as:  BACTROBAN     TAKE these medications   acetaminophen 325 MG tablet Commonly known as:  TYLENOL Take 650 mg by mouth every 6 (six) hours as needed.   albuterol (5 MG/ML) 0.5% nebulizer solution Commonly known as:  PROVENTIL Take 2.5 mg by nebulization every 6 (six) hours as needed for wheezing or shortness of breath.   albuterol 108 (90 Base) MCG/ACT inhaler Commonly known as:  PROVENTIL HFA;VENTOLIN HFA Inhale 2 puffs into the lungs every 6 (six) hours as needed for wheezing or shortness of breath.   apixaban 2.5 MG Tabs tablet Commonly known as:  ELIQUIS Take 1 tablet (2.5 mg total) by mouth 2  (two) times daily.   docusate sodium 100 MG capsule Commonly known as:  COLACE Take 1 capsule (100 mg total) by mouth 2 (two) times daily as needed for mild constipation.   feeding supplement (ENSURE ENLIVE) Liqd Take 237 mLs by mouth 2 (two) times daily between meals.   Fluticasone-Salmeterol 500-50 MCG/DOSE Aepb Commonly known as:  ADVAIR Inhale 1 puff into the lungs 2 (two) times daily.   guaiFENesin 600 MG 12 hr tablet Commonly known as:  MUCINEX Take 1 tablet (600 mg total) by mouth 2 (two) times daily.   ipratropium-albuterol 0.5-2.5 (3) MG/3ML Soln Commonly known as:  DUONEB Take 3 mLs by nebulization every 6 (six) hours as needed.   levofloxacin 500 MG tablet Commonly known as:  LEVAQUIN Take 1 tablet (500 mg total) by mouth daily.   meclizine 12.5 MG tablet Commonly known as:  ANTIVERT Take 12.5 mg by mouth 3 (three) times daily as needed for dizziness.   metoprolol tartrate 25 MG tablet Commonly known as:  LOPRESSOR Take 1 tablet (25 mg total) by mouth 2 (two) times daily.   predniSONE 10 MG (21) Tbpk tablet Commonly known as:  STERAPRED UNI-PAK 21 TAB 6 tabs PO x 1 day 5 tabs PO x 1 day 4 tabs PO x 1 day 3 tabs PO x 1 day 2 tabs PO x 1 day 1 tab PO x 1 day and stop   senna 8.6 MG Tabs tablet Commonly known as:  SENOKOT Take 1 tablet by mouth daily as needed for mild constipation.   tiotropium 18 MCG inhalation capsule Commonly known as:  SPIRIVA Place 1 capsule (18 mcg total) into inhaler and inhale daily.   traMADol 50 MG tablet Commonly known as:  ULTRAM Take 50 mg by mouth every 6 (six) hours as needed. Reported on 03/14/2016       If you experience worsening of your admission symptoms, develop shortness of breath, life threatening emergency, suicidal or homicidal thoughts you must seek medical attention immediately by calling 911 or calling your MD immediately  if symptoms less severe.  You Must read complete instructions/literature along with  all the possible adverse reactions/side effects for all the Medicines you take and that have been prescribed to you. Take any new Medicines after you have completely understood and accept all the possible adverse reactions/side effects.   Please note  You were cared for by a hospitalist during your hospital stay. If you have any questions about your discharge medications or the care you received while you were in the hospital after you are discharged, you can call the unit and asked to speak with the hospitalist on call if the hospitalist that took care of you is not available. Once you are discharged, your primary care physician will handle any further medical issues. Please note that NO REFILLS for any discharge medications will be authorized once you are discharged, as it is imperative that you return to your primary care physician (or establish a relationship with a primary care physician if you do not have one) for your aftercare needs  so that they can reassess your need for medications and monitor your lab values. Today   SUBJECTIVE   Doing well  VITAL SIGNS:  Blood pressure 136/80, pulse 85, temperature 98.4 F (36.9 C), resp. rate 18, height 5\' 3"  (1.6 m), weight 55.3 kg (122 lb), SpO2 93 %.  I/O:    Intake/Output Summary (Last 24 hours) at 08/21/2017 0857 Last data filed at 08/21/2017 0700 Gross per 24 hour  Intake 290 ml  Output 1350 ml  Net -1060 ml    PHYSICAL EXAMINATION:  GENERAL:  83 y.o.-year-old patient lying in the bed with no acute distress.  EYES: Pupils equal, round, reactive to light and accommodation. No scleral icterus. Extraocular muscles intact.  HEENT: Head atraumatic, normocephalic. Oropharynx and nasopharynx clear.  NECK:  Supple, no jugular venous distention. No thyroid enlargement, no tenderness.  LUNGS: Normal breath sounds bilaterally, no wheezing, rales,rhonchi or crepitation. No use of accessory muscles of respiration.  CARDIOVASCULAR: S1, S2 normal.  No murmurs, rubs, or gallops.  ABDOMEN: Soft, non-tender, non-distended. Bowel sounds present. No organomegaly or mass.  EXTREMITIES: No pedal edema, cyanosis, or clubbing.  NEUROLOGIC: Cranial nerves II through XII are intact. Muscle strength 5/5 in all extremities. Sensation intact. Gait not checked.  PSYCHIATRIC: The patient is alert and oriented x 3.  SKIN: No obvious rash, lesion, or ulcer.   DATA REVIEW:   CBC  Recent Labs  Lab 08/19/17 0515  WBC 13.8*  HGB 10.3*  HCT 30.7*  PLT 270    Chemistries  Recent Labs  Lab 08/18/17 1906  08/19/17 1130  08/21/17 0441  NA 134*   < >  --    < > 135  K 3.6   < >  --    < > 3.8  CL 97*   < >  --    < > 103  CO2 25   < >  --    < > 25  GLUCOSE 123*   < >  --    < > 115*  BUN 17   < >  --    < > 10  CREATININE 0.45   < >  --    < > 0.42*  CALCIUM 8.8*   < >  --    < > 8.5*  MG  --   --  1.8  --   --   AST 82*  --   --   --   --   ALT 55*  --   --   --   --   ALKPHOS 88  --   --   --   --   BILITOT 1.1  --   --   --   --    < > = values in this interval not displayed.    Microbiology Results   Recent Results (from the past 240 hour(s))  Blood culture (routine x 2)     Status: None (Preliminary result)   Collection Time: 08/18/17  7:55 PM  Result Value Ref Range Status   Specimen Description BLOOD LEFT AC  Final   Special Requests   Final    BOTTLES DRAWN AEROBIC AND ANAEROBIC Blood Culture results may not be optimal due to an excessive volume of blood received in culture bottles   Culture NO GROWTH 3 DAYS  Final   Report Status PENDING  Incomplete  Blood culture (routine x 2)     Status: None (Preliminary result)   Collection Time: 08/18/17  7:55 PM  Result Value Ref Range Status   Specimen Description BLOOD RIGHT AC  Final   Special Requests   Final    BOTTLES DRAWN AEROBIC AND ANAEROBIC Blood Culture results may not be optimal due to an excessive volume of blood received in culture bottles   Culture NO GROWTH 3 DAYS   Final   Report Status PENDING  Incomplete    RADIOLOGY:  Dg Chest 2 View  Result Date: 08/20/2017 CLINICAL DATA:  Pneumonia. EXAM: CHEST  2 VIEW COMPARISON:  CT scan and chest x-ray dated 08/18/2017 and chest x-ray dated 06/14/2017 FINDINGS: Focal infiltrate at the left lung base is unchanged as is a small patchy infiltrate at the right lung base laterally. The infiltrate in the anterior aspect of the right upper lobe appears improved on the lateral view. It is not appreciable on the AP view. Heart size and pulmonary vascularity are normal. Aortic atherosclerosis. No acute bone abnormality. Severe chronic lumbar scoliosis. IMPRESSION: 1. Persistent infiltrates at the lung bases, essentially unchanged. 2. Partial clearing of the right upper lobe infiltrate. 3. Aortic atherosclerosis. Electronically Signed   By: Lorriane Shire M.D.   On: 08/20/2017 10:36     Management plans discussed with the patient, family and they are in agreement.  CODE STATUS:     Code Status Orders  (From admission, onward)        Start     Ordered   08/18/17 2323  Full code  Continuous     08/18/17 2322    Code Status History    Date Active Date Inactive Code Status Order ID Comments User Context   06/15/2017 03:32 06/17/2017 15:11 Full Code [id number]  Harrie Foreman, MD ED   05/31/2017 16:25 06/01/2017 18:56 Full Code [id number]  [name], MD Inpatient   01/05/2017 15:55 01/06/2017 16:48 Full Code [id number]  Theodoro Grist, MD Inpatient   03/15/2016 02:25 03/18/2016 21:49 Full Code [id number]  Saundra Shelling, MD Inpatient    Advance Directive Documentation     Most Recent Value  Type of Advance Directive  Healthcare Power of Attorney, Living will  Pre-existing out of facility DNR order (yellow form or pink MOST form)  No data  "MOST" Form in Place?  No data      TOTAL TIME TAKING CARE OF THIS PATIENT: 40 minutes.    Fritzi Mandes M.D on 08/21/2017 at 8:57 AM  Between 7am to 6pm - Pager -  [phone] After 6pm go to [email or web address] - password [email or web address]  Office  [phone]  CC: Primary care physician; Baxter Hire, MD

## 2017-08-21 NOTE — Care Management Important Message (Signed)
Important Message  Patient Details  Name: Janice Hoffman MRN: [id number] Date of Birth: [birth year]   Medicare Important Message Given:  Yes    Beverly Sessions, RN 08/21/2017, 2:06 PM

## 2017-08-21 NOTE — Progress Notes (Signed)
I was doing am meds with the patient and the patient kept asking why she needed to take these meds.  She said she hasn't had to take meds in the past.  She was also short of breath post walking and requested an inhaler.  I asked her to take some deep breaths which she did until she no longer felt short of breath.  I asked her how many times a day she uses her inhaler such as 5 or 10.  She just smiled at me and said she had no idea.   (It was noted this morning that she had her proair inhaler in her lap.  When I questioned her about it she said it was empty).  I did imform her son, Dr Rock Nephew, about this

## 2017-08-23 LAB — CULTURE, BLOOD (ROUTINE X 2)
CULTURE: NO GROWTH
CULTURE: NO GROWTH

## 2017-12-14 ENCOUNTER — Emergency Department
Admission: EM | Admit: 2017-12-14 | Discharge: 2017-12-15 | Disposition: A | Discharge status: Home / Self Care | Payer: Medicare Other | Attending: Emergency Medicine | Admitting: Emergency Medicine

## 2017-12-14 ENCOUNTER — Emergency Department: Payer: Medicare Other

## 2017-12-14 DIAGNOSIS — M25562 Pain in left knee: Secondary | ICD-10-CM | POA: Insufficient documentation

## 2017-12-14 DIAGNOSIS — I10 Essential (primary) hypertension: Secondary | ICD-10-CM | POA: Insufficient documentation

## 2017-12-14 DIAGNOSIS — G8929 Other chronic pain: Secondary | ICD-10-CM | POA: Insufficient documentation

## 2017-12-14 DIAGNOSIS — K5900 Constipation, unspecified: Secondary | ICD-10-CM | POA: Diagnosis not present

## 2017-12-14 DIAGNOSIS — J45909 Unspecified asthma, uncomplicated: Secondary | ICD-10-CM | POA: Diagnosis not present

## 2017-12-14 DIAGNOSIS — M79605 Pain in left leg: Secondary | ICD-10-CM | POA: Insufficient documentation

## 2017-12-14 DIAGNOSIS — Z7901 Long term (current) use of anticoagulants: Secondary | ICD-10-CM | POA: Diagnosis not present

## 2017-12-14 DIAGNOSIS — Z79899 Other long term (current) drug therapy: Secondary | ICD-10-CM | POA: Insufficient documentation

## 2017-12-14 LAB — CBC
HEMATOCRIT: 39.7 % (ref 35.0–47.0)
HEMOGLOBIN: 13.1 g/dL (ref 12.0–16.0)
MCH: 28.6 pg (ref 26.0–34.0)
MCHC: 33 g/dL (ref 32.0–36.0)
MCV: 86.7 fL (ref 80.0–100.0)
Platelets: 181 10*3/uL (ref 150–440)
RBC: 4.58 MIL/uL (ref 3.80–5.20)
RDW: 14.5 % (ref 11.5–14.5)
WBC: 4.4 10*3/uL (ref 3.6–11.0)

## 2017-12-14 LAB — COMPREHENSIVE METABOLIC PANEL
ALBUMIN: 4.2 g/dL (ref 3.5–5.0)
ALT: 11 U/L — AB (ref 14–54)
AST: 19 U/L (ref 15–41)
Alkaline Phosphatase: 37 U/L — ABNORMAL LOW (ref 38–126)
Anion gap: 6 (ref 5–15)
BILIRUBIN TOTAL: 0.8 mg/dL (ref 0.3–1.2)
BUN: 18 mg/dL (ref 6–20)
CALCIUM: 9.2 mg/dL (ref 8.9–10.3)
CO2: 28 mmol/L (ref 22–32)
Chloride: 104 mmol/L (ref 101–111)
Creatinine, Ser: 0.55 mg/dL (ref 0.44–1.00)
GFR calc Af Amer: >60 mL/min (ref >60)
GFR calc non Af Amer: >60 mL/min (ref >60)
GLUCOSE: 92 mg/dL (ref 65–99)
POTASSIUM: 3.9 mmol/L (ref 3.5–5.1)
Sodium: 138 mmol/L (ref 135–145)
TOTAL PROTEIN: 7.4 g/dL (ref 6.5–8.1)

## 2017-12-14 NOTE — ED Triage Notes (Signed)
Per EMS: patient coming from home. Patient called out for bilateral leg pain. 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 Patient's son reported to EMS patient much less ambulatory than normal due to leg pain.

## 2017-12-14 NOTE — ED Notes (Signed)
EMS reports patient hypertensive, SBP >200. EMS further reports that patient not able to assist with movement/ambulation

## 2017-12-14 NOTE — ED Provider Notes (Signed)
[facility] Emergency Department Provider Note   ____________________________________________   First MD Initiated Contact with Patient 12/14/17 2310     (approximate)  I have reviewed the triage vital signs and the nursing notes.   HISTORY  Chief Complaint Leg Pain (bilateral)    HPI Janice Hoffman is a 84 y.o. female brought to the ED from home via EMS with a chief complaint of bilateral leg pain.  Per son, who is a Restaurant manager, fast food, patient has chronic left knee and lower leg pain status post tibial plateau fracture and surgery 8 years ago.  He states that on March 12, patient stopped walking altogether.  Before that she was ambulating independently and taking tramadol for her chronic left lower leg pain.  She saw her PCP who did lab work and urine and was found to have low B12 and is now currently undergoing a series of B12 injections.  This week patient seemed determined to ambulate and she did so with home physical therapy.  This evening patient complained of pain behind both knees.  Son called EMS because bilateral leg pain is unusual for the patient.  Currently patient only complains of pain behind her left knee which she describes as her chronic pain.  Patient and son deny recent fall/injury/trauma.  Patient lives with her son and daughter-in-law.  Denies recent fever, chills, chest pain, shortness of breath, abdominal pain, nausea, vomiting.  Denies recent travel.   Past Medical History:  Diagnosis Date  . Asthma   . COPD (chronic obstructive pulmonary disease) ([place])   . Herpes zoster   . Hypertension   . Neuralgia     Patient Active Problem List   Diagnosis Date Noted  . CAP (community acquired pneumonia) 08/18/2017  . New onset a-fib ([place]) 06/15/2017  . Near syncope 05/31/2017  . COPD with acute exacerbation ([place]) 01/05/2017  . Essential hypertension, malignant 01/05/2017  . Dysphagia 01/05/2017  . COPD exacerbation ([place]) 03/15/2016  .  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 ELEVATED BLOOD PRESSURE 05/12/2008  . HERPES ZOSTER 04/26/2008  . B12 DEFICIENCY 04/26/2008  . CONSTIPATION 04/26/2008  . LEG EDEMA, BILATERAL 04/26/2008    Past Surgical History:  Procedure Laterality Date  . LEG SURGERY      Prior to Admission medications   Medication Sig Start Date End Date Taking? Authorizing Provider  acetaminophen (TYLENOL) 325 MG tablet Take 650 mg by mouth every 6 (six) hours as needed.    [provider]  albuterol (PROVENTIL HFA;VENTOLIN HFA) 108 (90 Base) MCG/ACT inhaler Inhale 2 puffs into the lungs every 6 (six) hours as needed for wheezing or shortness of breath.    [provider]  albuterol (PROVENTIL) (5 MG/ML) 0.5% nebulizer solution Take 2.5 mg by nebulization every 6 (six) hours as needed for wheezing or shortness of breath.    [provider]  apixaban (ELIQUIS) 2.5 MG TABS tablet Take 1 tablet (2.5 mg total) by mouth 2 (two) times daily. 06/17/17   [name], MD  docusate sodium (COLACE) 100 MG capsule Take 1 capsule (100 mg total) by mouth 2 (two) times daily as needed for mild constipation. 06/17/17   Gouru, Illene Silver, MD  feeding supplement, ENSURE ENLIVE, (ENSURE ENLIVE) LIQD Take 237 mLs by mouth 2 (two) times daily between meals. 03/18/16   [name], MD  Fluticasone-Salmeterol (ADVAIR) 500-50 MCG/DOSE AEPB Inhale 1 puff into the lungs 2 (two) times daily.    [provider]  guaiFENesin (MUCINEX) 600 MG 12 hr  tablet Take 1 tablet (600 mg total) by mouth 2 (two) times daily. 08/20/17   Gladstone Lighter, MD  ipratropium-albuterol (DUONEB) 0.5-2.5 (3) MG/3ML SOLN Take 3 mLs by nebulization every 6 (six) hours as needed.    [provider]  levofloxacin (LEVAQUIN) 500 MG tablet Take 1 tablet (500 mg total) by mouth daily. 08/20/17   Gladstone Lighter, MD  meclizine (ANTIVERT) 12.5 MG tablet Take 12.5 mg by mouth 3 (three) times daily as needed for dizziness.     [provider]  metoprolol tartrate (LOPRESSOR) 25 MG tablet Take 1 tablet (25 mg total) by mouth 2 (two) times daily. 08/20/17   Gladstone Lighter, MD  predniSONE (STERAPRED UNI-PAK 21 TAB) 10 MG (21) TBPK tablet 6 tabs PO x 1 day 5 tabs PO x 1 day 4 tabs PO x 1 day 3 tabs PO x 1 day 2 tabs PO x 1 day 1 tab PO x 1 day and stop 08/20/17   Gladstone Lighter, MD  senna (SENOKOT) 8.6 MG TABS tablet Take 1 tablet by mouth daily as needed for mild constipation.    [provider]  tiotropium (SPIRIVA) 18 MCG inhalation capsule Place 1 capsule (18 mcg total) into inhaler and inhale daily. 01/07/17   Max Sane, MD  traMADol (ULTRAM) 50 MG tablet Take 50 mg by mouth every 6 (six) hours as needed. Reported on 03/14/2016    [provider]    Allergies Nsaids; Penicillins; and Promethazine hcl  Family History  Problem Relation Age of Onset  . COPD Sister     Social History Social History   Tobacco Use  . Smoking status: Never Smoker  . Smokeless tobacco: Never Used  Substance Use Topics  . Alcohol use: No    Alcohol/week: 0.0 oz  . Drug use: No    Review of Systems  Constitutional: No fever/chills. Eyes: No visual changes. ENT: No sore throat. Cardiovascular: Denies chest pain. Respiratory: Denies shortness of breath. Gastrointestinal: No abdominal pain.  No nausea, no vomiting.  No diarrhea.  No constipation. Genitourinary: Negative for dysuria. Musculoskeletal: Positive for bilateral lower extremity pain.  Negative for back pain. Skin: Negative for rash. Neurological: Negative for headaches, focal weakness or numbness.   ____________________________________________   PHYSICAL EXAM:  VITAL SIGNS: ED Triage Vitals  Enc Vitals Group     BP 12/14/17 2233 (!) 199/107     Pulse Rate 12/14/17 2233 69     Resp 12/14/17 2239 18     Temp 12/14/17 2239 98.1 F (36.7 C)     Temp Source 12/14/17 2239 Oral     SpO2 12/14/17 2233 97 %     Weight  12/14/17 2235 122 lb (55.3 kg)     Height --      Head Circumference --      Peak Flow --      Pain Score --      Pain Loc --      Pain Edu? --      Excl. in [place]? --     Constitutional: Alert and oriented. Well appearing and in no acute distress. Eyes: Conjunctivae are normal. PERRL. EOMI. Head: Atraumatic. Nose: No congestion/rhinnorhea. Mouth/Throat: Mucous membranes are moist.  Oropharynx non-erythematous. Neck: No stridor.  No cervical spine tenderness to palpation. Cardiovascular: Normal rate, regular rhythm. Grossly normal heart sounds.  Good peripheral circulation. Respiratory: Normal respiratory effort.  No retractions. Lungs CTAB. Gastrointestinal: Soft and nontender. No distention. No abdominal bruits. No CVA tenderness. Musculoskeletal: Left knee  appears to be chronically more swollen than the right.  This is confirmed by patient's son.  There is no spinal tenderness to palpation.  Pelvis is stable.  Full range of motion bilateral hips without pain.  Full range of motion bilateral knees without pain.  Bilateral calves are supple.  There is no tenderness to palpation behind either knee.  2+ femoral and distal pulses.  Brisk, less than 5-second capillary refill.  Legs are symmetrically warm without evidence for ischemia. Neurologic:  Normal speech and language. No gross focal neurologic deficits are appreciated.  Skin:  Skin is warm, dry and intact. No rash noted. Psychiatric: Mood and affect are normal. Speech and behavior are normal.  ____________________________________________   LABS (all labs ordered are listed, but only abnormal results are displayed)  Labs Reviewed  URINALYSIS, COMPLETE (UACMP) WITH MICROSCOPIC - Abnormal; Notable for the following components:      Result Value   Color, Urine YELLOW (*)    APPearance CLEAR (*)    Squamous Epithelial / LPF 0-5 (*)    All other components within normal limits  COMPREHENSIVE METABOLIC PANEL - Abnormal; Notable for the  following components:   ALT 11 (*)    Alkaline Phosphatase 37 (*)    All other components within normal limits  CBC  TROPONIN I   ____________________________________________  EKG  ED ECG REPORT I, Akua Blethen J, the attending physician, personally viewed and interpreted this ECG.   Date: 12/14/2017  EKG Time: 2239  Rate: 70  Rhythm: normal EKG, normal sinus rhythm  Axis: LAD  Intervals:left bundle branch block  ST&T Change: Nonspecific  ____________________________________________  RADIOLOGY  ED MD interpretation: No DVT, unremarkable pelvis, KUB consistent with moderate stool burden  Official radiology report(s): Dg Pelvis 1-2 Views  Result Date: 12/15/2017 CLINICAL DATA:  Bilateral leg pain without injury EXAM: PELVIS - 1-2 VIEW COMPARISON:  06/14/2017 FINDINGS: Included lumbar spine demonstrates moderate degenerative disc disease with flattening from L3 through S1 and levoconvex curvature. Bowel loops project over both iliac bones limiting assessment. No acute pelvic fracture or diastasis is identified. 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 IMPRESSION: Lower lumbar degenerative disc disease from L3 through S1. 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 No acute osseous appearing abnormality of the pelvis and hips. Electronically Signed   By: Ashley Royalty M.D.   On: 12/15/2017 00:45   Dg Abdomen 1 View  Result Date: 12/15/2017 CLINICAL DATA:  Constipation, last bowel movement 1 week ago. EXAM: ABDOMEN - 1 VIEW COMPARISON:  None. FINDINGS: Thoracolumbar spondylosis with S-shaped scoliosis. Significant stool retention throughout the colon consistent with constipation. No bowel obstruction. No free air. Left basilar scarring or atelectasis. No radiopaque calculi. IMPRESSION: 1. S-shaped scoliosis. 2. Significant fecal retention throughout the colon consistent with constipation.  Electronically Signed   By: Ashley Royalty M.D.   On: 12/15/2017 02:01   US Venous Img Lower Bilateral  Result Date: 12/15/2017 CLINICAL DATA:  Bilateral leg pain left greater than right EXAM: BILATERAL LOWER EXTREMITY VENOUS DOPPLER ULTRASOUND TECHNIQUE: Gray-scale sonography with graded compression, as well as color Doppler and duplex ultrasound were performed to evaluate the lower extremity deep venous systems from the level of the common femoral vein and including the common femoral, femoral, profunda femoral, popliteal and calf veins including the posterior tibial, peroneal and gastrocnemius veins when visible. The superficial great saphenous vein was also interrogated. Spectral Doppler was utilized to evaluate flow at rest and with distal  augmentation maneuvers in the common femoral, femoral and popliteal veins. COMPARISON:  None. FINDINGS: RIGHT LOWER EXTREMITY Common Femoral Vein: No evidence of thrombus. Normal compressibility, respiratory phasicity and response to augmentation. Saphenofemoral Junction: No evidence of thrombus. Normal compressibility and flow on color Doppler imaging. Profunda Femoral Vein: No evidence of thrombus. Normal compressibility and flow on color Doppler imaging. Demonstrated augmentation. Femoral Vein: No evidence of thrombus. Normal compressibility, respiratory phasicity and response to augmentation. Popliteal Vein: No evidence of thrombus. Normal compressibility, respiratory phasicity and response to augmentation. Calf Veins: No evidence of thrombus. Normal compressibility and flow on color Doppler imaging. Superficial Great Saphenous Vein: No evidence of thrombus. Normal compressibility. Venous Reflux:  None. Other Findings:  None. LEFT LOWER EXTREMITY Common Femoral Vein: No evidence of thrombus. Normal compressibility, respiratory phasicity and response to augmentation. Saphenofemoral Junction: No evidence of thrombus. Normal compressibility and flow on color Doppler imaging.  Profunda Femoral Vein: No evidence of thrombus. Normal compressibility and flow on color Doppler imaging. Femoral Vein: No evidence of thrombus. Normal compressibility, respiratory phasicity and response to augmentation. Popliteal Vein: No evidence of thrombus. Normal compressibility, respiratory phasicity and response to augmentation. Calf Veins: No evidence of thrombus. Normal compressibility and flow on color Doppler imaging. Superficial Great Saphenous Vein: No evidence of thrombus. Normal compressibility. Venous Reflux:  None. Other Findings:  None. IMPRESSION: No evidence of deep venous thrombosis. Electronically Signed   By: Ashley Royalty M.D.   On: 12/15/2017 00:42    ____________________________________________   PROCEDURES  Procedure(s) performed: None  Procedures  Critical Care performed: No  ____________________________________________   INITIAL IMPRESSION / ASSESSMENT AND PLAN / ED COURSE  As part of my medical decision making, I reviewed the following data within the electronic MEDICAL RECORD NUMBER History obtained from family, Nursing notes reviewed and incorporated, Labs reviewed, EKG interpreted, Old chart reviewed, Radiograph reviewed and Notes from prior ED visits   84 year old female who presents with pain behind both knees, nontraumatic.  In the setting of recent immobility, differential diagnosis includes but is not limited to DVT, musculoskeletal, electrolyte imbalance, infectious etiology, etc.  CBC and electrolytes are unremarkable.  Patient declines analgesia at this time.  Discussed with patient and her son; I think it is reasonable to obtain pelvic x-ray and venous Doppler of bilateral lower extremities to evaluate for DVT.  Clinical Course as of Dec 16 726  Mon Dec 15, 2017  0133 Extensive discussion with patient's son who is a Curator.  Currently patient is receiving weekly physical therapy for only a total of 2 weeks.  They have no home health  resource currently.  It is his hope that they can keep the patient at home as she is being well cared for; however, son would appreciate additional resources.  He also mentions that patient has not had a BM in a week.  This is not unusual for her but he thinks this is contributing to patient's discomfort.  He is also requesting a referral for her to see Dr. Marry Guan who she has seen previously to see if cortisone injections may be able to help her chronic knee pain.  We have come up with the following plan to administer a soapsuds enema overnight, will have social worker see the patient in the morning to see if she qualifies for more resources at home.  Son will make the orthopedic appointment; I will send an Epic inbasket message in the interim.    [JS]  [id number] Patient had a very successful  and large bowel movement after soapsuds enema.   [JS]  [id number] Patient resting in no acute distress.  Home medications have been ordered.  Patient will remain in the ED pending clinical social work and physical therapy evaluations this morning.  Care transferred to Dr. Kerman Passey.   [JS]    Clinical Course User Index [JS] Paulette Blanch, MD     ____________________________________________   FINAL CLINICAL IMPRESSION(S) / ED DIAGNOSES  Final diagnoses:  Left leg pain  Chronic pain of left knee  Constipation, unspecified constipation type     ED Discharge Orders    None       Note:  This document was prepared using Dragon voice recognition software and may include unintentional dictation errors.    Paulette Blanch, MD 12/15/17 [phone]

## 2017-12-15 ENCOUNTER — Other Ambulatory Visit: Payer: Self-pay

## 2017-12-15 ENCOUNTER — Emergency Department: Payer: Medicare Other

## 2017-12-15 LAB — URINALYSIS, COMPLETE (UACMP) WITH MICROSCOPIC
Bacteria, UA: NONE SEEN
Bilirubin Urine: NEGATIVE
Glucose, UA: NEGATIVE mg/dL
Hgb urine dipstick: NEGATIVE
KETONES UR: NEGATIVE mg/dL
Leukocytes, UA: NEGATIVE
Nitrite: NEGATIVE
PH: 5 (ref 5.0–8.0)
PROTEIN: NEGATIVE mg/dL
Specific Gravity, Urine: 1.014 (ref 1.005–1.030)

## 2017-12-15 LAB — TROPONIN I: Troponin I: <0.03 ng/mL (ref <0.03)

## 2017-12-15 MED ORDER — TIOTROPIUM BROMIDE MONOHYDRATE 18 MCG IN CAPS
18.0000 ug | ORAL_CAPSULE | Freq: Every day | RESPIRATORY_TRACT | Status: DC
  Administered 2017-12-15: 18 ug via RESPIRATORY_TRACT
  Filled 2017-12-15: qty 5

## 2017-12-15 MED ORDER — ACETAMINOPHEN 325 MG PO TABS
650.0000 mg | ORAL_TABLET | Freq: Once | ORAL | Status: AC
  Administered 2017-12-15: 650 mg via ORAL

## 2017-12-15 MED ORDER — DOCUSATE SODIUM 100 MG PO CAPS: 100.0000 mg | ORAL_CAPSULE | Freq: Every day | ORAL | 2 refills | Status: DC

## 2017-12-15 MED ORDER — POLYETHYLENE GLYCOL 3350 17 GM/SCOOP PO POWD: 17.0000 g | Freq: Every day | ORAL | 0 refills | Status: DC | PRN

## 2017-12-15 MED ORDER — IPRATROPIUM-ALBUTEROL 0.5-2.5 (3) MG/3ML IN SOLN: 3.0000 mL | Freq: Four times a day (QID) | RESPIRATORY_TRACT | Status: DC | PRN

## 2017-12-15 MED ORDER — ACETAMINOPHEN 325 MG PO TABS
ORAL_TABLET | ORAL | Status: AC
  Filled 2017-12-15: qty 2

## 2017-12-15 MED ORDER — TRAMADOL HCL 50 MG PO TABS
50.0000 mg | ORAL_TABLET | Freq: Four times a day (QID) | ORAL | Status: DC | PRN
  Administered 2017-12-15: 50 mg via ORAL
  Filled 2017-12-15: qty 1

## 2017-12-15 MED ORDER — METOPROLOL TARTRATE 25 MG PO TABS
25.0000 mg | ORAL_TABLET | Freq: Two times a day (BID) | ORAL | Status: DC
  Administered 2017-12-15: 25 mg via ORAL
  Filled 2017-12-15 (×2): qty 1

## 2017-12-15 MED ORDER — ALBUTEROL SULFATE (5 MG/ML) 0.5% IN NEBU: 2.5000 mg | INHALATION_SOLUTION | Freq: Four times a day (QID) | RESPIRATORY_TRACT | Status: DC | PRN

## 2017-12-15 NOTE — ED Notes (Signed)
Son at bedside and demanding to be given the number for the social worker so that he can talk to her himself - explained to son that I could not give this number but that the social worker would be to see pt after reviewing the PT consult - son agitated and stated that he was a doctor and that he would just call the number that the social worker called him from earlier

## 2017-12-15 NOTE — ED Notes (Signed)
Family at bedside. 

## 2017-12-15 NOTE — Clinical Social Work Note (Addendum)
CSW received inappropriate consult for "Son is a Restaurant manager, fast food and would like to speak about more resources at home eg home health and extended PT (right now she is receiving PT weekly x 2 weeks)." Please consult care management to address home health needs/resources. CSW made [facility] aware. CSW signing off as no further Social Work needs identified. Please reconsult if Social Work needs arise.   Oretha Ellis, Latanya Presser, [place] Social Worker-ED [phone]

## 2017-12-15 NOTE — Care Management (Signed)
Spoke with Dr. Edwina Barth, patients PCP. He states he has received multiple calls messages from the patients son stating that the patient couldn't be discharged until [facility], " Lattie Haw" gave the "ok". I explained to Dr. Edwina Barth that I had called [facility] to have them get consent from him the PCP to increase the patients home PT vistas due to leg pain. Dr. Edwina Barth said this was fine. I explained to him that I had explained this multiple times to the son. He was appreciative.

## 2017-12-15 NOTE — Care Management (Signed)
RNCM received call from [place] that son Janice Hoffman [phone] had questions regarding PT for his mother. Called son on number provided with no answer. Left message for return call with RNCM contact information.

## 2017-12-15 NOTE — ED Notes (Signed)
Patient had a large BM post enema.

## 2017-12-15 NOTE — Care Management (Signed)
Received TC from son Dr. Rock Nephew, [phone]. He states his mother is currently active with [facility] and is receiving home health PT 1 x week x 2 weeks. He would like for her to receive increased frequency of visits for additional time frame. RNCM notified Floydene Flock with [facility] that patient was in the ED. Also updated him on Dr. Rock Nephew request. RNCM ask that [facility] contact PCP for increased visits for PT and contact son with results. A PT consult is pending in the ED. Corene Cornea with [facility] stated he will have his office follow up with son once PT consult has been completed and PCP has been contacted.

## 2017-12-15 NOTE — ED Notes (Signed)
ED Provider at bedside. 

## 2017-12-15 NOTE — ED Notes (Signed)
Patient changed from ED stretcher to hospital bed.

## 2017-12-15 NOTE — Discharge Instructions (Addendum)
As we discussed please take your MiraLAX as needed for constipation, once you have had several good bowel movements please discontinue use of MiraLAX and continue use of daily Colace to help maintain regularity.  Please follow-up with your primary care doctor as needed.  Return to the emergency department for any acute/concerning symptoms.

## 2017-12-15 NOTE — Evaluation (Addendum)
Physical Therapy Evaluation Patient Details Name: Janice Hoffman MRN: [id number] DOB: 04-Jun-1926 Today's Date: 12/15/2017   History of Present Illness  Janice Hoffman is a 84yo white female who is brougt to the ED by Son d/t gradual decline in mobility over the past several weaks, in spite of workin gwith PT at home twice weekly. The patient has been seen here at [facility] for multiple admissions, Dec, Oct, Sep, July 2018, often with PT recommendations for STR/SNF, unclear what her ultimate DC disposition has been. Son asks that th epatient work with HHPT more than current frequency.   Clinical Impression  Pt admitted with above diagnosis. Pt currently with functional limitations due to the deficits listed below (see "PT Problem List"). Upon entry, the patient is received supine in bed, no family/caregiver present, lights off, patient resting quietly. The pt is easily awakened and agreeable to participate. No acute distress noted at this time. Examination of the knee revealing of the following: -Left knee P/ROM: 13-133 degrees -Left knee tibiofemoral angle: 26 degrees structural valgus (right side is 11 degrees)  -Left ankle contracture with plantar flexion only: 15-51 degrees dorsiflexion.  -No observable motor activation of the ankle or foot on left side, with trace hallux flexion activation (presumably from old knee surgery per son).   -Left quads: 5/5 -Left hamstrings: 4-/5 Pt demonstrating moderate to severe LLE limitations at knee and ankle, with chronic, structural  Knee and joint degeneration, as well as chronic probable neurological weakness. Per the opinion of this PT, any therapeutic intervention with this patient would be best served on mobility compensation, rather than remediation due to chronicity of the above in the setting of age and motivation of the patient. I do not think it reasonable to attempt to progress the patient back to community distance AMB, but high frequency PT services  offered at STR/SNF would demonstrate the best possible potential for mobility remediation and independence. I feel the patient would tolerate high frequency services. As an outpatient, I feel that the patient would be better served in an outpatient rehab setting, which would allow for greater frequency and duration of rehab services, as well as promote mobility required to access the community, with or without available WC. HHPT services is not my first recommendation at this time, as I would like to the patient and/or family more able to perform exercises independently on non-therapy days. Patient is at just below baseline but with adequate equipment and family support for return to home Time is given to address all questions/concerns. No additional skilled PT services needed at this venue of care. PT signing off. PT recommends daily mobility ad lib or with nursing staff as needed to prevent deconditioning.      Follow Up Recommendations Outpatient PT;Supervision for mobility/OOB;Supervision/Assistance - 24 hour    Equipment Recommendations  None recommended by PT    Recommendations for Other Services       Precautions / Restrictions        Mobility  Bed Mobility Overal bed mobility: Modified Independent                Transfers Overall transfer level: Modified independent               General transfer comment: balance limited, requires BUE support, limited to no LLE weight bearing with ankle PF contracture and significant knee structural valgus.   Ambulation/Gait Ambulation/Gait assistance: (not necessary at this time)  Stairs            Wheelchair Mobility    Modified Rankin (Stroke Patients Only)       Balance Overall balance assessment: Mild deficits observed, not formally tested;Needs assistance                                           Pertinent Vitals/Pain Pain Assessment: 0-10 Pain Score: 8  Pain Location:  Left distolateral knee (tibia) Pain Intervention(s): Limited activity within patient's tolerance    Home Living Family/patient expects to be discharged to:: Private residence Living Arrangements: Children Available Help at Discharge: Family Type of Home: House         Home Equipment: Gilford Rile - 2 wheels;Wheelchair - manual      Prior Function           Comments: Pt reports able to perform all ADLs independently with [facility] for amb within home and RW for amb outside home.      Hand Dominance   Dominant Hand: Right    Extremity/Trunk Assessment                Communication   Communication: HOH  Cognition Arousal/Alertness: Awake/alert Behavior During Therapy: WFL for tasks assessed/performed;Flat affect Overall Cognitive Status: Difficult to assess                                        General Comments      Exercises     Assessment/Plan    PT Assessment All further PT needs can be met in the next venue of care  PT Problem List Decreased strength;Decreased range of motion;Decreased mobility;Decreased cognition       PT Treatment Interventions      PT Goals (Current goals can be found in the Care Plan section)  Acute Rehab PT Goals Patient Stated Goal: go home  PT Goal Formulation: All assessment and education complete, DC therapy    Frequency     Barriers to discharge        Co-evaluation               AM-PAC PT "6 Clicks" Daily Activity  Outcome Measure Difficulty turning over in bed (including adjusting bedclothes, sheets and blankets)?: A Lot Difficulty moving from lying on back to sitting on the side of the bed? : A Lot Difficulty sitting down on and standing up from a chair with arms (e.g., wheelchair, bedside commode, etc,.)?: A Lot Help needed moving to and from a bed to chair (including a wheelchair)?: A Lot Help needed walking in hospital room?: A Lot Help needed climbing 3-5 steps with a railing? : Total 6 Click  Score: 11    End of Session   Activity Tolerance: Patient tolerated treatment well Patient left: in bed;with bed alarm set;with call bell/phone within reach   PT Visit Diagnosis: Other abnormalities of gait and mobility (R26.89);Difficulty in walking, not elsewhere classified (R26.2)    Time: 5625-6389 PT Time Calculation (min) (ACUTE ONLY): 15 min   Charges:   PT Evaluation $PT Eval Moderate Complexity: 1 Mod     PT G Codes:        11:38 AM, 2017-12-26 Etta Grandchild, PT, DPT Physical Therapist - [facility]  [phone] [facility])  Buccola,Allan C 12/15/2017, 11:38 AM

## 2017-12-15 NOTE — ED Notes (Signed)
Patient transported to Ultrasound 

## 2017-12-15 NOTE — ED Notes (Signed)
Pt given lunch tray with juice 

## 2017-12-15 NOTE — ED Notes (Signed)
X-ray at bedside

## 2017-12-15 NOTE — ED Notes (Signed)
Pt daughter arrived in room and is very agitated and stating that they are not listening to anything that PT states and that they are going to take the pt home - daughter talked to myself and Dr Tora Kindred and expressed her desire to take the pt home as soon as possible and states that she will be back around 530pm to pick her up

## 2017-12-15 NOTE — ED Notes (Signed)
Patient's son reports that patient's last BM was 1 week ago

## 2017-12-15 NOTE — ED Notes (Signed)
Pt assisted on bedpan to void 

## 2017-12-15 NOTE — ED Notes (Signed)
Spoke with pts son, he is aware that we are awaiting PT consult in ED prior to pts discharge.

## 2017-12-15 NOTE — ED Provider Notes (Signed)
-----------------------------------------   1:56 PM on 12/15/2017 -----------------------------------------  Patient has been seen by physical therapy.  I discussed the results with the daughter, as well as Orvan July in the care management provider.  She has called advanced home health who is going to try to increase the number of home PT visits for the patient.  Her medical workup is been largely nonrevealing besides constipation for which we will prescribe MiraLAX if needed for constipation and Colace to keep her regular.  Daughter in agreement to this plan of care.  Daughter is wishing to take the patient home which I believe is reasonable and rational plan of care.   Harvest Dark, MD 12/15/17 1356

## 2018-02-21 ENCOUNTER — Other Ambulatory Visit: Payer: Self-pay

## 2018-02-21 ENCOUNTER — Observation Stay
Admission: EM | Admit: 2018-02-21 | Discharge: 2018-02-23 | Disposition: A | Discharge status: Home / Self Care | Payer: Medicare Other | Attending: Specialist | Admitting: Specialist

## 2018-02-21 ENCOUNTER — Emergency Department: Payer: Medicare Other

## 2018-02-21 DIAGNOSIS — J449 Chronic obstructive pulmonary disease, unspecified: Secondary | ICD-10-CM | POA: Insufficient documentation

## 2018-02-21 DIAGNOSIS — E538 Deficiency of other specified B group vitamins: Secondary | ICD-10-CM | POA: Diagnosis not present

## 2018-02-21 DIAGNOSIS — J9601 Acute respiratory failure with hypoxia: Secondary | ICD-10-CM

## 2018-02-21 DIAGNOSIS — J441 Chronic obstructive pulmonary disease with (acute) exacerbation: Secondary | ICD-10-CM | POA: Diagnosis not present

## 2018-02-21 DIAGNOSIS — Z79899 Other long term (current) drug therapy: Secondary | ICD-10-CM | POA: Diagnosis not present

## 2018-02-21 DIAGNOSIS — I10 Essential (primary) hypertension: Secondary | ICD-10-CM | POA: Insufficient documentation

## 2018-02-21 DIAGNOSIS — J9621 Acute and chronic respiratory failure with hypoxia: Principal | ICD-10-CM | POA: Insufficient documentation

## 2018-02-21 DIAGNOSIS — E876 Hypokalemia: Secondary | ICD-10-CM | POA: Insufficient documentation

## 2018-02-21 DIAGNOSIS — I4891 Unspecified atrial fibrillation: Secondary | ICD-10-CM | POA: Insufficient documentation

## 2018-02-21 DIAGNOSIS — Z7951 Long term (current) use of inhaled steroids: Secondary | ICD-10-CM | POA: Insufficient documentation

## 2018-02-21 DIAGNOSIS — Z79891 Long term (current) use of opiate analgesic: Secondary | ICD-10-CM | POA: Insufficient documentation

## 2018-02-21 LAB — COMPREHENSIVE METABOLIC PANEL
ALBUMIN: 3.9 g/dL (ref 3.5–5.0)
ALK PHOS: 36 U/L — AB (ref 38–126)
ALT: 10 U/L — ABNORMAL LOW (ref 14–54)
AST: 23 U/L (ref 15–41)
Anion gap: 11 (ref 5–15)
BILIRUBIN TOTAL: 0.5 mg/dL (ref 0.3–1.2)
BUN: 13 mg/dL (ref 6–20)
CALCIUM: 9 mg/dL (ref 8.9–10.3)
CO2: 27 mmol/L (ref 22–32)
Chloride: 100 mmol/L — ABNORMAL LOW (ref 101–111)
Creatinine, Ser: 0.65 mg/dL (ref 0.44–1.00)
GFR calc Af Amer: >60 mL/min (ref >60)
GFR calc non Af Amer: >60 mL/min (ref >60)
GLUCOSE: 131 mg/dL — AB (ref 65–99)
Potassium: 3.4 mmol/L — ABNORMAL LOW (ref 3.5–5.1)
Sodium: 138 mmol/L (ref 135–145)
TOTAL PROTEIN: 7 g/dL (ref 6.5–8.1)

## 2018-02-21 LAB — CBC
HCT: 37.9 % (ref 35.0–47.0)
Hemoglobin: 12.6 g/dL (ref 12.0–16.0)
MCH: 29.8 pg (ref 26.0–34.0)
MCHC: 33.2 g/dL (ref 32.0–36.0)
MCV: 89.7 fL (ref 80.0–100.0)
Platelets: 195 10*3/uL (ref 150–440)
RBC: 4.23 MIL/uL (ref 3.80–5.20)
RDW: 16.3 % — AB (ref 11.5–14.5)
WBC: 5.6 10*3/uL (ref 3.6–11.0)

## 2018-02-21 LAB — TROPONIN I: Troponin I: <0.03 ng/mL (ref <0.03)

## 2018-02-21 MED ORDER — MAGNESIUM SULFATE 2 GM/50ML IV SOLN
2.0000 g | Freq: Once | INTRAVENOUS | Status: AC
  Administered 2018-02-21: 2 g via INTRAVENOUS
  Filled 2018-02-21: qty 50

## 2018-02-21 MED ORDER — ALBUTEROL SULFATE (2.5 MG/3ML) 0.083% IN NEBU
2.5000 mg | INHALATION_SOLUTION | Freq: Once | RESPIRATORY_TRACT | Status: AC
  Administered 2018-02-21: 2.5 mg via RESPIRATORY_TRACT
  Filled 2018-02-21: qty 3

## 2018-02-21 MED ORDER — ALBUTEROL SULFATE (2.5 MG/3ML) 0.083% IN NEBU
INHALATION_SOLUTION | RESPIRATORY_TRACT | Status: AC
  Filled 2018-02-21: qty 3

## 2018-02-21 MED ORDER — IPRATROPIUM-ALBUTEROL 0.5-2.5 (3) MG/3ML IN SOLN
3.0000 mL | Freq: Once | RESPIRATORY_TRACT | Status: DC
  Filled 2018-02-21: qty 3

## 2018-02-21 MED ORDER — ALBUTEROL SULFATE (2.5 MG/3ML) 0.083% IN NEBU
2.5000 mg | INHALATION_SOLUTION | Freq: Once | RESPIRATORY_TRACT | Status: AC
Start: 2018-02-21 — End: 2018-02-21
  Administered 2018-02-21: 2.5 mg via RESPIRATORY_TRACT

## 2018-02-21 NOTE — ED Provider Notes (Signed)
[facility] Emergency Department Provider Note    First MD Initiated Contact with Patient 02/21/18 2138     (approximate)  I have reviewed the triage vital signs and the nursing notes.   HISTORY  Chief Complaint Shortness of Breath    HPI Janice Hoffman is a 84 y.o. female w/ h/o copd not on home O2 presents with several days of worsening SOB.  No fevers.  No cough.  No chest pain or leg swelling.  Has been taking nebulizers at home without improvement.  Patient found to be hypoxic at home requiring supplemental O2, nebs and steroids via ems.      Past Medical History:  Diagnosis Date  . Asthma   . COPD (chronic obstructive pulmonary disease) ([place])   . Herpes zoster   . Hypertension   . Neuralgia    Family History  Problem Relation Age of Onset  . COPD Sister    Past Surgical History:  Procedure Laterality Date  . LEG SURGERY     Patient Active Problem List   Diagnosis Date Noted  . CAP (community acquired pneumonia) 08/18/2017  . New onset a-fib ([place]) 06/15/2017  . Near syncope 05/31/2017  . COPD with acute exacerbation ([place]) 01/05/2017  . Essential hypertension, malignant 01/05/2017  . Dysphagia 01/05/2017  . COPD exacerbation ([place]) 03/15/2016  . 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 ELEVATED BLOOD PRESSURE 05/12/2008  . HERPES ZOSTER 04/26/2008  . B12 DEFICIENCY 04/26/2008  . CONSTIPATION 04/26/2008  . LEG EDEMA, BILATERAL 04/26/2008      Prior to Admission medications   Medication Sig Start Date End Date Taking? Authorizing Provider  albuterol (PROVENTIL HFA;VENTOLIN HFA) 108 (90 Base) MCG/ACT inhaler Inhale 2 puffs into the lungs every 6 (six) hours as needed for wheezing or shortness of breath.   Yes [provider]  cyanocobalamin (,VITAMIN B-12,) 1000 MCG/ML injection Inject 1,000 mcg into the skin every 30 (thirty) days.    Yes [provider]  Fluticasone-Salmeterol (ADVAIR) 500-50 MCG/DOSE AEPB Inhale 1  puff into the lungs 2 (two) times daily.   Yes [provider]  ipratropium-albuterol (DUONEB) 0.5-2.5 (3) MG/3ML SOLN Take 3 mLs by nebulization every 6 (six) hours as needed.   Yes [provider]  metoprolol tartrate (LOPRESSOR) 25 MG tablet Take 1 tablet (25 mg total) by mouth 2 (two) times daily. 08/20/17  Yes Gladstone Lighter, MD  traMADol (ULTRAM) 50 MG tablet Take 50 mg by mouth every 6 (six) hours as needed for moderate pain or severe pain. Reported on 03/14/2016   Yes [provider]  apixaban (ELIQUIS) 2.5 MG TABS tablet Take 1 tablet (2.5 mg total) by mouth 2 (two) times daily. Patient not taking: Reported on 12/15/2017 06/17/17   [name], MD  docusate sodium (COLACE) 100 MG capsule Take 1 capsule (100 mg total) by mouth daily. Patient not taking: Reported on 02/21/2018 12/15/17 03/15/18  Harvest Dark, MD  feeding supplement, ENSURE ENLIVE, (ENSURE ENLIVE) LIQD Take 237 mLs by mouth 2 (two) times daily between meals. 03/18/16   [name], MD  guaiFENesin (MUCINEX) 600 MG 12 hr tablet Take 1 tablet (600 mg total) by mouth 2 (two) times daily. Patient not taking: Reported on 12/15/2017 08/20/17   Gladstone Lighter, MD  polyethylene glycol powder (GLYCOLAX/MIRALAX) powder Take 17 g by mouth daily as needed for mild constipation. Patient not taking: Reported on 02/21/2018 12/15/17   Harvest Dark, MD  tiotropium (SPIRIVA) 18 MCG inhalation capsule Place 1 capsule (18  mcg total) into inhaler and inhale daily. Patient not taking: Reported on 12/15/2017 01/07/17   Max Sane, MD    Allergies Nsaids; Penicillins; and Promethazine hcl    Social History Social History   Tobacco Use  . Smoking status: Never Smoker  . Smokeless tobacco: Never Used  Substance Use Topics  . Alcohol use: No    Alcohol/week: 0.0 oz  . Drug use: No    Review of Systems Patient denies headaches, rhinorrhea, blurry vision, numbness, shortness of breath, chest pain, edema,  cough, abdominal pain, nausea, vomiting, diarrhea, dysuria, fevers, rashes or hallucinations unless otherwise stated above in HPI. ____________________________________________   PHYSICAL EXAM:  VITAL SIGNS: Vitals:   02/21/18 2230 02/21/18 2245  BP: (!) 167/100   Pulse: 95 90  Resp: 17 (!) 22  Temp:    SpO2: 100% 100%    Constitutional: Alert and oriented. Presents in acute resp distress Eyes: Conjunctivae are normal.  Head: Atraumatic. Nose: No congestion/rhinnorhea. Mouth/Throat: Mucous membranes are moist.   Neck: No stridor. Painless ROM.  Cardiovascular: Normal rate, regular rhythm. Grossly normal heart sounds.  Good peripheral circulation. Respiratory: moderate resp distress, diminished breathsounds throughout with expiratory wheeze.   Gastrointestinal: Soft and nontender. No distention. No abdominal bruits. No CVA tenderness. Genitourinary:  Musculoskeletal: No lower extremity tenderness nor edema.  No joint effusions. Neurologic:  Normal speech and language. No gross focal neurologic deficits are appreciated. No facial droop Skin:  Skin is warm, dry and intact. No rash noted. Psychiatric: Mood and affect are normal. Speech and behavior are normal.  ____________________________________________   LABS (all labs ordered are listed, but only abnormal results are displayed)  Results for orders placed or performed during the hospital encounter of 02/21/18 (from the past 24 hour(s))  CBC     Status: Abnormal   Collection Time: 02/21/18  9:37 PM  Result Value Ref Range   WBC 5.6 3.6 - 11.0 K/uL   RBC 4.23 3.80 - 5.20 MIL/uL   Hemoglobin 12.6 12.0 - 16.0 g/dL   HCT 37.9 35.0 - 47.0 %   MCV 89.7 80.0 - 100.0 fL   MCH 29.8 26.0 - 34.0 pg   MCHC 33.2 32.0 - 36.0 g/dL   RDW 16.3 (H) 11.5 - 14.5 %   Platelets 195 150 - 440 K/uL  Troponin I     Status: None   Collection Time: 02/21/18  9:37 PM  Result Value Ref Range   Troponin I <0.03 <0.03 ng/mL  Comprehensive  metabolic panel     Status: Abnormal   Collection Time: 02/21/18  9:37 PM  Result Value Ref Range   Sodium 138 135 - 145 mmol/L   Potassium 3.4 (L) 3.5 - 5.1 mmol/L   Chloride 100 (L) 101 - 111 mmol/L   CO2 27 22 - 32 mmol/L   Glucose, Bld 131 (H) 65 - 99 mg/dL   BUN 13 6 - 20 mg/dL   Creatinine, Ser 0.65 0.44 - 1.00 mg/dL   Calcium 9.0 8.9 - 10.3 mg/dL   Total Protein 7.0 6.5 - 8.1 g/dL   Albumin 3.9 3.5 - 5.0 g/dL   AST 23 15 - 41 U/L   ALT 10 (L) 14 - 54 U/L   Alkaline Phosphatase 36 (L) 38 - 126 U/L   Total Bilirubin 0.5 0.3 - 1.2 mg/dL   GFR calc non Af Amer >60 >60 mL/min   GFR calc Af Amer >60 >60 mL/min   Anion gap 11 5 - 15  Blood  gas, venous     Status: Abnormal (Preliminary result)   Collection Time: 02/21/18  9:39 PM  Result Value Ref Range   FIO2 0.36    Delivery systems NASAL CANNULA    pH, Ven 7.33 7.250 - 7.430   pCO2, Ven 58 44.0 - 60.0 mmHg   pO2, Ven <31.0 (LL) 32.0 - 45.0 mmHg   Bicarbonate 29.9 (H) 20.0 - 28.0 mmol/L   Acid-Base Excess 2.5 (H) 0.0 - 2.0 mmol/L   O2 Saturation PENDING %   Patient temperature 36.0    Collection site VENOUS    Sample type VENOUS    ____________________________________________  EKG My review and personal interpretation at Time: 21:38   Indication: sob  Rate: 80  Rhythm: sinus Axis: normal Other: nonspecific st abn, no stemi ____________________________________________  RADIOLOGY  I personally reviewed all radiographic images ordered to evaluate for the above acute complaints and reviewed radiology reports and findings.  These findings were personally discussed with the patient.  Please see medical record for radiology report.  ____________________________________________   PROCEDURES  Procedure(s) performed:  .Critical Care Performed by: Merlyn Lot, MD Authorized by: Merlyn Lot, MD   Critical care provider statement:    Critical care time (minutes):  40   Critical care time was exclusive of:   Separately billable procedures and treating other patients   Critical care was necessary to treat or prevent imminent or life-threatening deterioration of the following conditions:  Respiratory failure   Critical care was time spent personally by me on the following activities:  Development of treatment plan with patient or surrogate, discussions with consultants, evaluation of patient's response to treatment, examination of patient, obtaining history from patient or surrogate, ordering and performing treatments and interventions, ordering and review of laboratory studies, ordering and review of radiographic studies, pulse oximetry, re-evaluation of patient's condition and review of old charts      Critical Care performed: yes ____________________________________________   INITIAL IMPRESSION / [place] / ED COURSE  Pertinent labs & imaging results that were available during my care of the patient were reviewed by me and considered in my medical decision making (see chart for details).   DDX: Asthma, copd, CHF, pna, ptx, malignancy, Pe, anemia   KINJAL NEITZKE is a 84 y.o. who presents to the ED with acute respiratory distress as described above.  Patient found to be hypoxic requiring supplemental oxygen based on her respiratory distress was placed on BiPAP for respiratory support as she does not have significant reserve based on her age.  X-ray and presentation most clinically consistent with acute COPD exacerbation.  No evidence of pneumonia or CHF at this time.  Patient is already anticoagulated.  Not clinically consistent with PE.  At this point will continue to observe patient to evaluate for improvement with current respiratory support to further disposition to ICU versus able to de-escalate BiPAP.  Clinical Course as of Feb 21 2313  Sat Feb 21, 2018  2241 Patient reassessed.  Much improved after 1 hour BiPAP.  Will trial.  Off of BiPAP.  At this point I do believe she stable  and appropriate for admission the hospital.   [PR]    Clinical Course User Index [PR] Merlyn Lot, MD     As part of my medical decision making, I reviewed the following data within the [place] notes reviewed and incorporated, Labs reviewed, notes from prior ED visits.  ____________________________________________   FINAL CLINICAL IMPRESSION(S) / ED DIAGNOSES  Final diagnoses:  Acute respiratory failure with hypoxia (HCC)  COPD exacerbation (HCC)      NEW MEDICATIONS STARTED DURING THIS VISIT:  New Prescriptions   No medications on file     Note:  This document was prepared using Dragon voice recognition software and may include unintentional dictation errors.    Merlyn Lot, MD 02/21/18 [phone]

## 2018-02-21 NOTE — ED Notes (Signed)
Pt was taken off bipap by dr. Quentin Cornwall and placed on oxygen at 3lpm via [place]. Pt is tolerating  well. Pt placed back on cont pox and heart monitor that pt states was removed by MD.

## 2018-02-21 NOTE — ED Notes (Signed)
Pt's son is requesting a UA. md notified.

## 2018-02-21 NOTE — ED Notes (Signed)
Mask adjusted per pt's son request.

## 2018-02-21 NOTE — ED Notes (Signed)
Critical less than 31.0 po2 called from rt, md notified. No new orders received.

## 2018-02-21 NOTE — ED Triage Notes (Signed)
Pt with wheezing in all lung fields, per ems pt was not able to talk on scene. Pt had 3 duonebs, 125mg  solumedrol. Pt is able to speak in short chopped words currently.

## 2018-02-21 NOTE — ED Notes (Signed)
Pt tolerating bipap well.  °

## 2018-02-22 ENCOUNTER — Other Ambulatory Visit: Payer: Self-pay

## 2018-02-22 DIAGNOSIS — J9621 Acute and chronic respiratory failure with hypoxia: Secondary | ICD-10-CM | POA: Diagnosis present

## 2018-02-22 LAB — TSH: TSH: 1.424 u[IU]/mL (ref 0.350–4.500)

## 2018-02-22 LAB — URINALYSIS, COMPLETE (UACMP) WITH MICROSCOPIC
Bacteria, UA: NONE SEEN
Bilirubin Urine: NEGATIVE
Glucose, UA: 50 mg/dL — AB
Ketones, ur: NEGATIVE mg/dL
Leukocytes, UA: NEGATIVE
Nitrite: NEGATIVE
Protein, ur: NEGATIVE mg/dL
Specific Gravity, Urine: 1.012 (ref 1.005–1.030)
Squamous Epithelial / LPF: NONE SEEN (ref 0–5)
pH: 5 (ref 5.0–8.0)

## 2018-02-22 MED ORDER — ACETAMINOPHEN 325 MG PO TABS
650.0000 mg | ORAL_TABLET | Freq: Four times a day (QID) | ORAL | Status: DC | PRN
  Administered 2018-02-22: 650 mg via ORAL
  Filled 2018-02-22: qty 2

## 2018-02-22 MED ORDER — ACETAMINOPHEN 650 MG RE SUPP: 650.0000 mg | Freq: Four times a day (QID) | RECTAL | Status: DC | PRN

## 2018-02-22 MED ORDER — HYDRALAZINE HCL 20 MG/ML IJ SOLN: 10.0000 mg | Freq: Four times a day (QID) | INTRAMUSCULAR | Status: DC | PRN

## 2018-02-22 MED ORDER — PREDNISONE 20 MG PO TABS: 20.0000 mg | ORAL_TABLET | Freq: Every day | ORAL | Status: DC

## 2018-02-22 MED ORDER — ENOXAPARIN SODIUM 40 MG/0.4ML ~~LOC~~ SOLN
40.0000 mg | SUBCUTANEOUS | Status: DC
  Administered 2018-02-22: 40 mg via SUBCUTANEOUS
  Filled 2018-02-22: qty 0.4

## 2018-02-22 MED ORDER — IPRATROPIUM-ALBUTEROL 0.5-2.5 (3) MG/3ML IN SOLN
3.0000 mL | Freq: Four times a day (QID) | RESPIRATORY_TRACT | Status: DC
  Administered 2018-02-22 (×2): 3 mL via RESPIRATORY_TRACT
  Filled 2018-02-22 (×2): qty 3

## 2018-02-22 MED ORDER — PREDNISONE 20 MG PO TABS: 30.0000 mg | ORAL_TABLET | Freq: Every day | ORAL | Status: DC

## 2018-02-22 MED ORDER — POTASSIUM CHLORIDE CRYS ER 20 MEQ PO TBCR
40.0000 meq | EXTENDED_RELEASE_TABLET | Freq: Every day | ORAL | Status: AC
  Administered 2018-02-22 – 2018-02-23 (×2): 40 meq via ORAL
  Filled 2018-02-22 (×2): qty 2

## 2018-02-22 MED ORDER — ENSURE ENLIVE PO LIQD
237.0000 mL | Freq: Two times a day (BID) | ORAL | Status: DC
  Administered 2018-02-22 – 2018-02-23 (×3): 237 mL via ORAL

## 2018-02-22 MED ORDER — PREDNISONE 10 MG PO TABS: 10.0000 mg | ORAL_TABLET | Freq: Every day | ORAL | Status: DC

## 2018-02-22 MED ORDER — TRAMADOL HCL 50 MG PO TABS: 50.0000 mg | ORAL_TABLET | Freq: Four times a day (QID) | ORAL | Status: DC | PRN

## 2018-02-22 MED ORDER — PREDNISONE 20 MG PO TABS
40.0000 mg | ORAL_TABLET | Freq: Every day | ORAL | Status: AC
Start: 2018-02-23 — End: 2018-02-23
  Administered 2018-02-23: 40 mg via ORAL
  Filled 2018-02-22: qty 2

## 2018-02-22 MED ORDER — ORAL CARE MOUTH RINSE
15.0000 mL | Freq: Two times a day (BID) | OROMUCOSAL | Status: DC
  Administered 2018-02-22: 09:00:00 15 mL via OROMUCOSAL

## 2018-02-22 MED ORDER — ONDANSETRON HCL 4 MG/2ML IJ SOLN: 4.0000 mg | Freq: Four times a day (QID) | INTRAMUSCULAR | Status: DC | PRN

## 2018-02-22 MED ORDER — DOCUSATE SODIUM 100 MG PO CAPS
100.0000 mg | ORAL_CAPSULE | Freq: Two times a day (BID) | ORAL | Status: DC
  Administered 2018-02-22 – 2018-02-23 (×4): 100 mg via ORAL
  Filled 2018-02-22 (×4): qty 1

## 2018-02-22 MED ORDER — PREDNISONE 10 MG PO TABS: 5.0000 mg | ORAL_TABLET | Freq: Every day | ORAL | Status: DC

## 2018-02-22 MED ORDER — PREDNISONE 50 MG PO TABS
50.0000 mg | ORAL_TABLET | Freq: Every day | ORAL | Status: AC
Start: 2018-02-22 — End: 2018-02-22
  Administered 2018-02-22: 50 mg via ORAL
  Filled 2018-02-22: qty 1

## 2018-02-22 MED ORDER — ONDANSETRON HCL 4 MG PO TABS: 4.0000 mg | ORAL_TABLET | Freq: Four times a day (QID) | ORAL | Status: DC | PRN

## 2018-02-22 MED ORDER — ALPRAZOLAM 0.25 MG PO TABS
0.2500 mg | ORAL_TABLET | Freq: Once | ORAL | Status: AC
  Administered 2018-02-22: 0.25 mg via ORAL
  Filled 2018-02-22: qty 1

## 2018-02-22 MED ORDER — CYANOCOBALAMIN 1000 MCG/ML IJ SOLN
1000.0000 ug | INTRAMUSCULAR | Status: DC
  Administered 2018-02-22: 1000 ug via SUBCUTANEOUS
  Filled 2018-02-22: qty 1

## 2018-02-22 MED ORDER — IPRATROPIUM-ALBUTEROL 0.5-2.5 (3) MG/3ML IN SOLN
3.0000 mL | RESPIRATORY_TRACT | Status: DC
  Administered 2018-02-22: 08:00:00 3 mL via RESPIRATORY_TRACT
  Filled 2018-02-22: qty 3

## 2018-02-22 MED ORDER — FLUTICASONE FUROATE-VILANTEROL 200-25 MCG/INH IN AEPB
1.0000 | INHALATION_SPRAY | Freq: Every day | RESPIRATORY_TRACT | Status: DC
  Administered 2018-02-22 – 2018-02-23 (×2): 1 via RESPIRATORY_TRACT
  Filled 2018-02-22: qty 28

## 2018-02-22 MED ORDER — METOPROLOL TARTRATE 25 MG PO TABS
25.0000 mg | ORAL_TABLET | Freq: Two times a day (BID) | ORAL | Status: DC
  Administered 2018-02-22 – 2018-02-23 (×4): 25 mg via ORAL
  Filled 2018-02-22 (×4): qty 1

## 2018-02-22 NOTE — Progress Notes (Signed)
Patient's BP 193/93, notified Dr. Verdell Carmine, advised to give second scheduled dose of metoprolol and prn hydralazine T6A for systolic above 263.

## 2018-02-22 NOTE — Care Management Obs Status (Signed)
[place] NOTIFICATION   Patient Details  Name: SHAUNE MALACARA MRN: [id number] Date of Birth: 16-Mar-1926   Medicare Observation Status Notification Given:  Yes  Patient unable to sign. Prefers RNCM to sign with an "X" to mark her signature. Also, prefers if son can read over letter when he arrives and call with any questions. Letter left on bedside table and contact info for any questions  Matheu Ploeger A Ayvah Caroll, RN 02/22/2018, 8:10 AM

## 2018-02-22 NOTE — Care Management Note (Signed)
Case Management Note  Patient Details  Name: Janice Hoffman MRN: [id number] Date of Birth: [birth year]  Subjective/Objective:       Patient admitted to [facility] under observation status for acute on chronic respiratory failure. Patient lives with her husband and son Janice Hoffman [phone]. Patient tells me she is able to complete all activities of daily living using only a cane at times for assistance. Still drives. Has used [facility] in the past but currently believes there is no need for services yet. PCP is Janice Hoffman with [facility]. Utilizes [facility] on [address] and obtains medications without issue.              Action/Plan: Requiring acute oxygen, will have nursing assess for oxygen needs when discharge is closer. RNCM to continue to follow for any needs.   Expected Discharge Date:                  Expected Discharge Plan:     In-House Referral:     Discharge planning Services     Post Acute Care Choice:    Choice offered to:     DME Arranged:    DME Agency:     HH Arranged:    HH Agency:     Status of Service:     If discussed at [employer], dates discussed:    Additional Comments:  Brylin Stanislawski A Sherman Donaldson, RN 02/22/2018, 8:12 AM

## 2018-02-22 NOTE — Progress Notes (Signed)
[place] [facility] [place] NAME: Janice Hoffman    MR#:  [id number]  [place]:  05-13-1926  SUBJECTIVE:   She is here due to worsening shortness of breath and noted to be in COPD exacerbation.  Feels better since yesterday.  Still having some minimal wheezing.  REVIEW OF SYSTEMS:    Review of Systems  Constitutional: Negative for chills and fever.  HENT: Negative for congestion and tinnitus.   Eyes: Negative for blurred vision and double vision.  Respiratory: Positive for shortness of breath. Negative for cough and wheezing.   Cardiovascular: Negative for chest pain, orthopnea and PND.  Gastrointestinal: Negative for abdominal pain, diarrhea, nausea and vomiting.  Genitourinary: Negative for dysuria and hematuria.  Neurological: Negative for dizziness, sensory change and focal weakness.  All other systems reviewed and are negative.   Nutrition: Regular  Tolerating Diet: Yes Tolerating PT: Ambulatory   DRUG ALLERGIES:   Allergies  Allergen Reactions  . Nsaids Shortness Of Breath    Throat swelling, shortness of breath  . Penicillins     Has patient had a PCN reaction causing immediate rash, facial/tongue/throat swelling, SOB or lightheadedness with hypotension: Unknown Has patient had a PCN reaction causing severe rash involving mucus membranes or skin necrosis: Unknown Has patient had a PCN reaction that required hospitalization: Unknown Has patient had a PCN reaction occurring within the last 10 years: Unknown If all of the above answers are "NO", then may proceed with Cephalosporin use.   . Promethazine Hcl     VITALS:  Blood pressure (!) 183/93, pulse 81, temperature 98.3 F (36.8 C), temperature source Oral, resp. rate 16, height 5\' 4"  (1.626 m), weight 49.4 kg (108 lb 14.4 oz), SpO2 100 %.  PHYSICAL EXAMINATION:   Physical Exam  GENERAL:  84 y.o.-year-old patient lying in bed in no acute distress.  EYES: Pupils equal,  round, reactive to light and accommodation. No scleral icterus. Extraocular muscles intact.  HEENT: Head atraumatic, normocephalic. Oropharynx and nasopharynx clear.  NECK:  Supple, no jugular venous distention. No thyroid enlargement, no tenderness.  LUNGS: Normal breath sounds bilaterally, minimal end-exp wheezing b/l, No rales, rhonchi. No use of accessory muscles of respiration.  CARDIOVASCULAR: S1, S2 normal. No murmurs, rubs, or gallops.  ABDOMEN: Soft, nontender, nondistended. Bowel sounds present. No organomegaly or mass.  EXTREMITIES: No cyanosis, clubbing or edema b/l.    NEUROLOGIC: Cranial nerves II through XII are intact. No focal Motor or sensory deficits b/l.   PSYCHIATRIC: The patient is alert and oriented x 3.  SKIN: No obvious rash, lesion, or ulcer.    LABORATORY PANEL:   CBC Recent Labs  Lab 02/21/18 2137  WBC 5.6  HGB 12.6  HCT 37.9  PLT 195   ------------------------------------------------------------------------------------------------------------------  Chemistries  Recent Labs  Lab 02/21/18 2137  NA 138  K 3.4*  CL 100*  CO2 27  GLUCOSE 131*  BUN 13  CREATININE 0.65  CALCIUM 9.0  AST 23  ALT 10*  ALKPHOS 36*  BILITOT 0.5   ------------------------------------------------------------------------------------------------------------------  Cardiac Enzymes Recent Labs  Lab 02/21/18 2137  TROPONINI <0.03   ------------------------------------------------------------------------------------------------------------------  RADIOLOGY:  Dg Chest 1 View  Result Date: 02/21/2018 CLINICAL DATA:  Shortness of breath and wheezing. EXAM: CHEST  1 VIEW COMPARISON:  Radiograph 08/20/2017.  Chest CT 08/18/2017 FINDINGS: Prior left lung base opacity is near completely resolved with minimal streaky atelectasis or scarring. Improved aeration of the right costophrenic angle from prior exam. No  new focal airspace disease. Chronic hyperinflation. Heart size  upper normal with tortuous atherosclerotic thoracic aorta. No pulmonary edema. No large pleural effusion. No pneumothorax. No acute osseous abnormalities are seen. IMPRESSION: 1. Chronic hyperinflation with left lower lobe atelectasis or scarring. 2.  Aortic Atherosclerosis (ICD10-I70.0). Electronically Signed   By: Jeb Levering M.D.   On: 02/21/2018 22:00     ASSESSMENT AND PLAN:   84 year old female with past medical history of COPD, hypertension, neuropathy who presents to the hospital due to worsening shortness of breath.  1.  COPD exacerbation- this is a cause of patient's worsening shortness of breath and respiratory distress. -Chest x-ray negative for any acute pneumonia. -Continue prednisone taper, scheduled duo nebs, will add Pulmicort nebs. - Patient needs to be assessed for home oxygen prior to discharge.  2.  Hypokalemia-mild continue supplementation repeat level in the morning.  3.  Hypertension-continue metoprolol.  Possible d/c home tomorrow or later today.   All the records are reviewed and case discussed with Care Management/Social Worker. Management plans discussed with the patient, family and they are in agreement.  CODE STATUS: Full code  DVT Prophylaxis: Lovenox  TOTAL TIME TAKING CARE OF THIS PATIENT: 30 minutes.   POSSIBLE D/C IN 1-2 DAYS, DEPENDING ON CLINICAL CONDITION.   Henreitta Leber M.D on 02/22/2018 at 12:13 PM  Between 7am to 6pm - Pager - [phone]  After 6pm go to [email or web address] - Proofreader  [facility]  Office  [phone]  CC: Primary care physician; Baxter Hire, MD

## 2018-02-22 NOTE — Care Management Note (Signed)
Case Management Note  Patient Details  Name: Janice Hoffman MRN: [id number] Date of Birth: 28-May-1926  Subjective/Objective:   Patient has orders for DME nebulizer. Referral placed with Jermaine from [facility] who is able to provide this. No further needs at this time.               Merrily Pew Shadell Brenn RN BSN RNCM [phone]  Action/Plan:  Patient may ultimately require [place] at discharge but it is still unknown. Will continue to follow for possible needs. Expected Discharge Date:                  Expected Discharge Plan:     In-House Referral:     Discharge planning Services  CM Consult  Post Acute Care Choice:  Durable Medical Equipment Choice offered to:  Patient  DME Arranged:  Nebulizer/meds DME Agency:  [place]:    [place] Agency:     Status of Service:  Completed, signed off  If discussed at Ages of Stay Meetings, dates discussed:    Additional Comments:  Latanya Maudlin, RN 02/22/2018, 11:29 AM

## 2018-02-22 NOTE — Progress Notes (Signed)
Patient complaining of SOB, BP 178/81, HR 89, 98% on RA, respirations 16BPM. Notified Dr. Verdell Carmine, received verbal order for a one time dose of xanax 0.25mg .

## 2018-02-22 NOTE — H&P (Signed)
Janice Hoffman is an 84 y.o. female.   Chief Complaint: Shortness of breath HPI: The patient with past medical history of COPD and hypertension presents to the emergency department via EMS after not getting relief from her nebulizers at home.  The patient states that her dyspnea came on suddenly moderate severity.  She used four breathing treatments at home without relief which prompted her to call 911.  Patient was given Solu-Medrol and multiple duo nebs in route.  In the emergency department she was placed on BiPAP and given magnesium.  She was promptly weaned to nasal cannula in the emergency department staff called the hospitalist service for further management.  Past Medical History:  Diagnosis Date  . Asthma   . COPD (chronic obstructive pulmonary disease) ([place])   . Herpes zoster   . Hypertension   . Neuralgia     Past Surgical History:  Procedure Laterality Date  . LEG SURGERY      Family History  Problem Relation Age of Onset  . COPD Sister    Social History:  reports that she has never smoked. She has never used smokeless tobacco. She reports that she does not drink alcohol or use drugs.  Allergies:  Allergies  Allergen Reactions  . Nsaids Shortness Of Breath    Throat swelling, shortness of breath  . Penicillins     Has patient had a PCN reaction causing immediate rash, facial/tongue/throat swelling, SOB or lightheadedness with hypotension: Unknown Has patient had a PCN reaction causing severe rash involving mucus membranes or skin necrosis: Unknown Has patient had a PCN reaction that required hospitalization: Unknown Has patient had a PCN reaction occurring within the last 10 years: Unknown If all of the above answers are "NO", then may proceed with Cephalosporin use.   . Promethazine Hcl     Medications Prior to Admission  Medication Sig Dispense Refill  . albuterol (PROVENTIL HFA;VENTOLIN HFA) 108 (90 Base) MCG/ACT inhaler Inhale 2 puffs into the lungs every 6  (six) hours as needed for wheezing or shortness of breath.    . cyanocobalamin (,VITAMIN B-12,) 1000 MCG/ML injection Inject 1,000 mcg into the skin every 30 (thirty) days.   2  . Fluticasone-Salmeterol (ADVAIR) 500-50 MCG/DOSE AEPB Inhale 1 puff into the lungs 2 (two) times daily.    Marland Kitchen ipratropium-albuterol (DUONEB) 0.5-2.5 (3) MG/3ML SOLN Take 3 mLs by nebulization every 6 (six) hours as needed.    . metoprolol tartrate (LOPRESSOR) 25 MG tablet Take 1 tablet (25 mg total) by mouth 2 (two) times daily. 60 tablet 2  . traMADol (ULTRAM) 50 MG tablet Take 50 mg by mouth every 6 (six) hours as needed for moderate pain or severe pain. Reported on 03/14/2016    . feeding supplement, ENSURE ENLIVE, (ENSURE ENLIVE) LIQD Take 237 mLs by mouth 2 (two) times daily between meals. 237 mL 12    Results for orders placed or performed during the hospital encounter of 02/21/18 (from the past 48 hour(s))  CBC     Status: Abnormal   Collection Time: 02/21/18  9:37 PM  Result Value Ref Range   WBC 5.6 3.6 - 11.0 K/uL   RBC 4.23 3.80 - 5.20 MIL/uL   Hemoglobin 12.6 12.0 - 16.0 g/dL   HCT 37.9 35.0 - 47.0 %   MCV 89.7 80.0 - 100.0 fL   MCH 29.8 26.0 - 34.0 pg   MCHC 33.2 32.0 - 36.0 g/dL   RDW 16.3 (H) 11.5 - 14.5 %   Platelets  195 150 - 440 K/uL    Comment: Performed at [facility], [address] [place], [place] [address]  Troponin I     Status: None   Collection Time: 02/21/18  9:37 PM  Result Value Ref Range   Troponin I <0.03 <0.03 ng/mL    Comment: Performed at [facility], [address] [place], [place] [address]  Comprehensive metabolic panel     Status: Abnormal   Collection Time: 02/21/18  9:37 PM  Result Value Ref Range   Sodium 138 135 - 145 mmol/L   Potassium 3.4 (L) 3.5 - 5.1 mmol/L   Chloride 100 (L) 101 - 111 mmol/L   CO2 27 22 - 32 mmol/L   Glucose, Bld 131 (H) 65 - 99 mg/dL   BUN 13 6 - 20 mg/dL   Creatinine, Ser 0.65 0.44 - 1.00 mg/dL   Calcium 9.0 8.9 -  10.3 mg/dL   Total Protein 7.0 6.5 - 8.1 g/dL   Albumin 3.9 3.5 - 5.0 g/dL   AST 23 15 - 41 U/L   ALT 10 (L) 14 - 54 U/L   Alkaline Phosphatase 36 (L) 38 - 126 U/L   Total Bilirubin 0.5 0.3 - 1.2 mg/dL   GFR calc non Af Amer >60 >60 mL/min   GFR calc Af Amer >60 >60 mL/min    Comment: (NOTE) The eGFR has been calculated using the CKD EPI equation. This calculation has not been validated in all clinical situations. eGFR's persistently <60 mL/min signify possible Chronic Kidney Disease.    Anion gap 11 5 - 15    Comment: Performed at [facility], [address] [place], [place] [address]  TSH     Status: None   Collection Time: 02/21/18  9:37 PM  Result Value Ref Range   TSH 1.424 0.350 - 4.500 uIU/mL    Comment: Performed by a 3rd Generation assay with a functional sensitivity of <=0.01 uIU/mL. Performed at [facility], [address] [place], [place] [address]   Blood gas, venous     Status: Abnormal (Preliminary result)   Collection Time: 02/21/18  9:39 PM  Result Value Ref Range   FIO2 0.36    Delivery systems NASAL CANNULA    pH, Ven 7.33 7.250 - 7.430   pCO2, Ven 58 44.0 - 60.0 mmHg   pO2, Ven <31.0 (LL) 32.0 - 45.0 mmHg    Comment: CRITICAL RESULT CALLED TO, READ BACK BY AND VERIFIED WITH: APRIL RN  AT 2159 ON 6.15.19 BL    Bicarbonate 29.9 (H) 20.0 - 28.0 mmol/L   Acid-Base Excess 2.5 (H) 0.0 - 2.0 mmol/L   O2 Saturation PENDING %   Patient temperature 36.0    Collection site VENOUS    Sample type VENOUS     Comment: Performed at [facility], [address] [place], [place] [address]  Urinalysis, Complete w Microscopic     Status: Abnormal   Collection Time: 02/22/18  2:12 AM  Result Value Ref Range   Color, Urine YELLOW (A) YELLOW   APPearance CLEAR (A) CLEAR   Specific Gravity, Urine 1.012 1.005 - 1.030   pH 5.0 5.0 - 8.0   Glucose, UA 50 (A) NEGATIVE mg/dL   Hgb urine dipstick SMALL (A) NEGATIVE   Bilirubin Urine NEGATIVE  NEGATIVE   Ketones, ur NEGATIVE NEGATIVE mg/dL   Protein, ur NEGATIVE NEGATIVE mg/dL   Nitrite NEGATIVE NEGATIVE   Leukocytes, UA NEGATIVE NEGATIVE   RBC /  HPF 0-5 0 - 5 RBC/hpf   WBC, UA 0-5 0 - 5 WBC/hpf   Bacteria, UA NONE SEEN NONE SEEN   Squamous Epithelial / LPF NONE SEEN 0 - 5   Mucus PRESENT     Comment: Performed at [facility], [address] [place], [place] [address]   Dg Chest 1 View  Result Date: 02/21/2018 CLINICAL DATA:  Shortness of breath and wheezing. EXAM: CHEST  1 VIEW COMPARISON:  Radiograph 08/20/2017.  Chest CT 08/18/2017 FINDINGS: Prior left lung base opacity is near completely resolved with minimal streaky atelectasis or scarring. Improved aeration of the right costophrenic angle from prior exam. No new focal airspace disease. Chronic hyperinflation. Heart size upper normal with tortuous atherosclerotic thoracic aorta. No pulmonary edema. No large pleural effusion. No pneumothorax. No acute osseous abnormalities are seen. IMPRESSION: 1. Chronic hyperinflation with left lower lobe atelectasis or scarring. 2.  Aortic Atherosclerosis (ICD10-I70.0). Electronically Signed   By: Jeb Levering M.D.   On: 02/21/2018 22:00    Review of Systems  Constitutional: Negative for chills and fever.  HENT: Negative for sore throat and tinnitus.   Eyes: Negative for blurred vision and redness.  Respiratory: Positive for shortness of breath. Negative for cough.   Cardiovascular: Negative for chest pain, palpitations, orthopnea and PND.  Gastrointestinal: Negative for abdominal pain, diarrhea, nausea and vomiting.  Genitourinary: Negative for dysuria, frequency and urgency.  Musculoskeletal: Negative for joint pain and myalgias.  Skin: Negative for rash.       No lesions  Neurological: Negative for speech change, focal weakness and weakness.  Endo/Heme/Allergies: Does not bruise/bleed easily.       No temperature intolerance  Psychiatric/Behavioral: Negative for  depression and suicidal ideas.    Blood pressure (!) 176/86, pulse 93, temperature 98.7 F (37.1 C), temperature source Oral, resp. rate 20, height '5\' 4"'  (1.626 m), weight 49.4 kg (108 lb 14.4 oz), SpO2 100 %. Physical Exam  Vitals reviewed. Constitutional: She is oriented to person, place, and time. She appears well-developed and well-nourished. No distress.  HENT:  Head: Normocephalic and atraumatic.  Mouth/Throat: Oropharynx is clear and moist.  Eyes: Pupils are equal, round, and reactive to light. Conjunctivae and EOM are normal. No scleral icterus.  Neck: Normal range of motion. Neck supple. No JVD present. No tracheal deviation present. No thyromegaly present.  Cardiovascular: Normal rate, regular rhythm and normal heart sounds. Exam reveals no gallop and no friction rub.  No murmur heard. Respiratory: Effort normal. She has wheezes.  GI: Soft. Bowel sounds are normal. She exhibits no distension. There is no tenderness.  Genitourinary:  Genitourinary Comments: Deferred  Musculoskeletal: Normal range of motion. She exhibits no edema.  Lymphadenopathy:    She has no cervical adenopathy.  Neurological: She is alert and oriented to person, place, and time. No cranial nerve deficit. She exhibits normal muscle tone.  Skin: Skin is warm and dry. No rash noted. No erythema.  Psychiatric: She has a normal mood and affect. Her behavior is normal. Judgment and thought content normal.     Assessment/Plan This is a 84 year old female admitted for acute on chronic respiratory failure. 1.  Respiratory failure: Acute on chronic; with hypoxemia.  Secondary to COPD.  The patient has not been on a long-acting anticholinergic.  Will restart Spiriva.  Continue inhaled corticosteroid and long-acting bronchial agonist.  Steroid taper and oxygen evaluation prior to discharge tomorrow. 2.  Hypokalemia: Secondary to multiple albuterol treatments.  Replete potassium.  3.  Hypertension:  Controlled; continue  metoprolol. 4.  Atrial fibrillation: Rate controlled; beta-blocker as above 5.  DVT prophylaxis: Lovenox 6.  GI prophylaxis: None The patient is a full code.  Time spent on admission orders and patient care approximately 45 minutes  Harrie Foreman, MD 02/22/2018, 3:06 AM

## 2018-02-22 NOTE — ED Notes (Signed)
Peanut butter crackers and gingerale provided.

## 2018-02-22 NOTE — Progress Notes (Addendum)
BP 166/80, HR 92, patient requesting oxygen for as needed, respiratory assessed patient, protocol order of 02 2L prn added. Patient states SOB has lessened. 02 96% on 2L.

## 2018-02-22 NOTE — Progress Notes (Signed)
SATURATION QUALIFICATIONS: (This note is used to comply with regulatory documentation for home oxygen)  Patient Saturations on Room Air at Rest =97 %  Patient Saturations on Room Air while Ambulating = 95%    

## 2018-02-22 NOTE — Plan of Care (Signed)
  Problem: Education: Goal: Knowledge of General Education information will improve Outcome: Progressing   Problem: Health Behavior/Discharge Planning: Goal: Ability to manage health-related needs will improve Outcome: Progressing   Problem: Activity: Goal: Risk for activity intolerance will decrease Outcome: Progressing   Problem: Coping: Goal: Level of anxiety will decrease Outcome: Progressing   Problem: Elimination: Goal: Will not experience complications related to bowel motility Outcome: Progressing Goal: Will not experience complications related to urinary retention Outcome: Progressing   Problem: Safety: Goal: Ability to remain free from injury will improve Outcome: Progressing   Problem: Skin Integrity: Goal: Risk for impaired skin integrity will decrease Outcome: Progressing   Problem: Respiratory: Goal: Ability to maintain a clear airway will improve Outcome: Progressing Goal: Levels of oxygenation will improve Outcome: Progressing Goal: Ability to maintain adequate ventilation will improve Outcome: Progressing

## 2018-02-23 MED ORDER — PREDNISONE 10 MG PO TABS: ORAL_TABLET | ORAL | 0 refills | Status: DC

## 2018-02-23 MED ORDER — TIOTROPIUM BROMIDE MONOHYDRATE 18 MCG IN CAPS: 18.0000 ug | ORAL_CAPSULE | Freq: Every day | RESPIRATORY_TRACT | 1 refills | Status: DC

## 2018-02-23 MED ORDER — IPRATROPIUM-ALBUTEROL 0.5-2.5 (3) MG/3ML IN SOLN
3.0000 mL | Freq: Three times a day (TID) | RESPIRATORY_TRACT | Status: DC
  Administered 2018-02-23: 3 mL via RESPIRATORY_TRACT
  Filled 2018-02-23 (×2): qty 3

## 2018-02-23 MED ORDER — IPRATROPIUM-ALBUTEROL 0.5-2.5 (3) MG/3ML IN SOLN: 3.0000 mL | Freq: Four times a day (QID) | RESPIRATORY_TRACT | Status: DC | PRN

## 2018-02-23 MED ORDER — LISINOPRIL 5 MG PO TABS: 5.0000 mg | ORAL_TABLET | Freq: Every day | ORAL | 1 refills | Status: DC

## 2018-02-23 NOTE — Progress Notes (Signed)
Pt for discharge home. Alert. No resp distress, discharge instructions discussed with pt and pts son. Diet/ activity . F/u discussed with son. presc given and discussed.  Home via w/c at this time no voiced c/o.  Sl d/cd.

## 2018-02-23 NOTE — Care Management (Signed)
Discharge to home today per Dr. Heron Sabins. Discussed Home Health agencies. Would like [facility]. Floydene Flock, [facility] representative update. Son will transport Shelbie Ammons RN MSN [place] [facility] [phone]

## 2018-02-23 NOTE — Discharge Summary (Signed)
[facility] [place] NAME: Janice Hoffman    MR#:  [id number]  DATE OF BIRTH:  [birth year]  DATE OF ADMISSION:  02/21/2018 ADMITTING PHYSICIAN: Harrie Foreman, MD  DATE OF DISCHARGE: 02/23/2018  PRIMARY CARE PHYSICIAN: Baxter Hire, MD    ADMISSION DIAGNOSIS:  COPD exacerbation ([place]) [J44.1] Acute respiratory failure with hypoxia ([place]) [J96.01]  DISCHARGE DIAGNOSIS:  Active Problems:   Acute on chronic respiratory failure with hypoxemia ([place])   SECONDARY DIAGNOSIS:   Past Medical History:  Diagnosis Date  . Asthma   . COPD (chronic obstructive pulmonary disease) ([place])   . Herpes zoster   . Hypertension   . Neuralgia     HOSPITAL COURSE:   84 year old female with past medical history of COPD, hypertension, neuropathy who presents to the hospital due to worsening shortness of breath.  1.  COPD exacerbation- this was the cause of patient's worsening shortness of breath and respiratory distress. - on admission pt's Chest x-ray was negative for any acute pneumonia.  Most of patient's symptoms were likely anxiety mediated. -Patient was empirically placed on a prednisone taper along with scheduled duo nebs and Pulmicort nebs and has improved. - Patient is now being discharged on a small taper of prednisone along with maintenance of her inhalers.  Did not qualify for home oxygen. - pt. Will cont. Advair and Spiriva was added to his Regimen.    2.  Hypokalemia- improved with supplementation.  3.  Hypertension- she will continue her lisinopril, metoprolol.  She is being discharged home with Home health services.   DISCHARGE CONDITIONS:   Stable.   CONSULTS OBTAINED:    DRUG ALLERGIES:   Allergies  Allergen Reactions  . Nsaids Shortness Of Breath    Throat swelling, shortness of breath  . Penicillins     Has patient had a PCN reaction causing immediate rash, facial/tongue/throat swelling, SOB or lightheadedness with  hypotension: Unknown Has patient had a PCN reaction causing severe rash involving mucus membranes or skin necrosis: Unknown Has patient had a PCN reaction that required hospitalization: Unknown Has patient had a PCN reaction occurring within the last 10 years: Unknown If all of the above answers are "NO", then may proceed with Cephalosporin use.   . Promethazine Hcl     DISCHARGE MEDICATIONS:   Allergies as of 02/23/2018      Reactions   Nsaids Shortness Of Breath   Throat swelling, shortness of breath   Penicillins    Has patient had a PCN reaction causing immediate rash, facial/tongue/throat swelling, SOB or lightheadedness with hypotension: Unknown Has patient had a PCN reaction causing severe rash involving mucus membranes or skin necrosis: Unknown Has patient had a PCN reaction that required hospitalization: Unknown Has patient had a PCN reaction occurring within the last 10 years: Unknown If all of the above answers are "NO", then may proceed with Cephalosporin use.   Promethazine Hcl       Medication List    TAKE these medications   albuterol 108 (90 Base) MCG/ACT inhaler Commonly known as:  PROVENTIL HFA;VENTOLIN HFA Inhale 2 puffs into the lungs every 6 (six) hours as needed for wheezing or shortness of breath.   cyanocobalamin 1000 MCG/ML injection Commonly known as:  (VITAMIN B-12) Inject 1,000 mcg into the skin every 30 (thirty) days.   feeding supplement (ENSURE ENLIVE) Liqd Take 237 mLs by mouth 2 (two) times daily between meals.   Fluticasone-Salmeterol 500-50 MCG/DOSE Aepb Commonly known as:  ADVAIR Inhale 1 puff into the lungs 2 (two) times daily.   ipratropium-albuterol 0.5-2.5 (3) MG/3ML Soln Commonly known as:  DUONEB Take 3 mLs by nebulization every 6 (six) hours as needed.   lisinopril 5 MG tablet Commonly known as:  PRINIVIL,ZESTRIL Take 1 tablet (5 mg total) by mouth daily.   metoprolol tartrate 25 MG tablet Commonly known as:  LOPRESSOR Take  1 tablet (25 mg total) by mouth 2 (two) times daily.   predniSONE 10 MG tablet Commonly known as:  DELTASONE Label  & dispense according to the schedule below. 4 Pills PO for 1 day, 3 Pills PO for 1 day, 2 Pills PO for 1 day, 1 Pill PO for 1 days then STOP.   tiotropium 18 MCG inhalation capsule Commonly known as:  SPIRIVA HANDIHALER Place 1 capsule (18 mcg total) into inhaler and inhale daily.   traMADol 50 MG tablet Commonly known as:  ULTRAM Take 50 mg by mouth every 6 (six) hours as needed for moderate pain or severe pain. Reported on 03/14/2016            Durable Medical Equipment  (From admission, onward)        Start     Ordered   02/22/18 0944  For home use only DME Nebulizer machine  Once    Question:  Patient needs a nebulizer to treat with the following condition  Answer:  COPD (chronic obstructive pulmonary disease) ([place])   02/22/18 0943        DISCHARGE INSTRUCTIONS:   DIET:  Cardiac diet  DISCHARGE CONDITION:  Stable  ACTIVITY:  Activity as tolerated  OXYGEN:  Home Oxygen: No.   Oxygen Delivery: room air  DISCHARGE LOCATION:  Home with [facility], RN.    If you experience worsening of your admission symptoms, develop shortness of breath, life threatening emergency, suicidal or homicidal thoughts you must seek medical attention immediately by calling 911 or calling your MD immediately  if symptoms less severe.  You Must read complete instructions/literature along with all the possible adverse reactions/side effects for all the Medicines you take and that have been prescribed to you. Take any new Medicines after you have completely understood and accpet all the possible adverse reactions/side effects.   Please note  You were cared for by a hospitalist during your hospital stay. If you have any questions about your discharge medications or the care you received while you were in the hospital after you are discharged, you can call the unit and  asked to speak with the hospitalist on call if the hospitalist that took care of you is not available. Once you are discharged, your primary care physician will handle any further medical issues. Please note that NO REFILLS for any discharge medications will be authorized once you are discharged, as it is imperative that you return to your primary care physician (or establish a relationship with a primary care physician if you do not have one) for your aftercare needs so that they can reassess your need for medications and monitor your lab values.     Today   Shortness of breath improved since admission.  No other acute events overnight.  Will discharge home on oral prednisone, Spiriva and changes to her blood pressure meds.  Patient is being discharged home with home health services and patient and son are in agreement.  VITAL SIGNS:  Blood pressure (!) 176/65, pulse 70, temperature (!) 97.2 F (36.2 C), temperature source Oral, resp. rate Marland Kitchen)  22, height 5\' 4"  (1.626 m), weight 52.5 kg (115 lb 12.8 oz), SpO2 98 %.  I/O:    Intake/Output Summary (Last 24 hours) at 02/23/2018 1331 Last data filed at 02/23/2018 1057 Gross per 24 hour  Intake 480 ml  Output -  Net 480 ml    PHYSICAL EXAMINATION:   GENERAL:  84 y.o.-year-old patient lying in bed in no acute distress.  EYES: Pupils equal, round, reactive to light and accommodation. No scleral icterus. Extraocular muscles intact.  HEENT: Head atraumatic, normocephalic. Oropharynx and nasopharynx clear.  NECK:  Supple, no jugular venous distention. No thyroid enlargement, no tenderness.  LUNGS: Normal breath sounds bilaterally, no wheezing, No rales, rhonchi. No use of accessory muscles of respiration.  CARDIOVASCULAR: S1, S2 normal. No murmurs, rubs, or gallops.  ABDOMEN: Soft, nontender, nondistended. Bowel sounds present. No organomegaly or mass.  EXTREMITIES: No cyanosis, clubbing or edema b/l.    NEUROLOGIC: Cranial nerves II through  XII are intact. No focal Motor or sensory deficits b/l.   PSYCHIATRIC: The patient is alert and oriented x 3.  SKIN: No obvious rash, lesion, or ulcer.   DATA REVIEW:   CBC Recent Labs  Lab 02/21/18 2137  WBC 5.6  HGB 12.6  HCT 37.9  PLT 195    Chemistries  Recent Labs  Lab 02/21/18 2137  NA 138  K 3.4*  CL 100*  CO2 27  GLUCOSE 131*  BUN 13  CREATININE 0.65  CALCIUM 9.0  AST 23  ALT 10*  ALKPHOS 36*  BILITOT 0.5    Cardiac Enzymes Recent Labs  Lab 02/21/18 2137  TROPONINI <0.03    Microbiology Results  Results for orders placed or performed during the hospital encounter of 08/18/17  Blood culture (routine x 2)     Status: None   Collection Time: 08/18/17  7:55 PM  Result Value Ref Range Status   Specimen Description BLOOD LEFT AC  Final   Special Requests   Final    BOTTLES DRAWN AEROBIC AND ANAEROBIC Blood Culture results may not be optimal due to an excessive volume of blood received in culture bottles   Culture NO GROWTH 5 DAYS  Final   Report Status 08/23/2017 FINAL  Final  Blood culture (routine x 2)     Status: None   Collection Time: 08/18/17  7:55 PM  Result Value Ref Range Status   Specimen Description BLOOD RIGHT AC  Final   Special Requests   Final    BOTTLES DRAWN AEROBIC AND ANAEROBIC Blood Culture results may not be optimal due to an excessive volume of blood received in culture bottles   Culture NO GROWTH 5 DAYS  Final   Report Status 08/23/2017 FINAL  Final    RADIOLOGY:  Dg Chest 1 View  Result Date: 02/21/2018 CLINICAL DATA:  Shortness of breath and wheezing. EXAM: CHEST  1 VIEW COMPARISON:  Radiograph 08/20/2017.  Chest CT 08/18/2017 FINDINGS: Prior left lung base opacity is near completely resolved with minimal streaky atelectasis or scarring. Improved aeration of the right costophrenic angle from prior exam. No new focal airspace disease. Chronic hyperinflation. Heart size upper normal with tortuous atherosclerotic thoracic aorta.  No pulmonary edema. No large pleural effusion. No pneumothorax. No acute osseous abnormalities are seen. IMPRESSION: 1. Chronic hyperinflation with left lower lobe atelectasis or scarring. 2.  Aortic Atherosclerosis (ICD10-I70.0). Electronically Signed   By: Jeb Levering M.D.   On: 02/21/2018 22:00      Management plans discussed with the patient,  family and they are in agreement.  CODE STATUS:     Code Status Orders  (From admission, onward)        Start     Ordered   02/22/18 0108  Full code  Continuous     02/22/18 0107      TOTAL TIME TAKING CARE OF THIS PATIENT: 40 minutes.    Henreitta Leber M.D on 02/23/2018 at 1:31 PM  Between 7am to 6pm - Pager - [phone]  After 6pm go to [email or web address] - Proofreader  [facility]  Office  [phone]  CC: Primary care physician; Baxter Hire, MD

## 2018-02-23 NOTE — Plan of Care (Signed)
  Problem: Education: Goal: Knowledge of General Education information will improve Outcome: Progressing   Problem: Health Behavior/Discharge Planning: Goal: Ability to manage health-related needs will improve Outcome: Progressing   Problem: Activity: Goal: Risk for activity intolerance will decrease Outcome: Progressing   Problem: Coping: Goal: Level of anxiety will decrease Outcome: Progressing   Problem: Elimination: Goal: Will not experience complications related to bowel motility Outcome: Progressing Goal: Will not experience complications related to urinary retention Outcome: Progressing   Problem: Safety: Goal: Ability to remain free from injury will improve Outcome: Progressing   Problem: Skin Integrity: Goal: Risk for impaired skin integrity will decrease Outcome: Progressing   Problem: Respiratory: Goal: Ability to maintain a clear airway will improve Outcome: Progressing Goal: Levels of oxygenation will improve Outcome: Progressing Goal: Ability to maintain adequate ventilation will improve Outcome: Progressing

## 2018-03-03 LAB — BLOOD GAS, VENOUS
Acid-Base Excess: 2.5 mmol/L — ABNORMAL HIGH (ref 0.0–2.0)
BICARBONATE: 29.9 mmol/L — AB (ref 20.0–28.0)
FIO2: 0.36
PATIENT TEMPERATURE: 36
pCO2, Ven: 58 mmHg (ref 44.0–60.0)
pH, Ven: 7.33 (ref 7.250–7.430)

## 2018-03-13 ENCOUNTER — Encounter: Payer: Self-pay | Admitting: *Deleted

## 2018-03-13 ENCOUNTER — Observation Stay
Admission: EM | Admit: 2018-03-13 | Discharge: 2018-03-15 | Disposition: A | Discharge status: Home / Self Care | Payer: Medicare Other | Attending: Internal Medicine | Admitting: Internal Medicine

## 2018-03-13 ENCOUNTER — Emergency Department: Payer: Medicare Other

## 2018-03-13 ENCOUNTER — Other Ambulatory Visit: Payer: Self-pay

## 2018-03-13 DIAGNOSIS — J441 Chronic obstructive pulmonary disease with (acute) exacerbation: Principal | ICD-10-CM | POA: Insufficient documentation

## 2018-03-13 DIAGNOSIS — R0603 Acute respiratory distress: Secondary | ICD-10-CM | POA: Diagnosis present

## 2018-03-13 DIAGNOSIS — Z881 Allergy status to other antibiotic agents status: Secondary | ICD-10-CM | POA: Diagnosis not present

## 2018-03-13 DIAGNOSIS — J9621 Acute and chronic respiratory failure with hypoxia: Secondary | ICD-10-CM | POA: Insufficient documentation

## 2018-03-13 DIAGNOSIS — Z79899 Other long term (current) drug therapy: Secondary | ICD-10-CM | POA: Diagnosis not present

## 2018-03-13 DIAGNOSIS — Z886 Allergy status to analgesic agent status: Secondary | ICD-10-CM | POA: Insufficient documentation

## 2018-03-13 DIAGNOSIS — Z882 Allergy status to sulfonamides status: Secondary | ICD-10-CM | POA: Diagnosis not present

## 2018-03-13 DIAGNOSIS — R531 Weakness: Secondary | ICD-10-CM | POA: Diagnosis not present

## 2018-03-13 DIAGNOSIS — Z880 Allergy status to penicillin: Secondary | ICD-10-CM | POA: Insufficient documentation

## 2018-03-13 DIAGNOSIS — M792 Neuralgia and neuritis, unspecified: Secondary | ICD-10-CM | POA: Diagnosis not present

## 2018-03-13 DIAGNOSIS — I10 Essential (primary) hypertension: Secondary | ICD-10-CM | POA: Diagnosis not present

## 2018-03-13 DIAGNOSIS — Z885 Allergy status to narcotic agent status: Secondary | ICD-10-CM | POA: Insufficient documentation

## 2018-03-13 DIAGNOSIS — J449 Chronic obstructive pulmonary disease, unspecified: Secondary | ICD-10-CM | POA: Diagnosis present

## 2018-03-13 LAB — BLOOD GAS, VENOUS
Acid-base deficit: 4.9 mmol/L — ABNORMAL HIGH (ref 0.0–2.0)
Bicarbonate: 20.6 mmol/L (ref 20.0–28.0)
O2 Saturation: 58.2 %
PH VEN: 7.33 (ref 7.250–7.430)
Patient temperature: 37
pCO2, Ven: 39 mmHg — ABNORMAL LOW (ref 44.0–60.0)
pO2, Ven: 33 mmHg (ref 32.0–45.0)

## 2018-03-13 LAB — CBC WITH DIFFERENTIAL/PLATELET
BASOS ABS: 0.1 10*3/uL (ref 0–0.1)
BASOS PCT: 1 %
EOS ABS: 0.7 10*3/uL (ref 0–0.7)
EOS PCT: 14 %
HEMATOCRIT: 40.7 % (ref 35.0–47.0)
Hemoglobin: 14.3 g/dL (ref 12.0–16.0)
Lymphocytes Relative: 34 %
Lymphs Abs: 1.8 10*3/uL (ref 1.0–3.6)
MCH: 31.1 pg (ref 26.0–34.0)
MCHC: 35 g/dL (ref 32.0–36.0)
MCV: 88.9 fL (ref 80.0–100.0)
Monocytes Absolute: 0.5 10*3/uL (ref 0.2–0.9)
Monocytes Relative: 9 %
NEUTROS ABS: 2.2 10*3/uL (ref 1.4–6.5)
Neutrophils Relative %: 42 %
PLATELETS: 190 10*3/uL (ref 150–440)
RBC: 4.58 MIL/uL (ref 3.80–5.20)
RDW: 15.5 % — ABNORMAL HIGH (ref 11.5–14.5)
WBC: 5.3 10*3/uL (ref 3.6–11.0)

## 2018-03-13 LAB — COMPREHENSIVE METABOLIC PANEL
ALBUMIN: 4.1 g/dL (ref 3.5–5.0)
ALT: 11 U/L (ref 0–44)
ANION GAP: 9 (ref 5–15)
AST: 18 U/L (ref 15–41)
Alkaline Phosphatase: 34 U/L — ABNORMAL LOW (ref 38–126)
BUN: 14 mg/dL (ref 8–23)
CHLORIDE: 102 mmol/L (ref 98–111)
CO2: 28 mmol/L (ref 22–32)
Calcium: 9 mg/dL (ref 8.9–10.3)
Creatinine, Ser: 0.61 mg/dL (ref 0.44–1.00)
GFR calc Af Amer: >60 mL/min (ref >60)
GFR calc non Af Amer: >60 mL/min (ref >60)
GLUCOSE: 111 mg/dL — AB (ref 70–99)
POTASSIUM: 3.7 mmol/L (ref 3.5–5.1)
SODIUM: 139 mmol/L (ref 135–145)
Total Bilirubin: 0.6 mg/dL (ref 0.3–1.2)
Total Protein: 7.4 g/dL (ref 6.5–8.1)

## 2018-03-13 LAB — TROPONIN I

## 2018-03-13 LAB — BRAIN NATRIURETIC PEPTIDE: B NATRIURETIC PEPTIDE 5: 360 pg/mL — AB (ref 0.0–100.0)

## 2018-03-13 MED ORDER — IPRATROPIUM-ALBUTEROL 0.5-2.5 (3) MG/3ML IN SOLN
3.0000 mL | Freq: Once | RESPIRATORY_TRACT | Status: AC
  Administered 2018-03-13: 3 mL via RESPIRATORY_TRACT
  Filled 2018-03-13: qty 3

## 2018-03-13 MED ORDER — METHYLPREDNISOLONE SODIUM SUCC 125 MG IJ SOLR
125.0000 mg | Freq: Once | INTRAMUSCULAR | Status: AC
  Administered 2018-03-13: 125 mg via INTRAVENOUS
  Filled 2018-03-13: qty 2

## 2018-03-13 NOTE — ED Provider Notes (Signed)
[facility] Emergency Department Provider Note       Time seen: ----------------------------------------- 9:56 PM on 03/13/2018 -----------------------------------------   I have reviewed the triage vital signs and the nursing notes.  HISTORY   Chief Complaint Respiratory Distress    HPI Janice Hoffman is a 84 y.o. female with a history of asthma, COPD, hypertension and neuralgia who presents to the ED for respiratory distress.  Patient was receiving a breathing treatment on arrival to the ER.  Patient states the medication did not help her breathing at all.  She reports sudden onset of her symptoms.  She states she has had this happen to her before.  She denies fevers, chills or other complaints.  Past Medical History:  Diagnosis Date  . Asthma   . COPD (chronic obstructive pulmonary disease) ([place])   . Herpes zoster   . Hypertension   . Neuralgia     Patient Active Problem List   Diagnosis Date Noted  . Acute on chronic respiratory failure with hypoxemia ([place]) 02/22/2018  . CAP (community acquired pneumonia) 08/18/2017  . New onset a-fib ([place]) 06/15/2017  . Near syncope 05/31/2017  . COPD with acute exacerbation ([place]) 01/05/2017  . Essential hypertension, malignant 01/05/2017  . Dysphagia 01/05/2017  . COPD exacerbation ([place]) 03/15/2016  . 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 ELEVATED BLOOD PRESSURE 05/12/2008  . HERPES ZOSTER 04/26/2008  . B12 DEFICIENCY 04/26/2008  . CONSTIPATION 04/26/2008  . LEG EDEMA, BILATERAL 04/26/2008    Past Surgical History:  Procedure Laterality Date  . LEG SURGERY      Allergies Nsaids; Penicillins; and Promethazine hcl  Social History Social History   Tobacco Use  . Smoking status: Never Smoker  . Smokeless tobacco: Never Used  Substance Use Topics  . Alcohol use: No    Alcohol/week: 0.0 oz  . Drug use: No   Review of Systems Constitutional: Negative for fever. Cardiovascular: Negative for  chest pain. Respiratory: Positive for shortness of breath, negative for cough Gastrointestinal: Negative for abdominal pain, vomiting and diarrhea. Genitourinary: Negative for dysuria. Musculoskeletal: Negative for back pain. Skin: Negative for rash. Neurological: Negative for headaches, focal weakness or numbness.  All systems negative/normal/unremarkable except as stated in the HPI  ____________________________________________   PHYSICAL EXAM:  VITAL SIGNS: ED Triage Vitals [03/13/18 2153]  Enc Vitals Group     BP      Pulse      Resp      Temp      Temp src      SpO2      Weight 115 lb (52.2 kg)     Height 5\' 4"  (1.626 m)     Head Circumference      Peak Flow      Pain Score 0     Pain Loc      Pain Edu?      Excl. in Center?    Constitutional: Alert and oriented.  Mild distress Eyes: Conjunctivae are normal. Normal extraocular movements. ENT   Head: Normocephalic and atraumatic.   Nose: No congestion/rhinnorhea.   Mouth/Throat: Mucous membranes are moist.   Neck: No stridor. Cardiovascular: Normal rate, regular rhythm. No murmurs, rubs, or gallops. Respiratory: Tachypnea with wheezing bilaterally Gastrointestinal: Soft and nontender. Normal bowel sounds Musculoskeletal: Nontender with normal range of motion in extremities. No lower extremity tenderness nor edema. Neurologic:  Normal speech and language. No gross focal neurologic deficits are appreciated.  Skin:  Skin is warm, dry and intact. No rash noted.  Psychiatric: Mood and affect are normal. Speech and behavior are normal.  ____________________________________________  EKG: Interpreted by me.  Sinus rhythm the rate of 92 bpm, left bundle branch block, normal axis, long QT  ____________________________________________  ED COURSE:  As part of my medical decision making, I reviewed the following data within the [place] History obtained from family if available, nursing notes,  old chart and ekg, as well as notes from prior ED visits. Patient presented for dyspnea, we will assess with labs and imaging as indicated at this time.   Procedures ____________________________________________   LABS (pertinent positives/negatives)  Labs Reviewed  CBC WITH DIFFERENTIAL/PLATELET - Abnormal; Notable for the following components:      Result Value   RDW 15.5 (*)    All other components within normal limits  BLOOD GAS, VENOUS - Abnormal; Notable for the following components:   pCO2, Ven 39 (*)    Acid-base deficit 4.9 (*)    All other components within normal limits  COMPREHENSIVE METABOLIC PANEL - Abnormal; Notable for the following components:   Glucose, Bld 111 (*)    Alkaline Phosphatase 34 (*)    All other components within normal limits  TROPONIN I  BRAIN NATRIURETIC PEPTIDE    RADIOLOGY Images were viewed by me  Chest x-ray IMPRESSION: No active disease. Hyperinflation with stable coarse chronic interstitial opacity.  ____________________________________________  DIFFERENTIAL DIAGNOSIS   COPD, pneumonia, PE, pneumothorax, CHF  FINAL ASSESSMENT AND PLAN  COPD exacerbation, hypertension   Plan: The patient had presented for shortness of breath. Patient's labs did not reveal any acute process. Patient's imaging unremarkable.  She is still having significant wheezing.  Final disposition is pending at this time.   Laurence Aly, MD   Note: This note was generated in part or whole with voice recognition software. Voice recognition is usually quite accurate but there are transcription errors that can and very often do occur. I apologize for any typographical errors that were not detected and corrected.     Earleen Newport, MD 03/13/18 2242

## 2018-03-13 NOTE — ED Triage Notes (Signed)
Pt brought in via ems from home with resp distress.  Pt receiving breathing treatment on arrival to er.  Pt alert.

## 2018-03-13 NOTE — ED Notes (Signed)
Family in with pt now.  Pt alert.  nsr on monitor.

## 2018-03-14 ENCOUNTER — Encounter: Payer: Self-pay | Admitting: Internal Medicine

## 2018-03-14 ENCOUNTER — Other Ambulatory Visit: Payer: Self-pay

## 2018-03-14 DIAGNOSIS — J441 Chronic obstructive pulmonary disease with (acute) exacerbation: Secondary | ICD-10-CM

## 2018-03-14 DIAGNOSIS — J449 Chronic obstructive pulmonary disease, unspecified: Secondary | ICD-10-CM | POA: Diagnosis present

## 2018-03-14 HISTORY — DX: Chronic obstructive pulmonary disease with (acute) exacerbation: J44.1

## 2018-03-14 MED ORDER — METHYLPREDNISOLONE SODIUM SUCC 125 MG IJ SOLR
60.0000 mg | Freq: Two times a day (BID) | INTRAMUSCULAR | Status: AC
  Administered 2018-03-14 (×2): 60 mg via INTRAVENOUS
  Filled 2018-03-14 (×2): qty 2

## 2018-03-14 MED ORDER — ENSURE ENLIVE PO LIQD
237.0000 mL | Freq: Two times a day (BID) | ORAL | Status: DC
  Administered 2018-03-14 – 2018-03-15 (×3): 237 mL via ORAL

## 2018-03-14 MED ORDER — IPRATROPIUM-ALBUTEROL 0.5-2.5 (3) MG/3ML IN SOLN: 3.0000 mL | Freq: Once | RESPIRATORY_TRACT | Status: DC

## 2018-03-14 MED ORDER — PREDNISONE 20 MG PO TABS
40.0000 mg | ORAL_TABLET | Freq: Every day | ORAL | Status: DC
  Administered 2018-03-15: 40 mg via ORAL
  Filled 2018-03-14: qty 2

## 2018-03-14 MED ORDER — BENZONATATE 100 MG PO CAPS
200.0000 mg | ORAL_CAPSULE | Freq: Three times a day (TID) | ORAL | Status: DC | PRN
  Administered 2018-03-14: 200 mg via ORAL
  Filled 2018-03-14: qty 2

## 2018-03-14 MED ORDER — METOPROLOL TARTRATE 25 MG PO TABS
25.0000 mg | ORAL_TABLET | Freq: Two times a day (BID) | ORAL | Status: DC
  Administered 2018-03-14 – 2018-03-15 (×3): 25 mg via ORAL
  Filled 2018-03-14 (×3): qty 1

## 2018-03-14 MED ORDER — ONDANSETRON HCL 4 MG PO TABS: 4.0000 mg | ORAL_TABLET | Freq: Four times a day (QID) | ORAL | Status: DC | PRN

## 2018-03-14 MED ORDER — MAGNESIUM SULFATE 2 GM/50ML IV SOLN
2.0000 g | Freq: Once | INTRAVENOUS | Status: AC
  Administered 2018-03-14: 2 g via INTRAVENOUS
  Filled 2018-03-14: qty 50

## 2018-03-14 MED ORDER — IPRATROPIUM-ALBUTEROL 0.5-2.5 (3) MG/3ML IN SOLN
3.0000 mL | RESPIRATORY_TRACT | Status: AC
  Administered 2018-03-14 (×5): 3 mL via RESPIRATORY_TRACT
  Filled 2018-03-14 (×5): qty 3

## 2018-03-14 MED ORDER — ALBUTEROL SULFATE (2.5 MG/3ML) 0.083% IN NEBU
10.0000 mg | INHALATION_SOLUTION | Freq: Once | RESPIRATORY_TRACT | Status: AC
  Administered 2018-03-14: 10 mg via RESPIRATORY_TRACT
  Filled 2018-03-14: qty 12

## 2018-03-14 MED ORDER — OXYCODONE HCL 5 MG PO TABS
5.0000 mg | ORAL_TABLET | ORAL | Status: DC | PRN
  Administered 2018-03-14 – 2018-03-15 (×3): 5 mg via ORAL
  Filled 2018-03-14 (×3): qty 1

## 2018-03-14 MED ORDER — ONDANSETRON HCL 4 MG/2ML IJ SOLN: 4.0000 mg | Freq: Four times a day (QID) | INTRAMUSCULAR | Status: DC | PRN

## 2018-03-14 MED ORDER — PREDNISONE 50 MG PO TABS: 50.0000 mg | ORAL_TABLET | Freq: Every day | ORAL | 0 refills | Status: DC

## 2018-03-14 MED ORDER — PREDNISONE 20 MG PO TABS: 40.0000 mg | ORAL_TABLET | Freq: Every day | ORAL | Status: DC

## 2018-03-14 MED ORDER — ACETAMINOPHEN 325 MG PO TABS
650.0000 mg | ORAL_TABLET | Freq: Four times a day (QID) | ORAL | Status: DC | PRN
  Administered 2018-03-14 (×2): 650 mg via ORAL
  Filled 2018-03-14 (×2): qty 2

## 2018-03-14 MED ORDER — HEPARIN SODIUM (PORCINE) 5000 UNIT/ML IJ SOLN
5000.0000 [IU] | Freq: Three times a day (TID) | INTRAMUSCULAR | Status: DC
  Administered 2018-03-14 – 2018-03-15 (×4): 5000 [IU] via SUBCUTANEOUS
  Filled 2018-03-14 (×4): qty 1

## 2018-03-14 MED ORDER — MOMETASONE FURO-FORMOTEROL FUM 200-5 MCG/ACT IN AERO
2.0000 | INHALATION_SPRAY | Freq: Two times a day (BID) | RESPIRATORY_TRACT | Status: DC
  Administered 2018-03-14 – 2018-03-15 (×3): 2 via RESPIRATORY_TRACT
  Filled 2018-03-14: qty 8.8

## 2018-03-14 MED ORDER — ACETAMINOPHEN 650 MG RE SUPP: 650.0000 mg | Freq: Four times a day (QID) | RECTAL | Status: DC | PRN

## 2018-03-14 MED ORDER — BISACODYL 5 MG PO TBEC: 5.0000 mg | DELAYED_RELEASE_TABLET | Freq: Every day | ORAL | Status: DC | PRN

## 2018-03-14 MED ORDER — ENOXAPARIN SODIUM 40 MG/0.4ML ~~LOC~~ SOLN: 40.0000 mg | SUBCUTANEOUS | Status: DC

## 2018-03-14 MED ORDER — LEVALBUTEROL HCL 1.25 MG/0.5ML IN NEBU
1.2500 mg | INHALATION_SOLUTION | Freq: Four times a day (QID) | RESPIRATORY_TRACT | Status: DC | PRN
  Administered 2018-03-14 – 2018-03-15 (×2): 1.25 mg via RESPIRATORY_TRACT
  Filled 2018-03-14 (×3): qty 0.5

## 2018-03-14 MED ORDER — SENNOSIDES-DOCUSATE SODIUM 8.6-50 MG PO TABS: 1.0000 | ORAL_TABLET | Freq: Every evening | ORAL | Status: DC | PRN

## 2018-03-14 MED ORDER — AZITHROMYCIN 250 MG PO TABS: ORAL_TABLET | ORAL | 0 refills | Status: DC

## 2018-03-14 MED ORDER — IPRATROPIUM-ALBUTEROL 0.5-2.5 (3) MG/3ML IN SOLN
3.0000 mL | Freq: Four times a day (QID) | RESPIRATORY_TRACT | Status: DC
  Administered 2018-03-15 (×2): 3 mL via RESPIRATORY_TRACT
  Filled 2018-03-14 (×2): qty 3

## 2018-03-14 MED ORDER — TRAMADOL HCL 50 MG PO TABS: 50.0000 mg | ORAL_TABLET | Freq: Four times a day (QID) | ORAL | Status: DC | PRN

## 2018-03-14 MED ORDER — LISINOPRIL 5 MG PO TABS
5.0000 mg | ORAL_TABLET | Freq: Every day | ORAL | Status: DC
  Administered 2018-03-14 – 2018-03-15 (×2): 5 mg via ORAL
  Filled 2018-03-14 (×2): qty 1

## 2018-03-14 NOTE — Progress Notes (Signed)
A/P: 6F acute on chronic hypoxemic respiratory failure/acute COPD exacerbation. Continue IV steroid, NEB PRN.  Continue current treatment.  I discussed with the patient, her son, Therapist, sports.  Time spent 33 minutes.

## 2018-03-14 NOTE — H&P (Addendum)
[facility] NAME: Janice Hoffman    MR#:  [id number]  DATE OF BIRTH:  1926/01/05  DATE OF ADMISSION:  03/13/2018  PRIMARY CARE PHYSICIAN: Baxter Hire, MD   REQUESTING/REFERRING PHYSICIAN: Darel Hong, MD  CHIEF COMPLAINT:   Chief Complaint  Patient presents with  . Respiratory Distress    HISTORY OF PRESENT ILLNESS:  Janice Hoffman  is a 84 y.o. female with a known history of COPD who p/w SOB/respiratory distress, acute hypoxemic respiratory failure/acute COPD exacerbation. Pt is AAOx3, very pleasant, but not a particularly good historian. She first tells me that she got SOB on 03/13/2018 PM (@~2100) after a coughing spell, but then she tells me she has spent majority of the past two days in bed due to shortness of breath and decreased exercise tolerance. Ultimately, it is unclear to me the duration of symptoms or the precipitant. Endorses dry non-productive cough. Denies CP. She called EMS. At the time of my assessment, she has wheezing, labored breathing and conversational dyspnea, w/ mild respiratory distress, but she does not exhibit tripoding, retractions or accessory muscle use. She has a stable SpO2 on [place]. Apart from aforementioned dyspnea, she is otherwise rather well-appearing and does not endorse any other complaints.  PAST MEDICAL HISTORY:   Past Medical History:  Diagnosis Date  . Asthma   . COPD (chronic obstructive pulmonary disease) ([place])   . Herpes zoster   . Hypertension   . Neuralgia     PAST SURGICAL HISTORY:   Past Surgical History:  Procedure Laterality Date  . LEG SURGERY      SOCIAL HISTORY:   Social History   Tobacco Use  . Smoking status: Never Smoker  . Smokeless tobacco: Never Used  Substance Use Topics  . Alcohol use: No    Alcohol/week: 0.0 oz    FAMILY HISTORY:   Family History  Problem Relation Age of Onset  . COPD Sister     DRUG ALLERGIES:   Allergies  Allergen  Reactions  . Aspirin Shortness Of Breath  . Nsaids Shortness Of Breath    Throat swelling, shortness of breath  . Sulfa Antibiotics Other (See Comments)    Stomach pain Stomach pain   . Codeine Other (See Comments)    Short of breath   . Ciprofloxacin Hives  . Penicillins     Has patient had a PCN reaction causing immediate rash, facial/tongue/throat swelling, SOB or lightheadedness with hypotension: Unknown Has patient had a PCN reaction causing severe rash involving mucus membranes or skin necrosis: Unknown Has patient had a PCN reaction that required hospitalization: Unknown Has patient had a PCN reaction occurring within the last 10 years: Unknown If all of the above answers are "NO", then may proceed with Cephalosporin use.   . Promethazine Hcl     REVIEW OF SYSTEMS:   Review of Systems  Constitutional: Negative for chills, diaphoresis, fever, malaise/fatigue and weight loss.  HENT: Negative for congestion, ear pain, hearing loss, nosebleeds, sinus pain, sore throat and tinnitus.   Eyes: Negative for blurred vision, double vision and photophobia.  Respiratory: Positive for cough, shortness of breath and wheezing. Negative for hemoptysis and sputum production.   Cardiovascular: Negative for chest pain, palpitations, orthopnea, claudication, leg swelling and PND.  Gastrointestinal: Negative for abdominal pain, blood in stool, constipation, diarrhea, heartburn, melena, nausea and vomiting.  Genitourinary: Negative for dysuria, frequency, hematuria and urgency.  Musculoskeletal: Negative for back pain, joint pain, myalgias  and neck pain.  Skin: Negative for itching and rash.  Neurological: Negative for dizziness, tingling, tremors, sensory change, speech change, focal weakness, seizures, loss of consciousness, weakness and headaches.  Psychiatric/Behavioral: Negative for memory loss. The patient does not have insomnia.    MEDICATIONS AT HOME:   Prior to Admission medications     Medication Sig Start Date End Date Taking? Authorizing Provider  albuterol (PROVENTIL HFA;VENTOLIN HFA) 108 (90 Base) MCG/ACT inhaler Inhale 2 puffs into the lungs every 6 (six) hours as needed for wheezing or shortness of breath.    [provider]  azithromycin (ZITHROMAX Z-PAK) 250 MG tablet Take 2 tablets (500 mg) on  Day 1,  followed by 1 tablet (250 mg) once daily on Days 2 through 5. 03/14/18 03/19/18  Darel Hong, MD  cyanocobalamin (,VITAMIN B-12,) 1000 MCG/ML injection Inject 1,000 mcg into the skin every 30 (thirty) days.     [provider]  feeding supplement, ENSURE ENLIVE, (ENSURE ENLIVE) LIQD Take 237 mLs by mouth 2 (two) times daily between meals. 03/18/16   [name], MD  Fluticasone-Salmeterol (ADVAIR) 500-50 MCG/DOSE AEPB Inhale 1 puff into the lungs 2 (two) times daily.    [provider]  ipratropium-albuterol (DUONEB) 0.5-2.5 (3) MG/3ML SOLN Take 3 mLs by nebulization every 6 (six) hours as needed.    [provider]  lisinopril (PRINIVIL,ZESTRIL) 5 MG tablet Take 1 tablet (5 mg total) by mouth daily. 02/23/18 04/24/18  Henreitta Leber, MD  metoprolol tartrate (LOPRESSOR) 25 MG tablet Take 1 tablet (25 mg total) by mouth 2 (two) times daily. 08/20/17   Gladstone Lighter, MD  predniSONE (DELTASONE) 50 MG tablet Take 1 tablet (50 mg total) by mouth daily for 4 days. 03/14/18 03/18/18  Darel Hong, MD  tiotropium (SPIRIVA HANDIHALER) 18 MCG inhalation capsule Place 1 capsule (18 mcg total) into inhaler and inhale daily. 02/23/18 04/24/18  Henreitta Leber, MD  traMADol (ULTRAM) 50 MG tablet Take 50 mg by mouth every 6 (six) hours as needed for moderate pain or severe pain. Reported on 03/14/2016    [provider]      VITAL SIGNS:  Blood pressure (!) 165/84, pulse 91, temperature 98.6 F (37 C), temperature source Oral, resp. rate (!) 24, height 5\' 4"  (1.626 m), weight 52.2 kg (115 lb), SpO2 93 %.  PHYSICAL EXAMINATION:  Physical  Exam  Constitutional: She is oriented to person, place, and time. She appears well-developed and well-nourished. She is active and cooperative.  Non-toxic appearance. She does not have a sickly appearance. She does not appear ill. She appears distressed (mild respiratory distress). She is not intubated. Nasal cannula in place.  HENT:  Head: Normocephalic and atraumatic.  Mouth/Throat: Oropharynx is clear and moist. No oropharyngeal exudate.  Eyes: Conjunctivae, EOM and lids are normal. No scleral icterus.  Neck: Neck supple. No JVD present. No thyromegaly present.  Cardiovascular: Normal rate, regular rhythm, S1 normal, S2 normal and normal heart sounds.  No extrasystoles are present. Exam reveals no gallop, no S3, no S4, no distant heart sounds and no friction rub.  No murmur heard. Pulmonary/Chest: No accessory muscle usage or stridor. Tachypnea noted. No apnea and no bradypnea. She is not intubated. She is in respiratory distress (mild respiratory distress). She has decreased breath sounds in the right upper field, the right middle field, the right lower field, the left upper field, the left middle field and the left lower field. She has wheezes in the right upper field, the right  middle field, the right lower field, the left upper field, the left middle field and the left lower field. She has no rhonchi. She has no rales.  Abdominal: Soft. Bowel sounds are normal. She exhibits no distension. There is no tenderness. There is no rebound and no guarding.  Musculoskeletal: Normal range of motion. She exhibits no edema or tenderness.  Lymphadenopathy:    She has no cervical adenopathy.  Neurological: She is alert and oriented to person, place, and time. She is not disoriented.  Skin: Skin is warm, dry and intact. No rash noted. She is not diaphoretic. No erythema.  Psychiatric: She has a normal mood and affect. Her speech is normal and behavior is normal. Judgment and thought content normal. Cognition  and memory are normal.   (+) tachypnea, labored breathing; (+) diffusely diminished BS w/ diffuse high-pitched wheezing B/L, (+) conversational dyspnea. (-) tripoding, retraction, accessory muscle use. Non-tachycardic at the time of my exam. LABORATORY PANEL:   CBC Recent Labs  Lab 03/13/18 2159  WBC 5.3  HGB 14.3  HCT 40.7  PLT 190   ------------------------------------------------------------------------------------------------------------------  Chemistries  Recent Labs  Lab 03/13/18 2159  NA 139  K 3.7  CL 102  CO2 28  GLUCOSE 111*  BUN 14  CREATININE 0.61  CALCIUM 9.0  AST 18  ALT 11  ALKPHOS 34*  BILITOT 0.6   ------------------------------------------------------------------------------------------------------------------  Cardiac Enzymes Recent Labs  Lab 03/13/18 2159  TROPONINI <0.03   ------------------------------------------------------------------------------------------------------------------  RADIOLOGY:  Dg Chest Port 1 View  Result Date: 03/13/2018 CLINICAL DATA:  Respiratory distress EXAM: PORTABLE CHEST 1 VIEW COMPARISON:  02/21/2018, 08/20/2017 FINDINGS: Hyperinflation with coarse chronic interstitial opacity. No acute consolidation or pleural effusion. Stable cardiomediastinal silhouette with aortic atherosclerosis. No pneumothorax. IMPRESSION: No active disease. Hyperinflation with stable coarse chronic interstitial opacity. Electronically Signed   By: Donavan Foil M.D.   On: 03/13/2018 22:20   IMPRESSION AND PLAN:   A/P: 61F acute on chronic hypoxemic respiratory failure/acute COPD exacerbation. Also w/ hyperglycemia, BP elevation/HTN. -CXR (-) active cardipulmonary disease -Continuous pulse ox -O2 -DuoNebs -Steroids (IV, transition to PO) -Incentive spirometry -Pulmonary toileting -Denies change in sputum quantity or quality/character, not on BiPAP; mild exacerbation, ABx not indicated -Dulera (formulary sub for home  Advair) -Hyperglycemia likely reactive + 2/2 steroids -BP elevation likely 2/2 distress; c/w home meds, monitor -c/w home meds -Cardiac diet -Lovenox -Full code -Observation, < 2 midnights   All the records are reviewed and case discussed with ED provider. Management plans discussed with the patient, family and they are in agreement.  CODE STATUS: Full code  TOTAL TIME TAKING CARE OF THIS PATIENT: 60 minutes.    Arta Silence M.D on 03/14/2018 at 1:25 AM  Between 7am to 6pm - Pager - [phone]  After 6pm go to [email or web address] - Proofreader  [facility]  Office  [phone]  CC: Primary care physician; Baxter Hire, MD   Note: This dictation was prepared with Dragon dictation along with smaller phrase technology. Any transcriptional errors that result from this process are unintentional.

## 2018-03-14 NOTE — Progress Notes (Signed)
Spoke with Dr. Manuella Ghazi about patient complaining of her "leg jumping."  He is reluctant to start her on any new meds because of her age and without prior diagnosis. This RN agrees and will educate patient.

## 2018-03-14 NOTE — Progress Notes (Signed)
Advanced Care Plan.  Purpose of Encounter: CODE STATUS. Parties in Attendance: The patient and me. Patient's Decisional Capacity: Yes. Medical Story: A/P: Turkessa Ostrom  is a 84 y.o. female with a known history of COPD who p/w SOB/respiratory distress, acute hypoxemic respiratory failure/acute COPD exacerbation.  I discussed with the patient about her current condition, prognosis and CODE STATUS.  The patient wants full code since her son wants.  But she does not want to put on ventilation for long time or vegetation status. Plan:  Code Status: Full code for now. Time spent discussing advance care planning: 16-17 minutes.

## 2018-03-14 NOTE — ED Notes (Signed)
ED Provider at bedside. 

## 2018-03-14 NOTE — Therapy (Signed)
Pt with inspiratory and expiratory wheezes complaining of SOB.  Saturations of 97%.  SVN given with Xopenex 1.25mg .  Patient still wheezing and SOB post treatment.  RN asked for an ABG, but when I went into the room to reassess the patient, she was feeling better.  Working with IS with volumes 1250-1500 ml.  Patient feeling better and asked to wait on drawing ABG.  I checked on the patient at 3:30p and she is feeling better.   Routine treatment is due at 4p.  ABG canceled.

## 2018-03-14 NOTE — ED Provider Notes (Signed)
After 4 breathing treatments the patient is still quite short of breath and unable to walk on her own.  Her saturations are okay however she does not normally use oxygen at home.  I will give her 2 more breathing treatments and she will require inpatient admission.   Darel Hong, MD 03/14/18 [phone]

## 2018-03-14 NOTE — ED Notes (Signed)
Report called to brittany rn floor nurse 

## 2018-03-14 NOTE — ED Notes (Signed)
Transported to rm [id number]

## 2018-03-14 NOTE — Care Management Obs Status (Signed)
[facility] NOTIFICATION   Patient Details  Name: Janice Hoffman MRN: [id number] Date of Birth: [birth year]   Medicare Observation Status Notification Given:  Yes    Dashton Czerwinski A Kairon Shock, RN 03/14/2018, 3:16 PM

## 2018-03-14 NOTE — Care Management Note (Signed)
Case Management Note  Patient Details  Name: Janice Hoffman MRN: [id number] Date of Birth: [birth year]  Subjective/Objective:    Patient admitted to [facility] under observation status for acute exacerbation of COPD. Patient lives with her husband and son Earlena Werst [phone]. Patient tells me she is able to complete all activities of daily living using only a cane at times for assistance. Still drives although son typically provides transport. Patient thinks she is currently open with [facility] since she was discharged with their services last hospitalization. PCP is Edwina Barth with [facility]. Utilizes [facility] on [address] and obtains medications without issue.                  Action/Plan:  RNCM to continue to follow for any needs. Will touch base with advanced home care to ensure she is still active. Expected Discharge Date:                  Expected Discharge Plan:     In-House Referral:     Discharge planning Services     Post Acute Care Choice:    Choice offered to:     DME Arranged:    DME Agency:     HH Arranged:    HH Agency:     Status of Service:     If discussed at [employer], dates discussed:    Additional Comments:  Latanya Maudlin, RN 03/14/2018, 8:49 AM

## 2018-03-15 ENCOUNTER — Telehealth: Payer: Self-pay | Admitting: Medical Oncology

## 2018-03-15 MED ORDER — PREDNISONE 20 MG PO TABS: 40.0000 mg | ORAL_TABLET | Freq: Every day | ORAL | 0 refills | Status: DC

## 2018-03-15 MED ORDER — BENZONATATE 100 MG PO CAPS: 200.0000 mg | ORAL_CAPSULE | Freq: Three times a day (TID) | ORAL | 0 refills | Status: DC | PRN

## 2018-03-15 NOTE — Discharge Instructions (Signed)
Home health PT;Supervision/Assistance - 24 hour

## 2018-03-15 NOTE — Progress Notes (Signed)
Patient became short of breath upon exertion after getting dressed and walking to the bathroom before discharge. Gave patient a breathing treatment. Patient stated that she feels better post breathing treatment. Lung sounds wheeze bilaterally but much improved since treatment. Does not appear to be in distress at this time. Stated that she wants to go home. Wheeled patient out for discharged let son [name] know of previous events. He asked patient if she felt better and if she felt that she could go home. Patient stated that she wanted to go home. Patients son agrees. Assisted patient into car. All discharge instructions given and iv removed and intact. Educated patients son of the importance of someone being with her for safety. Son agreed.

## 2018-03-15 NOTE — Care Management Note (Signed)
Case Management Note  Patient Details  Name: Janice Hoffman MRN: [id number] Date of Birth: 1926-02-29  Subjective/Objective:   Patient to be discharged per MD order. Orders in place for resumption of home health care. PT,Rn and aide ordered. Notified Jermaine of referral and services needed. Confirmed with son [name] [phone] the discharge planning and he was in agreement. [name] will provide transport home today.                  Action/Plan:   Expected Discharge Date:  03/15/18               Expected Discharge Plan:     In-House Referral:     Discharge planning Services  CM Consult  Post Acute Care Choice:  Resumption of Svcs/PTA Provider, Home Health Choice offered to:  Patient, Adult Children  DME Arranged:    DME Agency:     HH Arranged:  RN, PT, Nurse's Aide [place] Agency:  [facility]  Status of Service:  Completed, signed off  If discussed at [place] of Stay Meetings, dates discussed:    Additional Comments:  Latanya Maudlin, RN 03/15/2018, 11:35 AM

## 2018-03-15 NOTE — Discharge Summary (Signed)
[facility] NAME: Janice Hoffman    MR#:  [id number]  DATE OF BIRTH:  December 12, 1925  DATE OF ADMISSION:  03/13/2018   ADMITTING PHYSICIAN: Arta Silence, MD  DATE OF DISCHARGE: 03/15/2018 PRIMARY CARE PHYSICIAN: Baxter Hire, MD   ADMISSION DIAGNOSIS:  COPD exacerbation ([place]) [J44.1] DISCHARGE DIAGNOSIS:  Active Problems:   Acute exacerbation of chronic obstructive pulmonary disease (COPD) ([place])  SECONDARY DIAGNOSIS:   Past Medical History:  Diagnosis Date  . Asthma   . COPD (chronic obstructive pulmonary disease) ([place])   . Herpes zoster   . Hypertension   . Neuralgia    HOSPITAL COURSE:   A/P: 36F acute on chronic hypoxemic respiratory failure/acute COPD exacerbation. She has been treated with duoNebs, steroids (IV, transition to PO) and Incentive spirometry. Her symptoms has much improved. Off O2 [place]. Weakness: HHPT. DISCHARGE CONDITIONS:  Stable, discharge to home with HHPT today. CONSULTS OBTAINED:  Treatment Team:  Arta Silence, MD DRUG ALLERGIES:   Allergies  Allergen Reactions  . Aspirin Shortness Of Breath  . Nsaids Shortness Of Breath    Throat swelling, shortness of breath  . Sulfa Antibiotics Other (See Comments)    Stomach pain Stomach pain   . Codeine Other (See Comments)    Short of breath   . Ciprofloxacin Hives  . Penicillins     Has patient had a PCN reaction causing immediate rash, facial/tongue/throat swelling, SOB or lightheadedness with hypotension: Unknown Has patient had a PCN reaction causing severe rash involving mucus membranes or skin necrosis: Unknown Has patient had a PCN reaction that required hospitalization: Unknown Has patient had a PCN reaction occurring within the last 10 years: Unknown If all of the above answers are "NO", then may proceed with Cephalosporin use.   . Promethazine Hcl    DISCHARGE MEDICATIONS:   Allergies as of 03/15/2018      Reactions   Aspirin Shortness Of Breath   Nsaids Shortness Of Breath   Throat swelling, shortness of breath   Sulfa Antibiotics Other (See Comments)   Stomach pain Stomach pain   Codeine Other (See Comments)   Short of breath   Ciprofloxacin Hives   Penicillins    Has patient had a PCN reaction causing immediate rash, facial/tongue/throat swelling, SOB or lightheadedness with hypotension: Unknown Has patient had a PCN reaction causing severe rash involving mucus membranes or skin necrosis: Unknown Has patient had a PCN reaction that required hospitalization: Unknown Has patient had a PCN reaction occurring within the last 10 years: Unknown If all of the above answers are "NO", then may proceed with Cephalosporin use.   Promethazine Hcl       Medication List    TAKE these medications   albuterol 108 (90 Base) MCG/ACT inhaler Commonly known as:  PROVENTIL HFA;VENTOLIN HFA Inhale 2 puffs into the lungs every 6 (six) hours as needed for wheezing or shortness of breath.   benzonatate 100 MG capsule Commonly known as:  TESSALON Take 2 capsules (200 mg total) by mouth 3 (three) times daily as needed for cough.   cyanocobalamin 1000 MCG/ML injection Commonly known as:  (VITAMIN B-12) Inject 1,000 mcg into the skin every 30 (thirty) days.   feeding supplement (ENSURE ENLIVE) Liqd Take 237 mLs by mouth 2 (two) times daily between meals.   Fluticasone-Salmeterol 500-50 MCG/DOSE Aepb Commonly known as:  ADVAIR Inhale 1 puff into the lungs 2 (two) times daily.   ipratropium-albuterol 0.5-2.5 (3) MG/3ML  Soln Commonly known as:  DUONEB Take 3 mLs by nebulization every 6 (six) hours as needed.   lisinopril 5 MG tablet Commonly known as:  PRINIVIL,ZESTRIL Take 1 tablet (5 mg total) by mouth daily.   metoprolol tartrate 25 MG tablet Commonly known as:  LOPRESSOR Take 1 tablet (25 mg total) by mouth 2 (two) times daily.   predniSONE 20 MG tablet Commonly known as:  DELTASONE Take 2 tablets (40  mg total) by mouth daily with breakfast. What changed:    medication strength  how much to take  how to take this  when to take this  additional instructions   tiotropium 18 MCG inhalation capsule Commonly known as:  SPIRIVA HANDIHALER Place 1 capsule (18 mcg total) into inhaler and inhale daily.   traMADol 50 MG tablet Commonly known as:  ULTRAM Take 50 mg by mouth every 6 (six) hours as needed for moderate pain or severe pain. Reported on 03/14/2016        DISCHARGE INSTRUCTIONS:  See AVS.  If you experience worsening of your admission symptoms, develop shortness of breath, life threatening emergency, suicidal or homicidal thoughts you must seek medical attention immediately by calling 911 or calling your MD immediately  if symptoms less severe.  You Must read complete instructions/literature along with all the possible adverse reactions/side effects for all the Medicines you take and that have been prescribed to you. Take any new Medicines after you have completely understood and accpet all the possible adverse reactions/side effects.   Please note  You were cared for by a hospitalist during your hospital stay. If you have any questions about your discharge medications or the care you received while you were in the hospital after you are discharged, you can call the unit and asked to speak with the hospitalist on call if the hospitalist that took care of you is not available. Once you are discharged, your primary care physician will handle any further medical issues. Please note that NO REFILLS for any discharge medications will be authorized once you are discharged, as it is imperative that you return to your primary care physician (or establish a relationship with a primary care physician if you do not have one) for your aftercare needs so that they can reassess your need for medications and monitor your lab values.    On the day of Discharge:  VITAL SIGNS:  Blood pressure  (!) 158/60, pulse 77, temperature 98.3 F (36.8 C), temperature source Oral, resp. rate 18, height 5\' 4"  (1.626 m), weight 110 lb 8 oz (50.1 kg), SpO2 99 %. PHYSICAL EXAMINATION:  GENERAL:  84 y.o.-year-old patient lying in the bed with no acute distress.  EYES: Pupils equal, round, reactive to light and accommodation. No scleral icterus. Extraocular muscles intact.  HEENT: Head atraumatic, normocephalic. Oropharynx and nasopharynx clear.  NECK:  Supple, no jugular venous distention. No thyroid enlargement, no tenderness.  LUNGS: Normal breath sounds bilaterally, no wheezing, rales,rhonchi or crepitation. No use of accessory muscles of respiration.  CARDIOVASCULAR: S1, S2 normal. No murmurs, rubs, or gallops.  ABDOMEN: Soft, non-tender, non-distended. Bowel sounds present. No organomegaly or mass.  EXTREMITIES: No pedal edema, cyanosis, or clubbing.  NEUROLOGIC: Cranial nerves II through XII are intact. Muscle strength 4/5 in all extremities. Sensation intact. Gait not checked.  PSYCHIATRIC: The patient is alert and oriented x 3.  SKIN: No obvious rash, lesion, or ulcer.  DATA REVIEW:   CBC Recent Labs  Lab 03/13/18 2159  WBC 5.3  HGB 14.3  HCT 40.7  PLT 190    Chemistries  Recent Labs  Lab 03/13/18 2159  NA 139  K 3.7  CL 102  CO2 28  GLUCOSE 111*  BUN 14  CREATININE 0.61  CALCIUM 9.0  AST 18  ALT 11  ALKPHOS 34*  BILITOT 0.6     Microbiology Results  Results for orders placed or performed during the hospital encounter of 08/18/17  Blood culture (routine x 2)     Status: None   Collection Time: 08/18/17  7:55 PM  Result Value Ref Range Status   Specimen Description BLOOD LEFT AC  Final   Special Requests   Final    BOTTLES DRAWN AEROBIC AND ANAEROBIC Blood Culture results may not be optimal due to an excessive volume of blood received in culture bottles   Culture NO GROWTH 5 DAYS  Final   Report Status 08/23/2017 FINAL  Final  Blood culture (routine x 2)      Status: None   Collection Time: 08/18/17  7:55 PM  Result Value Ref Range Status   Specimen Description BLOOD RIGHT AC  Final   Special Requests   Final    BOTTLES DRAWN AEROBIC AND ANAEROBIC Blood Culture results may not be optimal due to an excessive volume of blood received in culture bottles   Culture NO GROWTH 5 DAYS  Final   Report Status 08/23/2017 FINAL  Final    RADIOLOGY:  No results found.   Management plans discussed with the patient, her son and they are in agreement.  CODE STATUS: Full Code   TOTAL TIME TAKING CARE OF THIS PATIENT: 36 minutes.    Demetrios Loll M.D on 03/15/2018 at 11:34 AM  Between 7am to 6pm - Pager - [phone]  After 6pm go to [email or web address] - Proofreader  [facility]  Office  [phone]  CC: Primary care physician; Baxter Hire, MD   Note: This dictation was prepared with Dragon dictation along with smaller phrase technology. Any transcriptional errors that result from this process are unintentional.

## 2018-03-15 NOTE — Evaluation (Signed)
Physical Therapy Evaluation Patient Details Name: Janice Hoffman MRN: [id number] DOB: February 12, 1926 Today's Date: 03/15/2018   History of Present Illness  pt presents to ED via EMS on 03/13/18 with respiratory distress. Pt diagnosed with an acute COPD exaccerbation. Pt frequently admitted most recently 02/22/18. Pt has a past medical history that includes asthma, COPD, HTN, and neuralgia.    Clinical Impression  Pt is a pleasant 84 year old female who was admitted for an acute COPD exaccerbation. Pt performs bed mobility, transfers, and ambulation with modified independence to CGA. Pt demonstrates deficits with strength, endurance, and mobility. Pt amb 50' with a RW, CGA, and a chair follow. Pt fatigues easily noted through increasing unsteadiness with gait and increased knee buckling. Pt demonstrates poor self awareness stating that she is not fatigued even though gait is worsening. Pt is very confused at this time and is poor historian. Pt states that she lives with her husband who has been dead for 9 years. RN states that pt is living with son who is very hands on in pts care. Pt states that she is weak since this admission. Pts vitals monitored throughout session, HR and O2 saturation remained WNL during session. Pt remained asymptomatic throughout session. Pt could benefit from continued skilled therapy at this time to improve deficits toward PLOF. PT will continue to work with pt 2x/week while admitted. D/c recommendations at this time are home with home health PT and 24/7 supervision due to pts impaired cognition. This entire session was guided, instructed, and directly supervised by Greggory Stallion, DPT.       Follow Up Recommendations Home health PT;Supervision/Assistance - 24 hour    Equipment Recommendations  None recommended by PT    Recommendations for Other Services       Precautions / Restrictions Precautions Precautions: None Restrictions Weight Bearing Restrictions: No       Mobility  Bed Mobility Overal bed mobility: Modified Independent             General bed mobility comments: Requires increased time and UE support to complete safely  Transfers Overall transfer level: Modified independent Equipment used: Rolling walker (2 wheeled)             General transfer comment: pt independently transfers sit<>stand. Requires multiple attempts to perform including verbal cuing for UE placement  Ambulation/Gait Ambulation/Gait assistance: Min guard Gait Distance (Feet): 50 Feet Assistive device: Rolling walker (2 wheeled) Gait Pattern/deviations: Step-through pattern;Trunk flexed(slight knee flexion throughout, occasional L knee buckling)     General Gait Details: Pt amb 50' with CGA, RW, and a chair follow. Pt ambulates with slow step through gait, trunk flexion, and slight knee flexion bilaterally. pt had no gross LOB however displays poor awareness of fatigue stating that she was not tired even as gait worsened and L knee began to buckle more consistently. Pt states that she is a little weak since this admission. Pt safely performed pre gait marching exercises prior to beginning.  Stairs            Wheelchair Mobility    Modified Rankin (Stroke Patients Only)       Balance Overall balance assessment: Needs assistance   Sitting balance-Leahy Scale: Good Sitting balance - Comments: Pt sits EOB without extremity support and no gross LOB     Standing balance-Leahy Scale: Fair Standing balance comment: Pt requires B UE support on RW to maintain upright posture no gross LOB during standing, pre gait, or gait activities  Pertinent Vitals/Pain Pain Assessment: No/denies pain    Home Living Family/patient expects to be discharged to:: Private residence Living Arrangements: Children Available Help at Discharge: Family Type of Home: House Home Access: Stairs to enter Entrance Stairs-Rails:  Chemical engineer of Steps: 5 Home Layout: One level Home Equipment: Environmental consultant - 2 wheels;Wheelchair - manual;Cane - single point Additional Comments: pt unable to give clear history originally stating that she lives with her husband who she later states has been dead for 9 years. RN states that pt is currently living with son. Information for living arrangements received from medical record due to pts inability to provide.    Prior Function Level of Independence: Independent with assistive device(s)         Comments: Pt states that she is independent however due to pt being poor historian information received from medical record that she is independent with SPC and RW. Pt states that she has not had any falls, unsure of accuracy     Hand Dominance        Extremity/Trunk Assessment   Upper Extremity Assessment Upper Extremity Assessment: Overall WFL for tasks assessed    Lower Extremity Assessment Lower Extremity Assessment: Overall WFL for tasks assessed(Grossly 4/5 B including hip flexion, knee flex/ext, DF)       Communication   Communication: HOH  Cognition Arousal/Alertness: Awake/alert Behavior During Therapy: WFL for tasks assessed/performed Overall Cognitive Status: Impaired/Different from baseline(Unsure of baseline, no history in medical record) Area of Impairment: Orientation                 Orientation Level: Disoriented to;Time             General Comments: pt disoriented and provides variable information throughout session. Pt originally states that she lives with her husband who she later states has been dead for 9 years. Pt ordered breakfast at beginning of session however later inquires about if she needs to order breakfast.      General Comments      Exercises Other Exercises Other Exercises: Pt instructed in B LE strengthening exercises including SLR, hip abd, hip add isometrics x10. pt tolerated ther-ex well.    Assessment/Plan    PT Assessment Patient needs continued PT services  PT Problem List Decreased strength;Decreased range of motion;Decreased activity tolerance;Decreased balance;Decreased mobility;Decreased coordination;Decreased cognition;Decreased knowledge of use of DME;Decreased safety awareness       PT Treatment Interventions DME instruction;Gait training;Stair training;Functional mobility training;Therapeutic activities;Therapeutic exercise;Balance training;Neuromuscular re-education;Cognitive remediation;Patient/family education    PT Goals (Current goals can be found in the Care Plan section)  Acute Rehab PT Goals Patient Stated Goal: pt states that she wants to go home PT Goal Formulation: With patient Time For Goal Achievement: 03/29/18 Potential to Achieve Goals: Fair    Frequency Min 2X/week   Barriers to discharge        Co-evaluation               AM-PAC PT "6 Clicks" Daily Activity  Outcome Measure Difficulty turning over in bed (including adjusting bedclothes, sheets and blankets)?: A Little Difficulty moving from lying on back to sitting on the side of the bed? : A Little Difficulty sitting down on and standing up from a chair with arms (e.g., wheelchair, bedside commode, etc,.)?: A Little Help needed moving to and from a bed to chair (including a wheelchair)?: A Little Help needed walking in hospital room?: A Little Help needed climbing 3-5 steps with a railing? : A Lot 6  Click Score: 17    End of Session Equipment Utilized During Treatment: Gait belt Activity Tolerance: Patient tolerated treatment well Patient left: in chair;with chair alarm set;with call bell/phone within reach Nurse Communication: Mobility status;Other (comment)(d/c recommendations) PT Visit Diagnosis: Unsteadiness on feet (R26.81);Other abnormalities of gait and mobility (R26.89);Muscle weakness (generalized) (M62.81);Difficulty in walking, not elsewhere classified (R26.2)     Time: 9093-1121 PT Time Calculation (min) (ACUTE ONLY): 26 min   Charges:         PT G Codes:        Jaymon Dudek, SPT   Corion Sherrod 03/15/2018, 8:36 AM

## 2018-04-09 ENCOUNTER — Encounter: Payer: Self-pay | Admitting: Internal Medicine

## 2018-04-09 ENCOUNTER — Ambulatory Visit: Payer: Medicare Other | Admitting: Internal Medicine

## 2018-04-09 VITALS — BP 116/80 | HR 58 | Resp 16 | Ht 64.0 in | Wt 120.0 lb

## 2018-04-09 DIAGNOSIS — R918 Other nonspecific abnormal finding of lung field: Secondary | ICD-10-CM

## 2018-04-09 DIAGNOSIS — J449 Chronic obstructive pulmonary disease, unspecified: Secondary | ICD-10-CM

## 2018-04-09 NOTE — Patient Instructions (Addendum)
Continue SPIRIVA AS PRESCRIBED CONTINUE ADVAIR AS PRESCRIBED  ALBUTEROL(PROAIR) use as needed  CT chest without contrast to assess RUL nodule

## 2018-04-09 NOTE — Progress Notes (Signed)
Name: MASA LUBIN MRN: [id number] DOB: May 07, 1926     CONSULTATION DATE: 8.1.19 REFERRING MD : chen  CHIEF COMPLAINT: SOB, COPD  STUDIES:   08/18/17   CT chest Independently reviewed by Me B/l opacities L>R RUL nodule approx 7 MM  HISTORY OF PRESENT ILLNESS: 84F with recent admission to the hospital for acute respiratory failure/acute COPD exacerbation. She has been treated with duoNebs, steroids and antibiotics and Incentive spirometry, and oxygen therapy 1 week prior to hospitalization patient had increased work of breathing severe shortness of breath with wheezing   She was discharged from the hospital on July 7 She was discharged on Advair Spiriva and prednisone therapy  Patient has had multiple admissions in the last several years for her shortness of breath and COPD exacerbation It seems that she is stable at this time as she was discharged on Advair and Spiriva  Patient is thin and frail She moved in to her son's home last several years Patient uses a walker most of the time She seems to be exercising on a daily basis  No acute respiratory distress at this time No signs of active infection at this time  Overall her set respiratory symptoms seem to be improved and controlled with Advair and Spiriva  I have reviewed the chest x-ray and CT chest with the patient and the family Patient it seems that patient has been diagnosed with COPD several years ago She is a non-smoker She worked in a Special educational needs teacher for approximately 15 years     PAST MEDICAL HISTORY :   has a past medical history of Asthma, COPD (chronic obstructive pulmonary disease) ([place]), Herpes zoster, Hypertension, and Neuralgia.  has a past surgical history that includes Leg Surgery. Prior to Admission medications   Medication Sig Start Date End Date Taking? Authorizing Provider  albuterol (PROVENTIL HFA;VENTOLIN HFA) 108 (90 Base) MCG/ACT inhaler Inhale 2 puffs into the lungs every 6 (six) hours as  needed for wheezing or shortness of breath.    [provider]  benzonatate (TESSALON) 100 MG capsule Take 2 capsules (200 mg total) by mouth 3 (three) times daily as needed for cough. 03/15/18   Demetrios Loll, MD  cyanocobalamin (,VITAMIN B-12,) 1000 MCG/ML injection Inject 1,000 mcg into the skin every 30 (thirty) days.     [provider]  feeding supplement, ENSURE ENLIVE, (ENSURE ENLIVE) LIQD Take 237 mLs by mouth 2 (two) times daily between meals. 03/18/16   [name], MD  Fluticasone-Salmeterol (ADVAIR) 500-50 MCG/DOSE AEPB Inhale 1 puff into the lungs 2 (two) times daily.    [provider]  ipratropium-albuterol (DUONEB) 0.5-2.5 (3) MG/3ML SOLN Take 3 mLs by nebulization every 6 (six) hours as needed.    [provider]  lisinopril (PRINIVIL,ZESTRIL) 5 MG tablet Take 1 tablet (5 mg total) by mouth daily. 02/23/18 04/24/18  Henreitta Leber, MD  metoprolol tartrate (LOPRESSOR) 25 MG tablet Take 1 tablet (25 mg total) by mouth 2 (two) times daily. 08/20/17   Gladstone Lighter, MD  predniSONE (DELTASONE) 20 MG tablet Take 2 tablets (40 mg total) by mouth daily with breakfast. 03/15/18   Demetrios Loll, MD  tiotropium (SPIRIVA HANDIHALER) 18 MCG inhalation capsule Place 1 capsule (18 mcg total) into inhaler and inhale daily. 02/23/18 04/24/18  Henreitta Leber, MD  traMADol (ULTRAM) 50 MG tablet Take 50 mg by mouth every 6 (six) hours as needed for moderate pain or severe pain. Reported on 03/14/2016    [provider]  Allergies  Allergen Reactions  . Aspirin Shortness Of Breath  . Nsaids Shortness Of Breath and Anaphylaxis    Throat swelling, shortness of breath  . Sulfa Antibiotics Other (See Comments)    Stomach pain Stomach pain   . Codeine Other (See Comments)    Short of breath   . Ciprofloxacin Hives  . Penicillins     Has patient had a PCN reaction causing immediate rash, facial/tongue/throat swelling, SOB or lightheadedness with hypotension:  Unknown Has patient had a PCN reaction causing severe rash involving mucus membranes or skin necrosis: Unknown Has patient had a PCN reaction that required hospitalization: Unknown Has patient had a PCN reaction occurring within the last 10 years: Unknown If all of the above answers are "NO", then may proceed with Cephalosporin use.   Marland Kitchen Phenol     Other reaction(s): Angioedema (ALLERGY/intolerance), Other (See Comments) Spasms, nerve symptoms Spasms, nerve symptoms   . Promethazine Hcl     FAMILY HISTORY:  family history includes COPD in her sister. SOCIAL HISTORY:  reports that she has never smoked. She has never used smokeless tobacco. She reports that she does not drink alcohol or use drugs.  REVIEW OF SYSTEMS:   Constitutional: Negative for fever, chills, weight loss, malaise/fatigue and diaphoresis.  HENT: Negative for hearing loss, ear pain, nosebleeds, congestion, sore throat, neck pain, tinnitus and ear discharge.   Eyes: Negative for blurred vision, double vision, photophobia, pain, discharge and redness.  Respiratory: Negative for cough, hemoptysis, sputum production, shortness of breath, wheezing and stridor.   Cardiovascular: Negative for chest pain, palpitations, orthopnea, claudication, leg swelling and PND.  Gastrointestinal: Negative for heartburn, nausea, vomiting, abdominal pain, diarrhea, constipation, blood in stool and melena.  Genitourinary: Negative for dysuria, urgency, frequency, hematuria and flank pain.  Musculoskeletal: Negative for myalgias, back pain, joint pain and falls.  Skin: Negative for itching and rash.  Neurological: Negative for dizziness, tingling, tremors, sensory change, speech change, focal weakness, seizures, loss of consciousness, weakness and headaches.  Endo/Heme/Allergies: Negative for environmental allergies and polydipsia. Does not bruise/bleed easily.  ALL OTHER ROS ARE NEGATIVE  BP 116/80 (BP Location: Left Arm, Cuff Size: Normal)    Pulse (!) 58   Resp 16   Ht 5\' 4"  (1.626 m)   Wt 120 lb (54.4 kg)   SpO2 97%   BMI 20.60 kg/m   Physical Examination:   GENERAL:NAD, no fevers, chills, no weakness no fatigue HEAD: Normocephalic, atraumatic.  EYES: Pupils equal, round, reactive to light. Extraocular muscles intact. No scleral icterus.  MOUTH: Moist mucosal membrane.   EAR, NOSE, THROAT: Clear without exudates. No external lesions.  NECK: Supple. No thyromegaly. No nodules. No JVD.  PULMONARY:CTA B/L no wheezes, no crackles, no rhonchi CARDIOVASCULAR: S1 and S2. Regular rate and rhythm. No murmurs, rubs, or gallops. No edema.  GASTROINTESTINAL: Soft, nontender, nondistended. No masses. Positive bowel sounds.  MUSCULOSKELETAL: No swelling, clubbing, or edema. Range of motion full in all extremities.  NEUROLOGIC: Cranial nerves II through XII are intact. No gross focal neurological deficits.  SKIN: No ulceration, lesions, rashes, or cyanosis. Skin warm and dry. Turgor intact.  PSYCHIATRIC: Mood, affect within normal limits. The patient is awake, alert and oriented x 3. Insight, judgment intact.      ASSESSMENT / PLAN: 84 year old pleasant white female seen today for assessment of her underlying COPD with a recent admission for acute COPD exacerbation in the setting of a right upper lobe nodule found on CAT scan of her chest  At this time her COPD seems to be stable with the current regimen of Advair and Spiriva combination therapy She is not in any distress at this time and does not need any steroids at this time I do not think she needs any other therapy at this time, we will continue to use her inhalers as prescribed  I believe PFTs will not change her management at this time I have reviewed the CT of the chest from December 2018 which seems to have a right upper lobe nodular density that needs to be followed up with a repeat CT chest  Patient/Family are satisfied with Plan of action and management. All questions  answered Follow-up in 3 months with repeat CT chest   Yoseph Haile Patricia Pesa, M.D.  [facility]  Medical Director [place] Director [facility] Cardio-Pulmonary Department

## 2018-04-15 ENCOUNTER — Ambulatory Visit: Payer: Medicare Other

## 2018-04-22 ENCOUNTER — Ambulatory Visit
Admission: RE | Admit: 2018-04-22 | Discharge: 2018-04-22 | Disposition: A | Discharge status: Home / Self Care | Payer: Medicare Other | Source: Ambulatory Visit | Attending: Internal Medicine | Admitting: Internal Medicine

## 2018-04-22 DIAGNOSIS — I700 Atherosclerosis of aorta: Secondary | ICD-10-CM | POA: Insufficient documentation

## 2018-04-22 DIAGNOSIS — R918 Other nonspecific abnormal finding of lung field: Secondary | ICD-10-CM | POA: Diagnosis not present

## 2018-04-22 DIAGNOSIS — I2510 Atherosclerotic heart disease of native coronary artery without angina pectoris: Secondary | ICD-10-CM | POA: Diagnosis not present

## 2018-07-18 ENCOUNTER — Emergency Department: Payer: Medicare Other

## 2018-07-18 ENCOUNTER — Other Ambulatory Visit: Payer: Self-pay

## 2018-07-18 ENCOUNTER — Inpatient Hospital Stay
Admission: EM | Admit: 2018-07-18 | Discharge: 2018-07-20 | DRG: 871 | Disposition: A | Discharge status: Home / Self Care | Payer: Medicare Other | Attending: Internal Medicine | Admitting: Internal Medicine

## 2018-07-18 ENCOUNTER — Encounter: Payer: Self-pay | Admitting: Medical Oncology

## 2018-07-18 DIAGNOSIS — Z7951 Long term (current) use of inhaled steroids: Secondary | ICD-10-CM

## 2018-07-18 DIAGNOSIS — Z885 Allergy status to narcotic agent status: Secondary | ICD-10-CM | POA: Diagnosis not present

## 2018-07-18 DIAGNOSIS — Z66 Do not resuscitate: Secondary | ICD-10-CM | POA: Diagnosis present

## 2018-07-18 DIAGNOSIS — G9341 Metabolic encephalopathy: Secondary | ICD-10-CM | POA: Diagnosis present

## 2018-07-18 DIAGNOSIS — J449 Chronic obstructive pulmonary disease, unspecified: Secondary | ICD-10-CM | POA: Diagnosis present

## 2018-07-18 DIAGNOSIS — K5900 Constipation, unspecified: Secondary | ICD-10-CM | POA: Diagnosis present

## 2018-07-18 DIAGNOSIS — Z881 Allergy status to other antibiotic agents status: Secondary | ICD-10-CM | POA: Diagnosis not present

## 2018-07-18 DIAGNOSIS — N390 Urinary tract infection, site not specified: Secondary | ICD-10-CM | POA: Diagnosis present

## 2018-07-18 DIAGNOSIS — E872 Acidosis: Secondary | ICD-10-CM | POA: Diagnosis present

## 2018-07-18 DIAGNOSIS — R4182 Altered mental status, unspecified: Secondary | ICD-10-CM

## 2018-07-18 DIAGNOSIS — D649 Anemia, unspecified: Secondary | ICD-10-CM | POA: Diagnosis present

## 2018-07-18 DIAGNOSIS — R739 Hyperglycemia, unspecified: Secondary | ICD-10-CM | POA: Diagnosis present

## 2018-07-18 DIAGNOSIS — Z882 Allergy status to sulfonamides status: Secondary | ICD-10-CM

## 2018-07-18 DIAGNOSIS — Z886 Allergy status to analgesic agent status: Secondary | ICD-10-CM

## 2018-07-18 DIAGNOSIS — Z888 Allergy status to other drugs, medicaments and biological substances status: Secondary | ICD-10-CM | POA: Diagnosis not present

## 2018-07-18 DIAGNOSIS — I10 Essential (primary) hypertension: Secondary | ICD-10-CM | POA: Diagnosis present

## 2018-07-18 DIAGNOSIS — A419 Sepsis, unspecified organism: Principal | ICD-10-CM | POA: Diagnosis present

## 2018-07-18 DIAGNOSIS — Z23 Encounter for immunization: Secondary | ICD-10-CM | POA: Diagnosis not present

## 2018-07-18 DIAGNOSIS — Z880 Allergy status to penicillin: Secondary | ICD-10-CM | POA: Diagnosis not present

## 2018-07-18 DIAGNOSIS — Z79899 Other long term (current) drug therapy: Secondary | ICD-10-CM

## 2018-07-18 LAB — COMPREHENSIVE METABOLIC PANEL
ALBUMIN: 3.4 g/dL — AB (ref 3.5–5.0)
ALT: 10 U/L (ref 0–44)
AST: 15 U/L (ref 15–41)
Alkaline Phosphatase: 36 U/L — ABNORMAL LOW (ref 38–126)
Anion gap: 9 (ref 5–15)
BUN: 15 mg/dL (ref 8–23)
CALCIUM: 8.7 mg/dL — AB (ref 8.9–10.3)
CO2: 25 mmol/L (ref 22–32)
Chloride: 100 mmol/L (ref 98–111)
Creatinine, Ser: 0.63 mg/dL (ref 0.44–1.00)
GFR calc Af Amer: >60 mL/min (ref >60)
GFR calc non Af Amer: >60 mL/min (ref >60)
GLUCOSE: 164 mg/dL — AB (ref 70–99)
POTASSIUM: 3.8 mmol/L (ref 3.5–5.1)
SODIUM: 134 mmol/L — AB (ref 135–145)
TOTAL PROTEIN: 7 g/dL (ref 6.5–8.1)
Total Bilirubin: 1 mg/dL (ref 0.3–1.2)

## 2018-07-18 LAB — IRON AND TIBC
Iron: 10 ug/dL — ABNORMAL LOW (ref 28–170)
SATURATION RATIOS: 4 % — AB (ref 10.4–31.8)
TIBC: 285 ug/dL (ref 250–450)
UIBC: 275 ug/dL

## 2018-07-18 LAB — CBC
HEMATOCRIT: 30.8 % — AB (ref 36.0–46.0)
Hemoglobin: 9.8 g/dL — ABNORMAL LOW (ref 12.0–15.0)
MCH: 29.9 pg (ref 26.0–34.0)
MCHC: 31.8 g/dL (ref 30.0–36.0)
MCV: 93.9 fL (ref 80.0–100.0)
NRBC: 0 % (ref 0.0–0.2)
PLATELETS: 181 10*3/uL (ref 150–400)
RBC: 3.28 MIL/uL — ABNORMAL LOW (ref 3.87–5.11)
RDW: 14.2 % (ref 11.5–15.5)
WBC: 6.4 10*3/uL (ref 4.0–10.5)

## 2018-07-18 LAB — CBC WITH DIFFERENTIAL/PLATELET
Abs Immature Granulocytes: 0.04 10*3/uL (ref 0.00–0.07)
BASOS PCT: 1 %
Basophils Absolute: 0 10*3/uL (ref 0.0–0.1)
EOS ABS: 0.1 10*3/uL (ref 0.0–0.5)
Eosinophils Relative: 1 %
HCT: 32.4 % — ABNORMAL LOW (ref 36.0–46.0)
Hemoglobin: 10.4 g/dL — ABNORMAL LOW (ref 12.0–15.0)
IMMATURE GRANULOCYTES: 1 %
Lymphocytes Relative: 11 %
Lymphs Abs: 0.8 10*3/uL (ref 0.7–4.0)
MCH: 29.9 pg (ref 26.0–34.0)
MCHC: 32.1 g/dL (ref 30.0–36.0)
MCV: 93.1 fL (ref 80.0–100.0)
MONO ABS: 0.5 10*3/uL (ref 0.1–1.0)
MONOS PCT: 7 %
NEUTROS PCT: 79 %
Neutro Abs: 5.8 10*3/uL (ref 1.7–7.7)
PLATELETS: 218 10*3/uL (ref 150–400)
RBC: 3.48 MIL/uL — AB (ref 3.87–5.11)
RDW: 14.3 % (ref 11.5–15.5)
WBC: 7.3 10*3/uL (ref 4.0–10.5)
nRBC: 0 % (ref 0.0–0.2)

## 2018-07-18 LAB — URINALYSIS, ROUTINE W REFLEX MICROSCOPIC
Bilirubin Urine: NEGATIVE
Glucose, UA: NEGATIVE mg/dL
Ketones, ur: NEGATIVE mg/dL
Nitrite: NEGATIVE
PH: 5 (ref 5.0–8.0)
Protein, ur: 30 mg/dL — AB
SPECIFIC GRAVITY, URINE: 1.016 (ref 1.005–1.030)
WBC, UA: >50 WBC/hpf — ABNORMAL HIGH (ref 0–5)

## 2018-07-18 LAB — LACTIC ACID, PLASMA
LACTIC ACID, VENOUS: 2.2 mmol/L — AB (ref 0.5–1.9)
LACTIC ACID, VENOUS: 1.4 mmol/L (ref 0.5–1.9)

## 2018-07-18 LAB — CREATININE, SERUM
Creatinine, Ser: 0.63 mg/dL (ref 0.44–1.00)
GFR calc non Af Amer: >60 mL/min (ref >60)

## 2018-07-18 LAB — VITAMIN B12: Vitamin B-12: 265 pg/mL (ref 180–914)

## 2018-07-18 LAB — FERRITIN: FERRITIN: 112 ng/mL (ref 11–307)

## 2018-07-18 LAB — FOLATE: Folate: 6.9 ng/mL (ref >5.9)

## 2018-07-18 MED ORDER — SODIUM CHLORIDE 0.9 % IV SOLN
2.0000 g | Freq: Once | INTRAVENOUS | Status: AC
  Administered 2018-07-18: 2 g via INTRAVENOUS
  Filled 2018-07-18: qty 2

## 2018-07-18 MED ORDER — ACETAMINOPHEN 325 MG PO TABS: 650.0000 mg | ORAL_TABLET | Freq: Four times a day (QID) | ORAL | Status: DC | PRN

## 2018-07-18 MED ORDER — TIOTROPIUM BROMIDE MONOHYDRATE 18 MCG IN CAPS
18.0000 ug | ORAL_CAPSULE | Freq: Every day | RESPIRATORY_TRACT | Status: DC
  Administered 2018-07-18 – 2018-07-20 (×3): 18 ug via RESPIRATORY_TRACT
  Filled 2018-07-18: qty 5

## 2018-07-18 MED ORDER — SENNOSIDES-DOCUSATE SODIUM 8.6-50 MG PO TABS
2.0000 | ORAL_TABLET | Freq: Two times a day (BID) | ORAL | Status: DC
  Administered 2018-07-18 – 2018-07-20 (×4): 2 via ORAL
  Filled 2018-07-18 (×4): qty 2

## 2018-07-18 MED ORDER — ONDANSETRON HCL 4 MG/2ML IJ SOLN: 4.0000 mg | Freq: Four times a day (QID) | INTRAMUSCULAR | Status: DC | PRN

## 2018-07-18 MED ORDER — HYDRALAZINE HCL 20 MG/ML IJ SOLN: 5.0000 mg | INTRAMUSCULAR | Status: DC | PRN

## 2018-07-18 MED ORDER — ACETAMINOPHEN ER 650 MG PO TBCR: 650.0000 mg | EXTENDED_RELEASE_TABLET | Freq: Two times a day (BID) | ORAL | Status: DC | PRN

## 2018-07-18 MED ORDER — MOMETASONE FURO-FORMOTEROL FUM 200-5 MCG/ACT IN AERO
2.0000 | INHALATION_SPRAY | Freq: Two times a day (BID) | RESPIRATORY_TRACT | Status: DC
  Administered 2018-07-18 – 2018-07-20 (×4): 2 via RESPIRATORY_TRACT
  Filled 2018-07-18: qty 8.8

## 2018-07-18 MED ORDER — SODIUM CHLORIDE 0.9 % IV SOLN
1.0000 g | INTRAVENOUS | Status: DC
  Administered 2018-07-18 – 2018-07-19 (×2): 1 g via INTRAVENOUS
  Filled 2018-07-18 (×2): qty 1
  Filled 2018-07-18: qty 10

## 2018-07-18 MED ORDER — TRAMADOL HCL 50 MG PO TABS
50.0000 mg | ORAL_TABLET | Freq: Two times a day (BID) | ORAL | Status: DC
  Administered 2018-07-18 – 2018-07-20 (×4): 50 mg via ORAL
  Filled 2018-07-18 (×4): qty 1

## 2018-07-18 MED ORDER — IPRATROPIUM-ALBUTEROL 0.5-2.5 (3) MG/3ML IN SOLN
3.0000 mL | Freq: Four times a day (QID) | RESPIRATORY_TRACT | Status: DC | PRN
  Administered 2018-07-19: 3 mL via RESPIRATORY_TRACT
  Filled 2018-07-18: qty 3

## 2018-07-18 MED ORDER — BISACODYL 10 MG RE SUPP
10.0000 mg | Freq: Every day | RECTAL | Status: DC | PRN
  Administered 2018-07-18: 22:00:00 10 mg via RECTAL
  Filled 2018-07-18 (×3): qty 1

## 2018-07-18 MED ORDER — ONDANSETRON HCL 4 MG PO TABS: 4.0000 mg | ORAL_TABLET | Freq: Four times a day (QID) | ORAL | Status: DC | PRN

## 2018-07-18 MED ORDER — LISINOPRIL 5 MG PO TABS
5.0000 mg | ORAL_TABLET | Freq: Every day | ORAL | Status: DC
  Administered 2018-07-19 – 2018-07-20 (×2): 5 mg via ORAL
  Filled 2018-07-18 (×2): qty 1

## 2018-07-18 MED ORDER — METRONIDAZOLE IN NACL 5-0.79 MG/ML-% IV SOLN
500.0000 mg | Freq: Three times a day (TID) | INTRAVENOUS | Status: DC
  Administered 2018-07-18: 500 mg via INTRAVENOUS
  Filled 2018-07-18: qty 100

## 2018-07-18 MED ORDER — SODIUM CHLORIDE 0.9 % IV SOLN
INTRAVENOUS | Status: DC
  Administered 2018-07-18: 14:00:00 via INTRAVENOUS

## 2018-07-18 MED ORDER — SODIUM CHLORIDE 0.9 % IV BOLUS
1000.0000 mL | Freq: Once | INTRAVENOUS | Status: AC
  Administered 2018-07-18: 1000 mL via INTRAVENOUS

## 2018-07-18 MED ORDER — ACETAMINOPHEN 325 MG PO TABS
650.0000 mg | ORAL_TABLET | Freq: Four times a day (QID) | ORAL | Status: DC | PRN
  Administered 2018-07-18: 16:00:00 650 mg via ORAL
  Filled 2018-07-18: qty 2

## 2018-07-18 MED ORDER — METOPROLOL TARTRATE 25 MG PO TABS
25.0000 mg | ORAL_TABLET | Freq: Two times a day (BID) | ORAL | Status: DC
  Administered 2018-07-18 – 2018-07-20 (×5): 25 mg via ORAL
  Filled 2018-07-18 (×5): qty 1

## 2018-07-18 MED ORDER — ENSURE ENLIVE PO LIQD
237.0000 mL | Freq: Two times a day (BID) | ORAL | Status: DC
  Administered 2018-07-18 – 2018-07-20 (×4): 237 mL via ORAL

## 2018-07-18 MED ORDER — POLYETHYLENE GLYCOL 3350 17 G PO PACK
17.0000 g | PACK | Freq: Every day | ORAL | Status: DC
  Administered 2018-07-18 – 2018-07-20 (×3): 17 g via ORAL
  Filled 2018-07-18 (×3): qty 1

## 2018-07-18 MED ORDER — VANCOMYCIN HCL IN DEXTROSE 1-5 GM/200ML-% IV SOLN
1000.0000 mg | Freq: Once | INTRAVENOUS | Status: AC
  Administered 2018-07-18: 1000 mg via INTRAVENOUS
  Filled 2018-07-18: qty 200

## 2018-07-18 MED ORDER — ENOXAPARIN SODIUM 40 MG/0.4ML ~~LOC~~ SOLN
40.0000 mg | SUBCUTANEOUS | Status: DC
  Administered 2018-07-18 – 2018-07-19 (×2): 40 mg via SUBCUTANEOUS
  Filled 2018-07-18: qty 0.4

## 2018-07-18 MED ORDER — INFLUENZA VAC SPLIT HIGH-DOSE 0.5 ML IM SUSY
0.5000 mL | PREFILLED_SYRINGE | INTRAMUSCULAR | Status: AC
  Administered 2018-07-19: 0.5 mL via INTRAMUSCULAR
  Filled 2018-07-18: qty 0.5

## 2018-07-18 NOTE — ED Provider Notes (Signed)
[facility] Emergency Department Provider Note  Time seen: 10:29 AM  I have reviewed the triage vital signs and the nursing notes.   HISTORY  Chief Complaint Altered Mental Status    HPI Janice Hoffman is a 84 y.o. female a past medical history of asthma, COPD, hypertension, presents to the emergency department for altered mental status.  According to family patient has mild dementia at baseline but is still very high functioning.  Since this morning she has been moaning, and not answering questions or following commands.  Patient found to be febrile to 102.6 in the emergency department.  Son denies any cough or congestion, did state one episode of vomiting this morning, denies any diarrhea, states constipation but this is fairly typical for her per son.  Currently patient is awake, she is alert but she is moaning, did take deep breaths when instructed to but otherwise does not providing any history or following complex commands.   Past Medical History:  Diagnosis Date  . Asthma   . COPD (chronic obstructive pulmonary disease) ([place])   . Herpes zoster   . Hypertension   . Neuralgia     Patient Active Problem List   Diagnosis Date Noted  . Acute exacerbation of chronic obstructive pulmonary disease (COPD) ([place]) 03/14/2018  . Acute on chronic respiratory failure with hypoxemia ([place]) 02/22/2018  . CAP (community acquired pneumonia) 08/18/2017  . New onset a-fib ([place]) 06/15/2017  . Near syncope 05/31/2017  . COPD with acute exacerbation ([place]) 01/05/2017  . Essential hypertension, malignant 01/05/2017  . Dysphagia 01/05/2017  . COPD exacerbation ([place]) 03/15/2016  . Osteoarthritis 10/12/2014  . Osteoporosis, post-menopausal 10/12/2014  . Pernicious anemia 06/07/2014  . Vitamin D deficiency 06/07/2014  . Post-traumatic osteoarthritis of left knee 05/24/2014  . Pain in joint, lower leg 08/03/2013  . 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 ELEVATED BLOOD  PRESSURE 05/12/2008  . HERPES ZOSTER 04/26/2008  . B12 DEFICIENCY 04/26/2008  . CONSTIPATION 04/26/2008  . LEG EDEMA, BILATERAL 04/26/2008    Past Surgical History:  Procedure Laterality Date  . LEG SURGERY      Prior to Admission medications   Medication Sig Start Date End Date Taking? Authorizing Provider  albuterol (PROVENTIL HFA;VENTOLIN HFA) 108 (90 Base) MCG/ACT inhaler Inhale 2 puffs into the lungs every 6 (six) hours as needed for wheezing or shortness of breath.    [provider]  cyanocobalamin (,VITAMIN B-12,) 1000 MCG/ML injection Inject 1,000 mcg into the skin every 30 (thirty) days.     [provider]  feeding supplement, ENSURE ENLIVE, (ENSURE ENLIVE) LIQD Take 237 mLs by mouth 2 (two) times daily between meals. 03/18/16   [name], MD  Fluticasone-Salmeterol (ADVAIR) 500-50 MCG/DOSE AEPB Inhale 1 puff into the lungs 2 (two) times daily.    [provider]  ipratropium-albuterol (DUONEB) 0.5-2.5 (3) MG/3ML SOLN Take 3 mLs by nebulization every 6 (six) hours as needed.    [provider]  lisinopril (PRINIVIL,ZESTRIL) 5 MG tablet Take 1 tablet (5 mg total) by mouth daily. 02/23/18 04/24/18  Henreitta Leber, MD  metoprolol tartrate (LOPRESSOR) 25 MG tablet Take 1 tablet (25 mg total) by mouth 2 (two) times daily. 08/20/17   Gladstone Lighter, MD  tiotropium (SPIRIVA HANDIHALER) 18 MCG inhalation capsule Place 1 capsule (18 mcg total) into inhaler and inhale daily. 02/23/18 04/24/18  Henreitta Leber, MD  traMADol (ULTRAM) 50 MG tablet Take 50 mg by mouth every 6 (six) hours as needed for moderate  pain or severe pain. Reported on 03/14/2016    [provider]    Allergies  Allergen Reactions  . Aspirin Shortness Of Breath  . Nsaids Shortness Of Breath and Anaphylaxis    Throat swelling, shortness of breath  . Sulfa Antibiotics Other (See Comments)    Stomach pain Stomach pain   . Codeine Other (See Comments)    Short of  breath   . Ciprofloxacin Hives  . Penicillins     Has patient had a PCN reaction causing immediate rash, facial/tongue/throat swelling, SOB or lightheadedness with hypotension: Unknown Has patient had a PCN reaction causing severe rash involving mucus membranes or skin necrosis: Unknown Has patient had a PCN reaction that required hospitalization: Unknown Has patient had a PCN reaction occurring within the last 10 years: Unknown If all of the above answers are "NO", then may proceed with Cephalosporin use.   Marland Kitchen Phenol     Other reaction(s): Angioedema (ALLERGY/intolerance), Other (See Comments) Spasms, nerve symptoms Spasms, nerve symptoms   . Promethazine Hcl     Family History  Problem Relation Age of Onset  . COPD Sister     Social History Social History   Tobacco Use  . Smoking status: Never Smoker  . Smokeless tobacco: Never Used  Substance Use Topics  . Alcohol use: No    Alcohol/week: 0.0 standard drinks  . Drug use: No    Review of Systems Unable to obtain an adequate/accurate review of systems secondary to dementia and altered mental status  ____________________________________________   PHYSICAL EXAM:  VITAL SIGNS: ED Triage Vitals  Enc Vitals Group     BP 07/18/18 1015 (!) 161/76     Pulse Rate 07/18/18 1015 93     Resp 07/18/18 1015 20     Temp 07/18/18 1015 (!) 102.6 F (39.2 C)     Temp Source 07/18/18 1015 Oral     SpO2 07/18/18 1015 97 %     Weight 07/18/18 1016 119 lb 0.8 oz (54 kg)     Height --      Head Circumference --      Peak Flow --      Pain Score 07/18/18 1016 0     Pain Loc --      Pain Edu? --      Excl. in [place]? --    Constitutional: Patient is awake and alert, will follow very simple commands on occasion but not always.  Not answering questions.  Moaning at times.  Does yell with attempted IV sticks. Eyes: Normal exam ENT   Head: Normocephalic and atraumatic.   Mouth/Throat: Mucous membranes are  moist. Cardiovascular: Normal rate, regular rhythm. No murmur Respiratory: Normal respiratory effort without tachypnea nor retractions. Breath sounds are clear Gastrointestinal: Soft and nontender. No distention.  Musculoskeletal: Nontender with normal range of motion in all extremities.  Neurologic: Moaning, does appear to be moving all extremities.  Skin:  Skin is warm, dry and intact.  Psychiatric: Appears to be confused.  ____________________________________________    EKG  EKG reviewed and interpreted by myself shows a sinus rhythm at 91 bpm with a widened QRS, normal axis, largely normal intervals with nonspecific ST changes.  ____________________________________________    RADIOLOGY  Chest x-ray negative  ____________________________________________   INITIAL IMPRESSION / ASSESSMENT AND PLAN / ED COURSE  Pertinent labs & imaging results that were available during my care of the patient were reviewed by me and considered in my medical decision making (see chart for details).  Patient presents to the emergency department for altered mental status found to be febrile to 102.6.  Mildly tachycardic 93 bpm.  Given her altered mental status and temperature 102.6 I have initiated sepsis protocols and we will start broad-spectrum antibiotics.  Differential this time would include urinary tract infection, pneumonia, other infectious etiologies such as intra-abdominal pathology or influenza/viral condition.  We will check labs, cultures, chest x-ray and continue to closely monitor.  Family agreeable to plan of care.  Patient's work-up shows an elevated lactate of 2.2, lab work has returned consistent with a urinary tract infection.  We will continue antibiotics.  Given her lactic elevation altered mental status and initial temperature 102.6 patient will be admitted to the hospitalist service for continued treatment.  Since her arrival approximately 2 hours ago patient has improved  dramatically, she is now awake, talking to me, following commands.   CRITICAL CARE Performed by: Harvest Dark   Total critical care time: 30 minutes  Critical care time was exclusive of separately billable procedures and treating other patients.  Critical care was necessary to treat or prevent imminent or life-threatening deterioration.  Critical care was time spent personally by me on the following activities: development of treatment plan with patient and/or surrogate as well as nursing, discussions with consultants, evaluation of patient's response to treatment, examination of patient, obtaining history from patient or surrogate, ordering and performing treatments and interventions, ordering and review of laboratory studies, ordering and review of radiographic studies, pulse oximetry and re-evaluation of patient's condition.   ____________________________________________   FINAL CLINICAL IMPRESSION(S) / ED DIAGNOSES  Sepsis Altered mental status Urinary tract infection   Harvest Dark, MD 07/18/18 1225

## 2018-07-18 NOTE — Consult Note (Addendum)
CODE SEPSIS - PHARMACY COMMUNICATION  **Broad Spectrum Antibiotics should be administered within 1 hour of Sepsis diagnosis**  Time Code Sepsis Called/Page Received: 0950  Antibiotics Ordered: Aztreonam, Flagyl, Vancomycin  Time of 1st antibiotic administration: 10:47  Additional action taken by pharmacy: Called  If necessary, Name of Provider/Nurse Contacted: Amy, RN    Paticia Stack, PharmD Pharmacy Resident  07/18/2018 10:49 AM

## 2018-07-18 NOTE — H&P (Addendum)
[place] [facility] [place] NAME: Janice Hoffman    MR#:  [id number]  DATE OF BIRTH:  1926-04-05  DATE OF ADMISSION:  07/18/2018  PRIMARY CARE PHYSICIAN: Baxter Hire, MD   REQUESTING/REFERRING PHYSICIAN: Harvest Dark, MD  CHIEF COMPLAINT:   Chief Complaint  Patient presents with  . Altered Mental Status    HISTORY OF PRESENT ILLNESS:  Janice Hoffman  is a 84 y.o. female with a known history of COPD and hypertension who presented to the ED with altered mental status that started late last night.  She lives at home with her son, who noted that she was very confused compared to baseline.  He states she was acting like she had "drank 1/5 of liquor" even though he knew that this did not happen.  She has not had any fevers or chills at home. She denies any dysuria, frequency, or urgency.  No changes in the color or odor of her urine.  She uses the toilet about 70% of the time, but also uses depends in case of accidents. She has had constipation and has not had a bowel movement in 1 week.  No nausea or vomiting.  No cough or shortness of breath.  At baseline, she uses a walker to get around and is very high functioning.  In the ED, she was febrile to 102.6 and tachycardic to the mid 90s.  Labs were significant for WBC 7.3 and lactic acid 2.2.  UA with small hemoglobin, small leukocytes, negative nitrites, few bacteria, +white blood cell clumps.  Code sepsis was called.  Started on broad spectrum antibiotics.  Hospitalists were called for admission.  PAST MEDICAL HISTORY:   Past Medical History:  Diagnosis Date  . Asthma   . COPD (chronic obstructive pulmonary disease) ([place])   . Herpes zoster   . Hypertension   . Neuralgia     PAST SURGICAL HISTORY:   Past Surgical History:  Procedure Laterality Date  . LEG SURGERY      SOCIAL HISTORY:   Social History   Tobacco Use  . Smoking status: Never Smoker  . Smokeless tobacco: Never Used    Substance Use Topics  . Alcohol use: No    Alcohol/week: 0.0 standard drinks    FAMILY HISTORY:   Family History  Problem Relation Age of Onset  . COPD Sister     DRUG ALLERGIES:   Allergies  Allergen Reactions  . Aspirin Shortness Of Breath  . Nsaids Shortness Of Breath and Anaphylaxis    Throat swelling, shortness of breath  . Sulfa Antibiotics Other (See Comments)    Stomach pain Stomach pain   . Codeine Other (See Comments)    Short of breath   . Ciprofloxacin Hives  . Penicillins     Has patient had a PCN reaction causing immediate rash, facial/tongue/throat swelling, SOB or lightheadedness with hypotension: Unknown Has patient had a PCN reaction causing severe rash involving mucus membranes or skin necrosis: Unknown Has patient had a PCN reaction that required hospitalization: Unknown Has patient had a PCN reaction occurring within the last 10 years: Unknown If all of the above answers are "NO", then may proceed with Cephalosporin use.   Marland Kitchen Phenol     Other reaction(s): Angioedema (ALLERGY/intolerance), Other (See Comments) Spasms, nerve symptoms Spasms, nerve symptoms   . Promethazine Hcl     REVIEW OF SYSTEMS:   Review of Systems  Constitutional: Positive for fever. Negative for chills.  HENT:  Negative for congestion and sore throat.   Eyes: Negative for blurred vision and double vision.  Respiratory: Negative for cough and shortness of breath.   Cardiovascular: Negative for chest pain, palpitations and leg swelling.  Gastrointestinal: Positive for constipation. Negative for abdominal pain, diarrhea, nausea and vomiting.  Genitourinary: Negative for dysuria, flank pain, frequency, hematuria and urgency.  Musculoskeletal: Negative for back pain and neck pain.  Neurological: Negative for dizziness and headaches.  Psychiatric/Behavioral: Negative for depression. The patient is not nervous/anxious.     MEDICATIONS AT HOME:   Prior to Admission  medications   Medication Sig Start Date End Date Taking? Authorizing Provider  acetaminophen (TYLENOL) 650 MG CR tablet Take 650 mg by mouth 2 (two) times daily as needed for pain.   Yes [provider]  cyanocobalamin (,VITAMIN B-12,) 1000 MCG/ML injection Inject 1,000 mcg into the skin every 30 (thirty) days.    Yes [provider]  feeding supplement, ENSURE ENLIVE, (ENSURE ENLIVE) LIQD Take 237 mLs by mouth 2 (two) times daily between meals. 03/18/16  Yes Mody, Ulice Bold, MD  Fluticasone-Salmeterol (ADVAIR) 500-50 MCG/DOSE AEPB Inhale 1 puff into the lungs 2 (two) times daily.   Yes [provider]  metoprolol tartrate (LOPRESSOR) 25 MG tablet Take 1 tablet (25 mg total) by mouth 2 (two) times daily. 08/20/17  Yes Gladstone Lighter, MD  tiotropium (SPIRIVA HANDIHALER) 18 MCG inhalation capsule Place 1 capsule (18 mcg total) into inhaler and inhale daily. 02/23/18 07/18/18 Yes Sainani, Belia Heman, MD  traMADol (ULTRAM) 50 MG tablet Take 50 mg by mouth 2 (two) times daily.    Yes [provider]  lisinopril (PRINIVIL,ZESTRIL) 5 MG tablet Take 1 tablet (5 mg total) by mouth daily. 02/23/18 04/24/18  Henreitta Leber, MD      VITAL SIGNS:  Blood pressure (!) 146/70, pulse 73, temperature (!) 102.6 F (39.2 C), temperature source Oral, resp. rate 19, weight 54 kg, SpO2 97 %.  PHYSICAL EXAMINATION:  Physical Exam  GENERAL:  84 y.o.-year-old patient lying in the bed with no acute distress.  EYES: Pupils equal, round, reactive to light and accommodation. No scleral icterus. Extraocular muscles intact.  HEENT: Head atraumatic, normocephalic. Oropharynx and nasopharynx clear.  NECK:  Supple, no jugular venous distention. No thyroid enlargement, no tenderness.  LUNGS: Normal breath sounds bilaterally, no wheezing, rales,rhonchi or crepitation. No use of accessory muscles of respiration.  CARDIOVASCULAR: RRR, S1, S2 normal. No murmurs, rubs, or gallops.  BACK: No CVA  tenderness ABDOMEN: Soft, nontender, nondistended. Bowel sounds present. No organomegaly or mass. +abdominal wall hernia present to the left of the umbilicus, hernia is easily reducible and non-tender. EXTREMITIES: No pedal edema, cyanosis, or clubbing.  NEUROLOGIC: Cranial nerves II through XII are intact. +global weakness. Sensation intact. Gait not checked.  PSYCHIATRIC: The patient is alert. Does not answer questions of orientation. SKIN: No obvious rash, lesion, or ulcer.   LABORATORY PANEL:   CBC Recent Labs  Lab 07/18/18 1041  WBC 7.3  HGB 10.4*  HCT 32.4*  PLT 218   ------------------------------------------------------------------------------------------------------------------  Chemistries  Recent Labs  Lab 07/18/18 1041  NA 134*  K 3.8  CL 100  CO2 25  GLUCOSE 164*  BUN 15  CREATININE 0.63  CALCIUM 8.7*  AST 15  ALT 10  ALKPHOS 36*  BILITOT 1.0   ------------------------------------------------------------------------------------------------------------------  Cardiac Enzymes No results for input(s): TROPONINI in the last 168 hours. ------------------------------------------------------------------------------------------------------------------  RADIOLOGY:  Dg Chest Port 1 View  Result Date: 07/18/2018 CLINICAL  DATA:  Sepsis.  History of COPD and hypertension. EXAM: PORTABLE CHEST 1 VIEW COMPARISON:  03/13/2018 and older exams. FINDINGS: Cardiac silhouette is normal in size. No mediastinal or hilar masses. There are prominent bronchovascular markings. Lungs are relatively hyperexpanded. There is mild reticular scarring in the bases. These findings are stable. No evidence of pneumonia or pulmonary edema. No pleural effusion or pneumothorax. Skeletal structures are demineralized but grossly intact. IMPRESSION: No acute cardiopulmonary disease. Electronically Signed   By: Lajean Manes M.D.   On: 07/18/2018 11:19    IMPRESSION AND PLAN:   Sepsis secondary  to UTI- patient initially tachycardic and febrile with a lactic acid of 2.2.  UA with signs of infection. CXR negative. -S/p 1 L normal saline bolus, will continue maintenance IV fluids -Change antibiotics to ceftriaxone -Follow-up blood and urine cultures  Altered mental status- likely due to above.  Has already greatly improved with fluids.   -Treatment as above -Monitor  Lactic acidosis- likely due to sepsis. -Fluids as above -Trend lactic acid  Hypertension- BPs mildly elevated in the ED -Continue home lisinopril and lopressor -Hydralazine prn  COPD- stable, no signs of acute exacerbation. Follows with Dr. Mortimer Fries as an outpatient. -Continue home inhalers -Duonebs prn  Chronic constipation-has not had a bowel movement in 1 week -Continue miralax daily -Add Senokot-S 2 tablets twice daily  Hyperglycemia- glucose 164 in the ED -Check A1c  Normocytic anemia- Hgb decreased from 14.3 at last hospitalization to 10.4 today. -Check anemia panel and FOBT   All the records are reviewed and case discussed with ED provider. Management plans discussed with the patient, family and they are in agreement.  CODE STATUS: DNR  TOTAL TIME TAKING CARE OF THIS PATIENT: 45 minutes.    Berna Spare Megumi Treaster M.D on 07/18/2018 at 1:01 PM  Between 7am to 6pm - Pager - [phone]  After 6pm go to [email or web address] - Proofreader  [facility]  Office  [phone]  CC: Primary care physician; Baxter Hire, MD   Note: This dictation was prepared with Dragon dictation along with smaller phrase technology. Any transcriptional errors that result from this process are unintentional.

## 2018-07-18 NOTE — Progress Notes (Signed)
Family Meeting Note  Advance Directive:yes  Today a meeting took place with the Patient and son.  Patient is able to participate.  The following clinical team members were present during this meeting:MD  The following were discussed:Patient's diagnosis: , Patient's progosis: Unable to determine and Goals for treatment: DNR   Discussion held with patient and son regarding patient's CODE STATUS.  Given patient's age and mild dementia, patient and son agree that a DNR is most appropriate.  Patient has always felt that she would not want to be resuscitated.  Additional follow-up to be provided: prn  Time spent during discussion:20 minutes  Evette Doffing, MD

## 2018-07-18 NOTE — ED Notes (Signed)
Purewick placed on pt. Pt cleaned and bed sheets changed as well.

## 2018-07-18 NOTE — ED Notes (Signed)
Date and time results received: 07/18/18 11:29 AM     Test: Lactic Critical Value: 2.2  Name of Provider Notified: Dr. Kerman Passey  Orders Received? Or Actions Taken?: Orders Received - See Orders for details

## 2018-07-18 NOTE — ED Notes (Signed)
ED Provider at bedside. 

## 2018-07-18 NOTE — ED Triage Notes (Signed)
Pt here with son who reports last night pt began to act abnormal, pt usually is walking and talking independently however she has not been able to do that since last night. Pt denies pain. Has fever in triage.

## 2018-07-19 LAB — BASIC METABOLIC PANEL
Anion gap: 6 (ref 5–15)
BUN: 10 mg/dL (ref 8–23)
CALCIUM: 8.2 mg/dL — AB (ref 8.9–10.3)
CHLORIDE: 103 mmol/L (ref 98–111)
CO2: 26 mmol/L (ref 22–32)
CREATININE: 0.44 mg/dL (ref 0.44–1.00)
GFR calc non Af Amer: >60 mL/min (ref >60)
Glucose, Bld: 92 mg/dL (ref 70–99)
Potassium: 3.8 mmol/L (ref 3.5–5.1)
Sodium: 135 mmol/L (ref 135–145)

## 2018-07-19 LAB — HEMOGLOBIN A1C
Hgb A1c MFr Bld: 5.2 % (ref 4.8–5.6)
Mean Plasma Glucose: 102.54 mg/dL

## 2018-07-19 LAB — CBC
HEMATOCRIT: 30 % — AB (ref 36.0–46.0)
Hemoglobin: 9.5 g/dL — ABNORMAL LOW (ref 12.0–15.0)
MCH: 29.6 pg (ref 26.0–34.0)
MCHC: 31.7 g/dL (ref 30.0–36.0)
MCV: 93.5 fL (ref 80.0–100.0)
NRBC: 0 % (ref 0.0–0.2)
PLATELETS: 168 10*3/uL (ref 150–400)
RBC: 3.21 MIL/uL — ABNORMAL LOW (ref 3.87–5.11)
RDW: 14 % (ref 11.5–15.5)
WBC: 5.7 10*3/uL (ref 4.0–10.5)

## 2018-07-19 NOTE — Progress Notes (Signed)
[place] at [place] NAME: Janice Hoffman    MR#:  [id number]  DATE OF BIRTH:  04-26-1926  SUBJECTIVE:  CHIEF COMPLAINT:   Chief Complaint  Patient presents with  . Altered Mental Status   The patient has no complaints. REVIEW OF SYSTEMS:  Review of Systems  Constitutional: Negative for chills, fever and malaise/fatigue.  HENT: Negative for sore throat.   Eyes: Negative for blurred vision and double vision.  Respiratory: Negative for cough, hemoptysis, shortness of breath, wheezing and stridor.   Cardiovascular: Negative for chest pain, palpitations, orthopnea and leg swelling.  Gastrointestinal: Negative for abdominal pain, blood in stool, diarrhea, melena, nausea and vomiting.  Genitourinary: Negative for dysuria, flank pain and hematuria.  Musculoskeletal: Negative for back pain and joint pain.  Skin: Negative for rash.  Neurological: Negative for dizziness, sensory change, focal weakness, seizures, loss of consciousness, weakness and headaches.  Endo/Heme/Allergies: Negative for polydipsia.  Psychiatric/Behavioral: Negative for depression. The patient is not nervous/anxious.     DRUG ALLERGIES:   Allergies  Allergen Reactions  . Aspirin Shortness Of Breath  . Nsaids Shortness Of Breath and Anaphylaxis    Throat swelling, shortness of breath  . Sulfa Antibiotics Other (See Comments)    Stomach pain Stomach pain   . Codeine Other (See Comments)    Short of breath   . Ciprofloxacin Hives  . Penicillins     Has patient had a PCN reaction causing immediate rash, facial/tongue/throat swelling, SOB or lightheadedness with hypotension: Unknown Has patient had a PCN reaction causing severe rash involving mucus membranes or skin necrosis: Unknown Has patient had a PCN reaction that required hospitalization: Unknown Has patient had a PCN reaction occurring within the last 10 years: Unknown If all of the above answers are "NO",  then may proceed with Cephalosporin use.   Marland Kitchen Phenol     Other reaction(s): Angioedema (ALLERGY/intolerance), Other (See Comments) Spasms, nerve symptoms Spasms, nerve symptoms   . Promethazine Hcl    VITALS:  Blood pressure 110/70, pulse 73, temperature 98.3 F (36.8 C), temperature source Oral, resp. rate 18, height 5\' 4"  (1.626 m), weight 54 kg, SpO2 97 %. PHYSICAL EXAMINATION:  Physical Exam  Constitutional: She is oriented to person, place, and time. No distress.  HENT:  Head: Normocephalic.  Mouth/Throat: Oropharynx is clear and moist.  Eyes: Pupils are equal, round, and reactive to light. Conjunctivae and EOM are normal. No scleral icterus.  Neck: Normal range of motion. Neck supple. No JVD present. No tracheal deviation present.  Cardiovascular: Normal rate, regular rhythm and normal heart sounds. Exam reveals no gallop.  No murmur heard. Pulmonary/Chest: Effort normal and breath sounds normal. No respiratory distress. She has no wheezes. She has no rales.  Abdominal: Soft. Bowel sounds are normal. She exhibits no distension. There is no tenderness. There is no rebound.  Musculoskeletal: Normal range of motion. She exhibits no edema or tenderness.  Neurological: She is alert and oriented to person, place, and time. No cranial nerve deficit.  Skin: No rash noted. No erythema.   LABORATORY PANEL:  Female CBC Recent Labs  Lab 07/19/18 0632  WBC 5.7  HGB 9.5*  HCT 30.0*  PLT 168   ------------------------------------------------------------------------------------------------------------------ Chemistries  Recent Labs  Lab 07/18/18 1041  07/19/18 0459  NA 134*  --  135  K 3.8  --  3.8  CL 100  --  103  CO2 25  --  26  GLUCOSE 164*  --  92  BUN 15  --  10  CREATININE 0.63   < > 0.44  CALCIUM 8.7*  --  8.2*  AST 15  --   --   ALT 10  --   --   ALKPHOS 36*  --   --   BILITOT 1.0  --   --    < > = values in this interval not displayed.   RADIOLOGY:  No results  found. ASSESSMENT AND PLAN:   Sepsis secondary to UTI- patient initially tachycardic and febrile with a lactic acid of 2.2.  UA with signs of infection. CXR negative. -S/p 1 L normal saline bolus, discontinue IV fluids Continue ceftriaxone -Follow-up blood and urine cultures  Altered mental status- likely due to above.  Has already greatly improved with fluids.   Lactic acidosis- likely due to sepsis. Improved.  Hypertension. -Continue home lisinopril and lopressor -Hydralazine prn  COPD- stable, no signs of acute exacerbation. Follows with Dr. Mortimer Fries as an outpatient. -Continue home inhalers -Duonebs prn  Chronic constipation-has not had a bowel movement in 1 week -Continue miralax daily, Senokot-S 2 tablets twice daily, Dulcolax as needed.  Hyperglycemia- glucose 164 in the ED -Check A1c 5.2.  Normocytic anemia-stable.  All the records are reviewed and case discussed with Care Management/Social Worker. Management plans discussed with the patient, family and they are in agreement.  CODE STATUS: DNR  TOTAL TIME TAKING CARE OF THIS PATIENT: 28 minutes.   More than 50% of the time was spent in counseling/coordination of care: YES  POSSIBLE D/C IN 1-2 DAYS, DEPENDING ON CLINICAL CONDITION.   Demetrios Loll M.D on 07/19/2018 at 2:01 PM  Between 7am to 6pm - Pager - [phone]  After 6pm go to [email or web address] - [facility]

## 2018-07-19 NOTE — Progress Notes (Signed)
Patient's son spoke with the patient over the phone and was concerned that he heard his mother wheeze, he asked if her inhalers were administered. The medication administration was reviewed with him this evening, last night with night shift and yesterday with day shift about her two inhalers. She has received them as ordered. Patient was given a breathing treatment. No audible wheezing is heard on assessment after treatment.

## 2018-07-19 NOTE — Evaluation (Signed)
Physical Therapy Evaluation Patient Details Name: Janice Hoffman MRN: [id number] DOB: 08-Sep-1926 Today's Date: 07/19/2018   History of Present Illness  Janice Hoffman is a 84yo female comes to [facility] via Son after noted ataxia and AMS at home. Pt found to have UTI sepsis. PMH: COPD, mild dementia, HTN, neuralia. Pt is familiar to evaluating therapist from prior admission.   Clinical Impression  Pt admitted with above diagnosis. Pt currently with functional limitations due to the deficits listed below (see "PT Problem List"). Upon entry, pt in bed, no family/caregiver present. The pt is awake and reluctant to participate, endorsing confidence in her own abilities, but eventually is agreeable to get up to chair. The pt is alert and oriented to self, pleasant, conversational, and refutes any recent cognitive changes. Pt with difficulty following simple commands (may be 2/2 HOH) but also attending to directions already received for mobility. RW use is safe, and no acute LBO noted, but patient has very poor awareness of line/lead safety. Functional mobility assessment demonstrates mild increased effort/time requirements, fair tolerance, low motivation for additional "meaningless activity," but no need for physical assistance, whereas the patient performed these at a higher level of independence PTA. Pt will benefit from skilled PT intervention to increase independence and safety with basic mobility in preparation for discharge to the venue listed below.       Follow Up Recommendations No PT follow up(has had PT in the past; no acute gait impairment noted, other than cognitive related. )    Equipment Recommendations  None recommended by PT    Recommendations for Other Services       Precautions / Restrictions Precautions Precautions: Fall Restrictions Weight Bearing Restrictions: No      Mobility  Bed Mobility Overal bed mobility: Needs Assistance Bed Mobility: Sit to Supine       Sit to  supine: Supervision   General bed mobility comments: poor safety awareness of line/leads  Transfers Overall transfer level: Needs assistance   Transfers: Sit to/from Stand Sit to Stand: Supervision         General transfer comment: poor safety awareness of line/leads, safe RW use noted.   Ambulation/Gait Ambulation/Gait assistance: Min guard Gait Distance (Feet): 25 Feet Assistive device: Rolling walker (2 wheeled)       General Gait Details: poor safety awareness of line/leads, shortt erm memory difficulty with attending to directions for AMB, difficulty folowing simple cues for gait.   Stairs            Wheelchair Mobility    Modified Rankin (Stroke Patients Only)       Balance Overall balance assessment: Needs assistance(frequent admissions with gait instbaility adn recommendations for RW use. )                                           Pertinent Vitals/Pain Pain Assessment: No/denies pain    Home Living Family/patient expects to be discharged to:: Private residence Living Arrangements: Children Available Help at Discharge: Family Type of Home: House Home Access: Stairs to enter Entrance Stairs-Rails: Right Entrance Stairs-Number of Steps: 5 Home Layout: One level Home Equipment: Environmental consultant - 2 wheels;Wheelchair - manual;Cane - single point      Prior Function Level of Independence: Independent with assistive device(s)         Comments: Pt reports no RW use, although medical record shows long history of gait  dysfunction with recommendations for RW use     Hand Dominance   Dominant Hand: Right    Extremity/Trunk Assessment                Communication   Communication: HOH  Cognition Arousal/Alertness: Awake/alert Behavior During Therapy: (mildly uninterest in participating in mobility at this time) Overall Cognitive Status: History of cognitive impairments - at baseline                                         General Comments      Exercises     Assessment/Plan    PT Assessment Patient needs continued PT services  PT Problem List Decreased activity tolerance;Decreased balance;Decreased mobility       PT Treatment Interventions Balance training;Functional mobility training;Therapeutic activities;Therapeutic exercise;Patient/family education    PT Goals (Current goals can be found in the Care Plan section)  Acute Rehab PT Goals Patient Stated Goal: return to home and be active.  PT Goal Formulation: With patient Time For Goal Achievement: 08/02/18 Potential to Achieve Goals: Fair    Frequency Min 2X/week   Barriers to discharge        Co-evaluation               AM-PAC PT "6 Clicks" Daily Activity  Outcome Measure Difficulty turning over in bed (including adjusting bedclothes, sheets and blankets)?: A Little Difficulty moving from lying on back to sitting on the side of the bed? : A Little Difficulty sitting down on and standing up from a chair with arms (e.g., wheelchair, bedside commode, etc,.)?: A Little Help needed moving to and from a bed to chair (including a wheelchair)?: A Little Help needed walking in hospital room?: A Little Help needed climbing 3-5 steps with a railing? : A Little 6 Click Score: 18    End of Session Equipment Utilized During Treatment: Gait belt;Oxygen Activity Tolerance: Patient tolerated treatment well;No increased pain;Treatment limited secondary to agitation Patient left: in chair;with call bell/phone within reach Nurse Communication: Mobility status(needs purewick put back in place) PT Visit Diagnosis: Unsteadiness on feet (R26.81);Other abnormalities of gait and mobility (R26.89)    Time: 2979-8921 PT Time Calculation (min) (ACUTE ONLY): 17 min   Charges:   PT Evaluation $PT Eval Low Complexity: 1 Low         12:23 PM, 07/19/18 Etta Grandchild, PT, DPT Physical Therapist - [facility]  [phone] ([place])     Thayer Inabinet C 07/19/2018, 12:19 PM

## 2018-07-20 LAB — URINE CULTURE

## 2018-07-20 MED ORDER — CEPHALEXIN 250 MG PO CAPS: 250.0000 mg | ORAL_CAPSULE | Freq: Three times a day (TID) | ORAL | 0 refills | Status: AC

## 2018-07-20 NOTE — Discharge Instructions (Signed)
Resume diet and activity as before ° ° °

## 2018-07-20 NOTE — Discharge Summary (Signed)
[facility] NAME: Janice Hoffman    MR#:  [id number]  DATE OF BIRTH:  1926/05/07  DATE OF ADMISSION:  07/18/2018 ADMITTING PHYSICIAN: Sela Hua, MD  DATE OF DISCHARGE: 07/20/2018 12:27 PM  PRIMARY CARE PHYSICIAN: Baxter Hire, MD   ADMISSION DIAGNOSIS:  Urinary tract infection without hematuria, site unspecified [N39.0] Altered mental status, unspecified altered mental status type [R41.82] Sepsis, due to unspecified organism, unspecified whether acute organ dysfunction present ([place]) [A41.9]  DISCHARGE DIAGNOSIS:  Active Problems:   Sepsis ([place])   SECONDARY DIAGNOSIS:   Past Medical History:  Diagnosis Date  . Asthma   . COPD (chronic obstructive pulmonary disease) ([place])   . Herpes zoster   . Hypertension   . Neuralgia      ADMITTING HISTORY  HISTORY OF PRESENT ILLNESS:  Janice Hoffman  is a 84 y.o. female with a known history of COPD and hypertension who presented to the ED with altered mental status that started late last night.  She lives at home with her son, who noted that she was very confused compared to baseline.  He states she was acting like she had "drank 1/5 of liquor" even though he knew that this did not happen.  She has not had any fevers or chills at home. She denies any dysuria, frequency, or urgency.  No changes in the color or odor of her urine.  She uses the toilet about 70% of the time, but also uses depends in case of accidents. She has had constipation and has not had a bowel movement in 1 week.  No nausea or vomiting.  No cough or shortness of breath.  At baseline, she uses a walker to get around and is very high functioning.  In the ED, she was febrile to 102.6 and tachycardic to the mid 90s.  Labs were significant for WBC 7.3 and lactic acid 2.2.  UA with small hemoglobin, small leukocytes, negative nitrites, few bacteria, +white blood cell clumps.  Code sepsis was called.  Started on broad  spectrum antibiotics.  Hospitalists were called for admission.  HOSPITAL COURSE:   Sepsis secondary to UTI Admitted to medical floor with IV antibiotics.  Cultures returned with E. coli.  Patient will be switched to Keflex at discharge. Sepsis has resolved. No abdominal pain or dysuria by the day of discharge  Acute metabolic encephalopathy secondary to UTI and sepsis.  Resolved.  Lactic acidosis- due to sepsis. Improved.  Hypertension. -Continue home lisinopril andlopressor -Hydralazine prn  COPD-stable, no signs of acute exacerbation.Follows with Dr. Mortimer Fries as an outpatient. -Continue home inhalers -Duonebs prn  Chronic constipation-has not had a bowel movement in 1 week -Continuemiralaxdaily, Senokot-S 2 tablets twice daily, Dulcolax as needed. Had a bowel movement in the hospital  Hyperglycemia- glucose 164 in the ED -Checked A1c 5.2. Hyperglycemia likely due to acute infection  Normocytic anemia-stable.  By day of discharge patient has ambulated well without any problems.  Afebrile.  Normal WBC.  No dysuria or frequency.  No abdominal pain.  Will be discharged home on Keflex.  Discussed with son prior to discharge.  CONSULTS OBTAINED:    DRUG ALLERGIES:   Allergies  Allergen Reactions  . Aspirin Shortness Of Breath  . Nsaids Shortness Of Breath and Anaphylaxis    Throat swelling, shortness of breath  . Sulfa Antibiotics Other (See Comments)    Stomach pain Stomach pain   . Codeine Other (See Comments)    Short  of breath   . Ciprofloxacin Hives  . Penicillins     Has patient had a PCN reaction causing immediate rash, facial/tongue/throat swelling, SOB or lightheadedness with hypotension: Unknown Has patient had a PCN reaction causing severe rash involving mucus membranes or skin necrosis: Unknown Has patient had a PCN reaction that required hospitalization: Unknown Has patient had a PCN reaction occurring within the last 10 years: Unknown If all  of the above answers are "NO", then may proceed with Cephalosporin use.   Marland Kitchen Phenol     Other reaction(s): Angioedema (ALLERGY/intolerance), Other (See Comments) Spasms, nerve symptoms Spasms, nerve symptoms   . Promethazine Hcl     DISCHARGE MEDICATIONS:   Allergies as of 07/20/2018      Reactions   Aspirin Shortness Of Breath   Nsaids Shortness Of Breath, Anaphylaxis   Throat swelling, shortness of breath   Sulfa Antibiotics Other (See Comments)   Stomach pain Stomach pain   Codeine Other (See Comments)   Short of breath   Ciprofloxacin Hives   Penicillins    Has patient had a PCN reaction causing immediate rash, facial/tongue/throat swelling, SOB or lightheadedness with hypotension: Unknown Has patient had a PCN reaction causing severe rash involving mucus membranes or skin necrosis: Unknown Has patient had a PCN reaction that required hospitalization: Unknown Has patient had a PCN reaction occurring within the last 10 years: Unknown If all of the above answers are "NO", then may proceed with Cephalosporin use.   Phenol    Other reaction(s): Angioedema (ALLERGY/intolerance), Other (See Comments) Spasms, nerve symptoms Spasms, nerve symptoms   Promethazine Hcl       Medication List    TAKE these medications   acetaminophen 650 MG CR tablet Commonly known as:  TYLENOL Take 650 mg by mouth 2 (two) times daily as needed for pain.   cephALEXin 250 MG capsule Commonly known as:  KEFLEX Take 1 capsule (250 mg total) by mouth 3 (three) times daily for 3 days.   cyanocobalamin 1000 MCG/ML injection Commonly known as:  (VITAMIN B-12) Inject 1,000 mcg into the skin every 30 (thirty) days.   feeding supplement (ENSURE ENLIVE) Liqd Take 237 mLs by mouth 2 (two) times daily between meals.   Fluticasone-Salmeterol 500-50 MCG/DOSE Aepb Commonly known as:  ADVAIR Inhale 1 puff into the lungs 2 (two) times daily.   lisinopril 5 MG tablet Commonly known as:   PRINIVIL,ZESTRIL Take 1 tablet (5 mg total) by mouth daily.   metoprolol tartrate 25 MG tablet Commonly known as:  LOPRESSOR Take 1 tablet (25 mg total) by mouth 2 (two) times daily.   tiotropium 18 MCG inhalation capsule Commonly known as:  SPIRIVA Place 1 capsule (18 mcg total) into inhaler and inhale daily.   traMADol 50 MG tablet Commonly known as:  ULTRAM Take 50 mg by mouth 2 (two) times daily.       Today   VITAL SIGNS:  Blood pressure (!) 148/61, pulse (!) 55, temperature (!) 97.2 F (36.2 C), resp. rate 17, height 5\' 4"  (1.626 m), weight 54 kg, SpO2 96 %.  I/O:    Intake/Output Summary (Last 24 hours) at 07/20/2018 1431 Last data filed at 07/20/2018 1008 Gross per 24 hour  Intake 422.73 ml  Output -  Net 422.73 ml    PHYSICAL EXAMINATION:  Physical Exam  GENERAL:  84 y.o.-year-old patient lying in the bed with no acute distress.  LUNGS: Normal breath sounds bilaterally, no wheezing, rales,rhonchi or crepitation. No use of accessory  muscles of respiration.  CARDIOVASCULAR: S1, S2 normal. No murmurs, rubs, or gallops.  ABDOMEN: Soft, non-tender, non-distended. Bowel sounds present. No organomegaly or mass.  NEUROLOGIC: Moves all 4 extremities. PSYCHIATRIC: The patient is alert and oriented x 3.  SKIN: No obvious rash, lesion, or ulcer.   DATA REVIEW:   CBC Recent Labs  Lab 07/19/18 0632  WBC 5.7  HGB 9.5*  HCT 30.0*  PLT 168    Chemistries  Recent Labs  Lab 07/18/18 1041  07/19/18 0459  NA 134*  --  135  K 3.8  --  3.8  CL 100  --  103  CO2 25  --  26  GLUCOSE 164*  --  92  BUN 15  --  10  CREATININE 0.63   < > 0.44  CALCIUM 8.7*  --  8.2*  AST 15  --   --   ALT 10  --   --   ALKPHOS 36*  --   --   BILITOT 1.0  --   --    < > = values in this interval not displayed.    Cardiac Enzymes No results for input(s): TROPONINI in the last 168 hours.  Microbiology Results  Results for orders placed or performed during the hospital  encounter of 07/18/18  Blood Culture (routine x 2)     Status: None (Preliminary result)   Collection Time: 07/18/18 10:41 AM  Result Value Ref Range Status   Specimen Description BLOOD R AC  Final   Special Requests   Final    BOTTLES DRAWN AEROBIC AND ANAEROBIC Blood Culture adequate volume   Culture   Final    NO GROWTH 2 DAYS Performed at [facility], [address] [place], [place] [address]    Report Status PENDING  Incomplete  Blood Culture (routine x 2)     Status: None (Preliminary result)   Collection Time: 07/18/18 10:41 AM  Result Value Ref Range Status   Specimen Description BLOOD L AC  Final   Special Requests   Final    BOTTLES DRAWN AEROBIC AND ANAEROBIC Blood Culture adequate volume   Culture   Final    NO GROWTH 2 DAYS Performed at [facility], [address], [place], [place] [address]    Report Status PENDING  Incomplete  Urine culture     Status: Abnormal   Collection Time: 07/18/18 10:41 AM  Result Value Ref Range Status   Specimen Description   Final    URINE, RANDOM Performed at [facility], [address], [place], [place] [address]    Special Requests   Final    NONE Performed at [facility], [address] [place], [place] [address]    Culture >=100,000 COLONIES/mL ESCHERICHIA COLI (A)  Final   Report Status 07/20/2018 FINAL  Final   Organism ID, Bacteria ESCHERICHIA COLI (A)  Final      Susceptibility   Escherichia coli - MIC*    AMPICILLIN 8 SENSITIVE Sensitive     CEFAZOLIN <=4 SENSITIVE Sensitive     CEFTRIAXONE <=1 SENSITIVE Sensitive     CIPROFLOXACIN <=0.25 SENSITIVE Sensitive     GENTAMICIN <=1 SENSITIVE Sensitive     IMIPENEM <=0.25 SENSITIVE Sensitive     NITROFURANTOIN <=16 SENSITIVE Sensitive     TRIMETH/SULFA <=20 SENSITIVE Sensitive     AMPICILLIN/SULBACTAM 4 SENSITIVE Sensitive     PIP/TAZO <=4 SENSITIVE Sensitive     Extended ESBL NEGATIVE Sensitive     * >=  100,000 COLONIES/mL  ESCHERICHIA COLI    RADIOLOGY:  No results found.  Follow up with PCP in 1 week.  Management plans discussed with the patient, family and they are in agreement.  CODE STATUS:     Code Status Orders  (From admission, onward)         Start     Ordered   07/18/18 1343  Do not attempt resuscitation (DNR)  Continuous    Question Answer Comment  In the event of cardiac or respiratory ARREST Do not call a "code blue"   In the event of cardiac or respiratory ARREST Do not perform Intubation, CPR, defibrillation or ACLS   In the event of cardiac or respiratory ARREST Use medication by any route, position, wound care, and other measures to relive pain and suffering. May use oxygen, suction and manual treatment of airway obstruction as needed for comfort.      07/18/18 1342        Code Status History    Date Active Date Inactive Code Status Order ID Comments User Context   03/14/2018 0146 03/15/2018 1521 Full Code [id number]  Arta Silence, MD Inpatient   02/22/2018 0107 02/23/2018 1927 Full Code [id number]  Harrie Foreman, MD Inpatient   08/18/2017 2323 08/21/2017 1803 Full Code [id number]  Gorden Harms, MD Inpatient   06/15/2017 0332 06/17/2017 1511 Full Code [id number]  Harrie Foreman, MD ED   05/31/2017 1625 06/01/2017 1856 Full Code [id number]  [name], MD Inpatient   01/05/2017 1555 01/06/2017 1648 Full Code [id number]  Theodoro Grist, MD Inpatient   03/15/2016 0225 03/18/2016 2149 Full Code [id number]  Saundra Shelling, MD Inpatient    Advance Directive Documentation     Most Recent Value  Type of Advance Directive  Healthcare Power of Attorney  Pre-existing out of facility DNR order (yellow form or pink MOST form)  -  "MOST" Form in Place?  -      TOTAL TIME TAKING CARE OF THIS PATIENT ON DAY OF DISCHARGE: more than 30 minutes.   Neita Carp M.D on 07/20/2018 at 2:31 PM  Between 7am to 6pm - Pager - [phone]  After 6pm go to [email or web address] - password [email or web address]  [facility]  Office  [phone]  CC: Primary care physician; Baxter Hire, MD  Note: This dictation was prepared with Dragon dictation along with smaller phrase technology. Any transcriptional errors that result from this process are unintentional.

## 2018-07-23 LAB — CULTURE, BLOOD (ROUTINE X 2)
CULTURE: NO GROWTH
Culture: NO GROWTH
SPECIAL REQUESTS: ADEQUATE
Special Requests: ADEQUATE

## 2018-09-14 ENCOUNTER — Emergency Department: Payer: Medicare Other

## 2018-09-14 ENCOUNTER — Other Ambulatory Visit: Payer: Self-pay

## 2018-09-14 ENCOUNTER — Inpatient Hospital Stay
Admission: EM | Admit: 2018-09-14 | Discharge: 2018-09-22 | DRG: 871 | Disposition: A | Discharge status: Skilled Nursing Facility | Payer: Medicare Other | Attending: Internal Medicine | Admitting: Internal Medicine

## 2018-09-14 DIAGNOSIS — Z880 Allergy status to penicillin: Secondary | ICD-10-CM

## 2018-09-14 DIAGNOSIS — G8929 Other chronic pain: Secondary | ICD-10-CM | POA: Diagnosis present

## 2018-09-14 DIAGNOSIS — Z7189 Other specified counseling: Secondary | ICD-10-CM

## 2018-09-14 DIAGNOSIS — Z825 Family history of asthma and other chronic lower respiratory diseases: Secondary | ICD-10-CM

## 2018-09-14 DIAGNOSIS — M25562 Pain in left knee: Secondary | ICD-10-CM | POA: Diagnosis present

## 2018-09-14 DIAGNOSIS — R627 Adult failure to thrive: Secondary | ICD-10-CM | POA: Diagnosis present

## 2018-09-14 DIAGNOSIS — J9621 Acute and chronic respiratory failure with hypoxia: Secondary | ICD-10-CM | POA: Diagnosis present

## 2018-09-14 DIAGNOSIS — A419 Sepsis, unspecified organism: Principal | ICD-10-CM | POA: Diagnosis present

## 2018-09-14 DIAGNOSIS — Z8701 Personal history of pneumonia (recurrent): Secondary | ICD-10-CM

## 2018-09-14 DIAGNOSIS — Z87440 Personal history of urinary (tract) infections: Secondary | ICD-10-CM

## 2018-09-14 DIAGNOSIS — Z515 Encounter for palliative care: Secondary | ICD-10-CM

## 2018-09-14 DIAGNOSIS — Y95 Nosocomial condition: Secondary | ICD-10-CM | POA: Diagnosis present

## 2018-09-14 DIAGNOSIS — Z79899 Other long term (current) drug therapy: Secondary | ICD-10-CM

## 2018-09-14 DIAGNOSIS — R0603 Acute respiratory distress: Secondary | ICD-10-CM

## 2018-09-14 DIAGNOSIS — R739 Hyperglycemia, unspecified: Secondary | ICD-10-CM | POA: Diagnosis present

## 2018-09-14 DIAGNOSIS — E43 Unspecified severe protein-calorie malnutrition: Secondary | ICD-10-CM | POA: Diagnosis present

## 2018-09-14 DIAGNOSIS — Z4659 Encounter for fitting and adjustment of other gastrointestinal appliance and device: Secondary | ICD-10-CM

## 2018-09-14 DIAGNOSIS — Z885 Allergy status to narcotic agent status: Secondary | ICD-10-CM

## 2018-09-14 DIAGNOSIS — Z66 Do not resuscitate: Secondary | ICD-10-CM | POA: Diagnosis present

## 2018-09-14 DIAGNOSIS — F0280 Dementia in other diseases classified elsewhere without behavioral disturbance: Secondary | ICD-10-CM | POA: Diagnosis present

## 2018-09-14 DIAGNOSIS — M810 Age-related osteoporosis without current pathological fracture: Secondary | ICD-10-CM | POA: Diagnosis present

## 2018-09-14 DIAGNOSIS — J181 Lobar pneumonia, unspecified organism: Secondary | ICD-10-CM | POA: Diagnosis not present

## 2018-09-14 DIAGNOSIS — G301 Alzheimer's disease with late onset: Secondary | ICD-10-CM

## 2018-09-14 DIAGNOSIS — J440 Chronic obstructive pulmonary disease with acute lower respiratory infection: Secondary | ICD-10-CM | POA: Diagnosis present

## 2018-09-14 DIAGNOSIS — I10 Essential (primary) hypertension: Secondary | ICD-10-CM | POA: Diagnosis present

## 2018-09-14 DIAGNOSIS — J159 Unspecified bacterial pneumonia: Secondary | ICD-10-CM | POA: Diagnosis present

## 2018-09-14 DIAGNOSIS — D638 Anemia in other chronic diseases classified elsewhere: Secondary | ICD-10-CM | POA: Diagnosis present

## 2018-09-14 DIAGNOSIS — F0390 Unspecified dementia without behavioral disturbance: Secondary | ICD-10-CM

## 2018-09-14 DIAGNOSIS — R32 Unspecified urinary incontinence: Secondary | ICD-10-CM | POA: Diagnosis present

## 2018-09-14 DIAGNOSIS — Z87892 Personal history of anaphylaxis: Secondary | ICD-10-CM

## 2018-09-14 DIAGNOSIS — Z882 Allergy status to sulfonamides status: Secondary | ICD-10-CM

## 2018-09-14 DIAGNOSIS — I248 Other forms of acute ischemic heart disease: Secondary | ICD-10-CM | POA: Diagnosis present

## 2018-09-14 DIAGNOSIS — I480 Paroxysmal atrial fibrillation: Secondary | ICD-10-CM | POA: Diagnosis present

## 2018-09-14 DIAGNOSIS — Z0189 Encounter for other specified special examinations: Secondary | ICD-10-CM

## 2018-09-14 DIAGNOSIS — J189 Pneumonia, unspecified organism: Secondary | ICD-10-CM

## 2018-09-14 DIAGNOSIS — M1732 Unilateral post-traumatic osteoarthritis, left knee: Secondary | ICD-10-CM | POA: Diagnosis present

## 2018-09-14 DIAGNOSIS — Z7951 Long term (current) use of inhaled steroids: Secondary | ICD-10-CM

## 2018-09-14 DIAGNOSIS — Z6822 Body mass index (BMI) 22.0-22.9, adult: Secondary | ICD-10-CM

## 2018-09-14 DIAGNOSIS — Z888 Allergy status to other drugs, medicaments and biological substances status: Secondary | ICD-10-CM

## 2018-09-14 DIAGNOSIS — G309 Alzheimer's disease, unspecified: Secondary | ICD-10-CM | POA: Diagnosis present

## 2018-09-14 DIAGNOSIS — G9341 Metabolic encephalopathy: Secondary | ICD-10-CM | POA: Diagnosis present

## 2018-09-14 DIAGNOSIS — M25561 Pain in right knee: Secondary | ICD-10-CM | POA: Diagnosis present

## 2018-09-14 DIAGNOSIS — K224 Dyskinesia of esophagus: Secondary | ICD-10-CM | POA: Diagnosis present

## 2018-09-14 DIAGNOSIS — R17 Unspecified jaundice: Secondary | ICD-10-CM | POA: Diagnosis present

## 2018-09-14 DIAGNOSIS — J690 Pneumonitis due to inhalation of food and vomit: Secondary | ICD-10-CM | POA: Diagnosis present

## 2018-09-14 LAB — CBC WITH DIFFERENTIAL/PLATELET
Abs Immature Granulocytes: 0.08 10*3/uL — ABNORMAL HIGH (ref 0.00–0.07)
BASOS PCT: 1 %
Basophils Absolute: 0.1 10*3/uL (ref 0.0–0.1)
EOS ABS: 0 10*3/uL (ref 0.0–0.5)
EOS PCT: 0 %
HCT: 35.5 % — ABNORMAL LOW (ref 36.0–46.0)
Hemoglobin: 11.3 g/dL — ABNORMAL LOW (ref 12.0–15.0)
Immature Granulocytes: 1 %
Lymphocytes Relative: 6 %
Lymphs Abs: 0.7 10*3/uL (ref 0.7–4.0)
MCH: 29.2 pg (ref 26.0–34.0)
MCHC: 31.8 g/dL (ref 30.0–36.0)
MCV: 91.7 fL (ref 80.0–100.0)
MONO ABS: 0.8 10*3/uL (ref 0.1–1.0)
MONOS PCT: 7 %
NEUTROS ABS: 9.7 10*3/uL — AB (ref 1.7–7.7)
Neutrophils Relative %: 85 %
PLATELETS: 321 10*3/uL (ref 150–400)
RBC: 3.87 MIL/uL (ref 3.87–5.11)
RDW: 14.2 % (ref 11.5–15.5)
WBC: 11.3 10*3/uL — ABNORMAL HIGH (ref 4.0–10.5)
nRBC: 0 % (ref 0.0–0.2)

## 2018-09-14 LAB — CG4 I-STAT (LACTIC ACID): Lactic Acid, Venous: 2.17 mmol/L (ref 0.5–1.9)

## 2018-09-14 MED ORDER — IPRATROPIUM-ALBUTEROL 0.5-2.5 (3) MG/3ML IN SOLN
3.0000 mL | Freq: Once | RESPIRATORY_TRACT | Status: AC
  Administered 2018-09-14: 3 mL via RESPIRATORY_TRACT
  Filled 2018-09-14: qty 3

## 2018-09-14 MED ORDER — SODIUM CHLORIDE 0.9 % IV SOLN
500.0000 mg | Freq: Once | INTRAVENOUS | Status: AC
  Administered 2018-09-14: 500 mg via INTRAVENOUS
  Filled 2018-09-14: qty 500

## 2018-09-14 MED ORDER — SODIUM CHLORIDE 0.9 % IV SOLN: 1.0000 g | Freq: Once | INTRAVENOUS | Status: DC

## 2018-09-14 MED ORDER — ACETAMINOPHEN 10 MG/ML IV SOLN
1000.0000 mg | Freq: Once | INTRAVENOUS | Status: AC
  Administered 2018-09-15: 1000 mg via INTRAVENOUS
  Filled 2018-09-14: qty 100

## 2018-09-14 MED ORDER — SODIUM CHLORIDE 0.9 % IV BOLUS
1000.0000 mL | Freq: Once | INTRAVENOUS | Status: AC
  Administered 2018-09-14: 1000 mL via INTRAVENOUS

## 2018-09-14 MED ORDER — SODIUM CHLORIDE 0.9 % IV BOLUS
1000.0000 mL | Freq: Once | INTRAVENOUS | Status: AC
  Administered 2018-09-15: 1000 mL via INTRAVENOUS

## 2018-09-14 MED ORDER — METHYLPREDNISOLONE SODIUM SUCC 125 MG IJ SOLR
80.0000 mg | Freq: Once | INTRAMUSCULAR | Status: AC
  Administered 2018-09-14: 80 mg via INTRAVENOUS
  Filled 2018-09-14: qty 2

## 2018-09-14 NOTE — ED Triage Notes (Addendum)
Pt arrives to ED via POV from home with c/o fever, loss of appetite and urinary incontinence x2 days. Daughter-in-law reports pt seen recently for same and d/x'd with UTI. Fever at home of 101.7, no antipyretics given PTA.

## 2018-09-14 NOTE — ED Provider Notes (Addendum)
[facility] Emergency Department Provider Note  ____________________________________________   First MD Initiated Contact with Patient 09/14/18 2310     (approximate)  I have reviewed the triage vital signs and the nursing notes.   HISTORY  Chief Complaint Fever  Level 5 exemption history limited by the patient's critical illness  HPI Janice Hoffman is a 85 y.o. female who is brought to the emergency department by her daughter with 2days of lethargy, fever, decreased appetite, and urinary incontinence.  The patient has a longstanding history of COPD although does not use oxygen at home.  She was admitted to our hospital 2 months ago for urosepsis and on chart review this was pansensitive E. coli.  The patient symptoms have been gradual onset slowly progressive are now severe.  She is confused.  She does have a productive cough although according to the daughter this cough is been similar for the past "40 or 50 years".  Past Medical History:  Diagnosis Date  . Asthma   . COPD (chronic obstructive pulmonary disease) ([place])   . Herpes zoster   . Hypertension   . Neuralgia     Patient Active Problem List   Diagnosis Date Noted  . Sepsis ([place]) 07/18/2018  . Acute exacerbation of chronic obstructive pulmonary disease (COPD) ([place]) 03/14/2018  . Acute on chronic respiratory failure with hypoxemia ([place]) 02/22/2018  . CAP (community acquired pneumonia) 08/18/2017  . New onset a-fib ([place]) 06/15/2017  . Near syncope 05/31/2017  . COPD with acute exacerbation ([place]) 01/05/2017  . Essential hypertension, malignant 01/05/2017  . Dysphagia 01/05/2017  . COPD exacerbation ([place]) 03/15/2016  . Osteoarthritis 10/12/2014  . Osteoporosis, post-menopausal 10/12/2014  . Pernicious anemia 06/07/2014  . Vitamin D deficiency 06/07/2014  . Post-traumatic osteoarthritis of left knee 05/24/2014  . Pain in joint, lower leg 08/03/2013  . 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  ELEVATED BLOOD PRESSURE 05/12/2008  . HERPES ZOSTER 04/26/2008  . B12 DEFICIENCY 04/26/2008  . CONSTIPATION 04/26/2008  . LEG EDEMA, BILATERAL 04/26/2008    Past Surgical History:  Procedure Laterality Date  . LEG SURGERY      Prior to Admission medications   Medication Sig Start Date End Date Taking? Authorizing Provider  acetaminophen (TYLENOL) 650 MG CR tablet Take 1,300 mg by mouth 2 (two) times daily.    Yes [provider]  cyanocobalamin (,VITAMIN B-12,) 1000 MCG/ML injection Inject 1,000 mcg into the skin every 30 (thirty) days.    Yes [provider]  Fluticasone-Salmeterol (ADVAIR) 500-50 MCG/DOSE AEPB Inhale 1 puff into the lungs 2 (two) times daily.   Yes [provider]  metoprolol tartrate (LOPRESSOR) 25 MG tablet Take 1 tablet (25 mg total) by mouth 2 (two) times daily. 08/20/17  Yes Gladstone Lighter, MD  polyethylene glycol (MIRALAX / GLYCOLAX) packet Take 17 g by mouth daily as needed.   Yes [provider]  tiotropium (SPIRIVA HANDIHALER) 18 MCG inhalation capsule Place 1 capsule (18 mcg total) into inhaler and inhale daily. 02/23/18 09/14/18 Yes Sainani, Belia Heman, MD  traMADol (ULTRAM) 50 MG tablet Take 50 mg by mouth 2 (two) times daily.    Yes [provider]  acetaminophen (TYLENOL) 650 MG CR tablet Take 650 mg by mouth every 8 (eight) hours as needed for pain.    [provider]  feeding supplement, ENSURE ENLIVE, (ENSURE ENLIVE) LIQD Take 237 mLs by mouth 2 (two) times daily between meals. 03/18/16   [name], MD  lisinopril (PRINIVIL,ZESTRIL) 5  MG tablet Take 1 tablet (5 mg total) by mouth daily. 02/23/18 04/24/18  Henreitta Leber, MD    Allergies Aspirin; Nsaids; Sulfa antibiotics; Codeine; Ciprofloxacin; Penicillins; Phenol; and Promethazine hcl  Family History  Problem Relation Age of Onset  . COPD Sister     Social History Social History   Tobacco Use  . Smoking status: Never Smoker  . Smokeless  tobacco: Never Used  Substance Use Topics  . Alcohol use: No    Alcohol/week: 0.0 standard drinks  . Drug use: No    Review of Systems Constitutional: Positive for fevers and chills Eyes: No visual changes. ENT: No sore throat. Cardiovascular: Denies chest pain. Respiratory: Positive for shortness of breath. Gastrointestinal: No abdominal pain.  Positive for nausea, no vomiting.  No diarrhea.  No constipation. Genitourinary: Positive for urinary incontinence. Musculoskeletal: Negative for back pain. Skin: Negative for rash. Neurological: Negative for headaches, focal weakness or numbness.   ____________________________________________   PHYSICAL EXAM:  VITAL SIGNS: ED Triage Vitals  Enc Vitals Group     BP 09/14/18 2251 (!) 189/94     Pulse Rate 09/14/18 2251 (!) 108     Resp 09/14/18 2251 20     Temp 09/14/18 2251 (!) 101.8 F (38.8 C)     Temp Source 09/14/18 2251 Oral     SpO2 09/14/18 2251 91 %     Weight 09/14/18 2254 115 lb (52.2 kg)     Height 09/14/18 2254 5' 6"  (1.676 m)     Head Circumference --      Peak Flow --      Pain Score 09/14/18 2253 7     Pain Loc --      Pain Edu? --      Excl. in [place]? --     Constitutional: Appears obviously uncomfortable with elevated respiratory rate and wet sounding cough speaking in short sentences Eyes: PERRL EOMI. Head: Atraumatic. Nose: No congestion/rhinnorhea. Mouth/Throat: No trismus Neck: No meningismus.  Sounds as if she may be aspirating Cardiovascular: Irregularly irregular and tachycardic Respiratory: Increased respiratory effort with rhonchi at bilateral bases and slight expiratory wheeze throughout Gastrointestinal: Soft nontender Musculoskeletal: No lower extremity edema   Neurologic:   No gross focal neurologic deficits are appreciated. Skin:  Skin is warm, dry and intact. No rash noted. Psychiatric: Obtunded    ____________________________________________   DIFFERENTIAL includes but not limited  to  Sepsis, bacteremia, urinary tract infection, pneumonia, influenza, COPD exacerbation ____________________________________________   LABS (all labs ordered are listed, but only abnormal results are displayed)  Labs Reviewed  COMPREHENSIVE METABOLIC PANEL - Abnormal; Notable for the following components:      Result Value   Glucose, Bld 115 (*)    Calcium 8.8 (*)    Total Bilirubin 1.4 (*)    All other components within normal limits  CBC WITH DIFFERENTIAL/PLATELET - Abnormal; Notable for the following components:   WBC 11.3 (*)    Hemoglobin 11.3 (*)    HCT 35.5 (*)    Neutro Abs 9.7 (*)    Abs Immature Granulocytes 0.08 (*)    All other components within normal limits  URINALYSIS, COMPLETE (UACMP) WITH MICROSCOPIC - Abnormal; Notable for the following components:   Color, Urine YELLOW (*)    APPearance CLEAR (*)    Hgb urine dipstick SMALL (*)    Ketones, ur 5 (*)    Protein, ur 100 (*)    Bacteria, UA RARE (*)    All other components within normal  limits  BLOOD GAS, VENOUS - Abnormal; Notable for the following components:   pH, Ven 7.46 (*)    pCO2, Ven 43 (*)    pO2, Ven <31.0 (*)    Bicarbonate 30.6 (*)    Acid-Base Excess 6.0 (*)    All other components within normal limits  TROPONIN I - Abnormal; Notable for the following components:   Troponin I 0.04 (*)    All other components within normal limits  BLOOD GAS, ARTERIAL - Abnormal; Notable for the following components:   pH, Arterial 7.31 (*)    Acid-base deficit 2.5 (*)    All other components within normal limits  CG4 I-STAT (LACTIC ACID) - Abnormal; Notable for the following components:   Lactic Acid, Venous 2.17 (*)    All other components within normal limits  CULTURE, BLOOD (ROUTINE X 2)  CULTURE, BLOOD (ROUTINE X 2)  URINE CULTURE  PROTIME-INR  INFLUENZA PANEL BY PCR (TYPE A & B)  LIPASE, BLOOD  PROCALCITONIN  LACTIC ACID, PLASMA  I-STAT CG4 LACTIC ACID, ED  I-STAT CG4 LACTIC ACID, ED    Lab  work reviewed by me shows elevated lactic acid concerning for sepsis __________________________________________  EKG  ED ECG REPORT I, Darel Hong, the attending physician, personally viewed and interpreted this ECG.  Date: 09/14/2018 EKG Time:  Rate: 108 Rhythm: Atrial fibrillation with rapid ventricular response QRS Axis: Leftward Intervals: normal ST/T Wave abnormalities: normal Narrative Interpretation: no evidence of acute ischemia.  Morphology unchanged when compared from previous EKG 07/18/2018  ____________________________________________  RADIOLOGY  Chest x-ray reviewed by me with bilateral infiltrates concerning for pneumonia ____________________________________________   PROCEDURES  Procedure(s) performed: Yes  .Critical Care Performed by: Darel Hong, MD Authorized by: Darel Hong, MD   Critical care provider statement:    Critical care time (minutes):  50   Critical care time was exclusive of:  Separately billable procedures and treating other patients   Critical care was necessary to treat or prevent imminent or life-threatening deterioration of the following conditions:  Respiratory failure and sepsis   Critical care was time spent personally by me on the following activities:  Development of treatment plan with patient or surrogate, discussions with consultants, evaluation of patient's response to treatment, examination of patient, obtaining history from patient or surrogate, ordering and performing treatments and interventions, ordering and review of laboratory studies, ordering and review of radiographic studies, pulse oximetry, re-evaluation of patient's condition and review of old charts Procedure Name: Intubation Date/Time: 09/15/2018 3:11 AM Performed by: Darel Hong, MD Pre-anesthesia Checklist: Patient identified, Patient being monitored, Emergency Drugs available, Timeout performed and Suction available Oxygen Delivery Method:  Non-rebreather mask Preoxygenation: Pre-oxygenation with 100% oxygen Induction Type: Rapid sequence Ventilation: Mask ventilation without difficulty Laryngoscope Size: Mac and 3 Grade View: Grade I Tube size: 7.5 mm Number of attempts: 1 Placement Confirmation: ETT inserted through vocal cords under direct vision,  CO2 detector and Breath sounds checked- equal and bilateral Secured at: 21 cm Tube secured with: ETT holder Comments: Copious thick secretions had to be suctioned during intubation    OG placement Date/Time: 09/15/2018 3:12 AM Performed by: Darel Hong, MD Authorized by: Darel Hong, MD  Consent: The procedure was performed in an emergent situation. Patient tolerance: Patient tolerated the procedure well with no immediate complications    Angiocath insertion Performed by: Darel Hong  Consent: Verbal consent obtained. Risks and benefits: risks, benefits and alternatives were discussed Time out: Immediately prior to procedure a "time out" was called  to verify the correct patient, procedure, equipment, support staff and site/side marked as required.  Preparation: Patient was prepped and draped in the usual sterile fashion.  Vein Location: Right upper extremity  Ultrasound Guided  Gauge: 16  Normal blood return and flush without difficulty Patient tolerance: Patient tolerated the procedure well with no immediate complications.     Critical Care performed: Yes  ____________________________________________   INITIAL IMPRESSION / ASSESSMENT AND PLAN / ED COURSE  Pertinent labs & imaging results that were available during my care of the patient were reviewed by me and considered in my medical decision making (see chart for details).   As part of my medical decision making, I reviewed the following data within the [place] History obtained from family if available, nursing notes, old chart and ekg, as well as notes from prior ED  visits.  The patient comes to the emergency department critically ill-appearing with an elevated respiratory rate, coarse breath sounds, and febrile.  She also has altered mental status and appears to be aspirating.  Family is primarily concerned about a possible urinary tract infection however chest x-ray performed today is consistent with bilateral pneumonia.  She had a recent hospitalization and appears critically ill and I am concerned that she could have Pseudomonas colonization so we will cover her with cefepime and azithromycin in addition to fluid resuscitation and broad labs including cultures.  The patient's respiratory status continued to rapidly decline and she began breathing around 30 times a minute despite duo nebs and was very uncomfortable.  She was altered and had gurgling breath sounds so I discussed with family at bedside and on the phone who is the power of attorney and together we all agreed she would benefit from endotracheal intubation.  She was intubated without complication and initially was slightly difficult to sedate and did require transient use of levo fed.  At this time at 3:15 AM she is on dexmedetomidine and fentanyl infusions cefepime fentanyl and is stable on the ventilator.  Care has been transitioned to the hospitalist who has graciously agreed to admit the patient to his service.     ----------------------------------------- 4:28 AM on 09/15/2018 -----------------------------------------  The patient's lactic acid is clearing nicely.  She continues to await an ICU bed. ____________________________________________   FINAL CLINICAL IMPRESSION(S) / ED DIAGNOSES  Final diagnoses:  Sepsis, due to unspecified organism, unspecified whether acute organ dysfunction present ([place])  Pneumonia of both lower lobes due to infectious organism [facility])      NEW MEDICATIONS STARTED DURING THIS VISIT:  New Prescriptions   No medications on file     Note:  This  document was prepared using Dragon voice recognition software and may include unintentional dictation errors.    Darel Hong, MD 09/15/18 8280    Darel Hong, MD 09/15/18 [phone]

## 2018-09-15 ENCOUNTER — Inpatient Hospital Stay: Payer: Medicare Other

## 2018-09-15 ENCOUNTER — Emergency Department: Payer: Medicare Other

## 2018-09-15 ENCOUNTER — Other Ambulatory Visit: Payer: Self-pay

## 2018-09-15 ENCOUNTER — Encounter: Payer: Self-pay | Admitting: Internal Medicine

## 2018-09-15 DIAGNOSIS — J9601 Acute respiratory failure with hypoxia: Secondary | ICD-10-CM | POA: Diagnosis not present

## 2018-09-15 DIAGNOSIS — M1732 Unilateral post-traumatic osteoarthritis, left knee: Secondary | ICD-10-CM | POA: Diagnosis present

## 2018-09-15 DIAGNOSIS — J440 Chronic obstructive pulmonary disease with acute lower respiratory infection: Secondary | ICD-10-CM | POA: Diagnosis present

## 2018-09-15 DIAGNOSIS — J9621 Acute and chronic respiratory failure with hypoxia: Secondary | ICD-10-CM | POA: Diagnosis present

## 2018-09-15 DIAGNOSIS — G309 Alzheimer's disease, unspecified: Secondary | ICD-10-CM | POA: Diagnosis present

## 2018-09-15 DIAGNOSIS — I480 Paroxysmal atrial fibrillation: Secondary | ICD-10-CM | POA: Diagnosis present

## 2018-09-15 DIAGNOSIS — D638 Anemia in other chronic diseases classified elsewhere: Secondary | ICD-10-CM | POA: Diagnosis present

## 2018-09-15 DIAGNOSIS — J189 Pneumonia, unspecified organism: Secondary | ICD-10-CM

## 2018-09-15 DIAGNOSIS — M810 Age-related osteoporosis without current pathological fracture: Secondary | ICD-10-CM | POA: Diagnosis present

## 2018-09-15 DIAGNOSIS — F0280 Dementia in other diseases classified elsewhere without behavioral disturbance: Secondary | ICD-10-CM | POA: Diagnosis present

## 2018-09-15 DIAGNOSIS — Z66 Do not resuscitate: Secondary | ICD-10-CM | POA: Diagnosis present

## 2018-09-15 DIAGNOSIS — A419 Sepsis, unspecified organism: Secondary | ICD-10-CM | POA: Diagnosis present

## 2018-09-15 DIAGNOSIS — G8929 Other chronic pain: Secondary | ICD-10-CM | POA: Diagnosis present

## 2018-09-15 DIAGNOSIS — Y95 Nosocomial condition: Secondary | ICD-10-CM | POA: Diagnosis present

## 2018-09-15 DIAGNOSIS — Z515 Encounter for palliative care: Secondary | ICD-10-CM | POA: Diagnosis not present

## 2018-09-15 DIAGNOSIS — G9341 Metabolic encephalopathy: Secondary | ICD-10-CM | POA: Diagnosis present

## 2018-09-15 DIAGNOSIS — Z7189 Other specified counseling: Secondary | ICD-10-CM | POA: Diagnosis not present

## 2018-09-15 DIAGNOSIS — I10 Essential (primary) hypertension: Secondary | ICD-10-CM | POA: Diagnosis present

## 2018-09-15 DIAGNOSIS — R0603 Acute respiratory distress: Secondary | ICD-10-CM | POA: Diagnosis not present

## 2018-09-15 DIAGNOSIS — I248 Other forms of acute ischemic heart disease: Secondary | ICD-10-CM | POA: Diagnosis present

## 2018-09-15 DIAGNOSIS — R627 Adult failure to thrive: Secondary | ICD-10-CM | POA: Diagnosis present

## 2018-09-15 DIAGNOSIS — J181 Lobar pneumonia, unspecified organism: Secondary | ICD-10-CM | POA: Diagnosis present

## 2018-09-15 DIAGNOSIS — E43 Unspecified severe protein-calorie malnutrition: Secondary | ICD-10-CM | POA: Diagnosis present

## 2018-09-15 DIAGNOSIS — R32 Unspecified urinary incontinence: Secondary | ICD-10-CM | POA: Diagnosis present

## 2018-09-15 DIAGNOSIS — J690 Pneumonitis due to inhalation of food and vomit: Secondary | ICD-10-CM

## 2018-09-15 DIAGNOSIS — J159 Unspecified bacterial pneumonia: Secondary | ICD-10-CM | POA: Diagnosis present

## 2018-09-15 DIAGNOSIS — K224 Dyskinesia of esophagus: Secondary | ICD-10-CM | POA: Diagnosis present

## 2018-09-15 DIAGNOSIS — F0390 Unspecified dementia without behavioral disturbance: Secondary | ICD-10-CM | POA: Diagnosis not present

## 2018-09-15 DIAGNOSIS — R17 Unspecified jaundice: Secondary | ICD-10-CM | POA: Diagnosis present

## 2018-09-15 DIAGNOSIS — R739 Hyperglycemia, unspecified: Secondary | ICD-10-CM | POA: Diagnosis present

## 2018-09-15 LAB — COMPREHENSIVE METABOLIC PANEL
ALT: 14 U/L (ref 0–44)
AST: 23 U/L (ref 15–41)
Albumin: 3.5 g/dL (ref 3.5–5.0)
Alkaline Phosphatase: 73 U/L (ref 38–126)
Anion gap: 11 (ref 5–15)
BILIRUBIN TOTAL: 1.4 mg/dL — AB (ref 0.3–1.2)
BUN: 19 mg/dL (ref 8–23)
CHLORIDE: 98 mmol/L (ref 98–111)
CO2: 26 mmol/L (ref 22–32)
CREATININE: 0.51 mg/dL (ref 0.44–1.00)
Calcium: 8.8 mg/dL — ABNORMAL LOW (ref 8.9–10.3)
GFR calc Af Amer: >60 mL/min (ref >60)
Glucose, Bld: 115 mg/dL — ABNORMAL HIGH (ref 70–99)
Potassium: 3.9 mmol/L (ref 3.5–5.1)
Sodium: 135 mmol/L (ref 135–145)
TOTAL PROTEIN: 7.5 g/dL (ref 6.5–8.1)

## 2018-09-15 LAB — URINALYSIS, COMPLETE (UACMP) WITH MICROSCOPIC
Bilirubin Urine: NEGATIVE
GLUCOSE, UA: NEGATIVE mg/dL
Ketones, ur: 5 mg/dL — AB
Leukocytes, UA: NEGATIVE
NITRITE: NEGATIVE
PH: 5 (ref 5.0–8.0)
Protein, ur: 100 mg/dL — AB
SPECIFIC GRAVITY, URINE: 1.021 (ref 1.005–1.030)

## 2018-09-15 LAB — LACTIC ACID, PLASMA: Lactic Acid, Venous: 1.4 mmol/L (ref 0.5–1.9)

## 2018-09-15 LAB — BLOOD GAS, ARTERIAL
Acid-base deficit: 2.5 mmol/L — ABNORMAL HIGH (ref 0.0–2.0)
Bicarbonate: 24.2 mmol/L (ref 20.0–28.0)
FIO2: 0.3
MECHVT: 350 mL
Mechanical Rate: 20
O2 Saturation: 95.5 %
PCO2 ART: 48 mmHg (ref 32.0–48.0)
PEEP: 5 cmH2O
Patient temperature: 37
pH, Arterial: 7.31 — ABNORMAL LOW (ref 7.350–7.450)
pO2, Arterial: 86 mmHg (ref 83.0–108.0)

## 2018-09-15 LAB — INFLUENZA PANEL BY PCR (TYPE A & B)
INFLBPCR: NEGATIVE
Influenza A By PCR: NEGATIVE

## 2018-09-15 LAB — TROPONIN I: Troponin I: 0.03 ng/mL (ref <0.03)

## 2018-09-15 LAB — BLOOD GAS, VENOUS
ACID-BASE EXCESS: 6 mmol/L — AB (ref 0.0–2.0)
BICARBONATE: 30.6 mmol/L — AB (ref 20.0–28.0)
O2 SAT: 52.6 %
PATIENT TEMPERATURE: 37
pCO2, Ven: 43 mmHg — ABNORMAL LOW (ref 44.0–60.0)
pH, Ven: 7.46 — ABNORMAL HIGH (ref 7.250–7.430)

## 2018-09-15 LAB — LIPASE, BLOOD: Lipase: 22 U/L (ref 11–51)

## 2018-09-15 LAB — MAGNESIUM: MAGNESIUM: 1.9 mg/dL (ref 1.7–2.4)

## 2018-09-15 LAB — PROTIME-INR
INR: 1.05
PROTHROMBIN TIME: 13.6 s (ref 11.4–15.2)

## 2018-09-15 LAB — STREP PNEUMONIAE URINARY ANTIGEN: Strep Pneumo Urinary Antigen: NEGATIVE

## 2018-09-15 LAB — PHOSPHORUS: PHOSPHORUS: 3.7 mg/dL (ref 2.5–4.6)

## 2018-09-15 LAB — GLUCOSE, CAPILLARY: Glucose-Capillary: 167 mg/dL — ABNORMAL HIGH (ref 70–99)

## 2018-09-15 LAB — PROCALCITONIN: Procalcitonin: 0.22 ng/mL

## 2018-09-15 LAB — MRSA PCR SCREENING: MRSA BY PCR: NEGATIVE

## 2018-09-15 MED ORDER — KETAMINE HCL 10 MG/ML IJ SOLN: 100.0000 mg | Freq: Once | INTRAMUSCULAR | Status: DC

## 2018-09-15 MED ORDER — SUCCINYLCHOLINE CHLORIDE 20 MG/ML IJ SOLN: 100.0000 mg | Freq: Once | INTRAMUSCULAR | Status: DC

## 2018-09-15 MED ORDER — SUCCINYLCHOLINE CHLORIDE 20 MG/ML IJ SOLN
INTRAMUSCULAR | Status: AC | PRN
  Administered 2018-09-15: 100 mg via INTRAVENOUS

## 2018-09-15 MED ORDER — SODIUM CHLORIDE 0.9 % IV SOLN
2.0000 g | Freq: Two times a day (BID) | INTRAVENOUS | Status: DC
  Filled 2018-09-15 (×2): qty 2

## 2018-09-15 MED ORDER — POLYETHYLENE GLYCOL 3350 17 G PO PACK: 17.0000 g | PACK | Freq: Every day | ORAL | Status: DC | PRN

## 2018-09-15 MED ORDER — NOREPINEPHRINE BITARTRATE 1 MG/ML IV SOLN
0.0000 ug/min | INTRAVENOUS | Status: DC
  Administered 2018-09-15: 5 ug/min via INTRAVENOUS
  Filled 2018-09-15: qty 4

## 2018-09-15 MED ORDER — FENTANYL CITRATE (PF) 100 MCG/2ML IJ SOLN: 150.0000 ug | Freq: Once | INTRAMUSCULAR | Status: DC

## 2018-09-15 MED ORDER — VANCOMYCIN HCL IN DEXTROSE 750-5 MG/150ML-% IV SOLN: 750.0000 mg | Freq: Once | INTRAVENOUS | Status: DC

## 2018-09-15 MED ORDER — ENOXAPARIN SODIUM 40 MG/0.4ML ~~LOC~~ SOLN
40.0000 mg | SUBCUTANEOUS | Status: DC
  Administered 2018-09-15 – 2018-09-21 (×7): 40 mg via SUBCUTANEOUS
  Filled 2018-09-15 (×7): qty 0.4

## 2018-09-15 MED ORDER — FENTANYL CITRATE (PF) 100 MCG/2ML IJ SOLN
INTRAMUSCULAR | Status: AC | PRN
  Administered 2018-09-15: 150 ug via INTRAVENOUS

## 2018-09-15 MED ORDER — FENTANYL 2500MCG IN NS 250ML (10MCG/ML) PREMIX INFUSION
0.0000 ug/h | INTRAVENOUS | Status: DC
  Administered 2018-09-15: 25 ug/h via INTRAVENOUS
  Administered 2018-09-15: 100 ug/h via INTRAVENOUS
  Filled 2018-09-15 (×2): qty 250

## 2018-09-15 MED ORDER — VANCOMYCIN HCL IN DEXTROSE 1-5 GM/200ML-% IV SOLN
1000.0000 mg | Freq: Once | INTRAVENOUS | Status: AC
  Administered 2018-09-15: 1000 mg via INTRAVENOUS
  Filled 2018-09-15: qty 200

## 2018-09-15 MED ORDER — METHYLPREDNISOLONE SODIUM SUCC 40 MG IJ SOLR
40.0000 mg | Freq: Two times a day (BID) | INTRAMUSCULAR | Status: DC
  Administered 2018-09-15 – 2018-09-18 (×6): 40 mg via INTRAVENOUS
  Filled 2018-09-15 (×6): qty 1

## 2018-09-15 MED ORDER — ETOMIDATE 2 MG/ML IV SOLN
20.0000 mg | Freq: Once | INTRAVENOUS | Status: AC
  Administered 2018-09-15: 20 mg via INTRAVENOUS

## 2018-09-15 MED ORDER — ENOXAPARIN SODIUM 40 MG/0.4ML ~~LOC~~ SOLN: 30.0000 mg | SUBCUTANEOUS | Status: DC

## 2018-09-15 MED ORDER — ORAL CARE MOUTH RINSE
15.0000 mL | OROMUCOSAL | Status: DC
  Administered 2018-09-15 – 2018-09-17 (×16): 15 mL via OROMUCOSAL

## 2018-09-15 MED ORDER — SODIUM CHLORIDE 0.9 % IV SOLN
100.0000 mg | Freq: Two times a day (BID) | INTRAVENOUS | Status: DC
  Administered 2018-09-15: 100 mg via INTRAVENOUS
  Filled 2018-09-15 (×2): qty 100

## 2018-09-15 MED ORDER — BUDESONIDE 0.5 MG/2ML IN SUSP
0.5000 mg | Freq: Two times a day (BID) | RESPIRATORY_TRACT | Status: DC
  Administered 2018-09-15 – 2018-09-22 (×14): 0.5 mg via RESPIRATORY_TRACT
  Filled 2018-09-15 (×16): qty 2

## 2018-09-15 MED ORDER — LORAZEPAM 2 MG/ML IJ SOLN
4.0000 mg | Freq: Once | INTRAMUSCULAR | Status: AC
  Administered 2018-09-15: 4 mg via INTRAVENOUS
  Filled 2018-09-15: qty 2

## 2018-09-15 MED ORDER — SENNOSIDES 8.8 MG/5ML PO SYRP
5.0000 mL | ORAL_SOLUTION | Freq: Two times a day (BID) | ORAL | Status: DC
  Administered 2018-09-15 (×2): 5 mL
  Filled 2018-09-15 (×4): qty 5

## 2018-09-15 MED ORDER — CHLORHEXIDINE GLUCONATE 0.12% ORAL RINSE (MEDLINE KIT)
15.0000 mL | Freq: Two times a day (BID) | OROMUCOSAL | Status: DC
  Administered 2018-09-15 – 2018-09-17 (×5): 15 mL via OROMUCOSAL

## 2018-09-15 MED ORDER — CLINDAMYCIN PHOSPHATE 600 MG/50ML IV SOLN
600.0000 mg | Freq: Three times a day (TID) | INTRAVENOUS | Status: DC
  Administered 2018-09-15 – 2018-09-16 (×4): 600 mg via INTRAVENOUS
  Filled 2018-09-15 (×6): qty 50

## 2018-09-15 MED ORDER — METOPROLOL TARTRATE 25 MG PO TABS: 25.0000 mg | ORAL_TABLET | Freq: Two times a day (BID) | ORAL | Status: DC

## 2018-09-15 MED ORDER — LACTATED RINGERS IV SOLN
INTRAVENOUS | Status: AC
  Administered 2018-09-15: 06:00:00 via INTRAVENOUS

## 2018-09-15 MED ORDER — KETAMINE HCL 10 MG/ML IJ SOLN
INTRAMUSCULAR | Status: AC | PRN
  Administered 2018-09-15: 100 mg via INTRAVENOUS

## 2018-09-15 MED ORDER — FENTANYL CITRATE (PF) 100 MCG/2ML IJ SOLN
200.0000 ug | Freq: Once | INTRAMUSCULAR | Status: AC
  Administered 2018-09-15: 200 ug via INTRAVENOUS

## 2018-09-15 MED ORDER — IPRATROPIUM-ALBUTEROL 0.5-2.5 (3) MG/3ML IN SOLN
3.0000 mL | RESPIRATORY_TRACT | Status: DC
  Administered 2018-09-15 – 2018-09-16 (×9): 3 mL via RESPIRATORY_TRACT
  Filled 2018-09-15 (×10): qty 3

## 2018-09-15 MED ORDER — DEXMEDETOMIDINE HCL IN NACL 400 MCG/100ML IV SOLN
0.4000 ug/kg/h | INTRAVENOUS | Status: DC
  Administered 2018-09-15 (×3): 0.4 ug/kg/h via INTRAVENOUS
  Administered 2018-09-16: 0.8 ug/kg/h via INTRAVENOUS
  Filled 2018-09-15 (×3): qty 100

## 2018-09-15 MED ORDER — SODIUM CHLORIDE 0.9 % IV SOLN
2.0000 g | Freq: Once | INTRAVENOUS | Status: AC
  Administered 2018-09-15: 2 g via INTRAVENOUS
  Filled 2018-09-15: qty 2

## 2018-09-15 MED ORDER — FAMOTIDINE IN NACL 20-0.9 MG/50ML-% IV SOLN: 20.0000 mg | INTRAVENOUS | Status: DC

## 2018-09-15 MED ORDER — BISACODYL 10 MG RE SUPP
10.0000 mg | Freq: Once | RECTAL | Status: AC
  Administered 2018-09-15: 10 mg via RECTAL
  Filled 2018-09-15: qty 1

## 2018-09-15 MED ORDER — PANTOPRAZOLE SODIUM 40 MG PO PACK
40.0000 mg | PACK | Freq: Every day | ORAL | Status: DC
  Administered 2018-09-15 – 2018-09-16 (×2): 40 mg
  Filled 2018-09-15 (×2): qty 20

## 2018-09-15 NOTE — Care Management Note (Signed)
Case Management Note  Patient Details  Name: MERINA BEHRENDT MRN: [id number] Date of Birth: 1926-02-26  Subjective/Objective:    Patient admitted for sepsis due to pneumonia.  Daughter in law is at the bedside patient is currently intubated and sedated.  Daughter in law reports that the patient lives with her and her husband, the patient's son.  Son and daughter in law provide care for the patient, report that she is only left for 3 hours in the midmorning while both are at work but she has a Med alert and a telephone.  Patient is mostly independent, daughter in law reports that she gets up and dresses herself every morning and she does have some incontinence issues but the patient is vigilant about cleaning herself well if she has an accident.  Patient has a walker, cane, wheelchair, shower bench, and bedside commode.  Daughter in law reports that the patient loves to read and will spend much of her day reading.  Daughter in law or her husband prepare the meals and they provide transportation.  Daughter in law reports that a DSS worker has been out to evaluate the home and they were told there was no better place the patient could be.  PCP is Dr. Edwina Barth follows up every 6 months, pharmacy is [facility] on [address]    RNCM will cont to follow patient progress and assess for discharge needs. Doran Clay RN BSN [phone]              Action/Plan:   Expected Discharge Date:                  Expected Discharge Plan:  [place]  In-House Referral:     Discharge planning Services  CM Consult  Post Acute Care Choice:    Choice offered to:     DME Arranged:    DME Agency:     HH Arranged:    [place] Agency:  [place]  Status of Service:  In process, will continue to follow  If discussed at Long Length of Stay Meetings, dates discussed:    Additional Comments:  Shelbie Hutching, RN 09/15/2018, 2:25 PM

## 2018-09-15 NOTE — Progress Notes (Signed)
Patient transported to CCU on transport ventilator.  Patient tolerated well.

## 2018-09-15 NOTE — Progress Notes (Signed)
CODE SEPSIS - PHARMACY COMMUNICATION  **Broad Spectrum Antibiotics should be administered within 1 hour of Sepsis diagnosis**  Time Code Sepsis Called/Page Received: 0106 2319  Antibiotics Ordered: 0106 2332  Time of 1st antibiotic administration: 0106 2351  Additional action taken by pharmacy:   If necessary, Name of Provider/Nurse Contacted:     Eloise Harman ,PharmD Clinical Pharmacist  09/15/2018  1:35 AM

## 2018-09-15 NOTE — Consult Note (Signed)
Name: Janice Hoffman MRN: [id number] DOB: [birth year]    ADMISSION DATE:  09/14/2018 CONSULTATION DATE: 09/15/2017  REFERRING MD : Dr. Jodell Cipro   CHIEF COMPLAINT: Fever   BRIEF PATIENT DESCRIPTION:  85 yo female admitted with sepsis and acute on chronic hypoxic respiratory failure secondary to bilateral pneumonia and aspiration requiring mechanical intubation   SIGNIFICANT EVENTS  01/7-Pt admitted to ICU mechanically intubated   HISTORY OF PRESENT ILLNESS:   This is a 85 yo female with a PMH of Neuralgia, HTN, Herpes Zoster, COPD, and Asthma.  She presented to [facility] ER on 01/6 from home with lethargy, fever, decreased appetite, and urinary incontinence.  According to pts daughter pt also has a chronic productive cough.  She was discharged on 07/20/2018 following treatment of E coli urosepsis.  Per ER notes since discharge pts condition gradually declined.  Pts family concerned she may have a recurrent UTI. In the ER UA negative and influenza pcr negative however, CXR concerning for bilateral pneumonia.  Due to continued decline in pts respiratory status despite duoneb treatment and concerns of possible aspiration pt required mechanical intubation.  Lab results revealed troponin 0.04, lactic acid 2.17, pct 0.22, wbc 11.3, and hgb 11.3.  Pt tachycardic and febrile ruling pt in for sepsis, she received iv vancomycin and cefepime.  She developed hypotension requiring levophed gtt.  She was subsequently admitted to ICU by hospitalist team for additional workup and treatment.   PAST MEDICAL HISTORY :   has a past medical history of Asthma, COPD (chronic obstructive pulmonary disease) ([place]), Herpes zoster, Hypertension, and Neuralgia.  has a past surgical history that includes Leg Surgery. Prior to Admission medications   Medication Sig Start Date End Date Taking? Authorizing Provider  acetaminophen (TYLENOL) 650 MG CR tablet Take 1,300 mg by mouth 2 (two) times daily.    Yes [provider]   cyanocobalamin (,VITAMIN B-12,) 1000 MCG/ML injection Inject 1,000 mcg into the skin every 30 (thirty) days.    Yes [provider]  Fluticasone-Salmeterol (ADVAIR) 500-50 MCG/DOSE AEPB Inhale 1 puff into the lungs 2 (two) times daily.   Yes [provider]  metoprolol tartrate (LOPRESSOR) 25 MG tablet Take 1 tablet (25 mg total) by mouth 2 (two) times daily. 08/20/17  Yes Gladstone Lighter, MD  polyethylene glycol (MIRALAX / GLYCOLAX) packet Take 17 g by mouth daily as needed.   Yes [provider]  tiotropium (SPIRIVA HANDIHALER) 18 MCG inhalation capsule Place 1 capsule (18 mcg total) into inhaler and inhale daily. 02/23/18 09/14/18 Yes Sainani, Belia Heman, MD  traMADol (ULTRAM) 50 MG tablet Take 50 mg by mouth 2 (two) times daily.    Yes [provider]  acetaminophen (TYLENOL) 650 MG CR tablet Take 650 mg by mouth every 8 (eight) hours as needed for pain.    [provider]  feeding supplement, ENSURE ENLIVE, (ENSURE ENLIVE) LIQD Take 237 mLs by mouth 2 (two) times daily between meals. 03/18/16   [name], MD  lisinopril (PRINIVIL,ZESTRIL) 5 MG tablet Take 1 tablet (5 mg total) by mouth daily. 02/23/18 04/24/18  Henreitta Leber, MD   Allergies  Allergen Reactions  . Aspirin Shortness Of Breath  . Nsaids Shortness Of Breath and Anaphylaxis    Throat swelling, shortness of breath  . Sulfa Antibiotics Other (See Comments)    Stomach pain Stomach pain   . Codeine Other (See Comments)    Short of breath   . Ciprofloxacin Hives  . Penicillins  Has patient had a PCN reaction causing immediate rash, facial/tongue/throat swelling, SOB or lightheadedness with hypotension: Unknown Has patient had a PCN reaction causing severe rash involving mucus membranes or skin necrosis: Unknown Has patient had a PCN reaction that required hospitalization: Unknown Has patient had a PCN reaction occurring within the last 10 years: Unknown If all of the above  answers are "NO", then may proceed with Cephalosporin use.   Marland Kitchen Phenol     Other reaction(s): Angioedema (ALLERGY/intolerance), Other (See Comments) Spasms, nerve symptoms Spasms, nerve symptoms   . Promethazine Hcl     FAMILY HISTORY:  family history includes COPD in her sister. SOCIAL HISTORY:  reports that she has never smoked. She has never used smokeless tobacco. She reports that she does not drink alcohol or use drugs.  REVIEW OF SYSTEMS:   Unable to assess pt intubated   SUBJECTIVE:  Unable to assess pt intubated   VITAL SIGNS: Temp:  [98.9 F (37.2 C)-101.8 F (38.8 C)] 98.9 F (37.2 C) (01/07 0500) Pulse Rate:  [28-178] 70 (01/07 0500) Resp:  [0-40] 22 (01/07 0500) BP: (73-212)/(46-133) 141/79 (01/07 0500) SpO2:  [87 %-100 %] 98 % (01/07 0500) FiO2 (%):  [30 %] 30 % (01/07 0439) Weight:  [52.2 kg] 52.2 kg (01/06 2254)  PHYSICAL EXAMINATION: General: acutely ill appearing female, NAD mechanically intubated  Neuro: sedated, not following commands, PERRL  HEENT: supple, no JVD  Cardiovascular: nsr, rrr, no R/G Lungs: diminished throughout, even, non labored  Abdomen: +BS x4, soft, non tender, non distended  Musculoskeletal: normal bulk and tone, no edema  Skin: intact no rashes or lesions present   Recent Labs  Lab 09/14/18 2329  NA 135  K 3.9  CL 98  CO2 26  BUN 19  CREATININE 0.51  GLUCOSE 115*   Recent Labs  Lab 09/14/18 2329  HGB 11.3*  HCT 35.5*  WBC 11.3*  PLT 321   Dg Chest 2 View  Result Date: 09/14/2018 CLINICAL DATA:  Fever, possible sepsis EXAM: CHEST - 2 VIEW COMPARISON:  07/18/2018 FINDINGS: Bibasilar infiltrates and small pleural effusions. Stable cardiomediastinal silhouette with aortic atherosclerosis. No pneumothorax. Scoliosis and degenerative changes of the spine. IMPRESSION: Small pleural effusions with bibasilar infiltrates. Electronically Signed   By: Donavan Foil M.D.   On: 09/14/2018 23:26   Dg Chest Port 1 View  Result  Date: 09/15/2018 CLINICAL DATA:  Intubation and OG tube placement. Radiographs yesterday. EXAM: PORTABLE CHEST 1 VIEW COMPARISON:  Frontal and lateral views yesterday. FINDINGS: Endotracheal tube tip 4.7 cm from the carina. Enteric tube is in place, tip in the distal esophagus, advancement of least 10 cm recommend. Bibasilar opacities and left pleural effusion are unchanged from prior exam allowing for differences in technique. Unchanged heart size and mediastinal contours with aortic atherosclerosis. There is mild interstitial coarsening. No pneumothorax. Scoliotic curvature of spine. IMPRESSION: 1. Endotracheal tube 4.7 cm from the carina. Enteric tube in place, tip in the distal esophagus, advancement of least 10 cm recommended. 2. Unchanged bibasilar opacities and small left pleural effusion. Unchanged interstitial coarsening. Electronically Signed   By: Keith Rake M.D.   On: 09/15/2018 01:19    ASSESSMENT / PLAN:  Acute on chronic hypoxic respiratory failure secondary to bilateral pneumonia and aspiration Mechanical intubation  Hx: COPD and Asthma  Full vent support-vent settings reviewed and established SBT once all parameters met  VAP bundle implemented  Scheduled and prn bronchodilator therapy IV and nebulized steroids   Hypotension secondary to sepsis  Elevated troponin likely secondary to demand ischemia in setting of respiratory failure  Trend troponin  Continuous telemetry monitoring  Prn levophed gtt to maintain map  LR '@75'  ml/hr  Hold outpatient antihypertensives for now   Bilateral Pneumonia  Trend WBC and monitor fever curve  Trend PCT and lactic acid Follow cultures Continue vancomycin and cefepime if MRSA PCR negative will discontinue vancomycin  Anemia without obvious acute blood loss VTE px: subq lovenox  Trend CBC  Monitor for s/sx of bleeding and transfuse for hgb <7  Mechanical intubation discomfort/pain Maintain RASS goal 0 to -1 Precedex and fentanyl  gtts to maintain RASS goal WUA daily    Marda Stalker, [place] Pager [phone] (please enter 7 digits) PCCM Consult Pager [phone] (please enter 7 digits)

## 2018-09-15 NOTE — ED Notes (Signed)
Report called and given to receiving RN, who came down to the department to get pt, transferred with respiratory therapist and one other assist.

## 2018-09-15 NOTE — H&P (Signed)
[facility] NAME: Janice Hoffman    MR#:  [id number]  DATE OF BIRTH:  04/28/1926  DATE OF ADMISSION:  09/14/2018  PRIMARY CARE PHYSICIAN: Baxter Hire, MD   REQUESTING/REFERRING PHYSICIAN: Darel Hong, MD  CHIEF COMPLAINT:   Chief Complaint  Patient presents with  . Fever    HISTORY OF PRESENT ILLNESS:  Janice Hoffman  is a 85 y.o. female with a known history of COPD, HTN p/w AMS, fever, acute hypoxemic respiratory failure, acute bacterial healthcare acquired pneumonia, aspiration pneumonia, sepsis. Pt intubated in ED prior to my assessment. Admitted to [facility] ~22mo ago w/ UTI + sepsis. I believe she presently lives at home w/ her daughter. I am told she is very sharp at baseline, but does require a walker to ambulate. Per family, pt has had poor appetite for 3d. She was febrile (T 101.7) on Monday 09/14/2018 PM. Her daughter thought she might have recurrent UTI, and brought her to the hospital. SIRS (+). CXR (+) "Small pleural effusions with bibasilar infiltrates." Lactate 2.17, decreased to 1.4. PCT 0.22.  PAST MEDICAL HISTORY:   Past Medical History:  Diagnosis Date  . Asthma   . COPD (chronic obstructive pulmonary disease) ([place])   . Herpes zoster   . Hypertension   . Neuralgia     PAST SURGICAL HISTORY:   Past Surgical History:  Procedure Laterality Date  . LEG SURGERY      SOCIAL HISTORY:   Social History   Tobacco Use  . Smoking status: Never Smoker  . Smokeless tobacco: Never Used  Substance Use Topics  . Alcohol use: No    Alcohol/week: 0.0 standard drinks    FAMILY HISTORY:   Family History  Problem Relation Age of Onset  . COPD Sister     DRUG ALLERGIES:   Allergies  Allergen Reactions  . Aspirin Shortness Of Breath  . Nsaids Shortness Of Breath and Anaphylaxis    Throat swelling, shortness of breath  . Sulfa Antibiotics Other (See Comments)    Stomach pain Stomach pain   . Codeine  Other (See Comments)    Short of breath   . Ciprofloxacin Hives  . Penicillins     Has patient had a PCN reaction causing immediate rash, facial/tongue/throat swelling, SOB or lightheadedness with hypotension: Unknown Has patient had a PCN reaction causing severe rash involving mucus membranes or skin necrosis: Unknown Has patient had a PCN reaction that required hospitalization: Unknown Has patient had a PCN reaction occurring within the last 10 years: Unknown If all of the above answers are "NO", then may proceed with Cephalosporin use.   Marland Kitchen Phenol     Other reaction(s): Angioedema (ALLERGY/intolerance), Other (See Comments) Spasms, nerve symptoms Spasms, nerve symptoms   . Promethazine Hcl     REVIEW OF SYSTEMS:   Review of Systems  Unable to perform ROS: Intubated  Constitutional: Positive for fever.   MEDICATIONS AT HOME:   Prior to Admission medications   Medication Sig Start Date End Date Taking? Authorizing Provider  acetaminophen (TYLENOL) 650 MG CR tablet Take 1,300 mg by mouth 2 (two) times daily.    Yes [provider]  cyanocobalamin (,VITAMIN B-12,) 1000 MCG/ML injection Inject 1,000 mcg into the skin every 30 (thirty) days.    Yes [provider]  Fluticasone-Salmeterol (ADVAIR) 500-50 MCG/DOSE AEPB Inhale 1 puff into the lungs 2 (two) times daily.   Yes [provider]  metoprolol tartrate (LOPRESSOR) 25  MG tablet Take 1 tablet (25 mg total) by mouth 2 (two) times daily. 08/20/17  Yes Gladstone Lighter, MD  polyethylene glycol (MIRALAX / GLYCOLAX) packet Take 17 g by mouth daily as needed.   Yes [provider]  tiotropium (SPIRIVA HANDIHALER) 18 MCG inhalation capsule Place 1 capsule (18 mcg total) into inhaler and inhale daily. 02/23/18 09/14/18 Yes Sainani, Belia Heman, MD  traMADol (ULTRAM) 50 MG tablet Take 50 mg by mouth 2 (two) times daily.    Yes [provider]  acetaminophen (TYLENOL) 650 MG CR tablet Take 650 mg by  mouth every 8 (eight) hours as needed for pain.    [provider]  feeding supplement, ENSURE ENLIVE, (ENSURE ENLIVE) LIQD Take 237 mLs by mouth 2 (two) times daily between meals. 03/18/16   [name], MD  lisinopril (PRINIVIL,ZESTRIL) 5 MG tablet Take 1 tablet (5 mg total) by mouth daily. 02/23/18 04/24/18  Henreitta Leber, MD      VITAL SIGNS:  Blood pressure 108/63, pulse 62, temperature (!) 97 F (36.1 C), resp. rate (!) 22, height 5\' 4"  (1.626 m), weight 57.1 kg, SpO2 100 %.  PHYSICAL EXAMINATION:  Physical Exam Constitutional:      Appearance: She is ill-appearing and toxic-appearing.  HENT:     Head: Atraumatic.  Eyes:     General: No scleral icterus.    Conjunctiva/sclera: Conjunctivae normal.  Neck:     Musculoskeletal: Neck supple.  Cardiovascular:     Rate and Rhythm: Normal rate and regular rhythm.     Heart sounds: Normal heart sounds. No murmur. No friction rub. No gallop.   Pulmonary:     Breath sounds: No stridor. Rhonchi present. No wheezing or rales.  Abdominal:     General: There is no distension.     Palpations: Abdomen is soft.     Tenderness: There is no abdominal tenderness. There is no guarding or rebound.  Musculoskeletal:     Right lower leg: No edema.     Left lower leg: No edema.  Lymphadenopathy:     Cervical: No cervical adenopathy.  Skin:    General: Skin is warm and dry.     Findings: No erythema or rash.  Neurological:     Comments: Intubated/sedated.  Psychiatric:        Attention and Perception: She is inattentive.        Speech: She is noncommunicative.     Comments: Intubated/sedated.    LABORATORY PANEL:   CBC Recent Labs  Lab 09/14/18 2329  WBC 11.3*  HGB 11.3*  HCT 35.5*  PLT 321   ------------------------------------------------------------------------------------------------------------------  Chemistries  Recent Labs  Lab 09/14/18 2329 09/15/18 0623  NA 135  --   K 3.9  --   CL 98  --   CO2 26  --    GLUCOSE 115*  --   BUN 19  --   CREATININE 0.51  --   CALCIUM 8.8*  --   MG  --  1.9  AST 23  --   ALT 14  --   ALKPHOS 73  --   BILITOT 1.4*  --    ------------------------------------------------------------------------------------------------------------------  Cardiac Enzymes Recent Labs  Lab 09/15/18 0623  TROPONINI 0.03*   ------------------------------------------------------------------------------------------------------------------  RADIOLOGY:  Dg Chest 2 View  Result Date: 09/14/2018 CLINICAL DATA:  Fever, possible sepsis EXAM: CHEST - 2 VIEW COMPARISON:  07/18/2018 FINDINGS: Bibasilar infiltrates and small pleural effusions. Stable cardiomediastinal silhouette with aortic atherosclerosis. No pneumothorax. Scoliosis and degenerative  changes of the spine. IMPRESSION: Small pleural effusions with bibasilar infiltrates. Electronically Signed   By: Donavan Foil M.D.   On: 09/14/2018 23:26   Dg Chest Port 1 View  Result Date: 09/15/2018 CLINICAL DATA:  Intubation and OG tube placement. Radiographs yesterday. EXAM: PORTABLE CHEST 1 VIEW COMPARISON:  Frontal and lateral views yesterday. FINDINGS: Endotracheal tube tip 4.7 cm from the carina. Enteric tube is in place, tip in the distal esophagus, advancement of least 10 cm recommend. Bibasilar opacities and left pleural effusion are unchanged from prior exam allowing for differences in technique. Unchanged heart size and mediastinal contours with aortic atherosclerosis. There is mild interstitial coarsening. No pneumothorax. Scoliotic curvature of spine. IMPRESSION: 1. Endotracheal tube 4.7 cm from the carina. Enteric tube in place, tip in the distal esophagus, advancement of least 10 cm recommended. 2. Unchanged bibasilar opacities and small left pleural effusion. Unchanged interstitial coarsening. Electronically Signed   By: Keith Rake M.D.   On: 09/15/2018 01:19   IMPRESSION AND PLAN:   A/P: 46F w/ PMHx COPD, HTN and  recent admit for UTI/sepsis p/w AMS, fever, acute hypoxemic respiratory failure, acute bacterial healthcare acquired pneumonia, aspiration pneumonia, sepsis. Hyperglycemia, hypocalcemia, mild hyperbilirubinemia, Troponin elevation, leukocytosis, normocytic anemia. -AMS, fever, acute hypoxemic respiratory failure, acute bacterial healthcare acquired pneumonia, aspiration pneumonia, sepsis, leukocytosis, hyperglycemia: SIRS (+). CXR (+) "Small pleural effusions with bibasilar infiltrates." Lactate 2.17, decreased to 1.4. PCT 0.22. Cefepime + Vanc + Doxy (coverage for CAP + HCAP + aspiration). Flu (-), Strep/Leg pending. Nebs (mobilization of secretions). O2. -Troponin elevation: Isolate 0.04. Demand. -Hypocalcemia, mild hyperbilirubinemia, normocytic anemia: IVF. Ionized calcium. Likely anemia of chronic disease. -c/w home meds/formulary subs. -FEN/GI: NPO. -DVT PPx: Lovenox. -Code status: Full code. -Disposition: Admission, > 2 midnights.   All the records are reviewed and case discussed with ED provider. Management plans discussed with the patient, family and they are in agreement.  CODE STATUS: Full code.  TOTAL TIME TAKING CARE OF THIS PATIENT: 75 minutes.    Arta Silence M.D on 09/15/2018 at 9:35 AM  Between 7am to 6pm - Pager - [phone]  After 6pm go to [email or web address] - Proofreader  [facility]  Office  [phone]  CC: Primary care physician; Baxter Hire, MD   Note: This dictation was prepared with Dragon dictation along with smaller phrase technology. Any transcriptional errors that result from this process are unintentional.

## 2018-09-15 NOTE — ED Notes (Signed)
O2 @ 2L/MIN STARTED

## 2018-09-15 NOTE — Progress Notes (Signed)
Pharmacy Antibiotic Note  Janice Hoffman is a 85 y.o. female admitted on 09/14/2018 with HCAP.  Pharmacy has been consulted for cefepime dosing.  Plan: Cefepime 2 grams q 12 hours ordered  Height: 5\' 6"  (167.6 cm) Weight: 115 lb (52.2 kg) IBW/kg (Calculated) : 59.3  Temp (24hrs), Avg:100.8 F (38.2 C), Min:99.4 F (37.4 C), Max:101.8 F (38.8 C)  Recent Labs  Lab 09/14/18 2306 09/14/18 2329  WBC  --  11.3*  CREATININE  --  0.51  LATICACIDVEN 2.17*  --     Estimated Creatinine Clearance: 38.5 mL/min (by C-G formula based on SCr of 0.51 mg/dL).    Allergies  Allergen Reactions  . Aspirin Shortness Of Breath  . Nsaids Shortness Of Breath and Anaphylaxis    Throat swelling, shortness of breath  . Sulfa Antibiotics Other (See Comments)    Stomach pain Stomach pain   . Codeine Other (See Comments)    Short of breath   . Ciprofloxacin Hives  . Penicillins     Has patient had a PCN reaction causing immediate rash, facial/tongue/throat swelling, SOB or lightheadedness with hypotension: Unknown Has patient had a PCN reaction causing severe rash involving mucus membranes or skin necrosis: Unknown Has patient had a PCN reaction that required hospitalization: Unknown Has patient had a PCN reaction occurring within the last 10 years: Unknown If all of the above answers are "NO", then may proceed with Cephalosporin use.   Marland Kitchen Phenol     Other reaction(s): Angioedema (ALLERGY/intolerance), Other (See Comments) Spasms, nerve symptoms Spasms, nerve symptoms   . Promethazine Hcl     Antimicrobials this admission: Azithromycin x1, cefepime 1/7  >>    >>   Dose adjustments this admission:   Microbiology results: 1/6 BCx: pending 1/6 UCx: pending       1/7 CXR: bibasilar opacities 1/6 UA: pending Thank you for allowing pharmacy to be a part of this patient's care.  Janice Hoffman S 09/15/2018 1:38 AM

## 2018-09-16 ENCOUNTER — Inpatient Hospital Stay: Payer: Medicare Other

## 2018-09-16 DIAGNOSIS — E43 Unspecified severe protein-calorie malnutrition: Secondary | ICD-10-CM

## 2018-09-16 LAB — GLUCOSE, CAPILLARY
Glucose-Capillary: 104 mg/dL — ABNORMAL HIGH (ref 70–99)
Glucose-Capillary: 104 mg/dL — ABNORMAL HIGH (ref 70–99)
Glucose-Capillary: 108 mg/dL — ABNORMAL HIGH (ref 70–99)

## 2018-09-16 LAB — HIV ANTIBODY (ROUTINE TESTING W REFLEX): HIV Screen 4th Generation wRfx: NONREACTIVE

## 2018-09-16 LAB — CBC
HCT: 31.3 % — ABNORMAL LOW (ref 36.0–46.0)
Hemoglobin: 10 g/dL — ABNORMAL LOW (ref 12.0–15.0)
MCH: 29.4 pg (ref 26.0–34.0)
MCHC: 31.9 g/dL (ref 30.0–36.0)
MCV: 92.1 fL (ref 80.0–100.0)
PLATELETS: 246 10*3/uL (ref 150–400)
RBC: 3.4 MIL/uL — ABNORMAL LOW (ref 3.87–5.11)
RDW: 14.6 % (ref 11.5–15.5)
WBC: 6.1 10*3/uL (ref 4.0–10.5)
nRBC: 0 % (ref 0.0–0.2)

## 2018-09-16 LAB — BASIC METABOLIC PANEL
Anion gap: 10 (ref 5–15)
BUN: 29 mg/dL — ABNORMAL HIGH (ref 8–23)
CO2: 23 mmol/L (ref 22–32)
Calcium: 8.9 mg/dL (ref 8.9–10.3)
Chloride: 103 mmol/L (ref 98–111)
Creatinine, Ser: 0.71 mg/dL (ref 0.44–1.00)
GFR calc Af Amer: >60 mL/min (ref >60)
GFR calc non Af Amer: >60 mL/min (ref >60)
Glucose, Bld: 168 mg/dL — ABNORMAL HIGH (ref 70–99)
Potassium: 3.9 mmol/L (ref 3.5–5.1)
SODIUM: 136 mmol/L (ref 135–145)

## 2018-09-16 LAB — CALCIUM, IONIZED: Calcium, Ionized, Serum: 4.8 mg/dL (ref 4.5–5.6)

## 2018-09-16 MED ORDER — METOPROLOL TARTRATE 5 MG/5ML IV SOLN
2.5000 mg | Freq: Once | INTRAVENOUS | Status: AC
  Administered 2018-09-16: 2.5 mg via INTRAVENOUS

## 2018-09-16 MED ORDER — HYDRALAZINE HCL 20 MG/ML IJ SOLN
10.0000 mg | INTRAMUSCULAR | Status: DC | PRN
  Administered 2018-09-16 – 2018-09-17 (×2): 10 mg via INTRAVENOUS
  Filled 2018-09-16 (×2): qty 1

## 2018-09-16 MED ORDER — ACETAMINOPHEN 325 MG PO TABS
ORAL_TABLET | ORAL | Status: AC
  Administered 2018-09-16: 650 mg via NASOGASTRIC
  Filled 2018-09-16: qty 2

## 2018-09-16 MED ORDER — ACETAMINOPHEN 325 MG PO TABS
650.0000 mg | ORAL_TABLET | Freq: Four times a day (QID) | ORAL | Status: DC | PRN
  Administered 2018-09-16: 650 mg via NASOGASTRIC

## 2018-09-16 MED ORDER — METOPROLOL TARTRATE 25 MG PO TABS
25.0000 mg | ORAL_TABLET | Freq: Two times a day (BID) | ORAL | Status: DC
  Administered 2018-09-16: 25 mg via ORAL
  Filled 2018-09-16 (×2): qty 1

## 2018-09-16 MED ORDER — VITAL HIGH PROTEIN PO LIQD: 1000.0000 mL | ORAL | Status: DC

## 2018-09-16 MED ORDER — METOPROLOL TARTRATE 5 MG/5ML IV SOLN
2.5000 mg | Freq: Four times a day (QID) | INTRAVENOUS | Status: DC | PRN
  Administered 2018-09-16: 5 mg via INTRAVENOUS
  Filled 2018-09-16: qty 5

## 2018-09-16 MED ORDER — VITAL 1.5 CAL PO LIQD
1000.0000 mL | ORAL | Status: DC
  Administered 2018-09-16: 1000 mL

## 2018-09-16 MED ORDER — IPRATROPIUM-ALBUTEROL 0.5-2.5 (3) MG/3ML IN SOLN
3.0000 mL | Freq: Four times a day (QID) | RESPIRATORY_TRACT | Status: DC
  Administered 2018-09-16 – 2018-09-19 (×11): 3 mL via RESPIRATORY_TRACT
  Filled 2018-09-16 (×13): qty 3

## 2018-09-16 MED ORDER — FREE WATER
60.0000 mL | Status: DC
  Administered 2018-09-16: 60 mL

## 2018-09-16 MED ORDER — SENNA 8.6 MG PO TABS
1.0000 | ORAL_TABLET | Freq: Every day | ORAL | Status: DC
  Filled 2018-09-16: qty 1

## 2018-09-16 MED ORDER — PANTOPRAZOLE SODIUM 40 MG PO TBEC: 40.0000 mg | DELAYED_RELEASE_TABLET | Freq: Every day | ORAL | Status: DC

## 2018-09-16 MED ORDER — PANTOPRAZOLE SODIUM 40 MG IV SOLR
40.0000 mg | Freq: Once | INTRAVENOUS | Status: AC
  Administered 2018-09-16: 40 mg via INTRAVENOUS
  Filled 2018-09-16: qty 40

## 2018-09-16 MED ORDER — METOPROLOL TARTRATE 5 MG/5ML IV SOLN
INTRAVENOUS | Status: AC
  Filled 2018-09-16: qty 5

## 2018-09-16 MED ORDER — LORAZEPAM 2 MG/ML IJ SOLN
1.0000 mg | Freq: Once | INTRAMUSCULAR | Status: AC
  Administered 2018-09-16: 1 mg via INTRAVENOUS
  Filled 2018-09-16: qty 1

## 2018-09-16 MED ORDER — METOPROLOL TARTRATE 5 MG/5ML IV SOLN
5.0000 mg | Freq: Once | INTRAVENOUS | Status: AC
  Administered 2018-09-16: 5 mg via INTRAVENOUS

## 2018-09-16 MED ORDER — METOPROLOL TARTRATE 5 MG/5ML IV SOLN
INTRAVENOUS | Status: AC
  Administered 2018-09-16: 2.5 mg via INTRAVENOUS
  Filled 2018-09-16: qty 5

## 2018-09-16 MED ORDER — LACTATED RINGERS IV SOLN
INTRAVENOUS | Status: DC
  Administered 2018-09-16: 15:00:00 via INTRAVENOUS

## 2018-09-16 NOTE — Progress Notes (Signed)
Follow up - Critical Care Medicine Note  Patient Details:    Janice Hoffman is an 85 y.o. female. 85 year old woman with a history of dementia, frail, admitted on 7 January with pneumonia and acute hypoxic respiratory failure.  Pneumonia was due to aspiration as evidenced by suctioning of food particles after intubation.  Lines, Airways, Drains: NG/OG Tube Other (Comment) Right nare Xray Documented cm marking at nare/ corner of mouth 80 cm (Active)     Urethral Catheter ED, RN (Active)  Indication for Insertion or Continuance of Catheter Unstable critical patients (first 24-48 hours) 09/16/2018  7:45 PM  Site Assessment Clean;Intact;Dry 09/16/2018  3:20 PM  Catheter Maintenance Bag below level of bladder;Catheter secured;Drainage bag/tubing not touching floor;Insertion date on drainage bag;No dependent loops;Seal intact;Bag emptied prior to transport 09/16/2018  3:20 PM  Collection Container Standard drainage bag 09/16/2018  3:20 PM  Securement Method Securing device (Describe) 09/16/2018  3:20 PM  Urinary Catheter Interventions Unclamped 09/16/2018  3:20 PM  Output (mL) 150 mL 09/16/2018  5:43 PM    Anti-infectives:  Anti-infectives (From admission, onward)   Start     Dose/Rate Route Frequency Ordered Stop   09/15/18 1800  clindamycin (CLEOCIN) IVPB 600 mg     600 mg 100 mL/hr over 30 Minutes Intravenous Every 8 hours 09/15/18 1103     09/15/18 1300  ceFEPIme (MAXIPIME) 2 g in sodium chloride 0.9 % 100 mL IVPB  Status:  Discontinued     2 g 200 mL/hr over 30 Minutes Intravenous Every 12 hours 09/15/18 0138 09/15/18 1103   09/15/18 0530  vancomycin (VANCOCIN) IVPB 1000 mg/200 mL premix     1,000 mg 200 mL/hr over 60 Minutes Intravenous  Once 09/15/18 0524 09/15/18 1022   09/15/18 0515  vancomycin (VANCOCIN) IVPB 750 mg/150 ml premix  Status:  Discontinued     750 mg 150 mL/hr over 60 Minutes Intravenous  Once 09/15/18 0502 09/15/18 0524   09/15/18 0515  doxycycline (VIBRAMYCIN) 100 mg in  sodium chloride 0.9 % 250 mL IVPB  Status:  Discontinued     100 mg 125 mL/hr over 120 Minutes Intravenous Every 12 hours 09/15/18 0502 09/15/18 1103   09/15/18 0015  ceFEPIme (MAXIPIME) 2 g in sodium chloride 0.9 % 100 mL IVPB     2 g 200 mL/hr over 30 Minutes Intravenous  Once 09/15/18 0014 09/15/18 0131   09/14/18 2345  cefTRIAXone (ROCEPHIN) 1 g in sodium chloride 0.9 % 100 mL IVPB  Status:  Discontinued     1 g 200 mL/hr over 30 Minutes Intravenous  Once 09/14/18 2332 09/14/18 2333   09/14/18 2345  azithromycin (ZITHROMAX) 500 mg in sodium chloride 0.9 % 250 mL IVPB     500 mg 250 mL/hr over 60 Minutes Intravenous  Once 09/14/18 2332 09/15/18 0051      Microbiology: Results for orders placed or performed during the hospital encounter of 09/14/18  Urine culture     Status: Abnormal (Preliminary result)   Collection Time: 09/14/18 11:29 PM  Result Value Ref Range Status   Specimen Description   Final    URINE, RANDOM Performed at [facility], [address], [place], [place] [address]    Special Requests   Final    NONE Performed at [facility], [address], [place],  [address]    Culture 70,000 COLONIES/mL ENTEROCOCCUS FAECALIS (A)  Final   Report Status PENDING  Incomplete  Culture, blood (Routine x 2)  Status: None (Preliminary result)   Collection Time: 09/14/18 11:30 PM  Result Value Ref Range Status   Specimen Description BLOOD LEFT ANTECUBITAL  Final   Special Requests   Final    BOTTLES DRAWN AEROBIC AND ANAEROBIC Blood Culture adequate volume   Culture   Final    NO GROWTH 2 DAYS Performed at [facility], [address] [place], [place] [address]    Report Status PENDING  Incomplete  Culture, blood (Routine x 2)     Status: None (Preliminary result)   Collection Time: 09/14/18 11:30 PM  Result Value Ref Range Status   Specimen Description BLOOD BLOOD RIGHT FOREARM  Final   Special Requests   Final    BOTTLES  DRAWN AEROBIC AND ANAEROBIC Blood Culture adequate volume   Culture   Final    NO GROWTH 2 DAYS Performed at [facility], [address] [place], [place] [address]    Report Status PENDING  Incomplete  MRSA PCR Screening     Status: None   Collection Time: 09/15/18  7:51 AM  Result Value Ref Range Status   MRSA by PCR NEGATIVE NEGATIVE Final    Comment:        The GeneXpert MRSA Assay (FDA approved for NASAL specimens only), is one component of a comprehensive MRSA colonization surveillance program. It is not intended to diagnose MRSA infection nor to guide or monitor treatment for MRSA infections. Performed at [facility], [address] [place], [place] [address]   Culture, respiratory (non-expectorated)     Status: None (Preliminary result)   Collection Time: 09/15/18 12:30 PM  Result Value Ref Range Status   Specimen Description   Final    TRACHEAL ASPIRATE Performed at [facility], [address], [place], [place] [address]    Special Requests   Final    NONE Performed at [facility], [place], [place] [address]    Gram Stain NO WBC SEEN NO ORGANISMS SEEN   Final   Culture   Final    NO GROWTH < 24 HOURS Performed at [facility], [place] [address], [place],  [address]    Report Status PENDING  Incomplete    Best Practice/Protocols:  VTE Prophylaxis: Lovenox (prophylaxtic dose) GI Prophylaxis: Proton Pump Inhibitor Weaning protocol  Events:   Studies: Dg Chest 2 View  Result Date: 09/14/2018 CLINICAL DATA:  Fever, possible sepsis EXAM: CHEST - 2 VIEW COMPARISON:  07/18/2018 FINDINGS: Bibasilar infiltrates and small pleural effusions. Stable cardiomediastinal silhouette with aortic atherosclerosis. No pneumothorax. Scoliosis and degenerative changes of the spine. IMPRESSION: Small pleural effusions with bibasilar infiltrates. Electronically Signed   By: Donavan Foil M.D.   On: 09/14/2018  23:26   Dg Abd 1 View  Result Date: 09/15/2018 CLINICAL DATA:  OG tube placement. EXAM: ABDOMEN - 1 VIEW COMPARISON:  One-view abdomen 12/15/2017 FINDINGS: Side port of an OG tube is in the stomach. The stomach is decompressed. Gas is present in the proximal colon. There is moderate stool throughout the distal colon. There is no obstruction or free air. Levoconvex scoliosis of the lumbar spine is again seen. Atherosclerotic calcifications are present. IMPRESSION: 1. Side port of the OG tube is in the fundus of the stomach. 2. Mild gaseous distention of colon proximally with moderate stool distally. Electronically Signed   By: San Morelle M.D.   On: 09/15/2018 10:33   Dg Chest Port 1 View  Result Date: 09/15/2018 CLINICAL  DATA:  Intubation and OG tube placement. Radiographs yesterday. EXAM: PORTABLE CHEST 1 VIEW COMPARISON:  Frontal and lateral views yesterday. FINDINGS: Endotracheal tube tip 4.7 cm from the carina. Enteric tube is in place, tip in the distal esophagus, advancement of least 10 cm recommend. Bibasilar opacities and left pleural effusion are unchanged from prior exam allowing for differences in technique. Unchanged heart size and mediastinal contours with aortic atherosclerosis. There is mild interstitial coarsening. No pneumothorax. Scoliotic curvature of spine. IMPRESSION: 1. Endotracheal tube 4.7 cm from the carina. Enteric tube in place, tip in the distal esophagus, advancement of least 10 cm recommended. 2. Unchanged bibasilar opacities and small left pleural effusion. Unchanged interstitial coarsening. Electronically Signed   By: Keith Rake M.D.   On: 09/15/2018 01:19   Dg Abd Portable 1v  Result Date: 09/16/2018 CLINICAL DATA:  Nasogastric tube placement. EXAM: PORTABLE ABDOMEN - 1 VIEW COMPARISON:  Radiograph of September 15, 2018. FINDINGS: The bowel gas pattern is normal. Distal tip of feeding tube is seen in expected position of distal stomach. No radio-opaque calculi or  other significant radiographic abnormality are seen. IMPRESSION: Distal tip of feeding tube seen in expected position of distal stomach. No definite evidence of bowel obstruction or ileus. Electronically Signed   By: Marijo Conception, M.D.   On: 09/16/2018 16:19   Dg Abd Portable 1v  Result Date: 09/15/2018 CLINICAL DATA:  Enteric tube adjustment. EXAM: PORTABLE ABDOMEN - 1 VIEW COMPARISON:  Abdominal x-ray from same day at 1322. FINDINGS: There has been slight advancement of the enteric tube, with the distal side port now likely just beyond the gastroesophageal junction. Tip remains in the stomach. The bowel gas pattern is unchanged. Stable bibasilar opacities and small left pleural effusion. Endotracheal tube appropriately positioned with the tip 4.7 cm above the carina. IMPRESSION: 1. Slight advancement of the enteric tube with the distal side port now likely just beyond the gastroesophageal junction. Electronically Signed   By: Titus Dubin M.D.   On: 09/15/2018 14:19   Dg Abd Portable 1v  Result Date: 09/15/2018 CLINICAL DATA:  NG tube placement EXAM: PORTABLE ABDOMEN - 1 VIEW COMPARISON:  KUB from earlier today FINDINGS: The NG tube has not changed significantly in position with the tip in the proximal body of the stomach. The opening along the distal portion of the tube lies near the gastroesophageal junction and advancement would be helpful. The bowel gas pattern is nonspecific. Moderate thoracolumbar scoliosis is again noted with diffuse degenerative change. IMPRESSION: 1. Tip of NG tube in the region of the proximal body of the stomach. 2. Opening of the distal portion of the NG tube is near the GE junction. Recommend advancing the tube. Electronically Signed   By: Ivar Drape M.D.   On: 09/15/2018 13:34   Dg Abd Portable 1v  Result Date: 09/15/2018 CLINICAL DATA:  OG tube placement. EXAM: PORTABLE ABDOMEN - 1 VIEW COMPARISON:  Prior exam same day. FINDINGS: OG tube tip and side hole are again  noted in the stomach. However the proximal portion of the OG 2 is again noted coiled in the esophagus and repositioning should be considered. IMPRESSION: OG tip and side hole are again noted in the stomach. However the proximal portion of the OG to is again noted coiled in the esophagus. Repositioning should be considered. These results will be called to the ordering clinician or representative by the Radiologist Assistant, and communication documented in the PACS or zVision Dashboard. Electronically Signed   By:  [place]   On: 09/15/2018 12:46   Dg Abd Portable 1v  Result Date: 09/15/2018 CLINICAL DATA:  85 year old female. Orogastric tube placement. Subsequent encounter. EXAM: PORTABLE ABDOMEN - 1 VIEW COMPARISON:  09/15/2026 12:03 p.m. FINDINGS: Orogastric tube is curled within the distal esophagus. Side hole just beyond expected level of the gastroesophageal junction with tip gastric fundus level. Appearance without significant change since recent exam. Gas-filled slightly prominent size colon. The possibility of free intraperitoneal air cannot be assessed on a supine view. IMPRESSION: 1. Orogastric tube is curled within the distal esophagus. Side hole just beyond expected level of the gastroesophageal junction with tip gastric fundus level. Appearance without significant change since recent exam. 2. Gas-filled slightly prominent size colon. These results will be called to the ordering clinician or representative by the Radiologist Assistant, and communication documented in the PACS or zVision Dashboard. Electronically Signed   By: Genia Del M.D.   On: 09/15/2018 12:29   Dg Abd Portable 1v  Result Date: 09/15/2018 CLINICAL DATA:  OG tube placement. EXAM: PORTABLE ABDOMEN - 1 VIEW COMPARISON:  09/15/2018. FINDINGS: OG tube noted with coiled loops in the distal esophagus. The tip and side hole of the OG tube are in the stomach. Repositioning with straightening of the OG tube should be considered.  Bibasilar atelectasis/infiltrates and small pleural effusions. IMPRESSION: OG tube noted with coiled loops in the distal esophagus. The tip and side hole of the NG tube are in the stomach. Repositioning with straightening of the OG tube should be considered. These results will be called to the ordering clinician or representative by the Radiologist Assistant, and communication documented in the PACS or zVision Dashboard. Electronically Signed   By: Marcello Moores  Register   On: 09/15/2018 12:23    Consults: Treatment Team:  Arta Silence, MD [facility], [facility], MD   Subjective:    Overnight Issues: Overnight has had no issues.  She awakens this morning with erratic following of commands (patient has underlying dementia).  She has however, good respiratory effort and is tolerating spontaneous breathing trial.  Son is at bedside.  Objective:  Vital signs for last 24 hours: Temp:  [97.9 F (36.6 C)-99.5 F (37.5 C)] 99 F (37.2 C) (01/08 1900) Pulse Rate:  [63-161] 113 (01/08 1900) Resp:  [15-30] 30 (01/08 1900) BP: (115-192)/(61-126) 132/76 (01/08 1900) SpO2:  [88 %-100 %] 100 % (01/08 2025) FiO2 (%):  [30 %] 30 % (01/08 0817) Weight:  [58.2 kg] 58.2 kg (01/08 0500)  Hemodynamic parameters for last 24 hours:    Intake/Output from previous day: 01/07 0701 - 01/08 0700 In: 1401.3 [I.V.:1330.1; IV Piggyback:71.1] Out: 326 [Urine:305; Emesis/NG output:20; Stool:1]  Intake/Output this shift: No intake/output data recorded.  Vent settings for last 24 hours: Vent Mode: PSV FiO2 (%):  [30 %] 30 % Set Rate:  [22 bmp] 22 bmp Vt Set:  [400 mL] 400 mL PEEP:  [5 cmH20] 5 cmH20 Pressure Support:  [5 cmH20] 5 cmH20 Plateau Pressure:  [16 cmH20] 16 cmH20  Physical Exam:  Vital signs as noted above. General: Frail, elderly female, orotracheally intubated on spontaneous breathing trial, tolerating.   No tachypnea. Neuro:  Awake, erratic following of commands, PERRL  HEENT: supple, no JVD   Cardiovascular: RRR, no r, m or g. Lungs: diminished throughout, equal air entry bilaterally, non labored  Abdomen: +BS x4, soft, non tender, non distended  Musculoskeletal:  Age-related sarcopenia, no edema  Skin: intact no rashes or lesions present   Assessment/Plan:  1.  Acute hypoxic respiratory failure due to bilateral pneumonia from aspiration of food, this in the setting of chronic asthma.  The patient meets criteria for extubation today and will proceed with the same.  2.  Hypotension due to volume depletion: Resolved  3.  Esophageal dysmotility: This issue adds complexity to her management and is likely the cause of her aspiration issue.  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  I have discussed the situation with the patient's son and made him aware that there is really no specific treatment for this.  At present we will replace OG tube with Dobbhoff feeding tube after extubation, and proceed with formal speech pathology swallow evaluation.  I suspect the patient will need a modified barium swallow.  4.  Pneumonia due to aspiration, bilateral: On clindamycin.  5.  Other: Multiple issues that make her management more complex include advanced age and frailty, esophageal dysmotility, underlying dementia and possible recurrent aspiration.  Prognosis long-term is poor.  Agree with continuing DNR status.   LOS: 1 day   Additional comments:Multidisciplinary rounds were performed with the ICU team.  Patient's son at bedside, updated.  Critical Care Total Time*: 40 Minutes  C. Derrill Kay, MD [facility]  09/16/2018  *Care during the described time interval was provided by me and/or other providers on the critical care team.  I have reviewed this patient's available data, including medical history, events of note, physical examination and test results as part of my  evaluation.

## 2018-09-16 NOTE — Progress Notes (Signed)
Patient pulled dobhoff out. Per Jearld Adjutant, NP attempt reinsertion. Patient not cooperative and screaming while attempting to reinsert. NP notified and stated to leave dobhoff out at this time. IV medication given, PO held. Patient remains disoriented x3. Will continue to assess. Janice Hoffman

## 2018-09-16 NOTE — Progress Notes (Signed)
[facility] [place] NAME: Janice Hoffman    MR#:  [id number]  DATE OF BIRTH:  02-07-1926  SUBJECTIVE: Admitted for pneumonia, possible aspiration, on full vent support.  Alert this morning, sedation being weaned off.  CHIEF COMPLAINT:   Chief Complaint  Patient presents with  . Fever   Son is at bedside. REVIEW OF SYSTEMS:   ROS Unable to obtain because of intubation  DRUG ALLERGIES:   Allergies  Allergen Reactions  . Aspirin Shortness Of Breath  . Nsaids Shortness Of Breath and Anaphylaxis    Throat swelling, shortness of breath  . Sulfa Antibiotics Other (See Comments)    Stomach pain Stomach pain   . Codeine Other (See Comments)    Short of breath   . Ciprofloxacin Hives  . Penicillins     Has patient had a PCN reaction causing immediate rash, facial/tongue/throat swelling, SOB or lightheadedness with hypotension: Unknown Has patient had a PCN reaction causing severe rash involving mucus membranes or skin necrosis: Unknown Has patient had a PCN reaction that required hospitalization: Unknown Has patient had a PCN reaction occurring within the last 10 years: Unknown If all of the above answers are "NO", then may proceed with Cephalosporin use.   Marland Kitchen Phenol     Other reaction(s): Angioedema (ALLERGY/intolerance), Other (See Comments) Spasms, nerve symptoms Spasms, nerve symptoms   . Promethazine Hcl     VITALS:  Blood pressure 126/62, pulse (!) 102, temperature 98.6 F (37 C), resp. rate 18, height 5\' 4"  (1.626 m), weight 58.2 kg, SpO2 95 %.  PHYSICAL EXAMINATION:  GENERAL:  85 y.o.-year-old patient lying in the bed with no acute distress.  Orally intubated EYES: Pupils equal, round, reactive to light . No scleral icterus. Extraocular muscles intact.  HEENT: Head atraumatic, normocephalic. Oropharynx and nasopharynx clear.  NECK:  Supple, no jugular venous distention. No thyroid enlargement, no tenderness.   LUNGS: Normal breath sounds bilaterally, no wheezing, rales,rhonchi or crepitation. No use of accessory muscles of respiration.  CARDIOVASCULAR: S1, S2 normal. No murmurs, rubs, or gallops.  ABDOMEN: Soft, nontender, nondistended. Bowel sounds present. No organomegaly or mass.  EXTREMITIES: No pedal edema, cyanosis, or clubbing.  NEUROLOGIC: Neuro exam not able to be done because of intubation. PSYCHIATRIC: The patient is able to open the eyes to calling her name. SKIN: No obvious rash, lesion, or ulcer.    LABORATORY PANEL:   CBC Recent Labs  Lab 09/16/18 0633  WBC 6.1  HGB 10.0*  HCT 31.3*  PLT 246   ------------------------------------------------------------------------------------------------------------------  Chemistries  Recent Labs  Lab 09/14/18 2329 09/15/18 0623 09/16/18 0633  NA 135  --  136  K 3.9  --  3.9  CL 98  --  103  CO2 26  --  23  GLUCOSE 115*  --  168*  BUN 19  --  29*  CREATININE 0.51  --  0.71  CALCIUM 8.8*  --  8.9  MG  --  1.9  --   AST 23  --   --   ALT 14  --   --   ALKPHOS 73  --   --   BILITOT 1.4*  --   --    ------------------------------------------------------------------------------------------------------------------  Cardiac Enzymes Recent Labs  Lab 09/15/18 0623  TROPONINI 0.03*   ------------------------------------------------------------------------------------------------------------------  RADIOLOGY:  Dg Chest 2 View  Result Date: 09/14/2018 CLINICAL DATA:  Fever, possible sepsis EXAM: CHEST - 2 VIEW COMPARISON:  07/18/2018 FINDINGS: Bibasilar  infiltrates and small pleural effusions. Stable cardiomediastinal silhouette with aortic atherosclerosis. No pneumothorax. Scoliosis and degenerative changes of the spine. IMPRESSION: Small pleural effusions with bibasilar infiltrates. Electronically Signed   By: Donavan Foil M.D.   On: 09/14/2018 23:26   Dg Abd 1 View  Result Date: 09/15/2018 CLINICAL DATA:  OG tube  placement. EXAM: ABDOMEN - 1 VIEW COMPARISON:  One-view abdomen 12/15/2017 FINDINGS: Side port of an OG tube is in the stomach. The stomach is decompressed. Gas is present in the proximal colon. There is moderate stool throughout the distal colon. There is no obstruction or free air. Levoconvex scoliosis of the lumbar spine is again seen. Atherosclerotic calcifications are present. IMPRESSION: 1. Side port of the OG tube is in the fundus of the stomach. 2. Mild gaseous distention of colon proximally with moderate stool distally. Electronically Signed   By: San Morelle M.D.   On: 09/15/2018 10:33   Dg Chest Port 1 View  Result Date: 09/15/2018 CLINICAL DATA:  Intubation and OG tube placement. Radiographs yesterday. EXAM: PORTABLE CHEST 1 VIEW COMPARISON:  Frontal and lateral views yesterday. FINDINGS: Endotracheal tube tip 4.7 cm from the carina. Enteric tube is in place, tip in the distal esophagus, advancement of least 10 cm recommend. Bibasilar opacities and left pleural effusion are unchanged from prior exam allowing for differences in technique. Unchanged heart size and mediastinal contours with aortic atherosclerosis. There is mild interstitial coarsening. No pneumothorax. Scoliotic curvature of spine. IMPRESSION: 1. Endotracheal tube 4.7 cm from the carina. Enteric tube in place, tip in the distal esophagus, advancement of least 10 cm recommended. 2. Unchanged bibasilar opacities and small left pleural effusion. Unchanged interstitial coarsening. Electronically Signed   By: Keith Rake M.D.   On: 09/15/2018 01:19   Dg Abd Portable 1v  Result Date: 09/15/2018 CLINICAL DATA:  Enteric tube adjustment. EXAM: PORTABLE ABDOMEN - 1 VIEW COMPARISON:  Abdominal x-ray from same day at 1322. FINDINGS: There has been slight advancement of the enteric tube, with the distal side port now likely just beyond the gastroesophageal junction. Tip remains in the stomach. The bowel gas pattern is unchanged.  Stable bibasilar opacities and small left pleural effusion. Endotracheal tube appropriately positioned with the tip 4.7 cm above the carina. IMPRESSION: 1. Slight advancement of the enteric tube with the distal side port now likely just beyond the gastroesophageal junction. Electronically Signed   By: Titus Dubin M.D.   On: 09/15/2018 14:19   Dg Abd Portable 1v  Result Date: 09/15/2018 CLINICAL DATA:  NG tube placement EXAM: PORTABLE ABDOMEN - 1 VIEW COMPARISON:  KUB from earlier today FINDINGS: The NG tube has not changed significantly in position with the tip in the proximal body of the stomach. The opening along the distal portion of the tube lies near the gastroesophageal junction and advancement would be helpful. The bowel gas pattern is nonspecific. Moderate thoracolumbar scoliosis is again noted with diffuse degenerative change. IMPRESSION: 1. Tip of NG tube in the region of the proximal body of the stomach. 2. Opening of the distal portion of the NG tube is near the GE junction. Recommend advancing the tube. Electronically Signed   By: Ivar Drape M.D.   On: 09/15/2018 13:34   Dg Abd Portable 1v  Result Date: 09/15/2018 CLINICAL DATA:  OG tube placement. EXAM: PORTABLE ABDOMEN - 1 VIEW COMPARISON:  Prior exam same day. FINDINGS: OG tube tip and side hole are again noted in the stomach. However the proximal portion of the  OG 2 is again noted coiled in the esophagus and repositioning should be considered. IMPRESSION: OG tip and side hole are again noted in the stomach. However the proximal portion of the OG to is again noted coiled in the esophagus. Repositioning should be considered. These results will be called to the ordering clinician or representative by the Radiologist Assistant, and communication documented in the PACS or zVision Dashboard. Electronically Signed   By: Marcello Moores  Register   On: 09/15/2018 12:46   Dg Abd Portable 1v  Result Date: 09/15/2018 CLINICAL DATA:  85 year old female.  Orogastric tube placement. Subsequent encounter. EXAM: PORTABLE ABDOMEN - 1 VIEW COMPARISON:  09/15/2026 12:03 p.m. FINDINGS: Orogastric tube is curled within the distal esophagus. Side hole just beyond expected level of the gastroesophageal junction with tip gastric fundus level. Appearance without significant change since recent exam. Gas-filled slightly prominent size colon. The possibility of free intraperitoneal air cannot be assessed on a supine view. IMPRESSION: 1. Orogastric tube is curled within the distal esophagus. Side hole just beyond expected level of the gastroesophageal junction with tip gastric fundus level. Appearance without significant change since recent exam. 2. Gas-filled slightly prominent size colon. These results will be called to the ordering clinician or representative by the Radiologist Assistant, and communication documented in the PACS or zVision Dashboard. Electronically Signed   By: Genia Del M.D.   On: 09/15/2018 12:29   Dg Abd Portable 1v  Result Date: 09/15/2018 CLINICAL DATA:  OG tube placement. EXAM: PORTABLE ABDOMEN - 1 VIEW COMPARISON:  09/15/2018. FINDINGS: OG tube noted with coiled loops in the distal esophagus. The tip and side hole of the OG tube are in the stomach. Repositioning with straightening of the OG tube should be considered. Bibasilar atelectasis/infiltrates and small pleural effusions. IMPRESSION: OG tube noted with coiled loops in the distal esophagus. The tip and side hole of the NG tube are in the stomach. Repositioning with straightening of the OG tube should be considered. These results will be called to the ordering clinician or representative by the Radiologist Assistant, and communication documented in the PACS or zVision Dashboard. Electronically Signed   By: Marcello Moores  Register   On: 09/15/2018 12:23    EKG:   Orders placed or performed during the hospital encounter of 09/14/18  . ED EKG 12-Lead  . ED EKG 12-Lead    ASSESSMENT AND PLAN:    #1 acute on chronic respiratory failure with hypoxia secondary to possible aspiration pneumonia, currently on full vent support, IV antibiotics, bronchodilators, IV steroids, patient has no wheezing now, weaning trials today.  #2, hypotension: Improved. 3 bilateral pneumonia, WBC trending down, afebrile.  Follow sputum cultures. 4.  Mild dementia: CODE STATUS DNR Discussed with patient son.  All the records are reviewed and case discussed with Care Management/Social Workerr. Management plans discussed with the patient, family and they are in agreement.  CODE STATUS: DNR  TOTAL TIME TAKING CARE OF THIS PATIENT: 73minutes.   POSSIBLE D/C IN 1-2 DAYS, DEPENDING ON CLINICAL CONDITION.   Epifanio Lesches M.D on 09/16/2018 at 8:00 AM  Between 7am to 6pm - Pager - [phone]  After 6pm go to [email or web address] - password [email or web address] [facility]  Office  [phone]  CC: Primary care physician; Baxter Hire, MD   Note: This dictation was prepared with Dragon dictation along with smaller phrase technology. Any transcriptional errors that result from this process are unintentional.

## 2018-09-16 NOTE — Progress Notes (Signed)
Pt scored a 6 on the RT protocol assessment. Because of this her nebulizer treatments were changed to Q6.

## 2018-09-16 NOTE — Progress Notes (Signed)
SLP Cancellation Note  Patient Details Name: Janice Hoffman MRN: [id number] DOB: 05/01/1926   Cancelled treatment:       Reason Eval/Treat Not Completed: Patient not medically ready;Medical issues which prohibited therapy(chart reviewed; consulted MD/[name]). Pt was being extubated upon entering room. Post extubation, pt was nonverbal and had more of an apparent fixed stare upward despite ongoing verbal/tactile stimulation given by [name]/RT. Upon further chart review, it appears pt may have Esophageal dysmotility - MD confirmed stating food pieces were suctioned from ET, there was much difficulty placing the NG tube d/t coiling in the distal Esophagus, and Son asking if a "Hiatal Hernia could be the problem here" in the room. Unsure of pt's GI history; pt is of advanced age.  Due to pt's presentation at this time and recent extubation, will Hold on performing BSE until tomorrow to allow pt time to stabilize post extubation. Encouraged frequent oral care for hygiene and oral stimulation for swallowing. [name] updated. MD agreed.     Orinda Kenner, MS, CCC-SLP Zhanae Proffit 09/16/2018, 11:45 AM

## 2018-09-16 NOTE — Progress Notes (Signed)
Initial Nutrition Assessment  DOCUMENTATION CODES:   Severe malnutrition in context of chronic illness  INTERVENTION:  Initiate Vital 1.5 Cal at 15 mL/hr and advance by 15 mL/hr every 8 hours to goal rate of 45 mL/hr (1080 mL goal daily volume). Provides 1620 kcal, 73 grams of protein, 821 mL H2O daily.  Goal TF regimen meets 100% RDIs for vitamins/minerals.  Provide free water flush of 60 mL Q4hrs. Provides a total of 1181 mL H2O daily including water in tube feeding.  Monitor magnesium, potassium, and phosphorus daily for at least 3 days, MD to replete as needed, as pt is at risk for refeeding syndrome given severe malnutrition.  NUTRITION DIAGNOSIS:   Severe Malnutrition related to chronic illness(COPD, advanced age) as evidenced by severe fat depletion, moderate-severe muscle depletion.  GOAL:   Patient will meet greater than or equal to 90% of their needs  MONITOR:   Diet advancement, Labs, Weight trends, TF tolerance, I & O's  REASON FOR ASSESSMENT:   Ventilator, Consult Assessment of nutrition requirement/status  ASSESSMENT:   85 year old female with PMHx of COPD, asthma, HTN admitted with acute on chronic hypoxic respiratory failure secondary to aspiration PNA, sepsis. Patient was intubated 1/7 and extubated 1/8.   Assessed patient this morning when she was intubated and sedated. She has since been extubated. Abdomen was soft. She had a medium type 6 BM today. Skin is intact. Per chart patient's weight seems to fluctuate between 50.1-55.3 kg. She is currently 58.2 kg (128.31 lbs), which is likely falsely elevated. Will use weight of 57.1 kg from 1/7 to estimate needs, though this is likely also falsely elevated. Will continue to monitor weight trend.  Enteral Access: [place] sump NGT placed 1/7; terminates in stomach after advancement multiple times yesterday (last abdominal x-ray on 1/7); 80 cm at right nare  Medications reviewed and include: Solu-Medrol 40 mg Q12hrs  IV, Protonix 40 mg daily per tube, sennosides 5 mL BID per tube, clindamycin.  Labs reviewed: BUN 29.  Discussed with RN and on rounds. Plan is to initiate enteral nutrition today. Tomorrow patient will be assessed by SLP to see if she can start on a diet.  NUTRITION - FOCUSED PHYSICAL EXAM:    Most Recent Value  Orbital Region  Severe depletion  Upper Arm Region  Severe depletion  Thoracic and Lumbar Region  Moderate depletion  Buccal Region  Unable to assess  Temple Region  Severe depletion  Clavicle Bone Region  Severe depletion  Clavicle and Acromion Bone Region  Severe depletion  Scapular Bone Region  Unable to assess  Dorsal Hand  Severe depletion  Patellar Region  Moderate depletion  Anterior Thigh Region  Moderate depletion  Posterior Calf Region  Severe depletion  Edema (RD Assessment)  None  Hair  Reviewed  Eyes  Unable to assess  Mouth  Unable to assess  Skin  Reviewed  Nails  Reviewed     Diet Order:   Diet Order            Diet NPO time specified  Diet effective now             EDUCATION NEEDS:   Not appropriate for education at this time  Skin:  Skin Assessment: Reviewed RN Assessment  Last BM:  09/16/2018 - medium type 6  Height:   Ht Readings from Last 1 Encounters:  09/15/18 5\' 4"  (1.626 m)   Weight:   Wt Readings from Last 1 Encounters:  09/16/18 58.2 kg  Ideal Body Weight:  54.5 kg  BMI:  Body mass index is 22.02 kg/m.  Estimated Nutritional Needs:   Kcal:  1425-1715 (25-30 kcal/kg)  Protein:  70-85 grams (1.2-1.5 grams/kg)  Fluid:  1.4 L/day (25 mL/kg)  Willey Blade, MS, RD, LDN Office: [phone] Pager: [phone] After Hours/Weekend Pager: [phone]

## 2018-09-17 ENCOUNTER — Inpatient Hospital Stay: Payer: Medicare Other

## 2018-09-17 DIAGNOSIS — J181 Lobar pneumonia, unspecified organism: Secondary | ICD-10-CM

## 2018-09-17 DIAGNOSIS — Z515 Encounter for palliative care: Secondary | ICD-10-CM

## 2018-09-17 DIAGNOSIS — Z7189 Other specified counseling: Secondary | ICD-10-CM

## 2018-09-17 DIAGNOSIS — J189 Pneumonia, unspecified organism: Secondary | ICD-10-CM

## 2018-09-17 LAB — CULTURE, RESPIRATORY W GRAM STAIN
Culture: NO GROWTH
Gram Stain: NONE SEEN

## 2018-09-17 LAB — URINALYSIS, COMPLETE (UACMP) WITH MICROSCOPIC
Bacteria, UA: NONE SEEN
Bilirubin Urine: NEGATIVE
Glucose, UA: NEGATIVE mg/dL
Hgb urine dipstick: NEGATIVE
Ketones, ur: NEGATIVE mg/dL
Leukocytes, UA: NEGATIVE
Nitrite: NEGATIVE
Protein, ur: NEGATIVE mg/dL
Specific Gravity, Urine: 1.006 (ref 1.005–1.030)
Squamous Epithelial / HPF: NONE SEEN (ref 0–5)
pH: 5 (ref 5.0–8.0)

## 2018-09-17 LAB — URINE CULTURE

## 2018-09-17 LAB — BASIC METABOLIC PANEL
Anion gap: 11 (ref 5–15)
BUN: 31 mg/dL — ABNORMAL HIGH (ref 8–23)
CO2: 24 mmol/L (ref 22–32)
Calcium: 9 mg/dL (ref 8.9–10.3)
Chloride: 103 mmol/L (ref 98–111)
Creatinine, Ser: 0.69 mg/dL (ref 0.44–1.00)
GFR calc non Af Amer: >60 mL/min (ref >60)
Glucose, Bld: 112 mg/dL — ABNORMAL HIGH (ref 70–99)
Potassium: 4.4 mmol/L (ref 3.5–5.1)
SODIUM: 138 mmol/L (ref 135–145)

## 2018-09-17 LAB — CBC
HCT: 34.5 % — ABNORMAL LOW (ref 36.0–46.0)
Hemoglobin: 11.3 g/dL — ABNORMAL LOW (ref 12.0–15.0)
MCH: 29.2 pg (ref 26.0–34.0)
MCHC: 32.8 g/dL (ref 30.0–36.0)
MCV: 89.1 fL (ref 80.0–100.0)
Platelets: 427 10*3/uL — ABNORMAL HIGH (ref 150–400)
RBC: 3.87 MIL/uL (ref 3.87–5.11)
RDW: 14.8 % (ref 11.5–15.5)
WBC: 15.7 10*3/uL — ABNORMAL HIGH (ref 4.0–10.5)
nRBC: 0 % (ref 0.0–0.2)

## 2018-09-17 LAB — GLUCOSE, CAPILLARY
Glucose-Capillary: 104 mg/dL — ABNORMAL HIGH (ref 70–99)
Glucose-Capillary: 104 mg/dL — ABNORMAL HIGH (ref 70–99)

## 2018-09-17 LAB — TROPONIN I
TROPONIN I: 0.03 ng/mL — AB (ref <0.03)
TROPONIN I: 0.08 ng/mL — AB (ref <0.03)
Troponin I: 0.04 ng/mL (ref <0.03)

## 2018-09-17 LAB — LEGIONELLA PNEUMOPHILA SEROGP 1 UR AG: L. pneumophila Serogp 1 Ur Ag: NEGATIVE

## 2018-09-17 LAB — MAGNESIUM: Magnesium: 1.9 mg/dL (ref 1.7–2.4)

## 2018-09-17 LAB — PHOSPHORUS: PHOSPHORUS: 4 mg/dL (ref 2.5–4.6)

## 2018-09-17 MED ORDER — FUROSEMIDE 10 MG/ML IJ SOLN
INTRAMUSCULAR | Status: AC
  Filled 2018-09-17: qty 2

## 2018-09-17 MED ORDER — METOPROLOL TARTRATE 5 MG/5ML IV SOLN
5.0000 mg | Freq: Four times a day (QID) | INTRAVENOUS | Status: DC
  Administered 2018-09-17 – 2018-09-18 (×6): 5 mg via INTRAVENOUS
  Filled 2018-09-17 (×5): qty 5

## 2018-09-17 MED ORDER — HYDRALAZINE HCL 20 MG/ML IJ SOLN
INTRAMUSCULAR | Status: AC
  Filled 2018-09-17: qty 1

## 2018-09-17 MED ORDER — AMIODARONE IV BOLUS ONLY 150 MG/100ML
INTRAVENOUS | Status: AC
  Filled 2018-09-17: qty 100

## 2018-09-17 MED ORDER — DEXMEDETOMIDINE HCL IN NACL 400 MCG/100ML IV SOLN: 0.4000 ug/kg/h | INTRAVENOUS | Status: DC

## 2018-09-17 MED ORDER — AMIODARONE IV BOLUS ONLY 150 MG/100ML
150.0000 mg | Freq: Once | INTRAVENOUS | Status: AC
  Administered 2018-09-17: 150 mg via INTRAVENOUS

## 2018-09-17 MED ORDER — ACETAMINOPHEN 160 MG/5ML PO SOLN
500.0000 mg | Freq: Four times a day (QID) | ORAL | Status: DC | PRN
  Filled 2018-09-17: qty 20.3

## 2018-09-17 MED ORDER — HALOPERIDOL LACTATE 5 MG/ML IJ SOLN
5.0000 mg | Freq: Once | INTRAMUSCULAR | Status: AC
  Administered 2018-09-17: 5 mg via INTRAVENOUS

## 2018-09-17 MED ORDER — DILTIAZEM HCL-DEXTROSE 100-5 MG/100ML-% IV SOLN (PREMIX)
5.0000 mg/h | INTRAVENOUS | Status: DC
  Administered 2018-09-17: 15 mg/h via INTRAVENOUS
  Administered 2018-09-17: 10 mg/h via INTRAVENOUS
  Filled 2018-09-17 (×4): qty 100

## 2018-09-17 MED ORDER — DILTIAZEM HCL 25 MG/5ML IV SOLN
INTRAVENOUS | Status: AC
  Filled 2018-09-17: qty 5

## 2018-09-17 MED ORDER — ALBUTEROL SULFATE (2.5 MG/3ML) 0.083% IN NEBU: 2.5000 mg | INHALATION_SOLUTION | RESPIRATORY_TRACT | Status: DC | PRN

## 2018-09-17 MED ORDER — TRAMADOL HCL 50 MG PO TABS
50.0000 mg | ORAL_TABLET | Freq: Two times a day (BID) | ORAL | Status: DC
  Administered 2018-09-17: 50 mg via ORAL
  Filled 2018-09-17: qty 1

## 2018-09-17 MED ORDER — FUROSEMIDE 10 MG/ML IJ SOLN
20.0000 mg | Freq: Once | INTRAMUSCULAR | Status: AC
  Administered 2018-09-17: 20 mg via INTRAVENOUS

## 2018-09-17 MED ORDER — PIPERACILLIN-TAZOBACTAM 3.375 G IVPB
3.3750 g | Freq: Three times a day (TID) | INTRAVENOUS | Status: DC
  Administered 2018-09-17 – 2018-09-18 (×4): 3.375 g via INTRAVENOUS
  Filled 2018-09-17 (×4): qty 50

## 2018-09-17 MED ORDER — HYDRALAZINE HCL 20 MG/ML IJ SOLN
10.0000 mg | INTRAMUSCULAR | Status: DC | PRN
  Administered 2018-09-17: 20 mg via INTRAVENOUS
  Administered 2018-09-19: 10 mg via INTRAVENOUS
  Filled 2018-09-17: qty 1

## 2018-09-17 MED ORDER — DILTIAZEM HCL 25 MG/5ML IV SOLN
10.0000 mg | Freq: Once | INTRAVENOUS | Status: AC
  Administered 2018-09-17: 10 mg via INTRAVENOUS

## 2018-09-17 MED ORDER — ACETAMINOPHEN 160 MG/5ML PO SOLN
650.0000 mg | Freq: Two times a day (BID) | ORAL | Status: DC
  Administered 2018-09-17: 650 mg via ORAL
  Filled 2018-09-17 (×2): qty 20.3

## 2018-09-17 MED ORDER — HALOPERIDOL LACTATE 5 MG/ML IJ SOLN
INTRAMUSCULAR | Status: AC
  Filled 2018-09-17: qty 1

## 2018-09-17 NOTE — Progress Notes (Signed)
Family in room feeding patient a yogurt. Was informed by dayshift nurse that [place] will be here in the morning to do a ST evaluation. Staff will continue to keep patient NPO.

## 2018-09-17 NOTE — Progress Notes (Signed)
SLP Cancellation Note  Patient Details Name: Janice Hoffman MRN: [id number] DOB: [birth year]   Cancelled treatment:       Reason Eval/Treat Not Completed: Patient not medically ready(chart reviewed; consulted [name] re: pt's status ). Per [name] report, pt is not appropriate for po intake at this time. Noted [name] note from this morning when increased O2 support had to be given d/t increased WOB.  [name] agreed w/ ST services holding on performing BSE at this time. [name] also reported family is meeting w/ Palliative Care to establish [place].  ST services will f/u w/ pt's status tomorrow.     Orinda Kenner, MS, CCC-SLP Girolamo Lortie 09/17/2018, 1:52 PM

## 2018-09-17 NOTE — Progress Notes (Signed)
[facility] NAME: Janice Hoffman    MR#:  [id number]  DATE OF BIRTH:  [birth year]  SUBJECTIVE: Admitted for pneumonia, possible aspiration, extubated, very confused now, fell A. fib with RVR, started on amiodarone infusion.  CHIEF COMPLAINT:   Chief Complaint  Patient presents with  . Fever    REVIEW OF SYSTEMS:   ROS Unable to obtain because of intubation  DRUG ALLERGIES:   Allergies  Allergen Reactions  . Aspirin Shortness Of Breath  . Nsaids Shortness Of Breath and Anaphylaxis    Throat swelling, shortness of breath  . Sulfa Antibiotics Other (See Comments)    Stomach pain Stomach pain   . Codeine Other (See Comments)    Short of breath   . Ciprofloxacin Hives  . Penicillins     Has patient had a PCN reaction causing immediate rash, facial/tongue/throat swelling, SOB or lightheadedness with hypotension: Unknown Has patient had a PCN reaction causing severe rash involving mucus membranes or skin necrosis: Unknown Has patient had a PCN reaction that required hospitalization: Unknown Has patient had a PCN reaction occurring within the last 10 years: Unknown If all of the above answers are "NO", then may proceed with Cephalosporin use.   Marland Kitchen Phenol     Other reaction(s): Angioedema (ALLERGY/intolerance), Other (See Comments) Spasms, nerve symptoms Spasms, nerve symptoms   . Promethazine Hcl     VITALS:  Blood pressure (!) 147/79, pulse (!) 104, temperature 98.2 F (36.8 C), resp. rate (!) 28, height 5\' 4"  (1.626 m), weight 54.4 kg, SpO2 98 %.  PHYSICAL EXAMINATION:  GENERAL:  85 y.o.-year-old patient lying in the bed with no acute distress.  Very confused EYES: Pupils equal, round, reactive to light . No scleral icterus. Extraocular muscles intact.  HEENT: Head atraumatic, normocephalic. Oropharynx and nasopharynx clear.  NECK:  Supple, no jugular venous distention. No thyroid enlargement, no tenderness.   LUNGS: Normal breath sounds bilaterally, no wheezing, rales,rhonchi or crepitation. No use of accessory muscles of respiration.  CARDIOVASCULAR: S1, S2 normal. No murmurs, rubs, or gallops.  ABDOMEN: Soft, nontender, nondistended. Bowel sounds present. No organomegaly or mass.  EXTREMITIES: No pedal edema, cyanosis, or clubbing.  NEUROLOGIC: Neuro exam not able to be done because of confusion pSYCHIATRIC: Confused   LABORATORY PANEL:   CBC Recent Labs  Lab 09/17/18 0225  WBC 15.7*  HGB 11.3*  HCT 34.5*  PLT 427*   ------------------------------------------------------------------------------------------------------------------  Chemistries  Recent Labs  Lab 09/14/18 2329  09/17/18 0225  NA 135   < > 138  K 3.9   < > 4.4  CL 98   < > 103  CO2 26   < > 24  GLUCOSE 115*   < > 112*  BUN 19   < > 31*  CREATININE 0.51   < > 0.69  CALCIUM 8.8*   < > 9.0  MG  --    < > 1.9  AST 23  --   --   ALT 14  --   --   ALKPHOS 73  --   --   BILITOT 1.4*  --   --    < > = values in this interval not displayed.   ------------------------------------------------------------------------------------------------------------------  Cardiac Enzymes Recent Labs  Lab 09/17/18 0751  TROPONINI 0.08*   ------------------------------------------------------------------------------------------------------------------  RADIOLOGY:  Dg Chest 1 View  Result Date: 09/17/2018 CLINICAL DATA:  Acute respiratory distress. EXAM: CHEST  1 VIEW COMPARISON:  Radiographs 09/15/2018 FINDINGS:  Endotracheal and enteric tubes have been removed. Patchy bibasilar opacities are similar to prior exam. Slight improvement in left pleural effusion. Unchanged heart size and mediastinal contours. No pulmonary edema or pneumothorax. Scoliotic curvature of spine. IMPRESSION: Patchy bibasilar opacities are similar to prior exam. Slight improvement in left pleural effusion. Electronically Signed   By: Keith Rake M.D.    On: 09/17/2018 02:11   Dg Abd 1 View  Result Date: 09/15/2018 CLINICAL DATA:  OG tube placement. EXAM: ABDOMEN - 1 VIEW COMPARISON:  One-view abdomen 12/15/2017 FINDINGS: Side port of an OG tube is in the stomach. The stomach is decompressed. Gas is present in the proximal colon. There is moderate stool throughout the distal colon. There is no obstruction or free air. Levoconvex scoliosis of the lumbar spine is again seen. Atherosclerotic calcifications are present. IMPRESSION: 1. Side port of the OG tube is in the fundus of the stomach. 2. Mild gaseous distention of colon proximally with moderate stool distally. Electronically Signed   By: San Morelle M.D.   On: 09/15/2018 10:33   Dg Abd Portable 1v  Result Date: 09/16/2018 CLINICAL DATA:  Nasogastric tube placement. EXAM: PORTABLE ABDOMEN - 1 VIEW COMPARISON:  Radiograph of September 15, 2018. FINDINGS: The bowel gas pattern is normal. Distal tip of feeding tube is seen in expected position of distal stomach. No radio-opaque calculi or other significant radiographic abnormality are seen. IMPRESSION: Distal tip of feeding tube seen in expected position of distal stomach. No definite evidence of bowel obstruction or ileus. Electronically Signed   By: Marijo Conception, M.D.   On: 09/16/2018 16:19   Dg Abd Portable 1v  Result Date: 09/15/2018 CLINICAL DATA:  Enteric tube adjustment. EXAM: PORTABLE ABDOMEN - 1 VIEW COMPARISON:  Abdominal x-ray from same day at 1322. FINDINGS: There has been slight advancement of the enteric tube, with the distal side port now likely just beyond the gastroesophageal junction. Tip remains in the stomach. The bowel gas pattern is unchanged. Stable bibasilar opacities and small left pleural effusion. Endotracheal tube appropriately positioned with the tip 4.7 cm above the carina. IMPRESSION: 1. Slight advancement of the enteric tube with the distal side port now likely just beyond the gastroesophageal junction. Electronically  Signed   By: Titus Dubin M.D.   On: 09/15/2018 14:19   Dg Abd Portable 1v  Result Date: 09/15/2018 CLINICAL DATA:  NG tube placement EXAM: PORTABLE ABDOMEN - 1 VIEW COMPARISON:  KUB from earlier today FINDINGS: The NG tube has not changed significantly in position with the tip in the proximal body of the stomach. The opening along the distal portion of the tube lies near the gastroesophageal junction and advancement would be helpful. The bowel gas pattern is nonspecific. Moderate thoracolumbar scoliosis is again noted with diffuse degenerative change. IMPRESSION: 1. Tip of NG tube in the region of the proximal body of the stomach. 2. Opening of the distal portion of the NG tube is near the GE junction. Recommend advancing the tube. Electronically Signed   By: Ivar Drape M.D.   On: 09/15/2018 13:34   Dg Abd Portable 1v  Result Date: 09/15/2018 CLINICAL DATA:  OG tube placement. EXAM: PORTABLE ABDOMEN - 1 VIEW COMPARISON:  Prior exam same day. FINDINGS: OG tube tip and side hole are again noted in the stomach. However the proximal portion of the OG 2 is again noted coiled in the esophagus and repositioning should be considered. IMPRESSION: OG tip and side hole are again noted in the  stomach. However the proximal portion of the OG to is again noted coiled in the esophagus. Repositioning should be considered. These results will be called to the ordering clinician or representative by the Radiologist Assistant, and communication documented in the PACS or zVision Dashboard. Electronically Signed   By: Marcello Moores  Register   On: 09/15/2018 12:46   Dg Abd Portable 1v  Result Date: 09/15/2018 CLINICAL DATA:  85 year old female. Orogastric tube placement. Subsequent encounter. EXAM: PORTABLE ABDOMEN - 1 VIEW COMPARISON:  09/15/2026 12:03 p.m. FINDINGS: Orogastric tube is curled within the distal esophagus. Side hole just beyond expected level of the gastroesophageal junction with tip gastric fundus level. Appearance  without significant change since recent exam. Gas-filled slightly prominent size colon. The possibility of free intraperitoneal air cannot be assessed on a supine view. IMPRESSION: 1. Orogastric tube is curled within the distal esophagus. Side hole just beyond expected level of the gastroesophageal junction with tip gastric fundus level. Appearance without significant change since recent exam. 2. Gas-filled slightly prominent size colon. These results will be called to the ordering clinician or representative by the Radiologist Assistant, and communication documented in the PACS or zVision Dashboard. Electronically Signed   By: Genia Del M.D.   On: 09/15/2018 12:29   Dg Abd Portable 1v  Result Date: 09/15/2018 CLINICAL DATA:  OG tube placement. EXAM: PORTABLE ABDOMEN - 1 VIEW COMPARISON:  09/15/2018. FINDINGS: OG tube noted with coiled loops in the distal esophagus. The tip and side hole of the OG tube are in the stomach. Repositioning with straightening of the OG tube should be considered. Bibasilar atelectasis/infiltrates and small pleural effusions. IMPRESSION: OG tube noted with coiled loops in the distal esophagus. The tip and side hole of the NG tube are in the stomach. Repositioning with straightening of the OG tube should be considered. These results will be called to the ordering clinician or representative by the Radiologist Assistant, and communication documented in the PACS or zVision Dashboard. Electronically Signed   By: Marcello Moores  Register   On: 09/15/2018 12:23    EKG:   Orders placed or performed during the hospital encounter of 09/14/18  . ED EKG 12-Lead  . ED EKG 12-Lead  . EKG 12-Lead  . EKG 12-Lead  . EKG 12-Lead  . EKG 12-Lead    ASSESSMENT AND PLAN:   #1. acute on chronic respiratory failure with hypoxia secondary to possible aspiration pneumonia, extubated, continue IV steroids, bronchodilators, patient is on Zosyn for aspiration pneumonia  #2, A. fib with RVR, patient  has crackles in the bases, continue Cardizem, amiodarone infusion, palliative care consult to discuss goals as patient is very confused .  3 bilateral pneumonia, patient likely had aspiration, subsequent nonproductive food particles in the emergency room. 4.  Mild dementia: Very confused, continue n.p.o., speech therapy consult, patient is on gentle hydration started yesterday, but because she is congested and received 1 dose of Lasix and will stop the fluids   CODE STATUS DNR .  All the records are reviewed and case discussed with Care Management/Social Workerr. Management plans discussed with the patient, family and they are in agreement.  CODE STATUS: DNR  TOTAL TIME TAKING CARE OF THIS PATIENT: 92minutes.   POSSIBLE D/C IN 1-2 DAYS, DEPENDING ON CLINICAL CONDITION.   Epifanio Lesches M.D on 09/17/2018 at 10:22 AM  Between 7am to 6pm - Pager - [phone]  After 6pm go to [email or web address] - password [email or web address]  [facility]  Office  [phone]  CC: Primary care  physician; Baxter Hire, MD   Note: This dictation was prepared with Dragon dictation along with smaller phrase technology. Any transcriptional errors that result from this process are unintentional.

## 2018-09-17 NOTE — Treatment Plan (Signed)
Events of the evening discussed with Darly[facility] Chamber team.  Patient was discussed on multidisciplinary rounds.  She appears to have some cardiac instability.  Developed some issues with atrial fib with RVR.  In addition she has developed some issues with ST elevation on the anterior leads as noted by Marily Memos, NP.  Palliative care has been consulted.  The patient's overall prognosis is poor.  We will continue to endorse comfort care for this patient with advanced age, frailty and debility and multiple comorbid conditions.

## 2018-09-17 NOTE — Progress Notes (Signed)
Pharmacy Antibiotic Note  Janice Hoffman is a 85 y.o. female admitted on 09/14/2018 with aspiration pneumonia.  Pharmacy has been consulted for Zosyn dosing.  Plan: Zosyn 3.375g IV q8h (4 hour infusion).  Height: 5\' 4"  (162.6 cm) Weight: 128 lb 4.9 oz (58.2 kg) IBW/kg (Calculated) : 54.7  Temp (24hrs), Avg:98.9 F (37.2 C), Min:97.9 F (36.6 C), Max:99.5 F (37.5 C)  Recent Labs  Lab 09/14/18 2306 09/14/18 2329 09/15/18 0328 09/16/18 0633 09/17/18 0225  WBC  --  11.3*  --  6.1 15.7*  CREATININE  --  0.51  --  0.71 0.69  LATICACIDVEN 2.17*  --  1.4  --   --     Estimated Creatinine Clearance: 40.4 mL/min (by C-G formula based on SCr of 0.69 mg/dL).    Allergies  Allergen Reactions  . Aspirin Shortness Of Breath  . Nsaids Shortness Of Breath and Anaphylaxis    Throat swelling, shortness of breath  . Sulfa Antibiotics Other (See Comments)    Stomach pain Stomach pain   . Codeine Other (See Comments)    Short of breath   . Ciprofloxacin Hives  . Penicillins     Has patient had a PCN reaction causing immediate rash, facial/tongue/throat swelling, SOB or lightheadedness with hypotension: Unknown Has patient had a PCN reaction causing severe rash involving mucus membranes or skin necrosis: Unknown Has patient had a PCN reaction that required hospitalization: Unknown Has patient had a PCN reaction occurring within the last 10 years: Unknown If all of the above answers are "NO", then may proceed with Cephalosporin use.   Marland Kitchen Phenol     Other reaction(s): Angioedema (ALLERGY/intolerance), Other (See Comments) Spasms, nerve symptoms Spasms, nerve symptoms   . Promethazine Hcl     Antimicrobials this admission: Vancomycin, azithromycin, cefepime, ceftriaxone, doxycycline 1/7  >> Zosyn 1/9   >>   Dose adjustments this admission:   Microbiology results: 1/6 BCx: NG x 2 days 1/7 UCx: 70K E faecalis  1/7 Sputum: NG 24 hours  1/7 MRSA PCR: (-)  Thank you for allowing  pharmacy to be a part of this patient's care.  Shatina Streets S 09/17/2018 3:43 AM

## 2018-09-17 NOTE — Progress Notes (Signed)
Was called to bedside by nursing due to A-fib with RVR and pt with increased work of breathing.  Pt with crackles in the bases bilaterally upon auscultation.  Orders given for 5 mg metoprolol IV, 20 mg IV Lasix, CXR STAT,  place on BiPAP, Precedex if needed to help pt tolerate BiPAP.  Pt is DNR/DNI.  Will also obtain EKG, trend troponin, and check BMP and magnesium.  Cardizem drip if needed for rate control.    Darel Hong, AGACNP-BC [facility] Pager: [phone] Cell: [phone]

## 2018-09-17 NOTE — Progress Notes (Signed)
   09/17/18 0000  Clinical Encounter Type  Visited With Patient  Visit Type Spiritual support  Spiritual Encounters  Spiritual Needs Prayer

## 2018-09-17 NOTE — Consult Note (Signed)
Consultation Note Date: 09/17/2018   Patient Name: Janice Hoffman  DOB: July 06, 1926  MRN: [id number]  Age / Sex: 85 y.o., female  PCP: Baxter Hire, MD Referring Physician: Epifanio Lesches, MD  Reason for Consultation: Establishing goals of care  HPI/Patient Profile: 85 y.o. female  with past medical history of mild dementia, COPD, HTN, and neuralgia admitted on 09/14/2018 with AMS and respiratory distress. Patient was intubated in ED.  She was diagnosed with sepsis and acute on chronic hypoxic respiratory failure secondary to bilateral pna and aspiration. Food particles were suctioned from ET tube after intubation. According to family, patient had not eaten in 3 days. Patient also diagnosed with esophageal dysmotility. Per DR. Patsey Berthold, patient's CT scans as far back as 2017 reveal evidence of esophageal dilation. Patient was extubated 1/8. Patient unable to tolerate feeding tube insertion. Patient has also not been appropriate for SLP evaluation d/t AMS. PMT consulted for [place].  Clinical Assessment and Goals of Care: I have reviewed medical records including EPIC notes, labs and imaging, received report from [place], NP and RN, assessed the patient and then met with patient's son [name]  to discuss diagnosis prognosis, [place], EOL wishes, disposition and options.  I introduced Palliative Medicine as specialized medical care for people living with serious illness. It focuses on providing relief from the symptoms and stress of a serious illness. The goal is to improve quality of life for both the patient and the family.  As far as functional and nutritional status, he tells me patient was doing "great" prior to this current illness. Great appetite and he denies trouble swallowing or aspiration episodes.    We discussed her current illness and what it means in the larger context of her on-going co-morbidities.  We discussed her respiratory failure r/t  aspiration and AMS prevention SLP evaluation. We also discussed her a fib RVR. Patient's son has had conversations with critical care MD earlier today regarding above issues.   The difference between aggressive medical intervention and comfort care was considered in light of the patient's goals of care. We discussed what a continued aggressive medical path looks like: bipap as needed, continued IV medication, and potential feeding tube. We also discussed what a comfort path would look like for this patient.  At this time, [name] would like to maintain current care and see how she does. If the patient decompensates again, he would like aggressive care except intubation and CPR - okay with bipap.   He is very hopeful patient can be evaluated by SLP soon. I suspect results of this evaluation will aid in goals of care conversations.   Hospice and Palliative Care services outpatient were explained and offered. [name] had many questions about the differences between hospital care, palliative care, and hospice care. These were all addressed.   [name] would like to see how the patient does overnight and continue goals of care discussions in the morning so that his wife can be included. Plan to meet with them at 10 AM.   Son did share he was concerned about comfort care because of the medications used - morphine - we discussed that these medications are given as needed to relieve suffering. We also discussed if the patient was not in pain or short of breath that these medications would not be administered.   Questions and concerns were addressed. The family was encouraged to call with questions or concerns.   Primary Decision Maker NEXT OF KIN - son, [place] Levo  OF RECOMMENDATIONS    - Plan to meet with patient's son and DIL in tomorrow morning (1/10) at 10 AM - initial discussion regarding continued aggressive care vs comfort care - hopeful patient is able to work with SLP in am as this will guide  [place] discussions - for now, continue current care - even bipap if needed - maintain DNR  Code Status/Advance Care Planning:  DNR   Symptom Management:   Family is concerned about symptom management medications: specifically morphine - we discussed medications would only be used if patient appeared to be suffering - at this point she appears to be resting comfortable with unlabored breathing  Palliative Prophylaxis:   Aspiration, Bowel Regimen, Delirium Protocol, Frequent Pain Assessment and Oral Care  Additional Recommendations (Limitations, Scope, Preferences):  Full Scope Treatment  Psycho-social/Spiritual:   Desire for further Chaplaincy support:no  Additional Recommendations: Education on Hospice  Prognosis:   Unable to determine  Discharge Planning: To Be Determined      Primary Diagnoses: Present on Admission: . Sepsis due to pneumonia [facility])   I have reviewed the medical record, interviewed the patient and family, and examined the patient. The following aspects are pertinent.  Past Medical History:  Diagnosis Date  . Asthma   . COPD (chronic obstructive pulmonary disease) ([place])   . Herpes zoster   . Hypertension   . Neuralgia    Social History   Socioeconomic History  . Marital status: Widowed    Spouse name: Not on file  . Number of children: Not on file  . Years of education: Not on file  . Highest education level: Not on file  Occupational History  . Occupation: retired  Scientific laboratory technician  . Financial resource strain: Patient refused  . Food insecurity:    Worry: Patient refused    Inability: Patient refused  . Transportation needs:    Medical: Patient refused    Non-medical: Patient refused  Tobacco Use  . Smoking status: Never Smoker  . Smokeless tobacco: Never Used  Substance and Sexual Activity  . Alcohol use: No    Alcohol/week: 0.0 standard drinks  . Drug use: No  . Sexual activity: Not Currently  Lifestyle  . Physical activity:     Days per week: Patient refused    Minutes per session: Patient refused  . Stress: Not on file  Relationships  . Social connections:    Talks on phone: Patient refused    Gets together: Patient refused    Attends religious service: Patient refused    Active member of club or organization: Patient refused    Attends meetings of clubs or organizations: Patient refused    Relationship status: Patient refused  Other Topics Concern  . Not on file  Social History Narrative  . Not on file   Family History  Problem Relation Age of Onset  . COPD Sister    Scheduled Meds: . budesonide (PULMICORT) nebulizer solution  0.5 mg Nebulization BID  . chlorhexidine gluconate (MEDLINE KIT)  15 mL Mouth Rinse BID  . enoxaparin (LOVENOX) injection  40 mg Subcutaneous Q24H  . ipratropium-albuterol  3 mL Nebulization Q6H  . mouth rinse  15 mL Mouth Rinse 10 times per day  . methylPREDNISolone (SOLU-MEDROL) injection  40 mg Intravenous Q12H  . metoprolol tartrate  5 mg Intravenous Q6H   Continuous Infusions: . dexmedetomidine (PRECEDEX) IV infusion    . diltiazem (CARDIZEM) infusion 15 mg/hr (09/17/18 1441)  . piperacillin-tazobactam (ZOSYN)  IV 3.375 g (09/17/18 1233)  PRN Meds:.albuterol, hydrALAZINE, metoprolol tartrate, polyethylene glycol Allergies  Allergen Reactions  . Aspirin Shortness Of Breath  . Nsaids Shortness Of Breath and Anaphylaxis    Throat swelling, shortness of breath  . Sulfa Antibiotics Other (See Comments)    Stomach pain Stomach pain   . Codeine Other (See Comments)    Short of breath   . Ciprofloxacin Hives  . Penicillins     Has patient had a PCN reaction causing immediate rash, facial/tongue/throat swelling, SOB or lightheadedness with hypotension: Unknown Has patient had a PCN reaction causing severe rash involving mucus membranes or skin necrosis: Unknown Has patient had a PCN reaction that required hospitalization: Unknown Has patient had a PCN reaction  occurring within the last 10 years: Unknown If all of the above answers are "NO", then may proceed with Cephalosporin use.   Marland Kitchen Phenol     Other reaction(s): Angioedema (ALLERGY/intolerance), Other (See Comments) Spasms, nerve symptoms Spasms, nerve symptoms   . Promethazine Hcl    Review of Systems  Unable to perform ROS: Acuity of condition    Physical Exam Constitutional:      General: She is not in acute distress.    Interventions: Nasal cannula in place.  Cardiovascular:     Rate and Rhythm: Normal rate. Rhythm irregular.  Pulmonary:     Effort: Pulmonary effort is normal.  Neurological:     Mental Status: She is lethargic and confused.  Psychiatric:        Cognition and Memory: Cognition is impaired. Memory is impaired.     Vital Signs: BP (!) 162/90   Pulse (!) 103   Temp 98.2 F (36.8 C)   Resp (!) 24   Ht '5\' 4"'  (1.626 m)   Wt 54.4 kg   SpO2 98%   BMI 20.59 kg/m  Pain Scale: PAINAD   Pain Score: 0-No pain   SpO2: SpO2: 98 % O2 Device:SpO2: 98 % O2 Flow Rate: .O2 Flow Rate (L/min): 2 L/min  IO: Intake/output summary:   Intake/Output Summary (Last 24 hours) at 09/17/2018 1557 Last data filed at 09/17/2018 3943 Gross per 24 hour  Intake 815.48 ml  Output 2025 ml  Net -1209.52 ml    LBM: Last BM Date: 09/15/18 Baseline Weight: Weight: 52.2 kg Most recent weight: Weight: 54.4 kg     Palliative Assessment/Data: PPS 10% d/t no PO intake    Time Total: 50 minutes Greater than 50%  of this time was spent counseling and coordinating care related to the above assessment and plan.  Juel Burrow, DNP, AGNP-C Palliative Medicine Team [phone] Pager: [phone]

## 2018-09-17 NOTE — Significant Event (Signed)
Pt with ST elevation on cardiac monitor, therefore EKG obtained.  EKG revealed ST elevation in anterior leads and depressed T waves in lateral leads.  Pts son and daugheter-in-law at bedside I discussed significant decline in pt condition.  They stated at this time they would not want to proceed with cardiac catheterization/invasive procedures.  Discussed if they would like to transition pt to comfort measures only.  At this time they would like to discuss plan of care with each other and will notify PCCM when they have made a decision.  Will continue to monitor and assess pt.  Marda Stalker, [place] Pager [phone] (please enter 7 digits) PCCM Consult Pager [phone] (please enter 7 digits)

## 2018-09-18 DIAGNOSIS — F02C0 Dementia in other diseases classified elsewhere, severe, without behavioral disturbance, psychotic disturbance, mood disturbance, and anxiety: Secondary | ICD-10-CM

## 2018-09-18 DIAGNOSIS — R0603 Acute respiratory distress: Secondary | ICD-10-CM

## 2018-09-18 DIAGNOSIS — F0390 Unspecified dementia without behavioral disturbance: Secondary | ICD-10-CM

## 2018-09-18 DIAGNOSIS — G301 Alzheimer's disease with late onset: Secondary | ICD-10-CM

## 2018-09-18 MED ORDER — SODIUM CHLORIDE 0.9 % IV SOLN
1.5000 g | Freq: Four times a day (QID) | INTRAVENOUS | Status: DC
  Administered 2018-09-18 – 2018-09-21 (×11): 1.5 g via INTRAVENOUS
  Filled 2018-09-18 (×13): qty 1.5

## 2018-09-18 MED ORDER — LISINOPRIL 5 MG PO TABS
5.0000 mg | ORAL_TABLET | Freq: Every day | ORAL | Status: DC
  Administered 2018-09-18: 5 mg via ORAL
  Filled 2018-09-18 (×2): qty 1

## 2018-09-18 MED ORDER — ACETAMINOPHEN 325 MG PO TABS
650.0000 mg | ORAL_TABLET | Freq: Four times a day (QID) | ORAL | Status: DC | PRN
  Administered 2018-09-18 – 2018-09-21 (×8): 650 mg via ORAL
  Filled 2018-09-18 (×8): qty 2

## 2018-09-18 MED ORDER — NEPRO/CARBSTEADY PO LIQD: 237.0000 mL | Freq: Two times a day (BID) | ORAL | Status: DC

## 2018-09-18 MED ORDER — METOPROLOL TARTRATE 25 MG PO TABS
25.0000 mg | ORAL_TABLET | Freq: Two times a day (BID) | ORAL | Status: DC
  Administered 2018-09-18 – 2018-09-22 (×9): 25 mg via ORAL
  Filled 2018-09-18 (×9): qty 1

## 2018-09-18 MED ORDER — ORAL CARE MOUTH RINSE
15.0000 mL | Freq: Two times a day (BID) | OROMUCOSAL | Status: DC
  Administered 2018-09-18 – 2018-09-22 (×8): 15 mL via OROMUCOSAL

## 2018-09-18 MED ORDER — BISACODYL 10 MG RE SUPP
10.0000 mg | Freq: Every day | RECTAL | Status: DC | PRN
  Administered 2018-09-18: 10 mg via RECTAL
  Filled 2018-09-18: qty 1

## 2018-09-18 MED ORDER — TRAMADOL HCL 50 MG PO TABS
50.0000 mg | ORAL_TABLET | Freq: Two times a day (BID) | ORAL | Status: DC
  Administered 2018-09-18 – 2018-09-22 (×9): 50 mg via ORAL
  Filled 2018-09-18 (×9): qty 1

## 2018-09-18 NOTE — Progress Notes (Signed)
[facility] [place] NAME: Dody Smartt    MR#:  [id number]  DATE OF BIRTH:  July 11, 1926  Admitted for pneumonia, found to have A. fib with RVR,  CHIEF COMPLAINT:   Chief Complaint  Patient presents with  . Fever    REVIEW OF SYSTEMS:   ROS Unable to obtain because of intubation  DRUG ALLERGIES:   Allergies  Allergen Reactions  . Aspirin Shortness Of Breath  . Nsaids Shortness Of Breath and Anaphylaxis    Throat swelling, shortness of breath  . Sulfa Antibiotics Other (See Comments)    Stomach pain Stomach pain   . Codeine Other (See Comments)    Short of breath   . Ciprofloxacin Hives  . Penicillins     Has patient had a PCN reaction causing immediate rash, facial/tongue/throat swelling, SOB or lightheadedness with hypotension: Unknown Has patient had a PCN reaction causing severe rash involving mucus membranes or skin necrosis: Unknown Has patient had a PCN reaction that required hospitalization: Unknown Has patient had a PCN reaction occurring within the last 10 years: Unknown If all of the above answers are "NO", then may proceed with Cephalosporin use.   Marland Kitchen Phenol     Other reaction(s): Angioedema (ALLERGY/intolerance), Other (See Comments) Spasms, nerve symptoms Spasms, nerve symptoms   . Promethazine Hcl     VITALS:  Blood pressure (!) 175/80, pulse 86, temperature 97.9 F (36.6 C), temperature source Oral, resp. rate (!) 29, height 5\' 4"  (1.626 m), weight 53.7 kg, SpO2 100 %.  PHYSICAL EXAMINATION:  GENERAL:  85 y.o.-year-old patient lying in the bed with no acute distress.  Very confused EYES: Pupils equal, round, reactive to light . No scleral icterus. Extraocular muscles intact.  HEENT: Head atraumatic, normocephalic. Oropharynx and nasopharynx clear.  NECK:  Supple, no jugular venous distention. No thyroid enlargement, no tenderness.  LUNGS: Normal breath sounds bilaterally, no wheezing,  rales,rhonchi or crepitation. No use of accessory muscles of respiration.  CARDIOVASCULAR: S1, S2 normal. No murmurs, rubs, or gallops.  ABDOMEN: Soft, nontender, nondistended. Bowel sounds present. No organomegaly or mass.  EXTREMITIES: No pedal edema, cyanosis, or clubbing.  NEUROLOGIC: Neuro exam not able to be done because of confusion pSYCHIATRIC: Confused   LABORATORY PANEL:   CBC Recent Labs  Lab 09/17/18 0225  WBC 15.7*  HGB 11.3*  HCT 34.5*  PLT 427*   ------------------------------------------------------------------------------------------------------------------  Chemistries  Recent Labs  Lab 09/14/18 2329  09/17/18 0225  NA 135   < > 138  K 3.9   < > 4.4  CL 98   < > 103  CO2 26   < > 24  GLUCOSE 115*   < > 112*  BUN 19   < > 31*  CREATININE 0.51   < > 0.69  CALCIUM 8.8*   < > 9.0  MG  --    < > 1.9  AST 23  --   --   ALT 14  --   --   ALKPHOS 73  --   --   BILITOT 1.4*  --   --    < > = values in this interval not displayed.   ------------------------------------------------------------------------------------------------------------------  Cardiac Enzymes Recent Labs  Lab 09/17/18 0751  TROPONINI 0.08*   ------------------------------------------------------------------------------------------------------------------  RADIOLOGY:  Dg Chest 1 View  Result Date: 09/17/2018 CLINICAL DATA:  Acute respiratory distress. EXAM: CHEST  1 VIEW COMPARISON:  Radiographs 09/15/2018 FINDINGS: Endotracheal and enteric tubes have been removed.  Patchy bibasilar opacities are similar to prior exam. Slight improvement in left pleural effusion. Unchanged heart size and mediastinal contours. No pulmonary edema or pneumothorax. Scoliotic curvature of spine. IMPRESSION: Patchy bibasilar opacities are similar to prior exam. Slight improvement in left pleural effusion. Electronically Signed   By: Keith Rake M.D.   On: 09/17/2018 02:11   Dg Abd Portable 1v  Result  Date: 09/16/2018 CLINICAL DATA:  Nasogastric tube placement. EXAM: PORTABLE ABDOMEN - 1 VIEW COMPARISON:  Radiograph of September 15, 2018. FINDINGS: The bowel gas pattern is normal. Distal tip of feeding tube is seen in expected position of distal stomach. No radio-opaque calculi or other significant radiographic abnormality are seen. IMPRESSION: Distal tip of feeding tube seen in expected position of distal stomach. No definite evidence of bowel obstruction or ileus. Electronically Signed   By: Marijo Conception, M.D.   On: 09/16/2018 16:19    EKG:   Orders placed or performed during the hospital encounter of 09/14/18  . ED EKG 12-Lead  . ED EKG 12-Lead  . EKG 12-Lead  . EKG 12-Lead  . EKG 12-Lead  . EKG 12-Lead    ASSESSMENT AND PLAN:   #1. acute on chronic respiratory failure with hypoxia secondary to possible aspiration pneumonia, extubated, continue IV steroids, bronchodilators, on Unasyn now  #2, A. fib with RVR, continue metoprolol 25 mg p.o. twice daily, 3 .bilateral pneumonia, patient likely had aspiration, subsequent nonproductive food particles in the emergency room. 4.  Mild dementia:  speech therapy recommends dysphagia 1 diet.  CODE STATUS DNR .  All the records are reviewed and case discussed with Care Management/Social Workerr. Management plans discussed with the patient, family and they are in agreement.  CODE STATUS: DNR  TOTAL TIME TAKING CARE OF THIS PATIENT: 78minutes.   POSSIBLE D/C IN 1-2 DAYS, DEPENDING ON CLINICAL CONDITION.   Epifanio Lesches M.D on 09/18/2018 at 1:52 PM  Between 7am to 6pm - Pager - [phone]  After 6pm go to [email or web address] - password [email or web address] [facility]  Office  [phone]  CC: Primary care physician; Baxter Hire, MD   Note: This dictation was prepared with Dragon dictation along with smaller phrase technology. Any transcriptional errors that result from this process are unintentional.

## 2018-09-18 NOTE — Evaluation (Addendum)
Clinical/Bedside Swallow Evaluation Patient Details  Name: Janice Hoffman MRN: [id number] Date of Birth: Jan 10, 1926  Today's Date: 09/18/2018 Time: SLP Start Time (ACUTE ONLY): 1015 SLP Stop Time (ACUTE ONLY): 1115 SLP Time Calculation (min) (ACUTE ONLY): 60 min  Past Medical History:  Past Medical History:  Diagnosis Date  . Asthma   . COPD (chronic obstructive pulmonary disease) ([place])   . Herpes zoster   . Hypertension   . Neuralgia    Past Surgical History:  Past Surgical History:  Procedure Laterality Date  . LEG SURGERY     HPI:  Pt is a 85 y.o. female  with past medical history of mild Dementia, COPD, HTN, and neuralgia admitted on 09/14/2018 with AMS and respiratory distress. Patient was intubated in ED on 09/15/2018.  She was diagnosed with sepsis and acute on chronic hypoxic respiratory failure secondary to bilateral pna and aspiration. Food particles were suctioned from ET tube after intubation. According to family, patient had not eaten in 3 days. Patient also diagnosed with esophageal dysmotility in past. Per Dr. Patsey Berthold, patient's CT scans as far back as 2017 reveal evidence of esophageal dilation. Patient was extubated 09/16/2018. Patient unable to tolerate feeding tube insertion w/ NG "coiling" in the distal Esophagus. Patient has not been appropriate for SLP evaluation d/t AMS but is more awake/alert today.    Assessment / Plan / Recommendation Clinical Impression  Pt appears to present w/ Mild-Moderate oropharyngeal phase dysphagia suspect most impacted by her overall weakness d/t illness. Pt also has baseline dx of mild Dementia/Cognitive decline and Esophageal dysmotility. Pt exhibited generalized weakness during the oral phase during bolus acceptance and management. Deficits c/b increased oral phase time, decreased labial closure on utensil, and increased A-P transfer time - pt appeared to "munch" the bolus all trials as she attempted posterior transfer. Adequate oral  clearing achieved b/t trials. During the pharyngeal phase, pt exhibited overt coughing and throat clearing w/ trials of ice chips(thin liquids). Given tsp and Cup trials of Nectar consistency liquids, no overt s/s of aspiration noted during/post trials. O2 sats remained 98%, no decline in respiratory status(RR 21). Similar was noted w/ trials of puree. No trials of solids were assessed d/t lacking upper denture plate; demand of exertion in light of pt's decreased stamina for tasks at this time. OM exam revealed no unilateral weakness; tongue base strength was [facility]. Gag reflex somewhat diminished but present. Pt was able to minimally participate in self-feeding but required mod+ support. Recommend a Dysphagia level 1 w/ NECTAR consistency liquids at this time to reduce risk for aspiration from oropharyngeal phase dysphagia in light of pt's illness. Recommend aspiration precautions; Strict REFLUX precautions. Recommend Pills in Puree - Crushed d/t Esophageal dysmotility. Pt will need feeding support. ST services will continue to monitor pt's progress while admitted; [place] education given to Son present on Dysphagia; Esophageal phase dysmotility impact; impact of illness in the older-elderly pt on swallowing; aspiration precautions; diet consistency. Son agreed. MD/[name] updated.  SLP Visit Diagnosis: Dysphagia, oropharyngeal phase (R13.12)    Aspiration Risk  Mild aspiration risk;Risk for inadequate nutrition/hydration(-Moderate aspiration risk)    Diet Recommendation  Dysphagia level 1 (PUREE foods) w/ NECTAR consistency liquids; aspiration precautions; REFLUX precautions; feeding support during meals  Medication Administration: Crushed with puree(for safer swallowing)    Other  Recommendations Recommended Consults: Consider GI evaluation(if warranted for further education/assessment; Dietician) Oral Care Recommendations: Oral care BID;Staff/trained caregiver to provide oral care Other Recommendations:  Order thickener from pharmacy;Prohibited food (jello, ice  cream, thin soups);Remove water pitcher;Have oral suction available   Follow up Recommendations (TBD); Palliative Care following     Frequency and Duration min 3x week  2 weeks       Prognosis Prognosis for Safe Diet Advancement: Fair Barriers to Reach Goals: Cognitive deficits;Time post onset;Severity of deficits(Esophageal phase deficits)      Swallow Study   General Date of Onset: 09/15/18 HPI: Pt is a 85 y.o. female  with past medical history of mild Dementia, COPD, HTN, and neuralgia admitted on 09/14/2018 with AMS and respiratory distress. Patient was intubated in ED on 09/15/2017.  She was diagnosed with sepsis and acute on chronic hypoxic respiratory failure secondary to bilateral pna and aspiration. Food particles were suctioned from ET tube after intubation. According to family, patient had not eaten in 3 days. Patient also diagnosed with esophageal dysmotility in past. Per Dr. Patsey Berthold, patient's CT scans as far back as 2017 reveal evidence of esophageal dilation. Patient was extubated 09/16/2017. Patient unable to tolerate feeding tube insertion w/ NG "coiling" in the distal Esophagus. Patient has not been appropriate for SLP evaluation d/t AMS but is more awake/alert today.  Type of Study: Bedside Swallow Evaluation Previous Swallow Assessment: none Diet Prior to this Study: NPO("regular diet at home" per Son - unsure ) Temperature Spikes Noted: No(wbc 15.7 yesterday) Respiratory Status: Nasal cannula(2 liters) History of Recent Intubation: Yes Length of Intubations (days): 2 days Date extubated: 09/16/18 Behavior/Cognition: Alert;Cooperative;Pleasant mood;Distractible;Requires cueing(baseline mild cognitive decline) Oral Cavity Assessment: Dry(sticky) Oral Care Completed by SLP: Yes Oral Cavity - Dentition: Dentures, top;Dentures, bottom(not wearing her upper denture plate) Vision: Functional for  self-feeding Self-Feeding Abilities: Able to feed self;Needs assist;Needs set up(overall weakness; shakiness) Patient Positioning: Upright in bed(needed positioning) Baseline Vocal Quality: Low vocal intensity(min mumbled speech at times) Volitional Cough: Strong;Congested Volitional Swallow: Able to elicit    Oral/Motor/Sensory Function Overall Oral Motor/Sensory Function: Generalized oral weakness(but grossly WFL)   Ice Chips Ice chips: Impaired Presentation: Spoon(fed; 2 trials) Oral Phase Impairments: Reduced labial seal;Reduced lingual movement/coordination(slow movements - weakness) Oral Phase Functional Implications: Prolonged oral transit Pharyngeal Phase Impairments: Suspected delayed Swallow;Cough - Immediate;Throat Clearing - Immediate   Thin Liquid Thin Liquid: Not tested    Nectar Thick Nectar Thick Liquid: Impaired Presentation: Cup;Self Fed;Spoon(assisted fully; 3 tsp trials, 5 Cup sip trials) Oral Phase Impairments: Reduced labial seal;Reduced lingual movement/coordination(slow movements - weakness) Oral phase functional implications: Prolonged oral transit Pharyngeal Phase Impairments: Suspected delayed Swallow(no other) Other Comments: verbal cues   Honey Thick Honey Thick Liquid: Not tested   Puree Puree: Impaired Presentation: Spoon(fed; 8 trials) Oral Phase Impairments: Reduced labial seal;Reduced lingual movement/coordination(slow movements - weakness) Oral Phase Functional Implications: Prolonged oral transit Pharyngeal Phase Impairments: (none)   Solid     Solid: Not tested      Orinda Kenner, MS, CCC-SLP Pranish Akhavan 09/18/2018,3:38 PM

## 2018-09-18 NOTE — Progress Notes (Signed)
Nutrition Follow Up Note   DOCUMENTATION CODES:   Severe malnutrition in context of chronic illness  INTERVENTION:   Nepro Shake po BID, each supplement provides 425 kcal and 19 grams protein  Magic cup TID with meals, each supplement provides 290 kcal and 9 grams of protein  NUTRITION DIAGNOSIS:   Severe Malnutrition related to chronic illness(COPD, advanced age) as evidenced by severe fat depletion, moderate-severe muscle depletion.  GOAL:   Patient will meet greater than or equal to 90% of their needs  MONITOR:   PO intake, Supplement acceptance, Labs, Weight trends, Skin, I & O's  ASSESSMENT:   85 year old female with PMHx of COPD, asthma, HTN admitted with acute on chronic hypoxic respiratory failure secondary to aspiration PNA, sepsis. Patient was intubated 1/7 and extubated 1/8.   NGT removed. Pt does not want another NGT placed, states "I would rather die before I get another tube". Pt seen by SLP today and placed on a dysphagia 1/nectar thick diet. RD will add supplements to help pt meet his estimated needs. Palliative care following for GOC.   Medications reviewed and include: lovenox, tramadol, unasyn   Labs reviewed: K 4.4 wnl, P 4.0 wnl, Mg 1.9 wnl- 1/9  Diet Order:   Diet Order            DIET - DYS 1 Room service appropriate? Yes with Assist; Fluid consistency: Nectar Thick  Diet effective now             EDUCATION NEEDS:   Not appropriate for education at this time  Skin:  Skin Assessment: Reviewed RN Assessment  Last BM:  09/16/2018 - medium type 6  Height:   Ht Readings from Last 1 Encounters:  09/15/18 5\' 4"  (1.626 m)   Weight:   Wt Readings from Last 1 Encounters:  09/18/18 53.7 kg   Ideal Body Weight:  54.5 kg  BMI:  Body mass index is 20.32 kg/m.  Estimated Nutritional Needs:   Kcal:  1200-1400kcal/day   Protein:  70-80g/day   Fluid:  1.4L/day   Koleen Distance MS, RD, LDN Pager #- [phone] Office#- [phone] After  Hours Pager: [phone]

## 2018-09-18 NOTE — Progress Notes (Signed)
PT Cancellation Note  Patient Details Name: Janice Hoffman MRN: [id number] DOB: [birth year]   Cancelled Treatment:    Reason Eval/Treat Not Completed: Fatigue/lethargy limiting ability to participate;Medical issues which prohibited therapy Pt is laying in bed sleeping, PT able to wake her but she is very tired.  She c/o leg pain (long standing L side injury) and stated that she needed "a few days" before she did any exercises or walking.  Family arrived and stated agreement to let her rest today and try to come back tomorrow to assess.  Will plan on PT eval 09/19/18.  Kreg Shropshire, DPT 09/18/2018, 3:47 PM

## 2018-09-18 NOTE — Treatment Plan (Addendum)
Discussed at multidisciplinary rounds with the ICU team.  No issues overnight.  She passed swallow evaluation cleared for pured diet with thick liquids.  Resume her previous cardiac medications.  She is DNR status, no further aggressive interventions.  May be transfer to that surge floor.  May be transferred to [place] floor.  Son at bedside, updated.  Pulmonary/Critical Care will sign off please reconsult as needed.

## 2018-09-18 NOTE — Progress Notes (Signed)
Daily Progress Note   Patient Name: Janice Hoffman       Date: 09/18/2018 DOB: 1925/12/24  Age: 85 y.o. MRN#: [id number] Attending Physician: Epifanio Lesches, MD Primary Care Physician: Baxter Hire, MD Admit Date: 09/14/2018  Reason for Consultation/Follow-up: Establishing goals of care  Subjective: Patient more responsive today, tells me her knees hurt - this is chronic pain for her - tells me tramadol helps, this has been restarted  Length of Stay: 3  Current Medications: Scheduled Meds:  . budesonide (PULMICORT) nebulizer solution  0.5 mg Nebulization BID  . enoxaparin (LOVENOX) injection  40 mg Subcutaneous Q24H  . ipratropium-albuterol  3 mL Nebulization Q6H  . lisinopril  5 mg Oral Daily  . mouth rinse  15 mL Mouth Rinse BID  . metoprolol tartrate  25 mg Oral BID  . traMADol  50 mg Oral Q12H    Continuous Infusions: . ampicillin-sulbactam (UNASYN) IV 1.5 g (09/18/18 1509)    PRN Meds: acetaminophen, albuterol, bisacodyl, hydrALAZINE, metoprolol tartrate, polyethylene glycol  Physical Exam       Constitutional:      General: She is not in acute distress.    Interventions: Nasal cannula in place.  Cardiovascular:     Rate and Rhythm: Normal rate. Rhythm irregular.  Pulmonary:     Effort: Pulmonary effort is normal.  Neurological:     Mental Status: She is lethargic and confused.  Psychiatric:        Cognition and Memory: Cognition is impaired. Memory is impaired.     Vital Signs: BP (!) 169/81 (BP Location: Left Arm)   Pulse 69   Temp 97.7 F (36.5 C) (Oral)   Resp (!) 29   Ht 5\' 4"  (1.626 m)   Wt 53.7 kg   SpO2 100%   BMI 20.32 kg/m  SpO2: SpO2: 100 % O2 Device: O2 Device: Room Air O2 Flow Rate: O2 Flow Rate (L/min): 2 L/min  Intake/output summary:    Intake/Output Summary (Last 24 hours) at 09/18/2018 1542 Last data filed at 09/18/2018 0800 Gross per 24 hour  Intake 338.8 ml  Output -  Net 338.8 ml   LBM: Last BM Date: 09/15/18 Baseline Weight: Weight: 52.2 kg Most recent weight: Weight: 53.7 kg       Palliative Assessment/Data: PPS 30%    Flowsheet Rows     Most Recent Value  Intake Tab  Referral Department  Critical care  Unit at Time of Referral  Intermediate Care Unit  Palliative Care Primary Diagnosis  Pulmonary  Date Notified  09/17/18  Palliative Care Type  New Palliative care  Reason for referral  Clarify Goals of Care  Date of Admission  09/15/18  Date first seen by Palliative Care  09/17/18  # of days Palliative referral response time  0 Day(s)  # of days IP prior to Palliative referral  2  Clinical Assessment  Palliative Performance Scale Score  10%  Psychosocial & Spiritual Assessment  Palliative Care Outcomes  Patient/Family meeting held?  Yes  Who was at the meeting?  son  Palliative Care Outcomes  Clarified goals of care, Counseled regarding hospice, Provided end of life care assistance, Provided psychosocial or spiritual support  Patient Active Problem List   Diagnosis Date Noted  . Pneumonia of both lower lobes due to infectious organism ([place])   . Palliative care by specialist   . Goals of care, counseling/discussion   . Protein-calorie malnutrition, severe 09/16/2018  . Sepsis due to pneumonia ([place]) 09/15/2018  . Sepsis ([place]) 07/18/2018  . Acute exacerbation of chronic obstructive pulmonary disease (COPD) ([place]) 03/14/2018  . Acute on chronic respiratory failure with hypoxemia ([place]) 02/22/2018  . CAP (community acquired pneumonia) 08/18/2017  . New onset a-fib ([place]) 06/15/2017  . Near syncope 05/31/2017  . COPD with acute exacerbation ([place]) 01/05/2017  . Essential hypertension, malignant 01/05/2017  . Dysphagia 01/05/2017  . COPD exacerbation ([place]) 03/15/2016  . Osteoarthritis  10/12/2014  . Osteoporosis, post-menopausal 10/12/2014  . Pernicious anemia 06/07/2014  . Vitamin D deficiency 06/07/2014  . Post-traumatic osteoarthritis of left knee 05/24/2014  . Pain in joint, lower leg 08/03/2013  . 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 ELEVATED BLOOD PRESSURE 05/12/2008  . HERPES ZOSTER 04/26/2008  . B12 DEFICIENCY 04/26/2008  . CONSTIPATION 04/26/2008  . LEG EDEMA, BILATERAL 04/26/2008    Palliative Care Assessment & Plan   HPI: 85 y.o. female  with past medical history of mild dementia, COPD, HTN, and neuralgia admitted on 09/14/2018 with AMS and respiratory distress. Patient was intubated in ED.  She was diagnosed with sepsis and acute on chronic hypoxic respiratory failure secondary to bilateral pna and aspiration. Food particles were suctioned from ET tube after intubation. According to family, patient had not eaten in 3 days. Patient also diagnosed with esophageal dysmotility. Per DR. Patsey Berthold, patient's CT scans as far back as 2017 reveal evidence of esophageal dilation. Patient was extubated 1/8. Patient unable to tolerate feeding tube insertion. Patient has also not been appropriate for SLP evaluation d/t AMS. PMT consulted for [place].  Assessment: Follow up meeting with patient's son and daughter in law. Provided clinical update: we discussed improvements - patient moved out of ICU, off of bipap, no longer requiring IV cardizem, and tolerating small amount of PO intake. We also discussed concerns about patient's ongoing frailty, poor PO intake, risks for aspiration, and overall failure to thrive. They express understanding that patient will likely not return to her baseline.   Their main concern moving forward is what type of care she will need moving forward. They ask about rehab facility vs ALF. They also discussed going home with home health. I also shared with them the option to go home with hospice care. After much discussion of each option, family seems to lean  towards the idea of going home with hospice and hiring a private caregiver to provide extra support at home. I provided them a list of prive caregiver services. We discussed philosophy of hospice and focusing on comfort and quality of life. [name] again tells me "I'm not ready to throw in the towel". I encouraged him that hospice care was still care for his mother, but a different type of care that would support her through her expected decline and end of life. He shares his fears that she would be "pumped full of morphine". I shared with him that morphine is a useful medication if someone is suffering for relieving pain or shortness of breath - but that it would be used carefully to relieve suffering and not used if she did not need it. [name] seems relieved by this discussion and again shares that he thinks he would like his mother to go home with hospice - but  wants to see how she does over the next couple of days.   We discussed how we should approach care moving forward - [name] does share that he wants to limit care - such as he does not want a cardiac workup - however, when I asked about an acute decompensation he tells me he would want his mother to return to ICU for bipap. We discussed that if his mother required this type of care this would be a very poor indicator of how she was going to do - he agrees and tells me he would likely transition to comfort care at that point but would want to make that decision then.   Discussed seeing how she does over the weekend and following up Monday if she remains in the hospital. Questions and concerns were addressed. The family was encouraged to call with questions or concerns.   Recommendations/Plan:  Continued discussion about goals of care - educated family about poor long-term prognosis d/t frailty, poor PO intake, risks for aspiration, and overall failure to thrive   Thorough discussion about different disposition options - family wants to see how she does  over the weekend but is leaning towards home with hospice and hiring a personal caregiver  If patient experiences acute decompensation over the weekend they would like her to return to ICU - we discussed that if this were to happen it may be best to focus on her comfort - they agree but tell me they want to make that decision then   PMT to f/u Mon if patient remains hospitalized  Goals of Care and Additional Recommendations:  Limitations on Scope of Treatment: Full Scope Treatment  Code Status:  DNR  Prognosis:   < 6 months  Discharge Planning:  To Be Determined  Care plan was discussed with patient's son and daughter-in-law  Thank you for allowing the Palliative Medicine Team to assist in the care of this patient.   Time In: 1345 Time Out: 1500 Total Time 75 minutes Prolonged Time Billed  yes       Greater than 50%  of this time was spent counseling and coordinating care related to the above assessment and plan.  Juel Burrow, DNP, [facility] Palliative Medicine Team Team Phone # [phone]  Pager [phone]

## 2018-09-19 LAB — CULTURE, BLOOD (ROUTINE X 2)
CULTURE: NO GROWTH
Culture: NO GROWTH
Special Requests: ADEQUATE
Special Requests: ADEQUATE

## 2018-09-19 LAB — CBC
HCT: 37.1 % (ref 36.0–46.0)
Hemoglobin: 11.5 g/dL — ABNORMAL LOW (ref 12.0–15.0)
MCH: 29.3 pg (ref 26.0–34.0)
MCHC: 31 g/dL (ref 30.0–36.0)
MCV: 94.4 fL (ref 80.0–100.0)
Platelets: 334 10*3/uL (ref 150–400)
RBC: 3.93 MIL/uL (ref 3.87–5.11)
RDW: 14.6 % (ref 11.5–15.5)
WBC: 10.6 10*3/uL — ABNORMAL HIGH (ref 4.0–10.5)
nRBC: 0 % (ref 0.0–0.2)

## 2018-09-19 NOTE — Progress Notes (Signed)
Physical Therapy Evaluation Patient Details Name: Janice Hoffman MRN: [id number] DOB: 05/05/1926 Today's Date: 09/19/2018   History of Present Illness   85 yo female who was admitted for sepsis PNA and noted pleural effusion, was on O2 at baseline and walking only short trips on RW.  Pt was demonstrating SIRS symptoms, had elevation of troponin but now more alert.  Pt self extubated NG tube.  PMHx:  AMS, UTI sepsis, COPD, mild dementia, HTN, neuralgia, hypoxemia, aspiration PNA,    Clinical Impression  Pt was up with room air to test tolerance for activity, noted drop to 70% that required replacement of O2.  Her tolerance for standing was only mod max assist to power up and lists strongly to L, used direct PT assistance without walker for safer transition to [facility].  Daughter in law is aware PT recommending SNF and is concerned for pt willingness to comply.  Will follow acutely for progression to more mobility as pt can tolerate with her level of endurance and control of balance.    Follow Up Recommendations SNF    Equipment Recommendations  None recommended by PT    Recommendations for Other Services       Precautions / Restrictions Precautions Precautions: Fall(telemetry) Precaution Comments: able to ask to use BSC and wait Restrictions Weight Bearing Restrictions: No      Mobility  Bed Mobility Overal bed mobility: Needs Assistance Bed Mobility: Supine to Sit;Sit to Supine     Supine to sit: Min assist Sit to supine: Mod assist   General bed mobility comments: mod assist to lift legs to bed  Transfers Overall transfer level: Needs assistance Equipment used: Rolling walker (2 wheeled);1 person hand held assist Transfers: Sit to/from Stand Sit to Stand: Mod assist;Max assist         General transfer comment: max to power up and mod to move her, used RW unsuccessfully as pt cannot support on arms enough to benefit  Ambulation/Gait Ambulation/Gait assistance: Mod  assist Gait Distance (Feet): 2 Feet Assistive device: 1 person hand held assist Gait Pattern/deviations: Step-to pattern;Shuffle;Wide base of support;Trunk flexed Gait velocity: reduced Gait velocity interpretation: <1.8 ft/sec, indicate of risk for recurrent falls General Gait Details: sidesteps to and from chair with PT assisting her feet to slide to get onto and off BSC  Stairs Stairs: (not able to attempt)          Wheelchair Mobility    Modified Rankin (Stroke Patients Only)       Balance Overall balance assessment: Needs assistance Sitting-balance support: Feet supported;Bilateral upper extremity supported Sitting balance-Leahy Scale: Fair Sitting balance - Comments: fair once set   Standing balance support: Bilateral upper extremity supported;During functional activity Standing balance-Leahy Scale: Poor                               Pertinent Vitals/Pain Pain Assessment: Faces Pain Score: 0-No pain Faces Pain Scale: No hurt    Home Living Family/patient expects to be discharged to:: Private residence Living Arrangements: Children;Other relatives Available Help at Discharge: Family Type of Home: House Home Access: Stairs to enter Entrance Stairs-Rails: Right Entrance Stairs-Number of Steps: (1+1 or 5) Home Layout: One level Home Equipment: Walker - 2 wheels;Cane - single point;Wheelchair - manual Additional Comments: pt lives with son and his wife, who is present and very informational during the session    Prior Function Level of Independence: Independent with assistive device(s)  Comments: used RW for short trips between rooms     Hand Dominance   Dominant Hand: Right    Extremity/Trunk Assessment   Upper Extremity Assessment Upper Extremity Assessment: Generalized weakness    Lower Extremity Assessment Lower Extremity Assessment: Generalized weakness    Cervical / Trunk Assessment Cervical / Trunk Assessment:  Kyphotic;Other exceptions(has a scoliosis)  Communication   Communication: HOH(pt needs to see your face to assist comprehension)  Cognition Arousal/Alertness: Lethargic Behavior During Therapy: Flat affect Overall Cognitive Status: History of cognitive impairments - at baseline                                        General Comments General comments (skin integrity, edema, etc.): Pt is demonstrating some control of sitting which allowed her to use the [facility] supervised.    Exercises     Assessment/Plan    PT Assessment Patient needs continued PT services  PT Problem List Decreased strength;Decreased range of motion;Decreased activity tolerance;Decreased balance;Decreased mobility;Decreased coordination;Decreased knowledge of use of DME;Decreased safety awareness;Decreased knowledge of precautions;Cardiopulmonary status limiting activity       PT Treatment Interventions DME instruction;Gait training;Functional mobility training;Stair training;Therapeutic activities;Therapeutic exercise;Balance training;Neuromuscular re-education;Patient/family education    PT Goals (Current goals can be found in the Care Plan section)  Acute Rehab PT Goals Patient Stated Goal: none stated PT Goal Formulation: With family Time For Goal Achievement: 10/03/18 Potential to Achieve Goals: Fair    Frequency Min 2X/week   Barriers to discharge Inaccessible home environment;Other (comment)(stairs at every entrance to her home) daughter in law reports 2 to 7 stairs to enter house    Co-evaluation               AM-PAC PT "6 Clicks" Mobility  Outcome Measure Help needed turning from your back to your side while in a flat bed without using bedrails?: A Lot Help needed moving from lying on your back to sitting on the side of a flat bed without using bedrails?: A Lot Help needed moving to and from a bed to a chair (including a wheelchair)?: A Lot Help needed standing up from a chair  using your arms (e.g., wheelchair or bedside chair)?: A Lot Help needed to walk in hospital room?: A Lot Help needed climbing 3-5 steps with a railing? : Total 6 Click Score: 11    End of Session Equipment Utilized During Treatment: Oxygen(O2 sats off O2 down to 70% from standing attempt) Activity Tolerance: Patient limited by fatigue;Treatment limited secondary to medical complications (Comment) Patient left: in bed;with call bell/phone within reach;with bed alarm set;with family/visitor present Nurse Communication: Mobility status PT Visit Diagnosis: Unsteadiness on feet (R26.81);Muscle weakness (generalized) (M62.81);Difficulty in walking, not elsewhere classified (R26.2);Adult, failure to thrive (R62.7)    Time: 1017-5102 PT Time Calculation (min) (ACUTE ONLY): 48 min   Charges:   PT Evaluation $PT Eval Moderate Complexity: 1 Mod PT Treatments $Gait Training: 8-22 mins       Ramond Dial 09/19/2018, 12:22 PM  Mee Hives, PT MS Acute Rehab Dept. Number: [place] and [place]

## 2018-09-19 NOTE — Progress Notes (Signed)
[place] at [place] NAME: Rory Montel    MR#:  [id number]  DATE OF BIRTH:  20-Feb-1926  Admitted for pneumonia, found to have A. fib with RVR, transferred to floor yesterday, started late bedside, states that she is not liking nectar. thick liquids and wants plain water.  CHIEF COMPLAINT:   Chief Complaint  Patient presents with  . Fever    REVIEW OF SYSTEMS:   Review of Systems  Constitutional: Negative for chills and fever.  HENT: Negative for hearing loss.   Eyes: Negative for blurred vision, double vision and photophobia.  Respiratory: Negative for cough, hemoptysis and shortness of breath.   Cardiovascular: Negative for palpitations, orthopnea and leg swelling.  Gastrointestinal: Negative for abdominal pain, diarrhea and vomiting.  Genitourinary: Negative for dysuria and urgency.  Musculoskeletal: Negative for myalgias and neck pain.  Skin: Negative for rash.  Neurological: Negative for dizziness, focal weakness, seizures, weakness and headaches.  Psychiatric/Behavioral: Negative for memory loss. The patient does not have insomnia.      DRUG ALLERGIES:   Allergies  Allergen Reactions  . Aspirin Shortness Of Breath  . Nsaids Shortness Of Breath and Anaphylaxis    Throat swelling, shortness of breath  . Sulfa Antibiotics Other (See Comments)    Stomach pain Stomach pain   . Codeine Other (See Comments)    Short of breath   . Ciprofloxacin Hives  . Penicillins     Has patient had a PCN reaction causing immediate rash, facial/tongue/throat swelling, SOB or lightheadedness with hypotension: Unknown Has patient had a PCN reaction causing severe rash involving mucus membranes or skin necrosis: Unknown Has patient had a PCN reaction that required hospitalization: Unknown Has patient had a PCN reaction occurring within the last 10 years: Unknown If all of the above answers are "NO", then may proceed with Cephalosporin  use.   Marland Kitchen Phenol     Other reaction(s): Angioedema (ALLERGY/intolerance), Other (See Comments) Spasms, nerve symptoms Spasms, nerve symptoms   . Promethazine Hcl     VITALS:  Blood pressure 125/67, pulse 72, temperature 97.9 F (36.6 C), resp. rate 19, height 5\' 4"  (1.626 m), weight 56.8 kg, SpO2 94 %.  PHYSICAL EXAMINATION:  GENERAL:  85 y.o.-year-old patient lying in the bed with no acute distress.   frail. EYES: Pupils equal, round, reactive to light . No scleral icterus. Extraocular muscles intact.  HEENT: Head atraumatic, normocephalic. Oropharynx and nasopharynx clear.  NECK:  Supple, no jugular venous distention. No thyroid enlargement, no tenderness.  LUNGS: Normal breath sounds bilaterally, no wheezing, rales,rhonchi or crepitation. No use of accessory muscles of respiration.  CARDIOVASCULAR: S1, S2 normal. No murmurs, rubs, or gallops.  ABDOMEN: Soft, nontender, nondistended. Bowel sounds present. No organomegaly or mass.  EXTREMITIES: No pedal edema, cyanosis, or clubbing.  NEUROLOGIC:  No  Focal neurological deficit. LABORATORY PANEL:   CBC Recent Labs  Lab 09/19/18 0824  WBC 10.6*  HGB 11.5*  HCT 37.1  PLT 334   ------------------------------------------------------------------------------------------------------------------  Chemistries  Recent Labs  Lab 09/14/18 2329  09/17/18 0225  NA 135   < > 138  K 3.9   < > 4.4  CL 98   < > 103  CO2 26   < > 24  GLUCOSE 115*   < > 112*  BUN 19   < > 31*  CREATININE 0.51   < > 0.69  CALCIUM 8.8*   < > 9.0  MG  --    < >  1.9  AST 23  --   --   ALT 14  --   --   ALKPHOS 73  --   --   BILITOT 1.4*  --   --    < > = values in this interval not displayed.   ------------------------------------------------------------------------------------------------------------------  Cardiac Enzymes Recent Labs  Lab 09/17/18 0751  TROPONINI 0.08*    ------------------------------------------------------------------------------------------------------------------  RADIOLOGY:  No results found.  EKG:   Orders placed or performed during the hospital encounter of 09/14/18  . ED EKG 12-Lead  . ED EKG 12-Lead  . EKG 12-Lead  . EKG 12-Lead  . EKG 12-Lead  . EKG 12-Lead    ASSESSMENT AND PLAN:   #1. acute on chronic respiratory failure with hypoxia secondary to possible aspiration pneumonia, extubated, transferred to medical floor from ICU, clinically doing better, leukocytosis improved, continue aspiration precautions, started on dysphagia 1 diet by speech therapy.  , #2, A. fib with RVR, continue metoprolol 25 mg p.o. twice daily, improved 3 .bilateral pneumonia, patient likely had aspiration, subsequent nonproductive food particles in the emergency room. 4.  Mild dementia:  speech therapy recommends dysphagia 1 diet.  CODE STATUS DNR .  #5.aughter-in-law mentioned that before she came to hospital she was sitting on the side of the bed and also lives in the kitchen, very independent of activities of daily living, want her to be at baseline before discharge she uses walker, cane to ambulate around the house. Plan; continue IV antibiotics, physical therapy to see the patient, will discharge home tomorrow or day after with home health. All the records are reviewed and case discussed with Care Management/Social Workerr. Management plans discussed with the patient, family and they are in agreement.  CODE STATUS: DNR  TOTAL TIME TAKING CARE OF THIS PATIENT: 35minutes.   POSSIBLE D/C IN 1-2 DAYS, DEPENDING ON CLINICAL CONDITION.   Epifanio Lesches M.D on 09/19/2018 at 11:45 AM  Between 7am to 6pm - Pager - [phone]  After 6pm go to [email or web address] - password [email or web address] [place] [facility]  Office  [phone]  CC: Primary care physician; Baxter Hire, MD   Note: This dictation was prepared with Dragon  dictation along with smaller phrase technology. Any transcriptional errors that result from this process are unintentional.

## 2018-09-19 NOTE — Plan of Care (Signed)
Discussed with patient and family plan of care for the evening, pain management and turning in the bed for comfort with some teach back displayed for the family and reinforcement needed for the patient.

## 2018-09-19 NOTE — Plan of Care (Signed)
Discussed with patient and family plan of care for the evening, pain management and aspiration precautions with some evidence of learning.

## 2018-09-19 NOTE — Progress Notes (Signed)
Speech Language Pathology Treatment: Dysphagia  Patient Details Name: Janice Hoffman MRN: [id number] DOB: 1926/07/26 Today's Date: 09/19/2018 Time: 0905-1000 SLP Time Calculation (min) (ACUTE ONLY): 55 min  Assessment / Plan / Recommendation Clinical Impression  Pt seen for ongoing assessment of toleration of diet; trials to upgrade diet to thin liquids as pt's family member stated pt is "requesting water". Noted many of the Nectar consistency drinks were sitting by the window of her room; she had a Nectar liquid w/ her breakfast meal on the tray table, and per family member. Educated family member on the need to offer pt the Nectar liquids to drink for her thirst and her hydration; ice bucket provided for drinks. Pt appeared min more alert to task; she is min HOH and continues to exhibit slower motor responses and slower Cognitive processing - she does have dx of Dementia baseline which could be expected to be exacerbated during this time of illness, hospitalization.  Pt positioned upright w/ cues on need for upright positioning. She was presented 4 ice chip trials which she appeared to tolerate adequately w/ no overt s/s of aspiration during/post intake; slower oral movements for bolus manipulation and clearing. Pt was then given Cup sip trials of thin liquids - noted pt to take small, single sips. Overt s/s of aspiration were noted c/b immediate and/or delayed cough x3/7 trials. Pt also exhibits audible swallowing but this is baseline per family members.  Despite aspiration precautions being utilized(pt followed appropriately), pt continues to present w/ increased risk for aspiration secondary to oropharyngeal phase dysphagia(suspect delayed pharyngeal swallowing initiation). This could impact pt's Pulmonary status. Education was had w/ family member present re: aspiration precautions; Dysphagia in light of advanced age, Dementia, Esophageal dysmotility, and illness; potential impact on the Pulmonary  status if aspiration occurs. Recommend given to continue current Dysphagia diet w/ NECTAR consistency liquids in order to lessen risk for aspiration while medical status continues to improve. ST services will f/u w/ trials to upgrade diet next 1-2 days in order to achieve least restrictive diet consistency safest for pt. Recommended family continue to talk w/ Palliative Care services re: Dysphagia and complications of such to include aspiration pneumonia. Recommend Dietician f/u for support. [name] updated.     HPI HPI: Pt is a 85 y.o. female  with past medical history of mild Dementia, COPD, HTN, and neuralgia admitted on 09/14/2018 with AMS and respiratory distress. Patient was intubated in ED on 09/15/2017.  She was diagnosed with sepsis and acute on chronic hypoxic respiratory failure secondary to bilateral pna and aspiration. Food particles were suctioned from ET tube after intubation. According to family, patient had not eaten in 3 days. Patient also diagnosed with esophageal dysmotility in past. Per Dr. Patsey Berthold, patient's CT scans as far back as 2017 reveal evidence of esophageal dilation. Patient was extubated 09/16/2017. Patient unable to tolerate feeding tube insertion w/ NG "coiling" in the distal Esophagus.       SLP Plan  Continue with current plan of care       Recommendations  Diet recommendations: Dysphagia 1 (puree);Nectar-thick liquid(not wearing full dentures yet) Liquids provided via: Cup;No straw Medication Administration: Crushed with puree(for safer swallowing) Supervision: Patient able to self feed;Full supervision/cueing for compensatory strategies(w/ support) Compensations: Minimize environmental distractions;Slow rate;Small sips/bites;Lingual sweep for clearance of pocketing;Follow solids with liquid;Multiple dry swallows after each bite/sip Postural Changes and/or Swallow Maneuvers: Seated upright 90 degrees;Upright 30-60 min after meal  General  recommendations: (Palliative Care f/u; Dietician f/u) Oral Care Recommendations: Oral care BID;Staff/trained caregiver to provide oral care Follow up Recommendations: (TBD) SLP Visit Diagnosis: Dysphagia, oropharyngeal phase (R13.12) Plan: Continue with current plan of care       [place], [place], CCC-SLP Janice Hoffman 09/19/2018, 10:42 AM

## 2018-09-20 MED ORDER — METHOCARBAMOL 500 MG PO TABS
500.0000 mg | ORAL_TABLET | Freq: Once | ORAL | Status: AC
  Administered 2018-09-21: 500 mg via ORAL
  Filled 2018-09-20: qty 1

## 2018-09-20 MED ORDER — IPRATROPIUM-ALBUTEROL 0.5-2.5 (3) MG/3ML IN SOLN
3.0000 mL | Freq: Three times a day (TID) | RESPIRATORY_TRACT | Status: DC
  Administered 2018-09-20 – 2018-09-22 (×7): 3 mL via RESPIRATORY_TRACT
  Filled 2018-09-20 (×8): qty 3

## 2018-09-20 NOTE — NC FL2 (Signed)
[place] LEVEL OF CARE SCREENING TOOL     IDENTIFICATION  Patient Name: Janice Hoffman Birthdate: 1926-04-23 Sex: female Admission Date (Current Location): 09/14/2018  [place] and [place] Number:  Engineering geologist and Address:  [facility], [address] [place], [place] [address]      Provider Number: [id number]  Attending Physician Name and Address:  Epifanio Lesches, MD  Relative Name and Phone Number:  Kendallyn Lippold (son) [phone] or Avrielle Fry (DIL) [phone]    Current Level of Care: Hospital Recommended Level of Care: [place] Prior Approval Number:    Date Approved/Denied:   PASRR Number: [id number]  Discharge Plan: SNF    Current Diagnoses: Patient Active Problem List   Diagnosis Date Noted  . Acute respiratory distress   . Dementia without behavioral disturbance ([place])   . Pneumonia of both lower lobes due to infectious organism ([place])   . Palliative care by specialist   . Goals of care, counseling/discussion   . Protein-calorie malnutrition, severe 09/16/2018  . Sepsis due to pneumonia ([place]) 09/15/2018  . Sepsis ([place]) 07/18/2018  . Acute exacerbation of chronic obstructive pulmonary disease (COPD) ([place]) 03/14/2018  . Acute on chronic respiratory failure with hypoxemia ([place]) 02/22/2018  . CAP (community acquired pneumonia) 08/18/2017  . New onset a-fib ([place]) 06/15/2017  . Near syncope 05/31/2017  . COPD with acute exacerbation ([place]) 01/05/2017  . Essential hypertension, malignant 01/05/2017  . Dysphagia 01/05/2017  . COPD exacerbation ([place]) 03/15/2016  . Osteoarthritis 10/12/2014  . Osteoporosis, post-menopausal 10/12/2014  . Pernicious anemia 06/07/2014  . Vitamin D deficiency 06/07/2014  . Post-traumatic osteoarthritis of left knee 05/24/2014  . Pain in joint, lower leg 08/03/2013  . 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 ELEVATED BLOOD PRESSURE 05/12/2008  . HERPES ZOSTER  04/26/2008  . B12 DEFICIENCY 04/26/2008  . CONSTIPATION 04/26/2008  . LEG EDEMA, BILATERAL 04/26/2008    Orientation RESPIRATION BLADDER Height & Weight     Self, Place, Situation  Normal Incontinent Weight: 125 lb (56.7 kg) Height:  5\' 4"  (162.6 cm)  BEHAVIORAL SYMPTOMS/MOOD NEUROLOGICAL BOWEL NUTRITION STATUS      Continent Diet(Dysphagia 1; nectar thick)  AMBULATORY STATUS COMMUNICATION OF NEEDS Skin   Extensive Assist Verbally Normal                       Personal Care Assistance Level of Assistance  Bathing, Feeding, Dressing Bathing Assistance: Limited assistance Feeding assistance: Independent Dressing Assistance: Limited assistance     Functional Limitations Info  Sight, Hearing, Speech Sight Info: Adequate Hearing Info: Impaired Speech Info: Adequate    SPECIAL CARE FACTORS FREQUENCY  PT (By licensed PT), OT (By licensed OT)     PT Frequency: Up to 5X per week OT Frequency: Up to 3X per week            Contractures Contractures Info: Not present    Additional Factors Info  Code Status, Allergies Code Status Info: DNR Allergies Info: Aspirin, Nsaids, Sulfa Antibiotics, Codeine, Ciprofloxacin, Penicillins, Phenol, Promethazine Hcl           Current Medications (09/20/2018):  This is the current hospital active medication list Current Facility-Administered Medications  Medication Dose Route Frequency Provider Last Rate Last Dose  . acetaminophen (TYLENOL) tablet 650 mg  650 mg Oral Q6H PRN Tyler Pita, MD   650 mg at 09/20/18 [phone]  . albuterol (PROVENTIL) (2.5 MG/3ML) 0.083% nebulizer solution 2.5 mg  2.5 mg Nebulization  Q3H PRN Tyler Pita, MD      . ampicillin-sulbactam (UNASYN) 1.5 g in sodium chloride 0.9 % 100 mL IVPB  1.5 g Intravenous Q6H Tyler Pita, MD 200 mL/hr at 09/20/18 1512 1.5 g at 09/20/18 1512  . bisacodyl (DULCOLAX) suppository 10 mg  10 mg Rectal Daily PRN Awilda Bill, NP   10 mg at 09/18/18 1524  .  budesonide (PULMICORT) nebulizer solution 0.5 mg  0.5 mg Nebulization BID Arta Silence, MD   0.5 mg at 09/20/18 0753  . enoxaparin (LOVENOX) injection 40 mg  40 mg Subcutaneous Q24H Arta Silence, MD   40 mg at 09/19/18 2131  . hydrALAZINE (APRESOLINE) injection 10-20 mg  10-20 mg Intravenous Q4H PRN Darel Hong D, NP   10 mg at 09/19/18 0448  . ipratropium-albuterol (DUONEB) 0.5-2.5 (3) MG/3ML nebulizer solution 3 mL  3 mL Nebulization TID Epifanio Lesches, MD   3 mL at 09/20/18 1409  . MEDLINE mouth rinse  15 mL Mouth Rinse BID Awilda Bill, NP   15 mL at 09/20/18 9702  . metoprolol tartrate (LOPRESSOR) injection 2.5-5 mg  2.5-5 mg Intravenous Q6H PRN Darel Hong D, NP   5 mg at 09/16/18 2119  . metoprolol tartrate (LOPRESSOR) tablet 25 mg  25 mg Oral BID Tyler Pita, MD   25 mg at 09/20/18 0901  . polyethylene glycol (MIRALAX / GLYCOLAX) packet 17 g  17 g Oral Daily PRN Tyler Pita, MD      . traMADol Veatrice Bourbon) tablet 50 mg  50 mg Oral Q12H Tyler Pita, MD   50 mg at 09/20/18 0901     Discharge Medications: Please see discharge summary for a list of discharge medications.  Relevant Imaging Results:  Relevant Lab Results:   Additional Information SS#[id number]  Zettie Pho, LCSW

## 2018-09-20 NOTE — Plan of Care (Signed)
Discussed with patient and family plan of care for the evening, pain management and clustering care to promote more sleep periods with some teach back displayed

## 2018-09-20 NOTE — Progress Notes (Signed)
[facility] NAME: Kamaria Lucia    MR#:  [id number]  DATE OF BIRTH:  August 17, 1926  Admitted for pneumonia, found to have A. fib with RVR, transferred to floor yesterday, denies any chest pain, shortness of breath, according to patient's son she is feeling much better and stronger than before, looking forward to go to rehab tomorrow.  CHIEF COMPLAINT:   Chief Complaint  Patient presents with  . Fever    REVIEW OF SYSTEMS:   Review of Systems  Constitutional: Negative for chills and fever.  HENT: Negative for hearing loss.   Eyes: Negative for blurred vision, double vision and photophobia.  Respiratory: Negative for cough, hemoptysis and shortness of breath.   Cardiovascular: Negative for palpitations, orthopnea and leg swelling.  Gastrointestinal: Negative for abdominal pain, diarrhea and vomiting.  Genitourinary: Negative for dysuria and urgency.  Musculoskeletal: Negative for myalgias and neck pain.  Skin: Negative for rash.  Neurological: Negative for dizziness, focal weakness, seizures, weakness and headaches.  Psychiatric/Behavioral: Negative for memory loss. The patient does not have insomnia.      DRUG ALLERGIES:   Allergies  Allergen Reactions  . Aspirin Shortness Of Breath  . Nsaids Shortness Of Breath and Anaphylaxis    Throat swelling, shortness of breath  . Sulfa Antibiotics Other (See Comments)    Stomach pain Stomach pain   . Codeine Other (See Comments)    Short of breath   . Ciprofloxacin Hives  . Penicillins     Has patient had a PCN reaction causing immediate rash, facial/tongue/throat swelling, SOB or lightheadedness with hypotension: Unknown Has patient had a PCN reaction causing severe rash involving mucus membranes or skin necrosis: Unknown Has patient had a PCN reaction that required hospitalization: Unknown Has patient had a PCN reaction occurring within the last 10 years: Unknown If all  of the above answers are "NO", then may proceed with Cephalosporin use.   Marland Kitchen Phenol     Other reaction(s): Angioedema (ALLERGY/intolerance), Other (See Comments) Spasms, nerve symptoms Spasms, nerve symptoms   . Promethazine Hcl     VITALS:  Blood pressure (!) 167/85, pulse 84, temperature 98.1 F (36.7 C), resp. rate 17, height 5\' 4"  (1.626 m), weight 56.7 kg, SpO2 98 %.  PHYSICAL EXAMINATION:  GENERAL:  85 y.o.-year-old patient lying in the bed with no acute distress.   frail. EYES: Pupils equal, round, reactive to light . No scleral icterus. Extraocular muscles intact.  HEENT: Head atraumatic, normocephalic. Oropharynx and nasopharynx clear.  NECK:  Supple, no jugular venous distention. No thyroid enlargement, no tenderness.  LUNGS: Normal breath sounds bilaterally, no wheezing, rales,rhonchi or crepitation. No use of accessory muscles of respiration.  CARDIOVASCULAR: S1, S2 normal. No murmurs, rubs, or gallops.  ABDOMEN: Soft, nontender, nondistended. Bowel sounds present. No organomegaly or mass.  EXTREMITIES: No pedal edema, cyanosis, or clubbing.  NEUROLOGIC:  No  Focal neurological deficit. LABORATORY PANEL:   CBC Recent Labs  Lab 09/19/18 0824  WBC 10.6*  HGB 11.5*  HCT 37.1  PLT 334   ------------------------------------------------------------------------------------------------------------------  Chemistries  Recent Labs  Lab 09/14/18 2329  09/17/18 0225  NA 135   < > 138  K 3.9   < > 4.4  CL 98   < > 103  CO2 26   < > 24  GLUCOSE 115*   < > 112*  BUN 19   < > 31*  CREATININE 0.51   < > 0.69  CALCIUM  8.8*   < > 9.0  MG  --    < > 1.9  AST 23  --   --   ALT 14  --   --   ALKPHOS 73  --   --   BILITOT 1.4*  --   --    < > = values in this interval not displayed.   ------------------------------------------------------------------------------------------------------------------  Cardiac Enzymes Recent Labs  Lab 09/17/18 0751  TROPONINI 0.08*    ------------------------------------------------------------------------------------------------------------------  RADIOLOGY:  No results found.  EKG:   Orders placed or performed during the hospital encounter of 09/14/18  . ED EKG 12-Lead  . ED EKG 12-Lead  . EKG 12-Lead  . EKG 12-Lead  . EKG 12-Lead  . EKG 12-Lead    ASSESSMENT AND PLAN:   #1. acute on chronic respiratory failure with hypoxia secondary to possible aspiration pneumonia, extubated, transferred to medical floor from ICU, clinically doing better, leukocytosis improved, continue aspiration precautions, started on dysphagia 1 diet by speech therapy.  Continue Unasyn today. , #2, A. fib with RVR, continue metoprolol 25 mg p.o. twice daily, improved 3 .bilateral pneumonia, patient likely had aspiration, subsequent nonproductive food particles in the emergency room.  Dysphagia 1 diet. 4.  Mild dementia:  speech therapy recommends dysphagia 1 diet.  CODE STATUS DNR .  #5 discussed with patient's son.  Disposition likely rehab tomorrow.  All the records are reviewed and case discussed with Care Management/Social Workerr. Management plans discussed with the patient, family and they are in agreement.  CODE STATUS: DNR  TOTAL TIME TAKING CARE OF THIS PATIENT: 69minutes.   POSSIBLE D/C IN 1-2 DAYS, DEPENDING ON CLINICAL CONDITION.   Epifanio Lesches M.D on 09/20/2018 at 11:35 AM  Between 7am to 6pm - Pager - [phone]  After 6pm go to [email or web address] - password [email or web address] [facility]  Office  [phone]  CC: Primary care physician; Baxter Hire, MD   Note: This dictation was prepared with Dragon dictation along with smaller phrase technology. Any transcriptional errors that result from this process are unintentional.

## 2018-09-21 MED ORDER — SODIUM CHLORIDE 0.9 % IV SOLN
1.5000 g | Freq: Four times a day (QID) | INTRAVENOUS | Status: DC
  Administered 2018-09-21 – 2018-09-22 (×5): 1.5 g via INTRAVENOUS
  Filled 2018-09-21 (×7): qty 1.5

## 2018-09-21 MED ORDER — ALBUTEROL SULFATE (2.5 MG/3ML) 0.083% IN NEBU: 2.5000 mg | INHALATION_SOLUTION | RESPIRATORY_TRACT | 12 refills | Status: DC | PRN

## 2018-09-21 NOTE — Care Management Important Message (Signed)
Important Message  Patient Details  Name: Janice Hoffman MRN: [id number] Date of Birth: October 17, 1925   Medicare Important Message Given:  Yes    [name] A Cinthya Bors 09/21/2018, 11:10 AM

## 2018-09-21 NOTE — Progress Notes (Signed)
New referral for outpatient Palliative to follow at [facility] SNF/STR received from [facility]. Plan is for discharge tomorrow. Patient information faxed to referral. Thank you. Flo Shanks RN, BSN, [facility] [place], hospital liaison [phone]

## 2018-09-21 NOTE — Progress Notes (Signed)
Patient had complaints of left knee pain after receiving pain medications.  Tried to rub the leg with warm lotion to help but with no relief.  Paged MD for muscle rub cream but contraindicated at this time.  Given a one-time dose of a muscle relaxant to help.  Warm blankets, ice and music used to assist.  Stayed with patient to she was more comfortable and/or fell asleep.

## 2018-09-21 NOTE — Progress Notes (Signed)
Physical Therapy Treatment Patient Details Name: Janice Hoffman MRN: [id number] DOB: November 25, 1925 Today's Date: 09/21/2018    History of Present Illness  85 yo female who was admitted for sepsis, PNA, and noted pleural effusion, was on O2 at baseline and walking only short trips on RW.  Pt was demonstrating SIRS symptoms, had elevation of troponin but now more alert.  Pt self extubated NG tube.  PMHx:  AMS, UTI sepsis, COPD, mild dementia, HTN, neuralgia, hypoxemia, aspiration PNA,      PT Comments    Pt able to stand with mod assist up to RW but limited ability to ambulate d/t posterior lean requiring mod assist to maintain upright posture.  Pt requesting to toilet and requiring mod to max assist x1 to perform stand pivot bed to/from [facility].  No report of any pain throughout PT session.  Pt motivated to improve strength and regain independence.  Will continue to focus on strengthening, balance, and progressive functional mobility per pt tolerance.    Follow Up Recommendations  SNF     Equipment Recommendations  Rolling walker with 5" wheels    Recommendations for Other Services       Precautions / Restrictions Precautions Precautions: Fall Precaution Comments: aspiration Restrictions Weight Bearing Restrictions: No    Mobility  Bed Mobility Overal bed mobility: Needs Assistance Bed Mobility: Supine to Sit;Sit to Supine     Supine to sit: Mod assist Sit to supine: Mod assist   General bed mobility comments: assist for LE's and trunk semi-supine to sit; assist for LE's sit to supine; 2 assist to boost pt up in bed  Transfers Overall transfer level: Needs assistance Equipment used: Rolling walker (2 wheeled);None Transfers: Sit to/from [facility] Sit to Stand: Mod assist Stand pivot transfers: Mod assist;Max assist       General transfer comment: mod assist to initiate stand up to RW (posterior lean noted in standing requiring mod assist to maintain  standing balance even with vc's for technique); stand pivot bed to/from [facility] with mod to max assist x1  Ambulation/Gait Ambulation/Gait assistance: Mod assist Gait Distance (Feet): (B LE marching in place x2 reps B LE's) Assistive device: Rolling walker (2 wheeled)   Gait velocity: decreased   General Gait Details: narrow BOS; posterior lean requiring mod assist to maintain upright posture   Stairs             Wheelchair Mobility    Modified Rankin (Stroke Patients Only)       Balance Overall balance assessment: Needs assistance Sitting-balance support: No upper extremity supported;Feet supported Sitting balance-Leahy Scale: Good Sitting balance - Comments: steady sitting reaching within BOS   Standing balance support: Bilateral upper extremity supported Standing balance-Leahy Scale: Poor Standing balance comment: posterior lean in standing with RW use requiring mod assist to maintain upright posture                            Cognition Arousal/Alertness: Awake/alert Behavior During Therapy: WFL for tasks assessed/performed Overall Cognitive Status: Within Functional Limits for tasks assessed                                        Exercises      General Comments   Pt agreeable to PT session.      Pertinent Vitals/Pain Pain Assessment: No/denies pain  Vitals (HR  and O2 on 4 L O2 via nasal cannula) stable and WFL throughout treatment session.    Home Living                      Prior Function            PT Goals (current goals can now be found in the care plan section) Acute Rehab PT Goals Patient Stated Goal: to improve strength and walking PT Goal Formulation: With patient Time For Goal Achievement: 10/03/18 Potential to Achieve Goals: Good Progress towards PT goals: Progressing toward goals    Frequency    Min 2X/week      PT Plan Current plan remains appropriate    Co-evaluation               AM-PAC PT "6 Clicks" Mobility   Outcome Measure  Help needed turning from your back to your side while in a flat bed without using bedrails?: A Lot Help needed moving from lying on your back to sitting on the side of a flat bed without using bedrails?: A Lot Help needed moving to and from a bed to a chair (including a wheelchair)?: A Lot Help needed standing up from a chair using your arms (e.g., wheelchair or bedside chair)?: A Lot Help needed to walk in hospital room?: Total Help needed climbing 3-5 steps with a railing? : Total 6 Click Score: 10    End of Session Equipment Utilized During Treatment: Oxygen;Gait belt Activity Tolerance: Patient tolerated treatment well Patient left: in bed;with call bell/phone within reach;with bed alarm set;with nursing/sitter in room Nurse Communication: Mobility status;Precautions PT Visit Diagnosis: Unsteadiness on feet (R26.81);Muscle weakness (generalized) (M62.81);Difficulty in walking, not elsewhere classified (R26.2);Adult, failure to thrive (R62.7)     Time: 4707-6151 PT Time Calculation (min) (ACUTE ONLY): 25 min  Charges:  $Therapeutic Activity: 23-37 mins                    Leitha Bleak, PT 09/21/18, 4:20 PM [phone]

## 2018-09-21 NOTE — Progress Notes (Signed)
Son called this morning and updated him on the events with Mrs. Gonce last night and informed the family she is using a k-pad at this time for her knee.

## 2018-09-21 NOTE — Clinical Social Work Note (Signed)
Clinical Social Work Assessment  Patient Details  Name: Janice Hoffman MRN: [id number] Date of Birth: 1925-11-23  Date of referral:  09/21/18               Reason for consult:  Facility Placement                Permission sought to share information with:  Chartered certified accountant granted to share information::  Yes, Verbal Permission Granted  Name::      [place]::   [place]   Relationship::     Contact Information:     Housing/Transportation Living arrangements for the past 2 months:  [place] of Information:  Adult Children Patient Interpreter Needed:  None Criminal Activity/Legal Involvement Pertinent to Current Situation/Hospitalization:  No - Comment as needed Significant Relationships:  Adult Children Lives with:  Adult Children Do you feel safe going back to the place where you live?  Yes Need for family participation in patient care:  Yes (Comment)  Care giving concerns:  Patient lives in [place] with her son Dr. Grant Fontana.    Social Worker assessment / plan:  Holiday representative (CSW) reviewed chart and noted that PT is recommending SNF. Per chart patient is not alert and oriented so CSW contacted patient's son [name]. Per son patient lives with him in [place]. CSW explained SNF process and that [facility] will have to approve it. Son is agreeable to SNF search in [place]. FL2 complete and faxed out. Per son he is planning on hiring private duty caregivers to stay with patient a few hours during the day at the SNF.   CSW presented bed offers to son and he chose [facility]. Per Seth Bake admissions coordinator at [facility] she will start [facility] SNF authorization today. Patient will have outpatient palliative to follow at [facility]. [facility] liaison is aware of above. CSW will continue to follow and assist as needed.    Employment status:  Disabled (Comment on whether or not currently receiving  Disability), Retired Nurse, adult PT Recommendations:  [place] / Referral to community resources:  [place]  Patient/Family's Response to care:  Patient's son chose [facility].   Patient/Family's Understanding of and Emotional Response to Diagnosis, Current Treatment, and Prognosis:  Patient's son was very pleasant and thanked CSW for assistance.    Emotional Assessment Appearance:  Appears stated age Attitude/Demeanor/Rapport:  Unable to Assess Affect (typically observed):  Unable to Assess Orientation:  Oriented to Self, Oriented to Place, Fluctuating Orientation (Suspected and/or reported Sundowners) Alcohol / Substance use:  Not Applicable Psych involvement (Current and /or in the community):  No (Comment)  Discharge Needs  Concerns to be addressed:  Discharge Planning Concerns Readmission within the last 30 days:  No Current discharge risk:  Dependent with Mobility Barriers to Discharge:  Continued Medical Work up   [employer], Veronia Beets, LCSW 09/21/2018, 3:06 PM

## 2018-09-21 NOTE — Discharge Summary (Signed)
Janice Hoffman, is a 85 y.o. female  DOB 1926-07-22  MRN [id number].  Admission date:  09/14/2018  Admitting Physician  Arta Silence, MD  Discharge Date:  09/21/2018   Primary MD  Baxter Hire, MD  Recommendations for primary care physician for things to follow:   Discharge to rehab   Admission Diagnosis  Pneumonia of both lower lobes due to infectious organism [facility]) [J18.1] Sepsis, due to unspecified organism, unspecified whether acute organ dysfunction present [facility]) [A41.9]   Discharge Diagnosis  Pneumonia of both lower lobes due to infectious organism ([place]) [J18.1] Sepsis, due to unspecified organism, unspecified whether acute organ dysfunction present [facility]) [A41.9]    Active Problems:   Sepsis due to pneumonia ([place])   Protein-calorie malnutrition, severe   Pneumonia of both lower lobes due to infectious organism [facility])   Palliative care by specialist   Goals of care, counseling/discussion   Acute respiratory distress   Dementia without behavioral disturbance ([place])      Past Medical History:  Diagnosis Date  . Asthma   . COPD (chronic obstructive pulmonary disease) ([place])   . Herpes zoster   . Hypertension   . Neuralgia     Past Surgical History:  Procedure Laterality Date  . LEG SURGERY         History of present illness and  Hospital Course:     Kindly see H&P for history of present illness and admission details, please review complete Labs, Consult reports and Test reports for all details in brief  HPI  from the history and physical done on the day of admission 85 year old female patient admitted for sepsis, pneumonia, initially admitted to intensive care unit, requiring vent support  Hospital Course  #1 ./sepsis with evidence of elevated lactic acid, present on admission, secondary to  pneumonia, and received cefepime, vancomycin, doxycycline on admission.  Patient had aspiration event, patient had suctioning in the emergency room by respiratory and suctioned a lot of food particles, admitted to ICU, intubated.  Patient got extubated, transferred to regular floor.  Patient is not hypoxic anymore, O2 saturation 97% on room air.  Afebrile.  Hemodynamically stable.  Received Unasyn for aspiration event, patient is on antibiotics since admission on January 6. Patient had leukocytosis with white count up to 15.7 decreased to 10.6. No further need of antibiotics.  Continue strict aspiration precautions, seen by speech therapy, recommended dysphagia 1 diet with nectar thick liquids, no straws, meds crushed in pure, patient is at high risk for aspiration due to advanced age.  #2 .deconditioning, physical therapy recommended SNF placement, patient came from home, initially she was very weak but now she is looking much better, hopefully she can go home, son wants to take her home after she gets stronger at rehab.  Discussed with patient's son, who is agreeable for rehab, patient is stable for discharge to rehab today. #3. paroxysmal atrial fibrillation: Improved with Cardizem drip, Lasix, #4 .  Alzheimer's dementia; #5. severe malnutrition in the context of chronic illness, seen by nutritionis 6. left  knee pain, patient has chronic left  knee pain, continue tramadol, Tylenol PM.  #7 .COPD: Discharge Condition:  #8 metabolic encephalopathy patient had altered mental status, fever, pneumonia which improved, she is alert, awake, oriented. Follow UP      Discharge Instructions  and  Discharge Medications      Allergies as of 09/21/2018      Reactions   Aspirin Shortness Of Breath   Nsaids Shortness Of  Breath, Anaphylaxis   Throat swelling, shortness of breath   Sulfa Antibiotics Other (See Comments)   Stomach pain Stomach pain   Codeine Other (See Comments)   Short of breath    Ciprofloxacin Hives   Penicillins    Has patient had a PCN reaction causing immediate rash, facial/tongue/throat swelling, SOB or lightheadedness with hypotension: Unknown Has patient had a PCN reaction causing severe rash involving mucus membranes or skin necrosis: Unknown Has patient had a PCN reaction that required hospitalization: Unknown Has patient had a PCN reaction occurring within the last 10 years: Unknown If all of the above answers are "NO", then may proceed with Cephalosporin use.   Phenol    Other reaction(s): Angioedema (ALLERGY/intolerance), Other (See Comments) Spasms, nerve symptoms Spasms, nerve symptoms   Promethazine Hcl       Medication List    TAKE these medications   acetaminophen 650 MG CR tablet Commonly known as:  TYLENOL Take 650 mg by mouth every 8 (eight) hours as needed for pain. What changed:  Another medication with the same name was removed. Continue taking this medication, and follow the directions you see here.   albuterol (2.5 MG/3ML) 0.083% nebulizer solution Commonly known as:  PROVENTIL Take 3 mLs (2.5 mg total) by nebulization every 3 (three) hours as needed for wheezing or shortness of breath.   cyanocobalamin 1000 MCG/ML injection Commonly known as:  (VITAMIN B-12) Inject 1,000 mcg into the skin every 30 (thirty) days.   feeding supplement (ENSURE ENLIVE) Liqd Take 237 mLs by mouth 2 (two) times daily between meals.   Fluticasone-Salmeterol 500-50 MCG/DOSE Aepb Commonly known as:  ADVAIR Inhale 1 puff into the lungs 2 (two) times daily.   lisinopril 5 MG tablet Commonly known as:  PRINIVIL,ZESTRIL Take 1 tablet (5 mg total) by mouth daily.   metoprolol tartrate 25 MG tablet Commonly known as:  LOPRESSOR Take 1 tablet (25 mg total) by mouth 2 (two) times daily.   polyethylene glycol packet Commonly known as:  MIRALAX / GLYCOLAX Take 17 g by mouth daily as needed.   tiotropium 18 MCG inhalation capsule Commonly known as:   SPIRIVA HANDIHALER Place 1 capsule (18 mcg total) into inhaler and inhale daily.   traMADol 50 MG tablet Commonly known as:  ULTRAM Take 50 mg by mouth 2 (two) times daily.         Diet and Activity recommendation: See Discharge Instructions above   Consults obtained -intensivist, physical therapy   Major procedures and Radiology Reports - PLEASE review detailed and final reports for all details, in brief -     Dg Chest 1 View  Result Date: 09/17/2018 CLINICAL DATA:  Acute respiratory distress. EXAM: CHEST  1 VIEW COMPARISON:  Radiographs 09/15/2018 FINDINGS: Endotracheal and enteric tubes have been removed. Patchy bibasilar opacities are similar to prior exam. Slight improvement in left pleural effusion. Unchanged heart size and mediastinal contours. No pulmonary edema or pneumothorax. Scoliotic curvature of spine. IMPRESSION: Patchy bibasilar opacities are similar to prior exam. Slight improvement in left pleural effusion. Electronically Signed   By: Keith Rake M.D.   On: 09/17/2018 02:11   Dg Chest 2 View  Result Date: 09/14/2018 CLINICAL DATA:  Fever, possible sepsis EXAM: CHEST - 2 VIEW COMPARISON:  07/18/2018 FINDINGS: Bibasilar infiltrates and small pleural effusions. Stable cardiomediastinal silhouette with aortic atherosclerosis. No pneumothorax. Scoliosis and degenerative changes of the spine. IMPRESSION: Small pleural effusions with bibasilar infiltrates. Electronically Signed   By: Madie Reno.D.  On: 09/14/2018 23:26   Dg Abd 1 View  Result Date: 09/15/2018 CLINICAL DATA:  OG tube placement. EXAM: ABDOMEN - 1 VIEW COMPARISON:  One-view abdomen 12/15/2017 FINDINGS: Side port of an OG tube is in the stomach. The stomach is decompressed. Gas is present in the proximal colon. There is moderate stool throughout the distal colon. There is no obstruction or free air. Levoconvex scoliosis of the lumbar spine is again seen. Atherosclerotic calcifications are present.  IMPRESSION: 1. Side port of the OG tube is in the fundus of the stomach. 2. Mild gaseous distention of colon proximally with moderate stool distally. Electronically Signed   By: San Morelle M.D.   On: 09/15/2018 10:33   Dg Chest Port 1 View  Result Date: 09/15/2018 CLINICAL DATA:  Intubation and OG tube placement. Radiographs yesterday. EXAM: PORTABLE CHEST 1 VIEW COMPARISON:  Frontal and lateral views yesterday. FINDINGS: Endotracheal tube tip 4.7 cm from the carina. Enteric tube is in place, tip in the distal esophagus, advancement of least 10 cm recommend. Bibasilar opacities and left pleural effusion are unchanged from prior exam allowing for differences in technique. Unchanged heart size and mediastinal contours with aortic atherosclerosis. There is mild interstitial coarsening. No pneumothorax. Scoliotic curvature of spine. IMPRESSION: 1. Endotracheal tube 4.7 cm from the carina. Enteric tube in place, tip in the distal esophagus, advancement of least 10 cm recommended. 2. Unchanged bibasilar opacities and small left pleural effusion. Unchanged interstitial coarsening. Electronically Signed   By: Keith Rake M.D.   On: 09/15/2018 01:19   Dg Abd Portable 1v  Result Date: 09/16/2018 CLINICAL DATA:  Nasogastric tube placement. EXAM: PORTABLE ABDOMEN - 1 VIEW COMPARISON:  Radiograph of September 15, 2018. FINDINGS: The bowel gas pattern is normal. Distal tip of feeding tube is seen in expected position of distal stomach. No radio-opaque calculi or other significant radiographic abnormality are seen. IMPRESSION: Distal tip of feeding tube seen in expected position of distal stomach. No definite evidence of bowel obstruction or ileus. Electronically Signed   By: Marijo Conception, M.D.   On: 09/16/2018 16:19   Dg Abd Portable 1v  Result Date: 09/15/2018 CLINICAL DATA:  Enteric tube adjustment. EXAM: PORTABLE ABDOMEN - 1 VIEW COMPARISON:  Abdominal x-ray from same day at 1322. FINDINGS: There has  been slight advancement of the enteric tube, with the distal side port now likely just beyond the gastroesophageal junction. Tip remains in the stomach. The bowel gas pattern is unchanged. Stable bibasilar opacities and small left pleural effusion. Endotracheal tube appropriately positioned with the tip 4.7 cm above the carina. IMPRESSION: 1. Slight advancement of the enteric tube with the distal side port now likely just beyond the gastroesophageal junction. Electronically Signed   By: Titus Dubin M.D.   On: 09/15/2018 14:19   Dg Abd Portable 1v  Result Date: 09/15/2018 CLINICAL DATA:  NG tube placement EXAM: PORTABLE ABDOMEN - 1 VIEW COMPARISON:  KUB from earlier today FINDINGS: The NG tube has not changed significantly in position with the tip in the proximal body of the stomach. The opening along the distal portion of the tube lies near the gastroesophageal junction and advancement would be helpful. The bowel gas pattern is nonspecific. Moderate thoracolumbar scoliosis is again noted with diffuse degenerative change. IMPRESSION: 1. Tip of NG tube in the region of the proximal body of the stomach. 2. Opening of the distal portion of the NG tube is near the GE junction. Recommend advancing the tube. Electronically Signed  By: Ivar Drape M.D.   On: 09/15/2018 13:34   Dg Abd Portable 1v  Result Date: 09/15/2018 CLINICAL DATA:  OG tube placement. EXAM: PORTABLE ABDOMEN - 1 VIEW COMPARISON:  Prior exam same day. FINDINGS: OG tube tip and side hole are again noted in the stomach. However the proximal portion of the OG 2 is again noted coiled in the esophagus and repositioning should be considered. IMPRESSION: OG tip and side hole are again noted in the stomach. However the proximal portion of the OG to is again noted coiled in the esophagus. Repositioning should be considered. These results will be called to the ordering clinician or representative by the Radiologist Assistant, and communication documented  in the PACS or zVision Dashboard. Electronically Signed   By: Marcello Moores  Register   On: 09/15/2018 12:46   Dg Abd Portable 1v  Result Date: 09/15/2018 CLINICAL DATA:  85 year old female. Orogastric tube placement. Subsequent encounter. EXAM: PORTABLE ABDOMEN - 1 VIEW COMPARISON:  09/15/2026 12:03 p.m. FINDINGS: Orogastric tube is curled within the distal esophagus. Side hole just beyond expected level of the gastroesophageal junction with tip gastric fundus level. Appearance without significant change since recent exam. Gas-filled slightly prominent size colon. The possibility of free intraperitoneal air cannot be assessed on a supine view. IMPRESSION: 1. Orogastric tube is curled within the distal esophagus. Side hole just beyond expected level of the gastroesophageal junction with tip gastric fundus level. Appearance without significant change since recent exam. 2. Gas-filled slightly prominent size colon. These results will be called to the ordering clinician or representative by the Radiologist Assistant, and communication documented in the PACS or zVision Dashboard. Electronically Signed   By: Genia Del M.D.   On: 09/15/2018 12:29   Dg Abd Portable 1v  Result Date: 09/15/2018 CLINICAL DATA:  OG tube placement. EXAM: PORTABLE ABDOMEN - 1 VIEW COMPARISON:  09/15/2018. FINDINGS: OG tube noted with coiled loops in the distal esophagus. The tip and side hole of the OG tube are in the stomach. Repositioning with straightening of the OG tube should be considered. Bibasilar atelectasis/infiltrates and small pleural effusions. IMPRESSION: OG tube noted with coiled loops in the distal esophagus. The tip and side hole of the NG tube are in the stomach. Repositioning with straightening of the OG tube should be considered. These results will be called to the ordering clinician or representative by the Radiologist Assistant, and communication documented in the PACS or zVision Dashboard. Electronically Signed   By:  Marcello Moores  Register   On: 09/15/2018 12:23    Micro Results     Recent Results (from the past 240 hour(s))  Urine culture     Status: Abnormal   Collection Time: 09/14/18 11:29 PM  Result Value Ref Range Status   Specimen Description   Final    URINE, RANDOM Performed at [facility], [address] [place], [place] [address]    Special Requests   Final    NONE Performed at [facility], [place], [place] [address]    Culture 70,000 COLONIES/mL ENTEROCOCCUS FAECALIS (A)  Final   Report Status 09/17/2018 FINAL  Final   Organism ID, Bacteria ENTEROCOCCUS FAECALIS (A)  Final      Susceptibility   Enterococcus faecalis - MIC*    AMPICILLIN <=2 SENSITIVE Sensitive     LEVOFLOXACIN >=8 RESISTANT Resistant     NITROFURANTOIN <=16 SENSITIVE Sensitive     VANCOMYCIN 1 SENSITIVE Sensitive     * 70,000 COLONIES/mL ENTEROCOCCUS FAECALIS  Culture, blood (Routine x 2)     Status: None   Collection Time: 09/14/18 11:30 PM  Result Value Ref Range Status   Specimen Description BLOOD LEFT ANTECUBITAL  Final   Special Requests   Final    BOTTLES DRAWN AEROBIC AND ANAEROBIC Blood Culture adequate volume   Culture   Final    NO GROWTH 5 DAYS Performed at [facility], [address] [place], [place] [address]    Report Status 09/19/2018 FINAL  Final  Culture, blood (Routine x 2)     Status: None   Collection Time: 09/14/18 11:30 PM  Result Value Ref Range Status   Specimen Description BLOOD BLOOD RIGHT FOREARM  Final   Special Requests   Final    BOTTLES DRAWN AEROBIC AND ANAEROBIC Blood Culture adequate volume   Culture   Final    NO GROWTH 5 DAYS Performed at [facility], [address] [place], [place] [address]    Report Status 09/19/2018 FINAL  Final  MRSA PCR Screening     Status: None   Collection Time: 09/15/18  7:51 AM  Result Value Ref Range Status   MRSA by PCR NEGATIVE NEGATIVE Final    Comment:        The  GeneXpert MRSA Assay (FDA approved for NASAL specimens only), is one component of a comprehensive MRSA colonization surveillance program. It is not intended to diagnose MRSA infection nor to guide or monitor treatment for MRSA infections. Performed at [facility], [address] [place], [place] [address]   Culture, respiratory (non-expectorated)     Status: None   Collection Time: 09/15/18 12:30 PM  Result Value Ref Range Status   Specimen Description   Final    TRACHEAL ASPIRATE Performed at [facility], [address], [place], [place] [address]    Special Requests   Final    NONE Performed at [facility], [place], [place] [address]    Gram Stain NO WBC SEEN NO ORGANISMS SEEN   Final   Culture   Final    NO GROWTH 2 DAYS Performed at [facility], [place] [address] [place], [place] [address]    Report Status 09/17/2018 FINAL  Final       Today   Subjective:   Janice Hoffman today feels much stronger, stable for discharge.  Objective:   Blood pressure (!) 157/86, pulse 76, temperature 98 F (36.7 C), temperature source Oral, resp. rate 18, height 5\' 4"  (1.626 m), weight 56.1 kg, SpO2 97 %.   Intake/Output Summary (Last 24 hours) at 09/21/2018 0913 Last data filed at 09/21/2018 0500 Gross per 24 hour  Intake 780 ml  Output 900 ml  Net -120 ml    Exam Awake Alert, Oriented x 3, No new F.N deficits, Normal affect [place].AT,PERRAL Supple Neck,No JVD, No cervical lymphadenopathy appriciated.  Symmetrical Chest wall movement, Good air movement bilaterally, CTAB RRR,No Gallops,Rubs or new Murmurs, No Parasternal Heave +ve B.Sounds, Abd Soft, Non tender, No organomegaly appriciated, No rebound -guarding or rigidity. No Cyanosis, Clubbing or edema, No new Rash or bruise  Data Review   CBC w Diff:  Lab Results  Component Value Date   WBC 10.6 (H) 09/19/2018   HGB 11.5 (L) 09/19/2018   HGB 14.3 08/26/2014    HCT 37.1 09/19/2018   HCT 42.2 08/26/2014   PLT 334 09/19/2018   PLT 188 08/26/2014   LYMPHOPCT 6 09/14/2018   LYMPHOPCT 24.3  08/26/2014   MONOPCT 7 09/14/2018   MONOPCT 7.7 08/26/2014   EOSPCT 0 09/14/2018   EOSPCT 4.6 08/26/2014   BASOPCT 1 09/14/2018   BASOPCT 0.9 08/26/2014    CMP:  Lab Results  Component Value Date   NA 138 09/17/2018   NA 137 08/26/2014   K 4.4 09/17/2018   K 3.8 08/26/2014   CL 103 09/17/2018   CL 101 08/26/2014   CO2 24 09/17/2018   CO2 29 08/26/2014   BUN 31 (H) 09/17/2018   BUN 7 08/26/2014   CREATININE 0.69 09/17/2018   CREATININE 0.52 (L) 08/26/2014   PROT 7.5 09/14/2018   ALBUMIN 3.5 09/14/2018   BILITOT 1.4 (H) 09/14/2018   ALKPHOS 73 09/14/2018   AST 23 09/14/2018   ALT 14 09/14/2018  .   Total Time in preparing paper work, data evaluation and todays exam - 35 minutes  Epifanio Lesches M.D on 09/21/2018 at 9:13 AM    Note: This dictation was prepared with Dragon dictation along with smaller phrase technology. Any transcriptional errors that result from this process are unintentional.

## 2018-09-21 NOTE — Clinical Social Work Placement (Signed)
   CLINICAL SOCIAL WORK PLACEMENT  NOTE  Date:  09/21/2018  Patient Details  Name: [name] MRN: [id number] Date of Birth: Feb 21, 1926  Clinical Social Work is seeking post-discharge placement for this patient at the [place] level of care (*CSW will initial, date and re-position this form in  chart as items are completed):  Yes   Patient/family provided with [place] Work Department's list of facilities offering this level of care within the geographic area requested by the patient (or if unable, by the patient's family).  Yes   Patient/family informed of their freedom to choose among providers that offer the needed level of care, that participate in Medicare, Medicaid or managed care program needed by the patient, have an available bed and are willing to accept the patient.  Yes   Patient/family informed of [place]'s ownership interest in [facility] and [facility], as well as of the fact that they are under no obligation to receive care at these facilities.  PASRR submitted to EDS on       PASRR number received on       Existing PASRR number confirmed on 09/20/18     FL2 transmitted to all facilities in geographic area requested by pt/family on 09/20/18     FL2 transmitted to all facilities within larger geographic area on       Patient informed that his/her managed care company has contracts with or will negotiate with certain facilities, including the following:        Yes   Patient/family informed of bed offers received.  Patient chooses bed at [facility] )     Physician recommends and patient chooses bed at      Patient to be transferred to   on  .  Patient to be transferred to facility by       Patient family notified on   of transfer.  Name of family member notified:        PHYSICIAN       Additional Comment:    _______________________________________________ Adaijah Endres, Veronia Beets, LCSW 09/21/2018, 3:04  PM

## 2018-09-21 NOTE — Progress Notes (Signed)
Daily Progress Note   Patient Name: Janice Hoffman       Date: 09/21/2018 DOB: June 27, 1926  Age: 85 y.o. MRN#: [id number] Attending Physician: Epifanio Lesches, MD Primary Care Physician: Baxter Hire, MD Admit Date: 09/14/2018  Reason for Consultation/Follow-up: Establishing goals of care  Subjective: Patient alert today - has told her son she wants to go to rehab, tells me her knees hurt - this is chronic pain for her - tells me tramadol helps - RN administering now. Does not like nectar thick liquids but agrees to drinking them.  Length of Stay: 6  Current Medications: Scheduled Meds:  . budesonide (PULMICORT) nebulizer solution  0.5 mg Nebulization BID  . enoxaparin (LOVENOX) injection  40 mg Subcutaneous Q24H  . ipratropium-albuterol  3 mL Nebulization TID  . mouth rinse  15 mL Mouth Rinse BID  . metoprolol tartrate  25 mg Oral BID  . traMADol  50 mg Oral Q12H    Continuous Infusions: . ampicillin-sulbactam (UNASYN) IV 1.5 g (09/21/18 0929)    PRN Meds: acetaminophen, albuterol, bisacodyl, hydrALAZINE, metoprolol tartrate, polyethylene glycol  Physical Exam       Constitutional:      General: She is not in acute distress. Cardiovascular:     Rate and Rhythm: Normal rate. Rhythm irregular.  Pulmonary:     Effort: Pulmonary effort is normal.  Neurological:     Mental Status: She is alert. Periods of confusion. Psychiatric:        Cognition and Memory: Cognition is impaired. Memory is impaired.     Vital Signs: BP (!) 157/86 (BP Location: Left Arm)   Pulse 76   Temp 98 F (36.7 C) (Oral)   Resp 18   Ht 5\' 4"  (1.626 m)   Wt 56.1 kg   SpO2 97%   BMI 21.23 kg/m  SpO2: SpO2: 97 % O2 Device: O2 Device: Room Air O2 Flow Rate: O2 Flow Rate (L/min): 2 L/min  Intake/output  summary:   Intake/Output Summary (Last 24 hours) at 09/21/2018 1025 Last data filed at 09/21/2018 0500 Gross per 24 hour  Intake 780 ml  Output 900 ml  Net -120 ml   LBM: Last BM Date: 09/20/18 Baseline Weight: Weight: 52.2 kg Most recent weight: Weight: 56.1 kg       Palliative Assessment/Data: PPS 50%    Flowsheet Rows     Most Recent Value  Intake Tab  Referral Department  Critical care  Unit at Time of Referral  Intermediate Care Unit  Palliative Care Primary Diagnosis  Pulmonary  Date Notified  09/17/18  Palliative Care Type  New Palliative care  Reason for referral  Clarify Goals of Care  Date of Admission  09/15/18  Date first seen by Palliative Care  09/17/18  # of days Palliative referral response time  0 Day(s)  # of days IP prior to Palliative referral  2  Clinical Assessment  Palliative Performance Scale Score  10%  Psychosocial & Spiritual Assessment  Palliative Care Outcomes  Patient/Family meeting held?  Yes  Who was at the meeting?  son  Palliative Care Outcomes  Clarified goals of care, Counseled regarding hospice, Provided end of life care assistance, Provided psychosocial  or spiritual support      Patient Active Problem List   Diagnosis Date Noted  . Acute respiratory distress   . Dementia without behavioral disturbance ([place])   . Pneumonia of both lower lobes due to infectious organism ([place])   . Palliative care by specialist   . Goals of care, counseling/discussion   . Protein-calorie malnutrition, severe 09/16/2018  . Sepsis due to pneumonia ([place]) 09/15/2018  . Sepsis ([place]) 07/18/2018  . Acute exacerbation of chronic obstructive pulmonary disease (COPD) ([place]) 03/14/2018  . Acute on chronic respiratory failure with hypoxemia ([place]) 02/22/2018  . CAP (community acquired pneumonia) 08/18/2017  . New onset a-fib ([place]) 06/15/2017  . Near syncope 05/31/2017  . COPD with acute exacerbation ([place]) 01/05/2017  . Essential hypertension, malignant  01/05/2017  . Dysphagia 01/05/2017  . COPD exacerbation ([place]) 03/15/2016  . Osteoarthritis 10/12/2014  . Osteoporosis, post-menopausal 10/12/2014  . Pernicious anemia 06/07/2014  . Vitamin D deficiency 06/07/2014  . Post-traumatic osteoarthritis of left knee 05/24/2014  . Pain in joint, lower leg 08/03/2013  . 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 ELEVATED BLOOD PRESSURE 05/12/2008  . HERPES ZOSTER 04/26/2008  . B12 DEFICIENCY 04/26/2008  . CONSTIPATION 04/26/2008  . LEG EDEMA, BILATERAL 04/26/2008    Palliative Care Assessment & Plan   HPI: 85 y.o. female  with past medical history of mild dementia, COPD, HTN, and neuralgia admitted on 09/14/2018 with AMS and respiratory distress. Patient was intubated in ED.  She was diagnosed with sepsis and acute on chronic hypoxic respiratory failure secondary to bilateral pna and aspiration. Food particles were suctioned from ET tube after intubation. According to family, patient had not eaten in 3 days. Patient also diagnosed with esophageal dysmotility. Per DR. Patsey Berthold, patient's CT scans as far back as 2017 reveal evidence of esophageal dilation. Patient was extubated 1/8. Patient unable to tolerate feeding tube insertion. Patient has also not been appropriate for SLP evaluation d/t AMS. PMT consulted for [place].  Assessment: Patient's son called PMT multiple times this AM - he wanted to discuss his mother's improvement over the weekend. She is much more alert now and tolerating a diet - does not enjoy thickened liquids but agreeable to drinking them. He tells me patient has been sitting on the side of the bed feeding herself over the weekend. Getting up to use the bedside commode and getting into a wheelchair.   He is hopeful patient can return to her baseline and hopeful that rehab can assist with this. His plan is to get his mother home with a 24/7 sitter after rehab is complete. We discussed patient receiving outpatient palliative at rehab - I shared  the type of support this provides and continued goals of care discussion.   Questions and concerns were addressed. The family was encouraged to call with questions or concerns.   Recommendations/Plan:  Continued discussion about goals of care - family has been educated about poor long-term prognosis d/t frailty, poor PO intake, risks for aspiration, and overall failure to thrive   Family would like to proceed with rehab placement and outpatient palliative to follow  Goals of Care and Additional Recommendations:  Limitations on Scope of Treatment: Full Scope Treatment  Code Status:  DNR  Prognosis:   Unable to determine  Discharge Planning:  [place] for rehab with Palliative care service follow-up  Care plan was discussed with patient's son and daughter-in-law  Thank you for allowing the Palliative Medicine Team to assist in the care of this patient.  Total Time 25 minutes Prolonged Time Billed  no      Greater than 50%  of this time was spent counseling and coordinating care related to the above assessment and plan.  Juel Burrow, DNP, [facility] Palliative Medicine Team Team Phone # [phone]  Pager [phone]

## 2018-09-22 MED ORDER — TRAMADOL HCL 50 MG PO TABS: 50.0000 mg | ORAL_TABLET | Freq: Two times a day (BID) | ORAL | 0 refills | Status: DC

## 2018-09-22 NOTE — Clinical Social Work Placement (Signed)
   CLINICAL SOCIAL WORK PLACEMENT  NOTE  Date:  09/22/2018  Patient Details  Name: Janice Hoffman MRN: [id number] Date of Birth: May 29, 1926  Clinical Social Work is seeking post-discharge placement for this patient at the [place] level of care (*CSW will initial, date and re-position this form in  chart as items are completed):  Yes   Patient/family provided with [place] Work Department's list of facilities offering this level of care within the geographic area requested by the patient (or if unable, by the patient's family).  Yes   Patient/family informed of their freedom to choose among providers that offer the needed level of care, that participate in Medicare, Medicaid or managed care program needed by the patient, have an available bed and are willing to accept the patient.  Yes   Patient/family informed of [facility]'s ownership interest in [facility] and [facility], as well as of the fact that they are under no obligation to receive care at these facilities.  PASRR submitted to EDS on       PASRR number received on       Existing PASRR number confirmed on 09/20/18     FL2 transmitted to all facilities in geographic area requested by pt/family on 09/20/18     FL2 transmitted to all facilities within larger geographic area on       Patient informed that his/her managed care company has contracts with or will negotiate with certain facilities, including the following:        Yes   Patient/family informed of bed offers received.  Patient chooses bed at [facility] )     Physician recommends and patient chooses bed at      Patient to be transferred to [facility] ) on 09/22/18.  Patient to be transferred to facility by [facility] )     Patient family notified on 09/22/18 of transfer.  Name of family member notified:  (Patient's son Janice Hoffman and daughter in law Janice Hoffman are aware of D/C today. )     PHYSICIAN        Additional Comment:    _______________________________________________ Faithlyn Recktenwald, Veronia Beets, LCSW 09/22/2018, 3:27 PM

## 2018-09-22 NOTE — Discharge Planning (Signed)
Patient IV removed.  RN assessment and VS revealed stability for DC to [facility].  Report called and s/w Rubie Maid, LPN. Discharge papers printed and placed in facility packets with signed pain script and [place] DNR.  EMS contacted to transport to room 329.  Waiting on arrival.   Daughter to be contacted when EMS arrives.

## 2018-09-22 NOTE — Plan of Care (Signed)
Discussed with patient plan of care for the evening, pain management and eating a snack at bedtime with some teach back displayed.

## 2018-09-22 NOTE — Discharge Summary (Addendum)
Janice Hoffman, is a 85 y.o. female  DOB [birth year]  MRN [id number].  Admission date:  09/14/2018  Admitting Physician  Arta Silence, MD  Discharge Date:  09/22/2018   Primary MD  Baxter Hire, MD  Recommendations for primary care physician for things to follow:   Discharge to rehab   Admission Diagnosis  Pneumonia of both lower lobes due to infectious organism [facility]) [J18.1] Sepsis, due to unspecified organism, unspecified whether acute organ dysfunction present [facility]) [A41.9]   Discharge Diagnosis  Pneumonia of both lower lobes due to infectious organism ([place]) [J18.1] Sepsis, due to unspecified organism, unspecified whether acute organ dysfunction present [facility]) [A41.9]    Active Problems:   Sepsis due to pneumonia ([place])   Protein-calorie malnutrition, severe   Pneumonia of both lower lobes due to infectious organism [facility])   Palliative care by specialist   Goals of care, counseling/discussion   Acute respiratory distress   Dementia without behavioral disturbance ([place])      Past Medical History:  Diagnosis Date  . Asthma   . COPD (chronic obstructive pulmonary disease) ([place])   . Herpes zoster   . Hypertension   . Neuralgia     Past Surgical History:  Procedure Laterality Date  . LEG SURGERY         History of present illness and  Hospital Course:     Kindly see H&P for history of present illness and admission details, please review complete Labs, Consult reports and Test reports for all details in brief  HPI  from the history and physical done on the day of admission 85 year old female patient admitted for sepsis, pneumonia, initially admitted to intensive care unit, requiring vent support  Hospital Course  #1 ./sepsis with evidence of elevated lactic acid, present on admission, secondary to  pneumonia, and received cefepime, vancomycin, doxycycline on admission.  Patient had aspiration event, patient had suctioning in the emergency room by respiratory and suctioned a lot of food particles, admitted to ICU, intubated.  Patient got extubated, transferred to regular floor.  Patient is not hypoxic anymore, O2 saturation 97% on room air.  Afebrile.  Hemodynamically stable.  Received Unasyn for aspiration event, patient is on antibiotics since admission on January 6. Patient had leukocytosis with white count up to 15.7 decreased to 10.6. No further need of antibiotics.  Continue strict aspiration precautions, seen by speech therapy, recommended dysphagia 1 diet with nectar thick liquids, no straws, meds crushed in pure, patient is at high risk for aspiration due to advanced age.  #2 .deconditioning, physical therapy recommended SNF placement, patient came from home, initially she was very weak but now she is looking much better, hopefully she can go home, son wants to take her home after she gets stronger at rehab.  Discussed with patient's son, who is agreeable for rehab, patient is stable for discharge to rehab today. #3. paroxysmal atrial fibrillation: Improved with Cardizem drip, Lasix, #4 .  Alzheimer's dementia;Palliative care team to follow at SNF. #5. severe malnutrition in the context of chronic illness, seen by nutritionis 6. left  knee pain, patient has chronic left  knee pain, continue tramadol, Tylenol PM.  #7 .COPD: Discharge Condition:  #8 metabolic encephalopathy patient had altered mental status, fever, pneumonia which improved, she is alert, awake, oriented.  Follow UP  Contact information for after-discharge care    Destination    [facility] .   Service:  Skilled Nursing Contact information: [place]  [address] [place] [phone]                Discharge Instructions  and  Discharge Medications      Allergies  as of 09/22/2018      Reactions   Aspirin Shortness Of Breath   Nsaids Shortness Of Breath, Anaphylaxis   Throat swelling, shortness of breath   Sulfa Antibiotics Other (See Comments)   Stomach pain Stomach pain   Codeine Other (See Comments)   Short of breath   Ciprofloxacin Hives   Penicillins    Has patient had a PCN reaction causing immediate rash, facial/tongue/throat swelling, SOB or lightheadedness with hypotension: Unknown Has patient had a PCN reaction causing severe rash involving mucus membranes or skin necrosis: Unknown Has patient had a PCN reaction that required hospitalization: Unknown Has patient had a PCN reaction occurring within the last 10 years: Unknown If all of the above answers are "NO", then may proceed with Cephalosporin use.   Phenol    Other reaction(s): Angioedema (ALLERGY/intolerance), Other (See Comments) Spasms, nerve symptoms Spasms, nerve symptoms   Promethazine Hcl       Medication List    TAKE these medications   acetaminophen 650 MG CR tablet Commonly known as:  TYLENOL Take 650 mg by mouth every 8 (eight) hours as needed for pain. What changed:  Another medication with the same name was removed. Continue taking this medication, and follow the directions you see here.   albuterol (2.5 MG/3ML) 0.083% nebulizer solution Commonly known as:  PROVENTIL Take 3 mLs (2.5 mg total) by nebulization every 3 (three) hours as needed for wheezing or shortness of breath.   cyanocobalamin 1000 MCG/ML injection Commonly known as:  (VITAMIN B-12) Inject 1,000 mcg into the skin every 30 (thirty) days.   feeding supplement (ENSURE ENLIVE) Liqd Take 237 mLs by mouth 2 (two) times daily between meals.   Fluticasone-Salmeterol 500-50 MCG/DOSE Aepb Commonly known as:  ADVAIR Inhale 1 puff into the lungs 2 (two) times daily.   lisinopril 5 MG tablet Commonly known as:  PRINIVIL,ZESTRIL Take 1 tablet (5 mg total) by mouth daily.   metoprolol tartrate 25 MG  tablet Commonly known as:  LOPRESSOR Take 1 tablet (25 mg total) by mouth 2 (two) times daily.   polyethylene glycol packet Commonly known as:  MIRALAX / GLYCOLAX Take 17 g by mouth daily as needed.   tiotropium 18 MCG inhalation capsule Commonly known as:  SPIRIVA HANDIHALER Place 1 capsule (18 mcg total) into inhaler and inhale daily.   traMADol 50 MG tablet Commonly known as:  ULTRAM Take 50 mg by mouth 2 (two) times daily.         Diet and Activity recommendation: See Discharge Instructions above   Consults obtained -intensivist, physical therapy   Major procedures and Radiology Reports - PLEASE review detailed and final reports for all details, in brief -     Dg Chest 1 View  Result Date: 09/17/2018 CLINICAL DATA:  Acute respiratory distress. EXAM: CHEST  1 VIEW COMPARISON:  Radiographs 09/15/2018 FINDINGS: Endotracheal and enteric tubes have been removed. Patchy bibasilar opacities are similar to prior exam. Slight improvement in left pleural effusion. Unchanged heart size and mediastinal contours. No pulmonary edema or pneumothorax. Scoliotic curvature of spine. IMPRESSION: Patchy bibasilar opacities are similar to prior exam. Slight improvement in left pleural effusion. Electronically Signed   By: Keith Rake M.D.   On: 09/17/2018 02:11   Dg Chest 2 View  Result Date: 09/14/2018 CLINICAL  DATA:  Fever, possible sepsis EXAM: CHEST - 2 VIEW COMPARISON:  07/18/2018 FINDINGS: Bibasilar infiltrates and small pleural effusions. Stable cardiomediastinal silhouette with aortic atherosclerosis. No pneumothorax. Scoliosis and degenerative changes of the spine. IMPRESSION: Small pleural effusions with bibasilar infiltrates. Electronically Signed   By: Donavan Foil M.D.   On: 09/14/2018 23:26   Dg Abd 1 View  Result Date: 09/15/2018 CLINICAL DATA:  OG tube placement. EXAM: ABDOMEN - 1 VIEW COMPARISON:  One-view abdomen 12/15/2017 FINDINGS: Side port of an OG tube is in the  stomach. The stomach is decompressed. Gas is present in the proximal colon. There is moderate stool throughout the distal colon. There is no obstruction or free air. Levoconvex scoliosis of the lumbar spine is again seen. Atherosclerotic calcifications are present. IMPRESSION: 1. Side port of the OG tube is in the fundus of the stomach. 2. Mild gaseous distention of colon proximally with moderate stool distally. Electronically Signed   By: San Morelle M.D.   On: 09/15/2018 10:33   Dg Chest Port 1 View  Result Date: 09/15/2018 CLINICAL DATA:  Intubation and OG tube placement. Radiographs yesterday. EXAM: PORTABLE CHEST 1 VIEW COMPARISON:  Frontal and lateral views yesterday. FINDINGS: Endotracheal tube tip 4.7 cm from the carina. Enteric tube is in place, tip in the distal esophagus, advancement of least 10 cm recommend. Bibasilar opacities and left pleural effusion are unchanged from prior exam allowing for differences in technique. Unchanged heart size and mediastinal contours with aortic atherosclerosis. There is mild interstitial coarsening. No pneumothorax. Scoliotic curvature of spine. IMPRESSION: 1. Endotracheal tube 4.7 cm from the carina. Enteric tube in place, tip in the distal esophagus, advancement of least 10 cm recommended. 2. Unchanged bibasilar opacities and small left pleural effusion. Unchanged interstitial coarsening. Electronically Signed   By: Keith Rake M.D.   On: 09/15/2018 01:19   Dg Abd Portable 1v  Result Date: 09/16/2018 CLINICAL DATA:  Nasogastric tube placement. EXAM: PORTABLE ABDOMEN - 1 VIEW COMPARISON:  Radiograph of September 15, 2018. FINDINGS: The bowel gas pattern is normal. Distal tip of feeding tube is seen in expected position of distal stomach. No radio-opaque calculi or other significant radiographic abnormality are seen. IMPRESSION: Distal tip of feeding tube seen in expected position of distal stomach. No definite evidence of bowel obstruction or ileus.  Electronically Signed   By: Marijo Conception, M.D.   On: 09/16/2018 16:19   Dg Abd Portable 1v  Result Date: 09/15/2018 CLINICAL DATA:  Enteric tube adjustment. EXAM: PORTABLE ABDOMEN - 1 VIEW COMPARISON:  Abdominal x-ray from same day at 1322. FINDINGS: There has been slight advancement of the enteric tube, with the distal side port now likely just beyond the gastroesophageal junction. Tip remains in the stomach. The bowel gas pattern is unchanged. Stable bibasilar opacities and small left pleural effusion. Endotracheal tube appropriately positioned with the tip 4.7 cm above the carina. IMPRESSION: 1. Slight advancement of the enteric tube with the distal side port now likely just beyond the gastroesophageal junction. Electronically Signed   By: Titus Dubin M.D.   On: 09/15/2018 14:19   Dg Abd Portable 1v  Result Date: 09/15/2018 CLINICAL DATA:  NG tube placement EXAM: PORTABLE ABDOMEN - 1 VIEW COMPARISON:  KUB from earlier today FINDINGS: The NG tube has not changed significantly in position with the tip in the proximal body of the stomach. The opening along the distal portion of the tube lies near the gastroesophageal junction and advancement would be helpful. The bowel  gas pattern is nonspecific. Moderate thoracolumbar scoliosis is again noted with diffuse degenerative change. IMPRESSION: 1. Tip of NG tube in the region of the proximal body of the stomach. 2. Opening of the distal portion of the NG tube is near the GE junction. Recommend advancing the tube. Electronically Signed   By: Ivar Drape M.D.   On: 09/15/2018 13:34   Dg Abd Portable 1v  Result Date: 09/15/2018 CLINICAL DATA:  OG tube placement. EXAM: PORTABLE ABDOMEN - 1 VIEW COMPARISON:  Prior exam same day. FINDINGS: OG tube tip and side hole are again noted in the stomach. However the proximal portion of the OG 2 is again noted coiled in the esophagus and repositioning should be considered. IMPRESSION: OG tip and side hole are again noted  in the stomach. However the proximal portion of the OG to is again noted coiled in the esophagus. Repositioning should be considered. These results will be called to the ordering clinician or representative by the Radiologist Assistant, and communication documented in the PACS or zVision Dashboard. Electronically Signed   By: Marcello Moores  Register   On: 09/15/2018 12:46   Dg Abd Portable 1v  Result Date: 09/15/2018 CLINICAL DATA:  85 year old female. Orogastric tube placement. Subsequent encounter. EXAM: PORTABLE ABDOMEN - 1 VIEW COMPARISON:  09/15/2026 12:03 p.m. FINDINGS: Orogastric tube is curled within the distal esophagus. Side hole just beyond expected level of the gastroesophageal junction with tip gastric fundus level. Appearance without significant change since recent exam. Gas-filled slightly prominent size colon. The possibility of free intraperitoneal air cannot be assessed on a supine view. IMPRESSION: 1. Orogastric tube is curled within the distal esophagus. Side hole just beyond expected level of the gastroesophageal junction with tip gastric fundus level. Appearance without significant change since recent exam. 2. Gas-filled slightly prominent size colon. These results will be called to the ordering clinician or representative by the Radiologist Assistant, and communication documented in the PACS or zVision Dashboard. Electronically Signed   By: Genia Del M.D.   On: 09/15/2018 12:29   Dg Abd Portable 1v  Result Date: 09/15/2018 CLINICAL DATA:  OG tube placement. EXAM: PORTABLE ABDOMEN - 1 VIEW COMPARISON:  09/15/2018. FINDINGS: OG tube noted with coiled loops in the distal esophagus. The tip and side hole of the OG tube are in the stomach. Repositioning with straightening of the OG tube should be considered. Bibasilar atelectasis/infiltrates and small pleural effusions. IMPRESSION: OG tube noted with coiled loops in the distal esophagus. The tip and side hole of the NG tube are in the stomach.  Repositioning with straightening of the OG tube should be considered. These results will be called to the ordering clinician or representative by the Radiologist Assistant, and communication documented in the PACS or zVision Dashboard. Electronically Signed   By: Marcello Moores  Register   On: 09/15/2018 12:23    Micro Results     Recent Results (from the past 240 hour(s))  Urine culture     Status: Abnormal   Collection Time: 09/14/18 11:29 PM  Result Value Ref Range Status   Specimen Description   Final    URINE, RANDOM Performed at [facility], [address], [place], [place] [address]    Special Requests   Final    NONE Performed at [facility], [place], [place] [address]    Culture 70,000 COLONIES/mL ENTEROCOCCUS FAECALIS (A)  Final   Report Status 09/17/2018 FINAL  Final   Organism ID, Bacteria ENTEROCOCCUS FAECALIS (A)  Final      Susceptibility   Enterococcus faecalis - MIC*    AMPICILLIN <=2 SENSITIVE Sensitive     LEVOFLOXACIN >=8 RESISTANT Resistant     NITROFURANTOIN <=16 SENSITIVE Sensitive     VANCOMYCIN 1 SENSITIVE Sensitive     * 70,000 COLONIES/mL ENTEROCOCCUS FAECALIS  Culture, blood (Routine x 2)     Status: None   Collection Time: 09/14/18 11:30 PM  Result Value Ref Range Status   Specimen Description BLOOD LEFT ANTECUBITAL  Final   Special Requests   Final    BOTTLES DRAWN AEROBIC AND ANAEROBIC Blood Culture adequate volume   Culture   Final    NO GROWTH 5 DAYS Performed at [facility], [address] [place], [place] [address]    Report Status 09/19/2018 FINAL  Final  Culture, blood (Routine x 2)     Status: None   Collection Time: 09/14/18 11:30 PM  Result Value Ref Range Status   Specimen Description BLOOD BLOOD RIGHT FOREARM  Final   Special Requests   Final    BOTTLES DRAWN AEROBIC AND ANAEROBIC Blood Culture adequate volume   Culture   Final    NO GROWTH 5 DAYS Performed at [facility],  [address] [place], [place] [address]    Report Status 09/19/2018 FINAL  Final  MRSA PCR Screening     Status: None   Collection Time: 09/15/18  7:51 AM  Result Value Ref Range Status   MRSA by PCR NEGATIVE NEGATIVE Final    Comment:        The GeneXpert MRSA Assay (FDA approved for NASAL specimens only), is one component of a comprehensive MRSA colonization surveillance program. It is not intended to diagnose MRSA infection nor to guide or monitor treatment for MRSA infections. Performed at [facility], [address] [place], [place] [address]   Culture, respiratory (non-expectorated)     Status: None   Collection Time: 09/15/18 12:30 PM  Result Value Ref Range Status   Specimen Description   Final    TRACHEAL ASPIRATE Performed at [facility], [address], [place], [place] [address]    Special Requests   Final    NONE Performed at [facility] [place], [place] [address]    Gram Stain NO WBC SEEN NO ORGANISMS SEEN   Final   Culture   Final    NO GROWTH 2 DAYS Performed at [facility], [place] [address] [place], [place] [address]    Report Status 09/17/2018 FINAL  Final       Today   Subjective:   Maree Ainley today feels much stronger, stable for discharge.  Objective:   Blood pressure (!) 158/74, pulse 65, temperature 97.8 F (36.6 C), temperature source Oral, resp. rate 18, height 5\' 4"  (1.626 m), weight 55.2 kg, SpO2 100 %.   Intake/Output Summary (Last 24 hours) at 09/22/2018 1120 Last data filed at 09/22/2018 0900 Gross per 24 hour  Intake 900 ml  Output -  Net 900 ml    Exam Awake Alert, Oriented x 3, No new F.N deficits, Normal affect [place].AT,PERRAL Supple Neck,No JVD, No cervical lymphadenopathy appriciated.  Symmetrical Chest wall movement, Good air movement bilaterally, CTAB RRR,No Gallops,Rubs or new Murmurs, No Parasternal Heave +ve B.Sounds, Abd Soft, Non tender, No  organomegaly appriciated, No rebound -guarding or rigidity. No Cyanosis, Clubbing or edema, No new Rash or bruise  Data Review   CBC w Diff:  Lab Results  Component Value Date   WBC 10.6 (H) 09/19/2018   HGB 11.5 (L) 09/19/2018   HGB 14.3 08/26/2014   HCT 37.1 09/19/2018   HCT 42.2 08/26/2014   PLT 334 09/19/2018   PLT 188 08/26/2014   LYMPHOPCT 6 09/14/2018   LYMPHOPCT 24.3 08/26/2014   MONOPCT 7 09/14/2018   MONOPCT 7.7 08/26/2014   EOSPCT 0 09/14/2018   EOSPCT 4.6 08/26/2014   BASOPCT 1 09/14/2018   BASOPCT 0.9 08/26/2014    CMP:  Lab Results  Component Value Date   NA 138 09/17/2018   NA 137 08/26/2014   K 4.4 09/17/2018   K 3.8 08/26/2014   CL 103 09/17/2018   CL 101 08/26/2014   CO2 24 09/17/2018   CO2 29 08/26/2014   BUN 31 (H) 09/17/2018   BUN 7 08/26/2014   CREATININE 0.69 09/17/2018   CREATININE 0.52 (L) 08/26/2014   PROT 7.5 09/14/2018   ALBUMIN 3.5 09/14/2018   BILITOT 1.4 (H) 09/14/2018   ALKPHOS 73 09/14/2018   AST 23 09/14/2018   ALT 14 09/14/2018  .   Total Time in preparing paper work, data evaluation and todays exam - 35 minutes  Epifanio Lesches M.D on 09/22/2018 at 11:20 AM    Note: This dictation was prepared with Dragon dictation along with smaller phrase technology. Any transcriptional errors that result from this process are unintentional.

## 2018-09-22 NOTE — Progress Notes (Signed)
Patient is medically stable for D/C to [facility] today with outpatient palliative. Per Seth Bake admissions coordinator at [facility] authorization is still pending however she will accept her today to room 329. RN will call report at [phone] and arrange EMS for transport. Clinical Education officer, museum (CSW) sent D/C orders to [facility] via [facility]. Patient's son [name] is aware of above and understands that if [facility] denies SNF then it will be private pay at [facility] or patient will have to discharge home from [facility] with home health. Patient's daughter Izora Gala is also aware of D/C today. Londell Moh hospice liaison is aware of above. Please reconsult if future social work needs arise. CSW signing off.   [employer], LCSW [phone]

## 2018-09-24 DIAGNOSIS — I10 Essential (primary) hypertension: Secondary | ICD-10-CM

## 2018-09-24 DIAGNOSIS — J690 Pneumonitis due to inhalation of food and vomit: Secondary | ICD-10-CM | POA: Diagnosis not present

## 2018-09-24 DIAGNOSIS — R1312 Dysphagia, oropharyngeal phase: Secondary | ICD-10-CM

## 2018-09-24 DIAGNOSIS — J439 Emphysema, unspecified: Secondary | ICD-10-CM

## 2018-09-24 DIAGNOSIS — G301 Alzheimer's disease with late onset: Secondary | ICD-10-CM

## 2018-09-24 DIAGNOSIS — E441 Mild protein-calorie malnutrition: Secondary | ICD-10-CM | POA: Diagnosis not present

## 2018-09-24 DIAGNOSIS — I480 Paroxysmal atrial fibrillation: Secondary | ICD-10-CM

## 2019-03-30 DIAGNOSIS — J986 Disorders of diaphragm: Secondary | ICD-10-CM | POA: Diagnosis not present

## 2019-03-30 DIAGNOSIS — M4184 Other forms of scoliosis, thoracic region: Secondary | ICD-10-CM | POA: Diagnosis not present

## 2019-03-30 DIAGNOSIS — D510 Vitamin B12 deficiency anemia due to intrinsic factor deficiency: Secondary | ICD-10-CM | POA: Diagnosis not present

## 2019-03-30 DIAGNOSIS — R8290 Unspecified abnormal findings in urine: Secondary | ICD-10-CM | POA: Diagnosis not present

## 2019-03-30 DIAGNOSIS — J431 Panlobular emphysema: Secondary | ICD-10-CM | POA: Diagnosis not present

## 2019-03-30 DIAGNOSIS — M47814 Spondylosis without myelopathy or radiculopathy, thoracic region: Secondary | ICD-10-CM | POA: Diagnosis not present

## 2019-03-30 DIAGNOSIS — R509 Fever, unspecified: Secondary | ICD-10-CM | POA: Diagnosis not present

## 2019-05-25 ENCOUNTER — Emergency Department: Payer: PPO

## 2019-05-25 ENCOUNTER — Emergency Department
Admission: EM | Admit: 2019-05-25 | Discharge: 2019-05-25 | Disposition: A | Discharge status: Home / Self Care | Payer: PPO | Attending: Emergency Medicine | Admitting: Emergency Medicine

## 2019-05-25 ENCOUNTER — Encounter: Payer: Self-pay | Admitting: Emergency Medicine

## 2019-05-25 DIAGNOSIS — Y939 Activity, unspecified: Secondary | ICD-10-CM | POA: Diagnosis not present

## 2019-05-25 DIAGNOSIS — Y998 Other external cause status: Secondary | ICD-10-CM | POA: Insufficient documentation

## 2019-05-25 DIAGNOSIS — S0101XA Laceration without foreign body of scalp, initial encounter: Secondary | ICD-10-CM | POA: Diagnosis not present

## 2019-05-25 DIAGNOSIS — F0390 Unspecified dementia without behavioral disturbance: Secondary | ICD-10-CM | POA: Diagnosis not present

## 2019-05-25 DIAGNOSIS — S0990XA Unspecified injury of head, initial encounter: Secondary | ICD-10-CM | POA: Diagnosis not present

## 2019-05-25 DIAGNOSIS — Y92019 Unspecified place in single-family (private) house as the place of occurrence of the external cause: Secondary | ICD-10-CM | POA: Insufficient documentation

## 2019-05-25 DIAGNOSIS — W19XXXA Unspecified fall, initial encounter: Secondary | ICD-10-CM | POA: Insufficient documentation

## 2019-05-25 DIAGNOSIS — R404 Transient alteration of awareness: Secondary | ICD-10-CM | POA: Diagnosis not present

## 2019-05-25 DIAGNOSIS — J449 Chronic obstructive pulmonary disease, unspecified: Secondary | ICD-10-CM | POA: Diagnosis not present

## 2019-05-25 DIAGNOSIS — R52 Pain, unspecified: Secondary | ICD-10-CM | POA: Diagnosis not present

## 2019-05-25 DIAGNOSIS — I10 Essential (primary) hypertension: Secondary | ICD-10-CM | POA: Insufficient documentation

## 2019-05-25 DIAGNOSIS — R58 Hemorrhage, not elsewhere classified: Secondary | ICD-10-CM | POA: Diagnosis not present

## 2019-05-25 DIAGNOSIS — R55 Syncope and collapse: Secondary | ICD-10-CM | POA: Diagnosis not present

## 2019-05-25 LAB — COMPREHENSIVE METABOLIC PANEL
ALT: 11 U/L (ref 0–44)
AST: 18 U/L (ref 15–41)
Albumin: 4.1 g/dL (ref 3.5–5.0)
Alkaline Phosphatase: 36 U/L — ABNORMAL LOW (ref 38–126)
Anion gap: 11 (ref 5–15)
BUN: 15 mg/dL (ref 8–23)
CO2: 25 mmol/L (ref 22–32)
Calcium: 9.2 mg/dL (ref 8.9–10.3)
Chloride: 100 mmol/L (ref 98–111)
Creatinine, Ser: 0.71 mg/dL (ref 0.44–1.00)
GFR calc Af Amer: >60 mL/min (ref >60)
GFR calc non Af Amer: >60 mL/min (ref >60)
Glucose, Bld: 121 mg/dL — ABNORMAL HIGH (ref 70–99)
Potassium: 4.1 mmol/L (ref 3.5–5.1)
Sodium: 136 mmol/L (ref 135–145)
Total Bilirubin: 0.8 mg/dL (ref 0.3–1.2)
Total Protein: 7.6 g/dL (ref 6.5–8.1)

## 2019-05-25 LAB — CBC WITH DIFFERENTIAL/PLATELET
Abs Immature Granulocytes: 0.03 10*3/uL (ref 0.00–0.07)
Basophils Absolute: 0 10*3/uL (ref 0.0–0.1)
Basophils Relative: 0 %
Eosinophils Absolute: 0 10*3/uL (ref 0.0–0.5)
Eosinophils Relative: 0 %
HCT: 37.6 % (ref 36.0–46.0)
Hemoglobin: 12.1 g/dL (ref 12.0–15.0)
Immature Granulocytes: 0 %
Lymphocytes Relative: 9 %
Lymphs Abs: 0.9 10*3/uL (ref 0.7–4.0)
MCH: 27.7 pg (ref 26.0–34.0)
MCHC: 32.2 g/dL (ref 30.0–36.0)
MCV: 86 fL (ref 80.0–100.0)
Monocytes Absolute: 0.5 10*3/uL (ref 0.1–1.0)
Monocytes Relative: 6 %
Neutro Abs: 7.8 10*3/uL — ABNORMAL HIGH (ref 1.7–7.7)
Neutrophils Relative %: 85 %
Platelets: 204 10*3/uL (ref 150–400)
RBC: 4.37 MIL/uL (ref 3.87–5.11)
RDW: 15 % (ref 11.5–15.5)
WBC: 9.3 10*3/uL (ref 4.0–10.5)
nRBC: 0 % (ref 0.0–0.2)

## 2019-05-25 LAB — URINALYSIS, COMPLETE (UACMP) WITH MICROSCOPIC
Bacteria, UA: NONE SEEN
Bilirubin Urine: NEGATIVE
Glucose, UA: NEGATIVE mg/dL
Hgb urine dipstick: NEGATIVE
Ketones, ur: 5 mg/dL — AB
Leukocytes,Ua: NEGATIVE
Nitrite: NEGATIVE
Protein, ur: 30 mg/dL — AB
Specific Gravity, Urine: 1.013 (ref 1.005–1.030)
pH: 5 (ref 5.0–8.0)

## 2019-05-25 NOTE — ED Notes (Signed)
Pt was cleaned up and new brief placed.

## 2019-05-25 NOTE — ED Provider Notes (Signed)
[facility] Emergency Department Provider Note       Time seen: ----------------------------------------- 7:08 AM on 05/25/2019 ----------------------------------------- Level V caveat: History/ROS limited by dementia  I have reviewed the triage vital signs and the nursing notes.  HISTORY   Chief Complaint Fall   HPI LACRESIA FERNANDO is a 85 y.o. female with a history of dementia, asthma, COPD, zoster, hypertension, neuralgia who presents to the ED for a fall.  This morning family found her on the floor with blood coming from a scalp wound.  She was left alone at home throughout the night.  No further information was available.  Past Medical History:  Diagnosis Date  . Asthma   . COPD (chronic obstructive pulmonary disease) ([place])   . Herpes zoster   . Hypertension   . Neuralgia     Patient Active Problem List   Diagnosis Date Noted  . Acute respiratory distress   . Dementia without behavioral disturbance ([place])   . Pneumonia of both lower lobes due to infectious organism ([place])   . Palliative care by specialist   . Goals of care, counseling/discussion   . Protein-calorie malnutrition, severe 09/16/2018  . Sepsis due to pneumonia ([place]) 09/15/2018  . Sepsis ([place]) 07/18/2018  . Acute exacerbation of chronic obstructive pulmonary disease (COPD) ([place]) 03/14/2018  . Acute on chronic respiratory failure with hypoxemia ([place]) 02/22/2018  . CAP (community acquired pneumonia) 08/18/2017  . New onset a-fib ([place]) 06/15/2017  . Near syncope 05/31/2017  . COPD with acute exacerbation ([place]) 01/05/2017  . Essential hypertension, malignant 01/05/2017  . Dysphagia 01/05/2017  . COPD exacerbation ([place]) 03/15/2016  . Osteoarthritis 10/12/2014  . Osteoporosis, post-menopausal 10/12/2014  . Pernicious anemia 06/07/2014  . Vitamin D deficiency 06/07/2014  . Post-traumatic osteoarthritis of left knee 05/24/2014  . Pain in joint, lower leg 08/03/2013  . POSTHERPETIC  NEURALGIA 05/12/2008  . ELEVATED BLOOD PRESSURE 05/12/2008  . HERPES ZOSTER 04/26/2008  . B12 DEFICIENCY 04/26/2008  . CONSTIPATION 04/26/2008  . LEG EDEMA, BILATERAL 04/26/2008    Past Surgical History:  Procedure Laterality Date  . LEG SURGERY      Allergies Aspirin, Nsaids, Sulfa antibiotics, Codeine, Ciprofloxacin, Penicillins, Phenol, and Promethazine hcl  Social History Social History   Tobacco Use  . Smoking status: Never Smoker  . Smokeless tobacco: Never Used  Substance Use Topics  . Alcohol use: No    Alcohol/week: 0.0 standard drinks  . Drug use: No   Review of Systems Positive for fall, scalp laceration, otherwise unknown All systems negative/normal/unremarkable except as stated in the HPI  ____________________________________________   PHYSICAL EXAM:  VITAL SIGNS: ED Triage Vitals [05/25/19 0702]  Enc Vitals Group     BP (!) 165/94     Pulse Rate 89     Resp 18     Temp 98.3 F (36.8 C)     Temp Source Oral     SpO2 98 %     Weight      Height      Head Circumference      Peak Flow      Pain Score      Pain Loc      Pain Edu?      Excl. in [place]?    Constitutional: Alert but disoriented, patient is covered in blood, disheveled appearing Eyes: Conjunctivae are normal. ENT      Head: Normocephalic, large left frontal scalp laceration approaching the hairline      Nose: No congestion/rhinnorhea.  Mouth/Throat: Mucous membranes are moist.      Neck: No stridor. Cardiovascular: Normal rate, regular rhythm. No murmurs, rubs, or gallops. Respiratory: Normal respiratory effort without tachypnea nor retractions. Breath sounds are clear and equal bilaterally. No wheezes/rales/rhonchi. Gastrointestinal: Soft and nontender. Normal bowel sounds Musculoskeletal: Nontender with normal range of motion in extremities. No lower extremity tenderness nor edema. Neurologic:  Normal speech and language. No gross focal neurologic deficits are appreciated.   Skin: 8 cm scalp laceration just below the hairline on the left frontal scalp Psychiatric: Mood and affect are normal. Speech and behavior are normal.  ____________________________________________  ED COURSE:  As part of my medical decision making, I reviewed the following data within the [place] History obtained from family if available, nursing notes, old chart and ekg, as well as notes from prior ED visits. Patient presented for a fall with scalp laceration, we will assess with labs and imaging as indicated at this time.   Marland Kitchen.Laceration Repair  Date/Time: 05/25/2019 7:51 AM Performed by: Earleen Newport, MD Authorized by: Earleen Newport, MD   Consent:    Consent obtained:  Emergent situation Anesthesia (see MAR for exact dosages):    Anesthesia method:  Local infiltration   Local anesthetic:  Lidocaine 1% WITH epi Laceration details:    Location:  Scalp   Scalp location:  Frontal   Length (cm):  8   Depth (mm):  8 Repair type:    Repair type:  Intermediate Exploration:    Hemostasis achieved with:  Epinephrine   Contaminated: no   Treatment:    Area cleansed with:  Betadine   Amount of cleaning:  Standard   Irrigation solution:  Sterile saline   Visualized foreign bodies/material removed: no   Skin repair:    Repair method:  Sutures   Suture size:  4-0   Suture material:  Prolene   Number of sutures:  12 Approximation:    Approximation:  Close Post-procedure details:    Dressing:  Open (no dressing)   Patient tolerance of procedure:  Tolerated with difficulty Comments:     Difficult laceration for repair due to her dementia    PRUE SHEHATA was evaluated in Emergency Department on 05/25/2019 for the symptoms described in the history of present illness. She was evaluated in the context of the global COVID-19 pandemic, which necessitated consideration that the patient might be at risk for infection with the SARS-CoV-2 virus that  causes COVID-19. Institutional protocols and algorithms that pertain to the evaluation of patients at risk for COVID-19 are in a state of rapid change based on information released by regulatory bodies including the CDC and federal and state organizations. These policies and algorithms were followed during the patient's care in the ED.  ____________________________________________   LABS (pertinent positives/negatives)  Labs Reviewed  CBC WITH DIFFERENTIAL/PLATELET - Abnormal; Notable for the following components:      Result Value   Neutro Abs 7.8 (*)    All other components within normal limits  COMPREHENSIVE METABOLIC PANEL - Abnormal; Notable for the following components:   Glucose, Bld 121 (*)    Alkaline Phosphatase 36 (*)    All other components within normal limits  URINALYSIS, COMPLETE (UACMP) WITH MICROSCOPIC - Abnormal; Notable for the following components:   Color, Urine YELLOW (*)    APPearance CLEAR (*)    Ketones, ur 5 (*)    Protein, ur 30 (*)    All other components within normal limits  RADIOLOGY Images were viewed by me  CT head  IMPRESSION:  Atrophy with stable supratentorial small vessel disease. No acute  infarct. There is a meningioma arising from the anterior right falx  without mass effect or edema. This meningioma may be minimally  larger than on the previous study, although differences are subtle.  No evident hemorrhage.   There are multiple foci of arterial vascular calcification. There is  extensive multifocal paranasal sinus disease.  ____________________________________________   DIFFERENTIAL DIAGNOSIS   Fall, head injury, laceration, subdural, acute blood loss anemia  FINAL ASSESSMENT AND PLAN  Fall, head injury, scalp laceration, dementia   Plan: The patient had presented for an unwitnessed fall in the setting of dementia.  Scalp laceration was repaired as dictated above.  Patient's labs look surprisingly normal. Patient's imaging did  not reveal any acute process.  We have achieved hemostatic control of her laceration.  She is cleared for outpatient follow-up.   Laurence Aly, MD    Note: This note was generated in part or whole with voice recognition software. Voice recognition is usually quite accurate but there are transcription errors that can and very often do occur. I apologize for any typographical errors that were not detected and corrected.     Earleen Newport, MD 05/25/19 [phone]

## 2019-05-25 NOTE — ED Triage Notes (Signed)
Pt found by family this AM with blood on floor and pt, approx. 229ml from unwitnessed fall due to pt being alone at home throughout the night. Pt with hx/o dementia. Pt has dried blood covering face, nightgown and shirt on arrival. Possible laceration seen to the forehead. Pt not A&O at baseline.

## 2019-05-25 NOTE — ED Notes (Signed)
D/c instructions reviewed with pts son. Son is going to get car for patient to be able to get into and will return in 20 mins for pick up.

## 2019-06-02 DIAGNOSIS — E538 Deficiency of other specified B group vitamins: Secondary | ICD-10-CM | POA: Diagnosis not present

## 2019-06-02 DIAGNOSIS — S0101XD Laceration without foreign body of scalp, subsequent encounter: Secondary | ICD-10-CM | POA: Diagnosis not present

## 2019-06-02 DIAGNOSIS — Z23 Encounter for immunization: Secondary | ICD-10-CM | POA: Diagnosis not present

## 2019-06-07 DIAGNOSIS — S0101XD Laceration without foreign body of scalp, subsequent encounter: Secondary | ICD-10-CM | POA: Diagnosis not present

## 2019-07-17 ENCOUNTER — Other Ambulatory Visit: Payer: Self-pay

## 2019-07-17 ENCOUNTER — Emergency Department: Payer: PPO

## 2019-07-17 ENCOUNTER — Inpatient Hospital Stay
Admission: EM | Admit: 2019-07-17 | Discharge: 2019-07-19 | DRG: 177 | Disposition: A | Discharge status: Home / Self Care | Payer: PPO | Attending: Internal Medicine | Admitting: Internal Medicine

## 2019-07-17 DIAGNOSIS — R9431 Abnormal electrocardiogram [ECG] [EKG]: Secondary | ICD-10-CM | POA: Diagnosis present

## 2019-07-17 DIAGNOSIS — F0390 Unspecified dementia without behavioral disturbance: Secondary | ICD-10-CM | POA: Diagnosis not present

## 2019-07-17 DIAGNOSIS — L270 Generalized skin eruption due to drugs and medicaments taken internally: Secondary | ICD-10-CM | POA: Diagnosis present

## 2019-07-17 DIAGNOSIS — Z8701 Personal history of pneumonia (recurrent): Secondary | ICD-10-CM

## 2019-07-17 DIAGNOSIS — G301 Alzheimer's disease with late onset: Secondary | ICD-10-CM | POA: Diagnosis present

## 2019-07-17 DIAGNOSIS — J690 Pneumonitis due to inhalation of food and vomit: Secondary | ICD-10-CM | POA: Diagnosis not present

## 2019-07-17 DIAGNOSIS — J9621 Acute and chronic respiratory failure with hypoxia: Secondary | ICD-10-CM | POA: Diagnosis present

## 2019-07-17 DIAGNOSIS — K5900 Constipation, unspecified: Secondary | ICD-10-CM | POA: Diagnosis not present

## 2019-07-17 DIAGNOSIS — Z20828 Contact with and (suspected) exposure to other viral communicable diseases: Secondary | ICD-10-CM | POA: Diagnosis not present

## 2019-07-17 DIAGNOSIS — R062 Wheezing: Secondary | ICD-10-CM

## 2019-07-17 DIAGNOSIS — R0602 Shortness of breath: Secondary | ICD-10-CM | POA: Diagnosis not present

## 2019-07-17 DIAGNOSIS — T361X5A Adverse effect of cephalosporins and other beta-lactam antibiotics, initial encounter: Secondary | ICD-10-CM | POA: Diagnosis present

## 2019-07-17 DIAGNOSIS — K224 Dyskinesia of esophagus: Secondary | ICD-10-CM | POA: Diagnosis present

## 2019-07-17 DIAGNOSIS — Z7951 Long term (current) use of inhaled steroids: Secondary | ICD-10-CM | POA: Diagnosis not present

## 2019-07-17 DIAGNOSIS — Z825 Family history of asthma and other chronic lower respiratory diseases: Secondary | ICD-10-CM | POA: Diagnosis not present

## 2019-07-17 DIAGNOSIS — J441 Chronic obstructive pulmonary disease with (acute) exacerbation: Secondary | ICD-10-CM | POA: Diagnosis not present

## 2019-07-17 DIAGNOSIS — I10 Essential (primary) hypertension: Secondary | ICD-10-CM | POA: Diagnosis not present

## 2019-07-17 DIAGNOSIS — Z66 Do not resuscitate: Secondary | ICD-10-CM | POA: Diagnosis present

## 2019-07-17 DIAGNOSIS — Y92239 Unspecified place in hospital as the place of occurrence of the external cause: Secondary | ICD-10-CM | POA: Diagnosis present

## 2019-07-17 DIAGNOSIS — J189 Pneumonia, unspecified organism: Secondary | ICD-10-CM | POA: Diagnosis present

## 2019-07-17 DIAGNOSIS — I160 Hypertensive urgency: Secondary | ICD-10-CM | POA: Diagnosis present

## 2019-07-17 DIAGNOSIS — Z79899 Other long term (current) drug therapy: Secondary | ICD-10-CM | POA: Diagnosis not present

## 2019-07-17 DIAGNOSIS — F02C0 Dementia in other diseases classified elsewhere, severe, without behavioral disturbance, psychotic disturbance, mood disturbance, and anxiety: Secondary | ICD-10-CM | POA: Diagnosis present

## 2019-07-17 DIAGNOSIS — J449 Chronic obstructive pulmonary disease, unspecified: Secondary | ICD-10-CM | POA: Diagnosis present

## 2019-07-17 LAB — CBC
HCT: 35.6 % — ABNORMAL LOW (ref 36.0–46.0)
Hemoglobin: 10.9 g/dL — ABNORMAL LOW (ref 12.0–15.0)
MCH: 26 pg (ref 26.0–34.0)
MCHC: 30.6 g/dL (ref 30.0–36.0)
MCV: 84.8 fL (ref 80.0–100.0)
Platelets: 254 10*3/uL (ref 150–400)
RBC: 4.2 MIL/uL (ref 3.87–5.11)
RDW: 13.9 % (ref 11.5–15.5)
WBC: 6.3 10*3/uL (ref 4.0–10.5)
nRBC: 0 % (ref 0.0–0.2)

## 2019-07-17 LAB — BASIC METABOLIC PANEL
Anion gap: 10 (ref 5–15)
BUN: 15 mg/dL (ref 8–23)
CO2: 27 mmol/L (ref 22–32)
Calcium: 9.3 mg/dL (ref 8.9–10.3)
Chloride: 100 mmol/L (ref 98–111)
Creatinine, Ser: 0.7 mg/dL (ref 0.44–1.00)
GFR calc Af Amer: >60 mL/min (ref >60)
GFR calc non Af Amer: >60 mL/min (ref >60)
Glucose, Bld: 96 mg/dL (ref 70–99)
Potassium: 4.3 mmol/L (ref 3.5–5.1)
Sodium: 137 mmol/L (ref 135–145)

## 2019-07-17 MED ORDER — SODIUM CHLORIDE 0.9 % IV SOLN
1.0000 g | Freq: Once | INTRAVENOUS | Status: AC
  Administered 2019-07-17: 1 g via INTRAVENOUS
  Filled 2019-07-17: qty 10

## 2019-07-17 MED ORDER — METHYLPREDNISOLONE SODIUM SUCC 125 MG IJ SOLR
125.0000 mg | Freq: Once | INTRAMUSCULAR | Status: AC
  Administered 2019-07-17: 125 mg via INTRAVENOUS
  Filled 2019-07-17: qty 2

## 2019-07-17 MED ORDER — SODIUM CHLORIDE 0.9 % IV SOLN
500.0000 mg | Freq: Once | INTRAVENOUS | Status: AC
  Administered 2019-07-17: 500 mg via INTRAVENOUS
  Filled 2019-07-17: qty 500

## 2019-07-17 MED ORDER — PREDNISONE 20 MG PO TABS: 40.0000 mg | ORAL_TABLET | Freq: Every day | ORAL | 0 refills | Status: DC

## 2019-07-17 MED ORDER — LABETALOL HCL 5 MG/ML IV SOLN
5.0000 mg | Freq: Once | INTRAVENOUS | Status: AC
  Administered 2019-07-18: 5 mg via INTRAVENOUS

## 2019-07-17 MED ORDER — IPRATROPIUM-ALBUTEROL 0.5-2.5 (3) MG/3ML IN SOLN
3.0000 mL | RESPIRATORY_TRACT | Status: DC
  Administered 2019-07-18: 3 mL via RESPIRATORY_TRACT

## 2019-07-17 MED ORDER — LEVALBUTEROL HCL 1.25 MG/0.5ML IN NEBU
1.2500 mg | INHALATION_SOLUTION | RESPIRATORY_TRACT | Status: DC | PRN
  Filled 2019-07-17: qty 0.5

## 2019-07-17 MED ORDER — ENOXAPARIN SODIUM 40 MG/0.4ML ~~LOC~~ SOLN
40.0000 mg | SUBCUTANEOUS | Status: DC
  Administered 2019-07-18 – 2019-07-19 (×2): 40 mg via SUBCUTANEOUS
  Filled 2019-07-17 (×2): qty 0.4

## 2019-07-17 MED ORDER — AZITHROMYCIN 250 MG PO TABS: ORAL_TABLET | ORAL | 0 refills | Status: DC

## 2019-07-17 NOTE — ED Notes (Addendum)
Pt son states that about 2 days ago pt began to have labored breathing. Son states that pt has history of COPD, and previously had aspiration pneumonia which caused the same symptoms. Expiratory wheezes are audible, son states that wheezes are always present but they have increased over the past few days. Son states that pt also has seasonal allergies which may be adding to her labored breathing. This nurse heard productive cough while at bedside. Pt did not speak but smiled and nodded her head. Pt BP is 220/90, son states that pt has hx of HTN and she is due for her nighttime BP medication.

## 2019-07-17 NOTE — H&P (Addendum)
History and Physical    RETIA BOWLEN [id number] DOB: 06-Nov-1925 DOA: 07/17/2019  PCP: Baxter Hire, MD  Patient coming from: Home, lives with son  I have personally briefly reviewed patient's old medical records in [place]  Chief Complaint: Increased shortness of breath, wheezing  HPI: JORDAYN SALENTINE is a 85 y.o. female with medical history significant of dementia, COPD, hx of intubation in the setting of pneumonia who presents with shortness of breath and wheezing.  Son provides history over the phone given patient's dementia. He reports that about 3 days ago patient began to have increasing shortness of breath and wheezing.  He was giving her breathing treatments about every 3 hours which did help her to cough and produce more sputum but did not relieve her shortness of breath.  He also notes that she had an elevate temperature of 100.1.  She also has been constipated.  Has not had a bowel movement for 2 days and the last bowel movement produced very little minimal stool.  ED Course: Patient was afebrile but tachycardic up to 100 and was hypotensive up to 200s over 102.  CBC showed no leukocytosis but hemoglobin of 10.9.  BMP unremarkable. Chest x-ray showed new small focus of patchy airspace opacity in the periphery of the right lung base which could be atelectasis versus infectious etiology.  Review of Systems: Unable to obtain due to patient's dementia  Past Medical History:  Diagnosis Date  . Asthma   . COPD (chronic obstructive pulmonary disease) ([place])   . Herpes zoster   . Hypertension   . Neuralgia     Past Surgical History:  Procedure Laterality Date  . LEG SURGERY       reports that she has never smoked. She has never used smokeless tobacco. She reports that she does not drink alcohol or use drugs.  Allergies  Allergen Reactions  . Aspirin Shortness Of Breath  . Nsaids Shortness Of Breath and Anaphylaxis    Throat swelling, shortness of breath   . Sulfa Antibiotics Other (See Comments)    Stomach pain Stomach pain   . Codeine Other (See Comments)    Short of breath   . Ciprofloxacin Hives  . Penicillins     Has patient had a PCN reaction causing immediate rash, facial/tongue/throat swelling, SOB or lightheadedness with hypotension: Unknown Has patient had a PCN reaction causing severe rash involving mucus membranes or skin necrosis: Unknown Has patient had a PCN reaction that required hospitalization: Unknown Has patient had a PCN reaction occurring within the last 10 years: Unknown If all of the above answers are "NO", then may proceed with Cephalosporin use.  Other reaction(s): Angioedema (ALLERGY/intolerance) Hives, swelling  . Phenol     Other reaction(s): Angioedema (ALLERGY/intolerance), Other (See Comments) Spasms, nerve symptoms Spasms, nerve symptoms  Other reaction(s): Angioedema (ALLERGY/intolerance) Spasms, nerve symptoms  . Promethazine Hcl     Family History  Problem Relation Age of Onset  . COPD Sister    Family history reviewed and not pertinent   Prior to Admission medications   Medication Sig Start Date End Date Taking? Authorizing Provider  acetaminophen (TYLENOL) 650 MG CR tablet Take 650 mg by mouth every 8 (eight) hours as needed for pain.   Yes [provider]  albuterol (PROVENTIL) (2.5 MG/3ML) 0.083% nebulizer solution Take 3 mLs (2.5 mg total) by nebulization every 3 (three) hours as needed for wheezing or shortness of breath. 09/21/18  Yes Epifanio Lesches, MD  cyanocobalamin (,  VITAMIN B-12,) 1000 MCG/ML injection Inject 1,000 mcg into the skin every 30 (thirty) days.    Yes [provider]  Fluticasone-Salmeterol (ADVAIR) 500-50 MCG/DOSE AEPB Inhale 1 puff into the lungs 2 (two) times daily.   Yes [provider]  metoprolol tartrate (LOPRESSOR) 25 MG tablet Take 1 tablet (25 mg total) by mouth 2 (two) times daily. 08/20/17  Yes Gladstone Lighter, MD   polyethylene glycol (MIRALAX / GLYCOLAX) packet Take 17 g by mouth daily as needed.   Yes [provider]  tiotropium (SPIRIVA HANDIHALER) 18 MCG inhalation capsule Place 1 capsule (18 mcg total) into inhaler and inhale daily. 02/23/18 07/17/19 Yes Sainani, Belia Heman, MD  traMADol (ULTRAM) 50 MG tablet Take 1 tablet (50 mg total) by mouth 2 (two) times daily. 09/22/18  Yes Epifanio Lesches, MD  azithromycin (ZITHROMAX Z-PAK) 250 MG tablet Take 2 tablets (500 mg) on  Day 1,  followed by 1 tablet (250 mg) once daily on Days 2 through 5. 07/17/19 07/22/19  Nance Pear, MD  predniSONE (DELTASONE) 20 MG tablet Take 2 tablets (40 mg total) by mouth daily. 07/17/19   Nance Pear, MD    Physical Exam: Vitals:   07/17/19 2000 07/17/19 2030 07/17/19 2100 07/17/19 2130  BP: (!) 197/108 (!) 197/166 (!) 191/100 (!) 157/125  Pulse: 81 80 86 89  Resp: (!) 22 20 20 19   Temp:      TempSrc:      SpO2: 98% 96% 98% 95%    Constitutional: NAD, calm, comfortable, thin elderly female sitting up in bed and well-appearing. Vitals:   07/17/19 2000 07/17/19 2030 07/17/19 2100 07/17/19 2130  BP: (!) 197/108 (!) 197/166 (!) 191/100 (!) 157/125  Pulse: 81 80 86 89  Resp: (!) 22 20 20 19   Temp:      TempSrc:      SpO2: 98% 96% 98% 95%   Eyes: PERRL, lids and conjunctivae normal ENMT: Mucous membranes are moist. Posterior pharynx clear of any exudate or lesions. Neck: normal, supple, no masses Respiratory: Audible wheezing, poor aeration and diffuse expiratory wheezes on auscultation. Normal respiratory effort. No accessory muscle use.  Cardiovascular:  Tachycardic with rates of 100 on telemetry and normal rhythm, no murmurs / rubs / gallops. No extremity edema. 2+ pedal pulses. No carotid bruits.  Abdomen: no tenderness, no masses palpated. Bowel sounds positive.  Musculoskeletal: no clubbing / cyanosis. Bony deformity of the left knee. Skin: no rashes, lesions, ulcers. No  induration Neurologic: CN 2-12 grossly intact. Sensation intact.  Strength 4/5 in Of the lower extremity. Psychiatric: Dementia. Alert and oriented x 2 self and location. Normal mood.     Labs on Admission: I have personally reviewed following labs and imaging studies  CBC: Recent Labs  Lab 07/17/19 1813  WBC 6.3  HGB 10.9*  HCT 35.6*  MCV 84.8  PLT 0000000   Basic Metabolic Panel: Recent Labs  Lab 07/17/19 1813  NA 137  K 4.3  CL 100  CO2 27  GLUCOSE 96  BUN 15  CREATININE 0.70  CALCIUM 9.3   GFR: CrCl cannot be calculated (Unknown ideal weight.). Liver Function Tests: No results for input(s): AST, ALT, ALKPHOS, BILITOT, PROT, ALBUMIN in the last 168 hours. No results for input(s): LIPASE, AMYLASE in the last 168 hours. No results for input(s): AMMONIA in the last 168 hours. Coagulation Profile: No results for input(s): INR, PROTIME in the last 168 hours. Cardiac Enzymes: No results for input(s): CKTOTAL, CKMB, CKMBINDEX, TROPONINI in the  last 168 hours. BNP (last 3 results) No results for input(s): PROBNP in the last 8760 hours. HbA1C: No results for input(s): HGBA1C in the last 72 hours. CBG: No results for input(s): GLUCAP in the last 168 hours. Lipid Profile: No results for input(s): CHOL, HDL, LDLCALC, TRIG, CHOLHDL, LDLDIRECT in the last 72 hours. Thyroid Function Tests: No results for input(s): TSH, T4TOTAL, FREET4, T3FREE, THYROIDAB in the last 72 hours. Anemia Panel: No results for input(s): VITAMINB12, FOLATE, FERRITIN, TIBC, IRON, RETICCTPCT in the last 72 hours. Urine analysis:    Component Value Date/Time   COLORURINE YELLOW (A) 05/25/2019 0721   APPEARANCEUR CLEAR (A) 05/25/2019 0721   LABSPEC 1.013 05/25/2019 0721   PHURINE 5.0 05/25/2019 0721   GLUCOSEU NEGATIVE 05/25/2019 0721   HGBUR NEGATIVE 05/25/2019 0721   BILIRUBINUR NEGATIVE 05/25/2019 0721   KETONESUR 5 (A) 05/25/2019 0721   PROTEINUR 30 (A) 05/25/2019 0721   UROBILINOGEN 0.2  06/27/2010 1353   NITRITE NEGATIVE 05/25/2019 0721   LEUKOCYTESUR NEGATIVE 05/25/2019 0721    Radiological Exams on Admission: Dg Chest Port 1 View  Result Date: 07/17/2019 CLINICAL DATA:  Shortness of breath and wheezing EXAM: PORTABLE CHEST 1 VIEW COMPARISON:  September 17, 2018 FINDINGS: The heart size and mediastinal contours are unchanged. Aortic knob calcifications are seen. There are coarsened interstitial markings seen at both lung bases as on the prior exam. There is however a new small patchy focus of opacity seen at the periphery of the right lung base. There is hyperinflation of the lung zones. No acute osseous abnormality. IMPRESSION: New small focus of patchy airspace opacity in the periphery of the right lung base which could be due to atelectasis and/or early infectious etiology. Chronic bibasilar coarsened interstitial markings. Electronically Signed   By: Prudencio Pair M.D.   On: 07/17/2019 19:17    EKG: Independently reviewed.   Assessment/Plan  Community-acquired pneumonia -Patient reportedly had elevated temperature of 100.1 at home and worsening shortness of breath and coughing.  Patient is afebrile on admission and does not have elevated WBC. -Chest x-ray shows questionable right lung base atelectasis versus pneumonia -will continue IV Rocephin and azithromycin but will check procalcitonin  COPD exacerbation secondary to questionable pneumonia -Continue duo nebs every 4 scheduled -As needed levalbuterol every 2 hours given mild tachycardia -Continue daily IV methylprednisone 60mg  - continue home ICS bronchodilator  Hypertension -Continue metoprolol - will give one time dose of IV 5mg  Labetalol for elevated BP  Constipation - start daily miralax     DVT prophylaxis:.Lovenox Code Status: Limited DNR- Son does not want CPR but ALLOW INTUBATION Family Communication: Plan discussed with patient at bedside  disposition Plan: Home with at least 2 midnight stays   Consults called:  Admission status: inpatient  Jahnaya Branscome T Hien Perreira DO [facility]   If 7PM-7AM, please contact night-coverage [email or web address] Password [place]  07/17/2019, 11:49 PM

## 2019-07-17 NOTE — ED Provider Notes (Signed)
[facility] Emergency Department Provider Note  ____________________________________________   I have reviewed the triage vital signs and the nursing notes.   HISTORY  Chief Complaint Shortness of Breath   History limited by: Not Limited   HPI Janice Hoffman is a 85 y.o. female who presents to the emergency department today because of concern for shortness of breath. Family states that the shortness of breath started getting bad 2 days ago. She does have COPD and has some breathing difficulty at baseline. They have been using home nebulizer and inhalers without significant improvement. Patient had low grade temperature today. Has had pneumonia in the past. No known COVID contacts.   Records reviewed. Per medical record review patient has a history of COPD.  Past Medical History:  Diagnosis Date  . Asthma   . COPD (chronic obstructive pulmonary disease) ([place])   . Herpes zoster   . Hypertension   . Neuralgia     Patient Active Problem List   Diagnosis Date Noted  . Acute respiratory distress   . Dementia without behavioral disturbance ([place])   . Pneumonia of both lower lobes due to infectious organism   . Palliative care by specialist   . Goals of care, counseling/discussion   . Protein-calorie malnutrition, severe 09/16/2018  . Sepsis due to pneumonia ([place]) 09/15/2018  . Sepsis ([place]) 07/18/2018  . Acute exacerbation of chronic obstructive pulmonary disease (COPD) ([place]) 03/14/2018  . Acute on chronic respiratory failure with hypoxemia ([place]) 02/22/2018  . CAP (community acquired pneumonia) 08/18/2017  . New onset a-fib ([place]) 06/15/2017  . Near syncope 05/31/2017  . COPD with acute exacerbation ([place]) 01/05/2017  . Essential hypertension, malignant 01/05/2017  . Dysphagia 01/05/2017  . COPD exacerbation ([place]) 03/15/2016  . Osteoarthritis 10/12/2014  . Osteoporosis, post-menopausal 10/12/2014  . Pernicious anemia 06/07/2014  . Vitamin D deficiency  06/07/2014  . Post-traumatic osteoarthritis of left knee 05/24/2014  . Pain in joint, lower leg 08/03/2013  . 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 ELEVATED BLOOD PRESSURE 05/12/2008  . HERPES ZOSTER 04/26/2008  . B12 DEFICIENCY 04/26/2008  . CONSTIPATION 04/26/2008  . LEG EDEMA, BILATERAL 04/26/2008    Past Surgical History:  Procedure Laterality Date  . LEG SURGERY      Prior to Admission medications   Medication Sig Start Date End Date Taking? Authorizing Provider  acetaminophen (TYLENOL) 650 MG CR tablet Take 650 mg by mouth every 8 (eight) hours as needed for pain.   Yes [provider]  albuterol (PROVENTIL) (2.5 MG/3ML) 0.083% nebulizer solution Take 3 mLs (2.5 mg total) by nebulization every 3 (three) hours as needed for wheezing or shortness of breath. 09/21/18  Yes Epifanio Lesches, MD  cyanocobalamin (,VITAMIN B-12,) 1000 MCG/ML injection Inject 1,000 mcg into the skin every 30 (thirty) days.    Yes [provider]  Fluticasone-Salmeterol (ADVAIR) 500-50 MCG/DOSE AEPB Inhale 1 puff into the lungs 2 (two) times daily.   Yes [provider]  metoprolol tartrate (LOPRESSOR) 25 MG tablet Take 1 tablet (25 mg total) by mouth 2 (two) times daily. 08/20/17  Yes Gladstone Lighter, MD  polyethylene glycol (MIRALAX / GLYCOLAX) packet Take 17 g by mouth daily as needed.   Yes [provider]  tiotropium (SPIRIVA HANDIHALER) 18 MCG inhalation capsule Place 1 capsule (18 mcg total) into inhaler and inhale daily. 02/23/18 07/17/19 Yes Sainani, Belia Heman, MD  traMADol (ULTRAM) 50 MG tablet Take 1 tablet (50 mg total) by mouth 2 (two) times daily. 09/22/18  Yes  Epifanio Lesches, MD    Allergies Aspirin, Nsaids, Sulfa antibiotics, Codeine, Ciprofloxacin, Penicillins, Phenol, and Promethazine hcl  Family History  Problem Relation Age of Onset  . COPD Sister     Social History Social History   Tobacco Use  . Smoking status: Never Smoker  .  Smokeless tobacco: Never Used  Substance Use Topics  . Alcohol use: No    Alcohol/week: 0.0 standard drinks  . Drug use: No    Review of Systems Constitutional: Positive for fever.  Eyes: No visual changes. ENT: No sore throat. Cardiovascular: Denies chest pain. Respiratory: Positive for shortness of breath.  Gastrointestinal: No abdominal pain.  No nausea, no vomiting.  No diarrhea.   Genitourinary: Negative for dysuria. Musculoskeletal: Negative for back pain. Skin: Negative for rash. Neurological: Negative for headaches, focal weakness or numbness.  ____________________________________________   PHYSICAL EXAM:  VITAL SIGNS: ED Triage Vitals [07/17/19 1751]  Enc Vitals Group     BP (!) 205/90     Pulse Rate 73     Resp 20     Temp 98.1 F (36.7 C)     Temp Source Oral     SpO2 96 %   Constitutional: Alert and oriented.  Eyes: Conjunctivae are normal.  ENT      Head: Normocephalic and atraumatic.      Nose: No congestion/rhinnorhea.      Mouth/Throat: Mucous membranes are moist.      Neck: No stridor. Hematological/Lymphatic/Immunilogical: No cervical lymphadenopathy. Cardiovascular: Normal rate, regular rhythm.  No murmurs, rubs, or gallops.  Respiratory: Increased respiratory effort. Wheezing.  Gastrointestinal: Soft and non tender. No rebound. No guarding.  Genitourinary: Deferred Musculoskeletal: Normal range of motion in all extremities. No lower extremity edema. Neurologic:  Normal speech and language. No gross focal neurologic deficits are appreciated.  Skin:  Skin is warm, dry and intact. No rash noted. Psychiatric: Mood and affect are normal. Speech and behavior are normal. Patient exhibits appropriate insight and judgment.  ____________________________________________    LABS (pertinent positives/negatives)  CBC wbc 6.3, hgb 10.9, plt 254 BMP wnl  ____________________________________________   EKG  I, Nance Pear, attending physician,  personally viewed and interpreted this EKG  EKG Time: 1806 Rate: 71 Rhythm: sinus rhythm Axis: left axis deviation Intervals: qtc 465 QRS: LVH ST changes: no st elevation Impression: abnormal ekg  ____________________________________________    RADIOLOGY  CXR Right lung base atelectasis vs infection  ____________________________________________   PROCEDURES  Procedures  ____________________________________________   INITIAL IMPRESSION / ASSESSMENT AND PLAN / ED COURSE  Pertinent labs & imaging results that were available during my care of the patient were reviewed by me and considered in my medical decision making (see chart for details).   Patient presented to the emergency department today because of concerns for shortness of breath.  On exam patient does have significant expiratory wheezing.  Chest x-ray is concerning for pneumonia.  Patient and patient's son initially thought they might feel comfortable going home and treating however upon further discussion they decided they would feel more comfortable with admission.  I do think this is completely reasonable given pneumonia and increased wheezing.  ____________________________________________   FINAL CLINICAL IMPRESSION(S) / ED DIAGNOSES  Final diagnoses:  Wheezing  SOB (shortness of breath)     Note: This dictation was prepared with Dragon dictation. Any transcriptional errors that result from this process are unintentional     Nance Pear, MD 07/18/19 1724

## 2019-07-17 NOTE — ED Triage Notes (Addendum)
Pt arrived via POV with reports of shortness of breath, low grade fever since Thursday. Worse SOB today per son.  Son believes pt may have aspirated, pt has hx of aspiration PNA in Jan 2020, pt also has hx of COPD.  Pt had neb tx x 3-4 today but continues to wheeze.   Pt has caregiver in the home during the day.

## 2019-07-17 NOTE — ED Notes (Signed)
Pt's son out to desk due to concern over pt's breathing. MD Archie Balboa aware.

## 2019-07-18 DIAGNOSIS — J690 Pneumonitis due to inhalation of food and vomit: Secondary | ICD-10-CM | POA: Diagnosis present

## 2019-07-18 DIAGNOSIS — F0390 Unspecified dementia without behavioral disturbance: Secondary | ICD-10-CM

## 2019-07-18 DIAGNOSIS — J9621 Acute and chronic respiratory failure with hypoxia: Secondary | ICD-10-CM

## 2019-07-18 DIAGNOSIS — K5900 Constipation, unspecified: Secondary | ICD-10-CM | POA: Diagnosis present

## 2019-07-18 LAB — CBC
HCT: 32.7 % — ABNORMAL LOW (ref 36.0–46.0)
Hemoglobin: 10.6 g/dL — ABNORMAL LOW (ref 12.0–15.0)
MCH: 26.3 pg (ref 26.0–34.0)
MCHC: 32.4 g/dL (ref 30.0–36.0)
MCV: 81.1 fL (ref 80.0–100.0)
Platelets: 213 10*3/uL (ref 150–400)
RBC: 4.03 MIL/uL (ref 3.87–5.11)
RDW: 14 % (ref 11.5–15.5)
WBC: 3.7 10*3/uL — ABNORMAL LOW (ref 4.0–10.5)
nRBC: 0 % (ref 0.0–0.2)

## 2019-07-18 LAB — BASIC METABOLIC PANEL
Anion gap: 9 (ref 5–15)
BUN: 12 mg/dL (ref 8–23)
CO2: 26 mmol/L (ref 22–32)
Calcium: 9 mg/dL (ref 8.9–10.3)
Chloride: 103 mmol/L (ref 98–111)
Creatinine, Ser: 0.53 mg/dL (ref 0.44–1.00)
GFR calc Af Amer: >60 mL/min (ref >60)
GFR calc non Af Amer: >60 mL/min (ref >60)
Glucose, Bld: 149 mg/dL — ABNORMAL HIGH (ref 70–99)
Potassium: 3.9 mmol/L (ref 3.5–5.1)
Sodium: 138 mmol/L (ref 135–145)

## 2019-07-18 LAB — PROCALCITONIN: Procalcitonin: <0.1 ng/mL

## 2019-07-18 LAB — SARS CORONAVIRUS 2 (TAT 6-24 HRS): SARS Coronavirus 2: NEGATIVE

## 2019-07-18 MED ORDER — SODIUM CHLORIDE 0.9 % IV BOLUS
500.0000 mL | Freq: Once | INTRAVENOUS | Status: AC
  Administered 2019-07-18: 500 mL via INTRAVENOUS

## 2019-07-18 MED ORDER — CLINDAMYCIN PHOSPHATE 600 MG/50ML IV SOLN
600.0000 mg | Freq: Three times a day (TID) | INTRAVENOUS | Status: DC
  Administered 2019-07-18 – 2019-07-19 (×4): 600 mg via INTRAVENOUS
  Filled 2019-07-18 (×7): qty 50

## 2019-07-18 MED ORDER — SODIUM CHLORIDE 0.9 % IV SOLN
1.0000 g | INTRAVENOUS | Status: DC
  Filled 2019-07-18: qty 10

## 2019-07-18 MED ORDER — AMLODIPINE BESYLATE 5 MG PO TABS
5.0000 mg | ORAL_TABLET | Freq: Every day | ORAL | Status: DC
  Administered 2019-07-18: 5 mg via ORAL
  Filled 2019-07-18 (×2): qty 1

## 2019-07-18 MED ORDER — METRONIDAZOLE IN NACL 5-0.79 MG/ML-% IV SOLN
500.0000 mg | Freq: Three times a day (TID) | INTRAVENOUS | Status: DC
  Filled 2019-07-18 (×2): qty 100

## 2019-07-18 MED ORDER — DEXTROMETHORPHAN POLISTIREX ER 30 MG/5ML PO SUER
30.0000 mg | Freq: Two times a day (BID) | ORAL | Status: DC
  Administered 2019-07-18 – 2019-07-19 (×2): 30 mg via ORAL
  Filled 2019-07-18 (×7): qty 5

## 2019-07-18 MED ORDER — IPRATROPIUM-ALBUTEROL 0.5-2.5 (3) MG/3ML IN SOLN
3.0000 mL | RESPIRATORY_TRACT | Status: DC
  Administered 2019-07-18 – 2019-07-19 (×3): 3 mL via RESPIRATORY_TRACT
  Filled 2019-07-18 (×3): qty 3

## 2019-07-18 MED ORDER — LABETALOL HCL 5 MG/ML IV SOLN
INTRAVENOUS | Status: AC
  Filled 2019-07-18: qty 4

## 2019-07-18 MED ORDER — POLYETHYLENE GLYCOL 3350 17 G PO PACK
17.0000 g | PACK | Freq: Every day | ORAL | Status: DC
  Administered 2019-07-18 – 2019-07-19 (×2): 17 g via ORAL
  Filled 2019-07-18 (×3): qty 1

## 2019-07-18 MED ORDER — METHYLPREDNISOLONE SODIUM SUCC 125 MG IJ SOLR
60.0000 mg | Freq: Every day | INTRAMUSCULAR | Status: DC
  Filled 2019-07-18: qty 2
  Filled 2019-07-18: qty 0.96
  Filled 2019-07-18: qty 2

## 2019-07-18 MED ORDER — LABETALOL HCL 5 MG/ML IV SOLN
10.0000 mg | INTRAVENOUS | Status: DC | PRN
  Filled 2019-07-18: qty 4

## 2019-07-18 MED ORDER — LEVALBUTEROL HCL 0.63 MG/3ML IN NEBU
0.6300 mg | INHALATION_SOLUTION | Freq: Four times a day (QID) | RESPIRATORY_TRACT | Status: DC | PRN
  Filled 2019-07-18: qty 3

## 2019-07-18 MED ORDER — LABETALOL HCL 5 MG/ML IV SOLN: 5.0000 mg | INTRAVENOUS | Status: DC | PRN

## 2019-07-18 MED ORDER — IPRATROPIUM BROMIDE HFA 17 MCG/ACT IN AERS
2.0000 | INHALATION_SPRAY | RESPIRATORY_TRACT | Status: DC
  Administered 2019-07-18: 2 via RESPIRATORY_TRACT
  Filled 2019-07-18: qty 12.9

## 2019-07-18 MED ORDER — GUAIFENESIN ER 600 MG PO TB12
600.0000 mg | ORAL_TABLET | Freq: Two times a day (BID) | ORAL | Status: DC
  Administered 2019-07-18 – 2019-07-19 (×3): 600 mg via ORAL
  Filled 2019-07-18 (×3): qty 1

## 2019-07-18 MED ORDER — DIPHENHYDRAMINE HCL 25 MG PO TABS
12.5000 mg | ORAL_TABLET | Freq: Four times a day (QID) | ORAL | Status: DC | PRN
  Filled 2019-07-18 (×2): qty 0.5

## 2019-07-18 MED ORDER — DM-GUAIFENESIN ER 30-600 MG PO TB12: 1.0000 | ORAL_TABLET | Freq: Two times a day (BID) | ORAL | Status: DC

## 2019-07-18 MED ORDER — METHYLPREDNISOLONE SODIUM SUCC 40 MG IJ SOLR
40.0000 mg | Freq: Every day | INTRAMUSCULAR | Status: DC
  Administered 2019-07-19: 40 mg via INTRAVENOUS
  Filled 2019-07-18: qty 1

## 2019-07-18 MED ORDER — SODIUM CHLORIDE 0.9% FLUSH
3.0000 mL | Freq: Two times a day (BID) | INTRAVENOUS | Status: DC
  Administered 2019-07-18 – 2019-07-19 (×2): 3 mL via INTRAVENOUS

## 2019-07-18 MED ORDER — METOPROLOL TARTRATE 25 MG PO TABS
25.0000 mg | ORAL_TABLET | Freq: Two times a day (BID) | ORAL | Status: DC
  Administered 2019-07-18 – 2019-07-19 (×3): 25 mg via ORAL
  Filled 2019-07-18: qty 0.5
  Filled 2019-07-18 (×2): qty 1
  Filled 2019-07-18: qty 0.5
  Filled 2019-07-18 (×2): qty 1

## 2019-07-18 MED ORDER — SODIUM CHLORIDE 0.9 % IV SOLN
500.0000 mg | INTRAVENOUS | Status: DC
  Filled 2019-07-18: qty 500

## 2019-07-18 MED ORDER — MOMETASONE FURO-FORMOTEROL FUM 200-5 MCG/ACT IN AERO
2.0000 | INHALATION_SPRAY | Freq: Two times a day (BID) | RESPIRATORY_TRACT | Status: DC
  Administered 2019-07-18 – 2019-07-19 (×3): 2 via RESPIRATORY_TRACT
  Filled 2019-07-18: qty 8.8

## 2019-07-18 MED ORDER — LEVALBUTEROL HCL 1.25 MG/0.5ML IN NEBU
1.2500 mg | INHALATION_SOLUTION | Freq: Four times a day (QID) | RESPIRATORY_TRACT | Status: DC | PRN
  Filled 2019-07-18: qty 0.5

## 2019-07-18 MED ORDER — SENNA 8.6 MG PO TABS: 1.0000 | ORAL_TABLET | Freq: Every day | ORAL | Status: DC | PRN

## 2019-07-18 MED ORDER — IPRATROPIUM-ALBUTEROL 0.5-2.5 (3) MG/3ML IN SOLN
RESPIRATORY_TRACT | Status: AC
  Filled 2019-07-18: qty 3

## 2019-07-18 MED ORDER — SENNA 8.6 MG PO TABS
1.0000 | ORAL_TABLET | Freq: Every day | ORAL | Status: DC
  Administered 2019-07-18 – 2019-07-19 (×2): 8.6 mg via ORAL
  Filled 2019-07-18 (×2): qty 1

## 2019-07-18 MED ORDER — SODIUM CHLORIDE 0.9 % IV SOLN
INTRAVENOUS | Status: DC | PRN
  Administered 2019-07-18 – 2019-07-19 (×2): 250 mL via INTRAVENOUS

## 2019-07-18 NOTE — ED Notes (Signed)
Report given to Dawn RN

## 2019-07-18 NOTE — Progress Notes (Signed)
PROGRESS NOTE    Janice Hoffman  [id number] DOB: August 26, 1926 DOA: 07/17/2019 PCP: Baxter Hire, MD    Brief Narrative:   Janice Hoffman is a 85 y.o. female with medical history significant of dementia, COPD, hx of intubation in the setting of pneumonia who presents with shortness of breath and wheezing.  Son provides history over the phone given patient's dementia. He reports that about 3 days ago patient began to have increasing shortness of breath and wheezing.  He was giving her breathing treatments about every 3 hours which did help her to cough and produce more sputum but did not relieve her shortness of breath.  He also notes that she had an elevate temperature of 100.1.  She also has been constipated.  Has not had a bowel movement for 2 days and the last bowel movement produced very little minimal stool.  ED Course: Patient was afebrile but tachycardic up to 100 and was hypotensive up to 200s over 102.  CBC showed no leukocytosis but hemoglobin of 10.9.  BMP unremarkable. Chest x-ray showed new small focus of patchy airspace opacity in the periphery of the right lung base which could be atelectasis versus infectious etiology.  Interim History: 1. Had oxygen destat to 92%. 2. 11/8 patient developed allergic reaction with rash in arms, possibly due to Rocephin, less likely due to azithromycin.  Discussed with the pharmacist, switched antibiotics to clindamycin which will cover aspiration pneumonia. 3. Will get SLP  Assessment & Plan:   Principal Problem:   Acute on chronic respiratory failure with hypoxemia (HCC) Active Problems:   COPD with acute exacerbation (HCC)   Essential hypertension   CAP (community acquired pneumonia)   Dementia without behavioral disturbance (HCC)   Shortness of breath   Constipation   Aspiration pneumonia (HCC)   Addendum: pt developed allergic reaction, possibly due to rocephin, less likely due to azithromycin.  Flagyl was ordered, but  was not given yet.  Wiil discussed with pharmacist, and switch to clindamycin -prn Benadryl  Acute on chronic respiratory failure with hypoxemia: likely due to COPD exacerbation and possible aspiration PNA vs. CAP. Pt had hx of aspiration PAN. Now CXR showed small patch infiltration in right base. Patient reportedly had elevated temperature of 100.1 at home, no fever this AM. Covid 19 negative.  - IV Vancomycin and cefepime, Zosyn Aztreonam.  - pt was given IV Rocephin and azithromycin --> will switch to Rocephin and Flagyl -->swithed to clidamycine - Mucinex for cough  - Solu-Medrol 60 mg daily --will decrease to 40 mg daily - DuoNeb, Xopenex Neb prn for SOB - Urine legionella and S. pneumococcal antigen - Follow up blood culture x2, sputum culture - get SLP - pt/OT  Hypertension and hypertensive urgency: bp is elevated, up to 191/91 -Continue metoprolol -Add amlodipine 5 mg daily -prn labetalol for elevated BP  Constipation - miralax and senokot  Dementia without behavioral disturbance ([place]): no behaviral change. -observe and monitoring.  DVT prophylaxis: Lovenox Code Status: DNR (confirmed with her son) Family Communication: yes, called her son by phone Disposition Plan:  Barriers for discharge:    Inpatient status:  # Patient requires inpatient status due to high intensity of service, high risk for further deterioration and high frequency of surveillance required.  I certify that at the point of admission it is my clinical judgment that the patient will require inpatient hospital care spanning beyond 2 midnights from the point of admission.  . This patient has multiple  chronic comorbidities including COPD, hypertension, asthma, dementia, aspiration pneumonia . Now patient has presenting with acute respiratory failure with hypoxia due to COPD exacerbation and possible aspiration pneumonia versus CAP.  Patient also has significant elevated blood pressure, hypertensive  urgency, 191/91. Marland Kitchen The worrisome physical exam findings include has wheezing on auscultation. . The initial radiographic and laboratory data are worrisome because of x-ray showed patchy infiltrate seen in the right base. . Current medical needs: please see my assessment and plan . Predictability of an adverse outcome (risk): Penis multiple comorbidities, now presents with acute respiratory failure with hypoxia due to COPD exacerbation and possible aspiration pneumonia versus CAP.  Patient also has significant elevated blood pressure, hypertensive urgency, 191/91.  Her presentation is highly complicated.  Given her old age, patient is at high risk of deteriorating.  Will need to be treated in hospital for at least 2 days    Consultants:   none  Procedures:    Antimicrobials:  Anti-infectives (From admission, onward)   Start     Dose/Rate Route Frequency Ordered Stop   07/18/19 2200  cefTRIAXone (ROCEPHIN) 1 g in sodium chloride 0.9 % 100 mL IVPB  Status:  Discontinued     1 g 200 mL/hr over 30 Minutes Intravenous Every 24 hours 07/18/19 0022 07/18/19 1128   07/18/19 2200  azithromycin (ZITHROMAX) 500 mg in sodium chloride 0.9 % 250 mL IVPB  Status:  Discontinued     500 mg 250 mL/hr over 60 Minutes Intravenous Every 24 hours 07/18/19 0022 07/18/19 1128   07/18/19 2200  cefTRIAXone (ROCEPHIN) 1 g in sodium chloride 0.9 % 100 mL IVPB  Status:  Discontinued     1 g 200 mL/hr over 30 Minutes Intravenous Every 24 hours 07/18/19 1152 07/18/19 1306   07/18/19 1400  clindamycin (CLEOCIN) IVPB 600 mg     600 mg 100 mL/hr over 30 Minutes Intravenous Every 8 hours 07/18/19 1306     07/18/19 1200  metroNIDAZOLE (FLAGYL) IVPB 500 mg  Status:  Discontinued     500 mg 100 mL/hr over 60 Minutes Intravenous Every 8 hours 07/18/19 1152 07/18/19 1306   07/17/19 2300  cefTRIAXone (ROCEPHIN) 1 g in sodium chloride 0.9 % 100 mL IVPB     1 g 200 mL/hr over 30 Minutes Intravenous  Once 07/17/19 2252  07/18/19 0017   07/17/19 2100  azithromycin (ZITHROMAX) 500 mg in sodium chloride 0.9 % 250 mL IVPB     500 mg 250 mL/hr over 60 Minutes Intravenous  Once 07/17/19 2055 07/17/19 2210   07/17/19 0000  azithromycin (ZITHROMAX Z-PAK) 250 MG tablet        07/17/19 2150 07/22/19 2359      Subjective: Has productive cough, SOB. No chest pain. No fever this AM. Has constipation.  Objective: Vitals:   07/18/19 0737 07/18/19 0739 07/18/19 1203 07/18/19 1210  BP:  (!) 164/90  (!) 162/85  Pulse:    97  Resp:    (!) 24  Temp:    98.2 F (36.8 C)  TempSrc:    Oral  SpO2:    99%  Weight: 53 kg  49.8 kg   Height:   5\' 4"  (1.626 m)     Intake/Output Summary (Last 24 hours) at 07/18/2019 1422 Last data filed at 07/18/2019 1300 Gross per 24 hour  Intake 740 ml  Output -  Net 740 ml   Filed Weights   07/18/19 0737 07/18/19 1203  Weight: 53 kg 49.8 kg  Examination: Physical Exam:  General: Not in acute distress HEENT: PERRL, EOMI, no scleral icterus, No JVD or bruit Cardiac: S1/S2, RRR, No murmurs, gallops or rubs Pulm: has wheezing Abd: Soft, nondistended, nontender, no rebound pain, no organomegaly, BS present Ext: No edema. 2+DP/PT pulse bilaterally Musculoskeletal: No joint deformities, erythema, or stiffness, ROM full Skin: No rashes.  Neuro: Alert and oriented X3, cranial nerves II-XII grossly intact. Psych: Patient is not psychotic, no suicidal or hemocidal ideation.    Data Reviewed: I have personally reviewed following labs and imaging studies  CBC: Recent Labs  Lab 07/17/19 1813 07/18/19 0511  WBC 6.3 3.7*  HGB 10.9* 10.6*  HCT 35.6* 32.7*  MCV 84.8 81.1  PLT 254 [id number]   Basic Metabolic Panel: Recent Labs  Lab 07/17/19 1813 07/18/19 0511  NA 137 138  K 4.3 3.9  CL 100 103  CO2 27 26  GLUCOSE 96 149*  BUN 15 12  CREATININE 0.70 0.53  CALCIUM 9.3 9.0   GFR: Estimated Creatinine Clearance: 36 mL/min (by C-G formula based on SCr of 0.53 mg/dL). Liver  Function Tests: No results for input(s): AST, ALT, ALKPHOS, BILITOT, PROT, ALBUMIN in the last 168 hours. No results for input(s): LIPASE, AMYLASE in the last 168 hours. No results for input(s): AMMONIA in the last 168 hours. Coagulation Profile: No results for input(s): INR, PROTIME in the last 168 hours. Cardiac Enzymes: No results for input(s): CKTOTAL, CKMB, CKMBINDEX, TROPONINI in the last 168 hours. BNP (last 3 results) No results for input(s): PROBNP in the last 8760 hours. HbA1C: No results for input(s): HGBA1C in the last 72 hours. CBG: No results for input(s): GLUCAP in the last 168 hours. Lipid Profile: No results for input(s): CHOL, HDL, LDLCALC, TRIG, CHOLHDL, LDLDIRECT in the last 72 hours. Thyroid Function Tests: No results for input(s): TSH, T4TOTAL, FREET4, T3FREE, THYROIDAB in the last 72 hours. Anemia Panel: No results for input(s): VITAMINB12, FOLATE, FERRITIN, TIBC, IRON, RETICCTPCT in the last 72 hours. Sepsis Labs: Recent Labs  Lab 07/18/19 0511  PROCALCITON <0.10    Recent Results (from the past 240 hour(s))  SARS CORONAVIRUS 2 (TAT 6-24 HRS) Nasopharyngeal Nasopharyngeal Swab     Status: None   Collection Time: 07/17/19 11:38 PM   Specimen: Nasopharyngeal Swab  Result Value Ref Range Status   SARS Coronavirus 2 NEGATIVE NEGATIVE Final    Comment: (NOTE) SARS-CoV-2 target nucleic acids are NOT DETECTED. The SARS-CoV-2 RNA is generally detectable in upper and lower respiratory specimens during the acute phase of infection. Negative results do not preclude SARS-CoV-2 infection, do not rule out co-infections with other pathogens, and should not be used as the sole basis for treatment or other patient management decisions. Negative results must be combined with clinical observations, patient history, and epidemiological information. The expected result is Negative. Fact Sheet for Patients: SugarRoll.be Fact Sheet for  Healthcare Providers: https://www.woods-mathews.com/ This test is not yet approved or cleared by the [place] FDA and  has been authorized for detection and/or diagnosis of SARS-CoV-2 by FDA under an Emergency Use Authorization (EUA). This EUA will remain  in effect (meaning this test can be used) for the duration of the COVID-19 declaration under Section 56 4(b)(1) of the Act, 21 U.S.C. section 360bbb-3(b)(1), unless the authorization is terminated or revoked sooner. Performed at [facility], [place] [address] [place], [place] [address]   Culture, blood (routine x 2) Call MD if unable to obtain prior to antibiotics being given  Status: None (Preliminary result)   Collection Time: 07/18/19  8:33 AM   Specimen: BLOOD  Result Value Ref Range Status   Specimen Description BLOOD L AC  Final   Special Requests   Final    BOTTLES DRAWN AEROBIC AND ANAEROBIC Blood Culture adequate volume   Culture   Final    NO GROWTH <12 HOURS Performed at [facility], [address], [place], [place] [address]    Report Status PENDING  Incomplete  Culture, blood (routine x 2) Call MD if unable to obtain prior to antibiotics being given     Status: None (Preliminary result)   Collection Time: 07/18/19  8:34 AM   Specimen: BLOOD  Result Value Ref Range Status   Specimen Description BLOOD L ARM  Final   Special Requests   Final    BOTTLES DRAWN AEROBIC AND ANAEROBIC Blood Culture adequate volume   Culture   Final    NO GROWTH <12 HOURS Performed at [facility], [address] [place], [place] [address]    Report Status PENDING  Incomplete     Radiology Studies: Dg Chest Port 1 View  Result Date: 07/17/2019 CLINICAL DATA:  Shortness of breath and wheezing EXAM: PORTABLE CHEST 1 VIEW COMPARISON:  September 17, 2018 FINDINGS: The heart size and mediastinal contours are unchanged. Aortic knob calcifications are seen. There are coarsened interstitial markings  seen at both lung bases as on the prior exam. There is however a new small patchy focus of opacity seen at the periphery of the right lung base. There is hyperinflation of the lung zones. No acute osseous abnormality. IMPRESSION: New small focus of patchy airspace opacity in the periphery of the right lung base which could be due to atelectasis and/or early infectious etiology. Chronic bibasilar coarsened interstitial markings. Electronically Signed   By: Prudencio Pair M.D.   On: 07/17/2019 19:17        Scheduled Meds: . amLODipine  5 mg Oral Daily  . dextromethorphan  30 mg Oral BID   And  . guaiFENesin  600 mg Oral BID  . enoxaparin (LOVENOX) injection  40 mg Subcutaneous Q24H  . ipratropium-albuterol  3 mL Nebulization Q4H  . methylPREDNISolone (SOLU-MEDROL) injection  60 mg Intravenous Daily  . metoprolol tartrate  25 mg Oral BID  . mometasone-formoterol  2 puff Inhalation BID  . polyethylene glycol  17 g Oral Daily  . senna  1 tablet Oral Daily   Continuous Infusions: . clindamycin (CLEOCIN) IV       LOS: 1 day    Time spent: [place], DO [facility] PAGER is on [place]  If 7PM-7AM, please contact night-coverage [email or web address] Password [email or web address] 07/18/2019, 2:22 PM

## 2019-07-18 NOTE — ED Notes (Signed)
Pt is currently in the bathroom - pt was impacted but was helped to have a large bm, clay like consistancy. Pt still feels like she needs to go and remains on the toilet.

## 2019-07-18 NOTE — Evaluation (Signed)
Physical Therapy Evaluation Patient Details Name: Janice Hoffman MRN: [id number] DOB: October 05, 1925 Today's Date: 07/18/2019   History of Present Illness  Janice Hoffman is a 4yoF who comes to [facility] on 11/7 after several days increased SOB and wheezing. PMH: dementia, COPD, hx of intubation in the setting of pneumonia.  Clinical Impression  Pt admitted with above diagnosis. Pt currently with functional limitations due to the deficits listed below (see "PT Problem List"). Upon entry, pt in bed, awake and agreeable to participate. The pt is alert and oriented to self, pleasant but not conversational, and historically a poor historian. I suspect some receptive language processing difficult contributes more than HOH when asking for more detailed or complex information. Supervision to EOB, minA to rise from EOB 2x, minGuard assist to supervision for short household distance AMB prior to requisite rest break 2/2 SOB and DOE. Functional mobility assessment demonstrates increased effort/time requirements, poor tolerance, and need for physical assistance, whereas PLOF is not clear at this time. Pt has historically been much more limited in mobility per prior encounters, however pt has not been admitted in several months, hence updated PLOF in basic mobility is not available at this time. With dementia status and historical difficulty with mobility, pt is certainly SNF appropriate, however family has historically opted to care for patient in home, hence pending no change in family preference, DC to home may prove to be the most cogent solution. Pt not likely to benefit more from high frequency rehab services than 2-3x weekly in HHPT or OPPT settings. Pt will benefit from skilled PT intervention to increase independence and safety with basic mobility in preparation for discharge to the venue listed below.       Follow Up Recommendations Supervision/Assistance - 24 hour;Supervision for mobility/OOB;Other (comment)(Son  has historically refused STR placement; Pt could take PT at home or in community based on family preference.)    Equipment Recommendations  None recommended by PT    Recommendations for Other Services       Precautions / Restrictions Precautions Precautions: Fall      Mobility  Bed Mobility Overal bed mobility: Needs Assistance Bed Mobility: Supine to Sit     Supine to sit: Supervision     General bed mobility comments: heart effort required, but no physical assistance or significantly increased time.  Transfers Overall transfer level: Needs assistance Equipment used: Rolling walker (2 wheeled) Transfers: Sit to/from Stand Sit to Stand: Min assist         General transfer comment: difficulty rising from EOB at standard height; once up able to establsih balance without a few seconds.  Ambulation/Gait Ambulation/Gait assistance: Min guard;Supervision Gait Distance (Feet): 38 Feet Assistive device: Rolling walker (2 wheeled) Gait Pattern/deviations: Step-to pattern     General Gait Details: slow and weak appearing step-to 3-point gait with RW; pt able to perform with safe use of RW and without gross gait instability. No LOB noted. Pt fatigued thereafter with increaesd audible wheezing.  Stairs            Wheelchair Mobility    Modified Rankin (Stroke Patients Only)       Balance Overall balance assessment: Needs assistance Sitting-balance support: No upper extremity supported;Feet supported Sitting balance-Leahy Scale: Good Sitting balance - Comments: able to sit independently at EOB x 5 minutes without difficulty   Standing balance support: Bilateral upper extremity supported;During functional activity Standing balance-Leahy Scale: Fair  Pertinent Vitals/Pain Pain Assessment: No/denies pain    Home Living Family/patient expects to be discharged to:: Private residence Living Arrangements: Children;Other  relatives(son and DIL) Available Help at Discharge: Family Type of Home: House Home Access: Stairs to enter Entrance Stairs-Rails: Right   Home Layout: One level Home Equipment: Environmental consultant - 2 wheels;Cane - single point;Wheelchair - manual      Prior Function Level of Independence: Needs assistance   Gait / Transfers Assistance Needed: limited detail available d/t dementia  ADL's / Homemaking Assistance Needed: limited detail available d/t dementia  Comments: used RW for short trips between rooms     Hand Dominance   Dominant Hand: Right    Extremity/Trunk Assessment   Upper Extremity Assessment Upper Extremity Assessment: Generalized weakness    Lower Extremity Assessment Lower Extremity Assessment: Generalized weakness       Communication   Communication: HOH  Cognition Arousal/Alertness: Awake/alert Behavior During Therapy: WFL for tasks assessed/performed Overall Cognitive Status: Within Functional Limits for tasks assessed                                        General Comments      Exercises     Assessment/Plan    PT Assessment Patient needs continued PT services  PT Problem List Decreased strength;Decreased range of motion;Decreased activity tolerance;Decreased balance;Decreased mobility;Decreased coordination       PT Treatment Interventions DME instruction;Balance training;Gait training;Stair training;Functional mobility training;Therapeutic activities;Therapeutic exercise;Patient/family education    PT Goals (Current goals can be found in the Care Plan section)  Acute Rehab PT Goals PT Goal Formulation: Patient unable to participate in goal setting Time For Goal Achievement: 08/01/19    Frequency Min 2X/week   Barriers to discharge        Co-evaluation               AM-PAC PT "6 Clicks" Mobility  Outcome Measure Help needed turning from your back to your side while in a flat bed without using bedrails?: A Little Help  needed moving from lying on your back to sitting on the side of a flat bed without using bedrails?: A Little Help needed moving to and from a bed to a chair (including a wheelchair)?: A Little Help needed standing up from a chair using your arms (e.g., wheelchair or bedside chair)?: A Little Help needed to walk in hospital room?: A Little Help needed climbing 3-5 steps with a railing? : A Lot 6 Click Score: 17    End of Session Equipment Utilized During Treatment: Gait belt Activity Tolerance: Patient limited by fatigue;No increased pain;Treatment limited secondary to medical complications (Comment)(increased wheezing) Patient left: in chair;with call bell/phone within reach;with chair alarm set Nurse Communication: Mobility status PT Visit Diagnosis: Unsteadiness on feet (R26.81);Difficulty in walking, not elsewhere classified (R26.2)    Time: [id number] PT Time Calculation (min) (ACUTE ONLY): 20 min   Charges:   PT Evaluation $PT Eval Moderate Complexity: 1 Mod          3:17 PM, 07/18/19 Etta Grandchild, PT, DPT Physical Therapist - [facility]  [phone] ([place])    Kyria Bumgardner C 07/18/2019, 3:10 PM

## 2019-07-18 NOTE — ED Notes (Signed)
Pt transported to room 254 

## 2019-07-18 NOTE — ED Notes (Signed)
Pt transported to room 252.

## 2019-07-18 NOTE — Plan of Care (Signed)
Newly admitted to in-patient unit.  Oriented to safety measures in room. Low bed, brakes locked, phone within reach.  Son at bedside.   Problem: Education: Goal: Knowledge of General Education information will improve Description: Including pain rating scale, medication(s)/side effects and non-pharmacologic comfort measures Outcome: Not Progressing   Problem: Health Behavior/Discharge Planning: Goal: Ability to manage health-related needs will improve Outcome: Not Progressing   Problem: Clinical Measurements: Goal: Ability to maintain clinical measurements within normal limits will improve Outcome: Not Progressing Goal: Will remain free from infection Outcome: Not Progressing Goal: Diagnostic test results will improve Outcome: Not Progressing Goal: Respiratory complications will improve Outcome: Not Progressing Goal: Cardiovascular complication will be avoided Outcome: Not Progressing   Problem: Activity: Goal: Risk for activity intolerance will decrease Outcome: Not Progressing   Problem: Nutrition: Goal: Adequate nutrition will be maintained Outcome: Not Progressing   Problem: Coping: Goal: Level of anxiety will decrease Outcome: Not Progressing   Problem: Elimination: Goal: Will not experience complications related to bowel motility Outcome: Not Progressing Goal: Will not experience complications related to urinary retention Outcome: Not Progressing   Problem: Pain Managment: Goal: General experience of comfort will improve Outcome: Not Progressing   Problem: Safety: Goal: Ability to remain free from injury will improve Outcome: Not Progressing   Problem: Skin Integrity: Goal: Risk for impaired skin integrity will decrease Outcome: Not Progressing   Problem: Activity: Goal: Ability to tolerate increased activity will improve Outcome: Not Progressing   Problem: Clinical Measurements: Goal: Ability to maintain a body temperature in the normal range will  improve Outcome: Not Progressing   Problem: Respiratory: Goal: Ability to maintain adequate ventilation will improve Outcome: Not Progressing Goal: Ability to maintain a clear airway will improve Outcome: Not Progressing

## 2019-07-18 NOTE — Progress Notes (Signed)
Pt complaining of itching. Multiple abrasion on arms.  Right arm is red and hot to touch. MD notified.

## 2019-07-18 NOTE — ED Notes (Signed)
ED TO INPATIENT HANDOFF REPORT  ED Nurse Name and Phone #: Pietrina Jagodzinski [phone]  S Name/Age/Gender Janice Hoffman 85 y.o. female Room/Bed: ED32A/ED32A  Code Status   Code Status: DNR  Home/SNF/Other Home Patient oriented to: self, place and time Is this baseline? Yes   Triage Complete: Triage complete  Chief Complaint SOB; Fever  Triage Note Pt arrived via POV with reports of shortness of breath, low grade fever since Thursday. Worse SOB today per son.  Son believes pt may have aspirated, pt has hx of aspiration PNA in Jan 2020, pt also has hx of COPD.  Pt had neb tx x 3-4 today but continues to wheeze.   Pt has caregiver in the home during the day.   Allergies Allergies  Allergen Reactions  . Aspirin Shortness Of Breath  . Nsaids Shortness Of Breath and Anaphylaxis    Throat swelling, shortness of breath  . Sulfa Antibiotics Other (See Comments)    Stomach pain Stomach pain   . Codeine Other (See Comments)    Short of breath   . Ciprofloxacin Hives  . Penicillins     Has patient had a PCN reaction causing immediate rash, facial/tongue/throat swelling, SOB or lightheadedness with hypotension: Unknown Has patient had a PCN reaction causing severe rash involving mucus membranes or skin necrosis: Unknown Has patient had a PCN reaction that required hospitalization: Unknown Has patient had a PCN reaction occurring within the last 10 years: Unknown If all of the above answers are "NO", then may proceed with Cephalosporin use.  Other reaction(s): Angioedema (ALLERGY/intolerance) Hives, swelling  . Phenol     Other reaction(s): Angioedema (ALLERGY/intolerance), Other (See Comments) Spasms, nerve symptoms Spasms, nerve symptoms  Other reaction(s): Angioedema (ALLERGY/intolerance) Spasms, nerve symptoms  . Promethazine Hcl     Level of Care/Admitting Diagnosis ED Disposition    ED Disposition Condition [facility] Area: [place]  [[id number]]  Level of Care: Telemetry [5]  Covid Evaluation: Asymptomatic Screening Protocol (No Symptoms)  Diagnosis: Shortness of breath [786.05.ICD-9-CM]  Admitting Physician: Orene Desanctis [id number]  Attending Physician: Orene Desanctis [id number]  Estimated length of stay: past midnight tomorrow  Certification:: I certify this patient will need inpatient services for at least 2 midnights  PT Class (Do Not Modify): Inpatient [101]  PT Acc Code (Do Not Modify): Private [1]       B Medical/Surgery History Past Medical History:  Diagnosis Date  . Asthma   . COPD (chronic obstructive pulmonary disease) ([place])   . Herpes zoster   . Hypertension   . Neuralgia    Past Surgical History:  Procedure Laterality Date  . LEG SURGERY       A IV Location/Drains/Wounds Patient Lines/Drains/Airways Status   Active Line/Drains/Airways    Name:   Placement date:   Placement time:   Site:   Days:   Peripheral IV 07/17/19 Left Antecubital   07/17/19    1813    Antecubital   1          Intake/Output Last 24 hours  Intake/Output Summary (Last 24 hours) at 07/18/2019 1128 Last data filed at 07/18/2019 1053 Gross per 24 hour  Intake 500 ml  Output -  Net 500 ml    Labs/Imaging Results for orders placed or performed during the hospital encounter of 07/17/19 (from the past 48 hour(s))  Basic metabolic panel     Status: None   Collection Time: 07/17/19  6:13 PM  Result Value  Ref Range   Sodium 137 135 - 145 mmol/L   Potassium 4.3 3.5 - 5.1 mmol/L   Chloride 100 98 - 111 mmol/L   CO2 27 22 - 32 mmol/L   Glucose, Bld 96 70 - 99 mg/dL   BUN 15 8 - 23 mg/dL   Creatinine, Ser 0.70 0.44 - 1.00 mg/dL   Calcium 9.3 8.9 - 10.3 mg/dL   GFR calc non Af Amer >60 >60 mL/min   GFR calc Af Amer >60 >60 mL/min   Anion gap 10 5 - 15    Comment: Performed at [facility], [address] [place], [place] [address]  CBC     Status: Abnormal   Collection Time: 07/17/19  6:13 PM  Result Value  Ref Range   WBC 6.3 4.0 - 10.5 K/uL   RBC 4.20 3.87 - 5.11 MIL/uL   Hemoglobin 10.9 (L) 12.0 - 15.0 g/dL   HCT 35.6 (L) 36.0 - 46.0 %   MCV 84.8 80.0 - 100.0 fL   MCH 26.0 26.0 - 34.0 pg   MCHC 30.6 30.0 - 36.0 g/dL   RDW 13.9 11.5 - 15.5 %   Platelets 254 150 - 400 K/uL   nRBC 0.0 0.0 - 0.2 %    Comment: Performed at [facility], [address] [place], [place] [address]  SARS CORONAVIRUS 2 (TAT 6-24 HRS) Nasopharyngeal Nasopharyngeal Swab     Status: None   Collection Time: 07/17/19 11:38 PM   Specimen: Nasopharyngeal Swab  Result Value Ref Range   SARS Coronavirus 2 NEGATIVE NEGATIVE    Comment: (NOTE) SARS-CoV-2 target nucleic acids are NOT DETECTED. The SARS-CoV-2 RNA is generally detectable in upper and lower respiratory specimens during the acute phase of infection. Negative results do not preclude SARS-CoV-2 infection, do not rule out co-infections with other pathogens, and should not be used as the sole basis for treatment or other patient management decisions. Negative results must be combined with clinical observations, patient history, and epidemiological information. The expected result is Negative. Fact Sheet for Patients: SugarRoll.be Fact Sheet for Healthcare Providers: https://www.woods-mathews.com/ This test is not yet approved or cleared by the [place] FDA and  has been authorized for detection and/or diagnosis of SARS-CoV-2 by FDA under an Emergency Use Authorization (EUA). This EUA will remain  in effect (meaning this test can be used) for the duration of the COVID-19 declaration under Section 56 4(b)(1) of the Act, 21 U.S.C. section 360bbb-3(b)(1), unless the authorization is terminated or revoked sooner. Performed at [facility], [place] [address] [place], [place] [id number]   Basic metabolic panel     Status: Abnormal   Collection Time: 07/18/19  5:11 AM  Result Value Ref Range   Sodium  138 135 - 145 mmol/L   Potassium 3.9 3.5 - 5.1 mmol/L   Chloride 103 98 - 111 mmol/L   CO2 26 22 - 32 mmol/L   Glucose, Bld 149 (H) 70 - 99 mg/dL   BUN 12 8 - 23 mg/dL   Creatinine, Ser 0.53 0.44 - 1.00 mg/dL   Calcium 9.0 8.9 - 10.3 mg/dL   GFR calc non Af Amer >60 >60 mL/min   GFR calc Af Amer >60 >60 mL/min   Anion gap 9 5 - 15    Comment: Performed at [facility], [address], [place],  [address]  CBC     Status: Abnormal   Collection Time: 07/18/19  5:11 AM  Result Value Ref  Range   WBC 3.7 (L) 4.0 - 10.5 K/uL   RBC 4.03 3.87 - 5.11 MIL/uL   Hemoglobin 10.6 (L) 12.0 - 15.0 g/dL   HCT 32.7 (L) 36.0 - 46.0 %   MCV 81.1 80.0 - 100.0 fL   MCH 26.3 26.0 - 34.0 pg   MCHC 32.4 30.0 - 36.0 g/dL   RDW 14.0 11.5 - 15.5 %   Platelets 213 150 - 400 K/uL   nRBC 0.0 0.0 - 0.2 %    Comment: Performed at [facility], [address] [place], [place] [address]  Procalcitonin - Baseline     Status: None   Collection Time: 07/18/19  5:11 AM  Result Value Ref Range   Procalcitonin <0.10 ng/mL    Comment:        Interpretation: PCT (Procalcitonin) <= 0.5 ng/mL: Systemic infection (sepsis) is not likely. Local bacterial infection is possible. (NOTE)       Sepsis PCT Algorithm           Lower Respiratory Tract                                      Infection PCT Algorithm    ----------------------------     ----------------------------         PCT < 0.25 ng/mL                PCT < 0.10 ng/mL         Strongly encourage             Strongly discourage   discontinuation of antibiotics    initiation of antibiotics    ----------------------------     -----------------------------       PCT 0.25 - 0.50 ng/mL            PCT 0.10 - 0.25 ng/mL               OR       >80% decrease in PCT            Discourage initiation of                                            antibiotics      Encourage discontinuation           of antibiotics    ----------------------------      -----------------------------         PCT >= 0.50 ng/mL              PCT 0.26 - 0.50 ng/mL               AND        <80% decrease in PCT             Encourage initiation of                                             antibiotics       Encourage continuation           of antibiotics    ----------------------------     -----------------------------        PCT >= 0.50 ng/mL  PCT > 0.50 ng/mL               AND         increase in PCT                  Strongly encourage                                      initiation of antibiotics    Strongly encourage escalation           of antibiotics                                     -----------------------------                                           PCT <= 0.25 ng/mL                                                 OR                                        > 80% decrease in PCT                                     Discontinue / Do not initiate                                             antibiotics Performed at [facility], [address], [place], [place] [address]   Culture, blood (routine x 2) Call MD if unable to obtain prior to antibiotics being given     Status: None (Preliminary result)   Collection Time: 07/18/19  8:33 AM   Specimen: BLOOD  Result Value Ref Range   Specimen Description BLOOD L AC    Special Requests      BOTTLES DRAWN AEROBIC AND ANAEROBIC Blood Culture adequate volume   Culture      NO GROWTH <12 HOURS Performed at [facility], [address] [place], [place] [address]    Report Status PENDING   Culture, blood (routine x 2) Call MD if unable to obtain prior to antibiotics being given     Status: None (Preliminary result)   Collection Time: 07/18/19  8:34 AM   Specimen: BLOOD  Result Value Ref Range   Specimen Description BLOOD L ARM    Special Requests      BOTTLES DRAWN AEROBIC AND ANAEROBIC Blood Culture adequate volume   Culture      NO GROWTH <12 HOURS Performed at  [facility], [address], [place], [place] [address]    Report Status PENDING    Dg Chest Port 1 View  Result Date: 07/17/2019 CLINICAL DATA:  Shortness of breath and wheezing  EXAM: PORTABLE CHEST 1 VIEW COMPARISON:  September 17, 2018 FINDINGS: The heart size and mediastinal contours are unchanged. Aortic knob calcifications are seen. There are coarsened interstitial markings seen at both lung bases as on the prior exam. There is however a new small patchy focus of opacity seen at the periphery of the right lung base. There is hyperinflation of the lung zones. No acute osseous abnormality. IMPRESSION: New small focus of patchy airspace opacity in the periphery of the right lung base which could be due to atelectasis and/or early infectious etiology. Chronic bibasilar coarsened interstitial markings. Electronically Signed   By: Prudencio Pair M.D.   On: 07/17/2019 19:17    Pending Labs Unresulted Labs (From admission, onward)    Start     Ordered   07/18/19 0714  Legionella Pneumophila Serogp 1 Ur Ag  Once,   STAT     07/18/19 0714   07/18/19 0714  Strep pneumoniae urinary antigen  Once,   STAT     07/18/19 0714   07/18/19 0714  Culture, sputum-assessment  Once,   STAT     07/18/19 0714          Vitals/Pain Today's Vitals   07/18/19 0700 07/18/19 0723 07/18/19 0737 07/18/19 0739  BP:  (!) 164/90  (!) 164/90  Pulse:  98    Resp:  (!) 22    Temp:  98.2 F (36.8 C)    TempSrc:  Oral    SpO2:  97%    Weight:   53 kg   Height: 5' 4.02" (1.626 m)       Isolation Precautions No active isolations  Medications Medications  enoxaparin (LOVENOX) injection 40 mg (40 mg Subcutaneous Given 07/18/19 0830)  metoprolol tartrate (LOPRESSOR) tablet 25 mg (has no administration in time range)  mometasone-formoterol (DULERA) 200-5 MCG/ACT inhaler 2 puff (2 puffs Inhalation Given 07/18/19 0828)  methylPREDNISolone sodium succinate (SOLU-MEDROL) 125 mg/2 mL injection 60 mg (has no  administration in time range)  polyethylene glycol (MIRALAX / GLYCOLAX) packet 17 g (has no administration in time range)  labetalol (NORMODYNE) injection 10 mg (has no administration in time range)  amLODipine (NORVASC) tablet 5 mg (5 mg Oral Given 07/18/19 0739)  dextromethorphan (DELSYM) 30 MG/5ML liquid 30 mg (has no administration in time range)    And  guaiFENesin (MUCINEX) 12 hr tablet 600 mg (has no administration in time range)  senna (SENOKOT) tablet 8.6 mg (has no administration in time range)  methylPREDNISolone sodium succinate (SOLU-MEDROL) 125 mg/2 mL injection 125 mg (125 mg Intravenous Given 07/17/19 2106)  azithromycin (ZITHROMAX) 500 mg in sodium chloride 0.9 % 250 mL IVPB (0 mg Intravenous Stopped 07/17/19 2210)  cefTRIAXone (ROCEPHIN) 1 g in sodium chloride 0.9 % 100 mL IVPB (0 g Intravenous Stopped 07/18/19 0017)  labetalol (NORMODYNE) injection 5 mg (5 mg Intravenous Given 07/18/19 0021)  sodium chloride 0.9 % bolus 500 mL (0 mLs Intravenous Stopped 07/18/19 1053)    Mobility walks with person assist High fall risk   Focused Assessments Pulmonary Assessment Handoff:  Lung sounds: Bilateral Breath Sounds: Expiratory wheezes L Breath Sounds: Expiratory wheezes R Breath Sounds: Expiratory wheezes O2 Device: Room Air        R Recommendations: See Admitting Provider Note  Report given to:   Additional Notes:

## 2019-07-18 NOTE — ED Notes (Signed)
Patient given coffee per request. 

## 2019-07-18 NOTE — ED Notes (Signed)
Patient moved to RM 32.  Patient is resting quietly at this time, no acute distress noted.

## 2019-07-19 LAB — BASIC METABOLIC PANEL
Anion gap: 9 (ref 5–15)
BUN: 13 mg/dL (ref 8–23)
CO2: 26 mmol/L (ref 22–32)
Calcium: 9.1 mg/dL (ref 8.9–10.3)
Chloride: 103 mmol/L (ref 98–111)
Creatinine, Ser: 0.5 mg/dL (ref 0.44–1.00)
GFR calc Af Amer: >60 mL/min (ref >60)
GFR calc non Af Amer: >60 mL/min (ref >60)
Glucose, Bld: 88 mg/dL (ref 70–99)
Potassium: 3.7 mmol/L (ref 3.5–5.1)
Sodium: 138 mmol/L (ref 135–145)

## 2019-07-19 LAB — CBC
HCT: 32.5 % — ABNORMAL LOW (ref 36.0–46.0)
Hemoglobin: 10.6 g/dL — ABNORMAL LOW (ref 12.0–15.0)
MCH: 26.3 pg (ref 26.0–34.0)
MCHC: 32.6 g/dL (ref 30.0–36.0)
MCV: 80.6 fL (ref 80.0–100.0)
Platelets: 267 10*3/uL (ref 150–400)
RBC: 4.03 MIL/uL (ref 3.87–5.11)
RDW: 14.2 % (ref 11.5–15.5)
WBC: 9.4 10*3/uL (ref 4.0–10.5)
nRBC: 0 % (ref 0.0–0.2)

## 2019-07-19 LAB — MAGNESIUM: Magnesium: 1.9 mg/dL (ref 1.7–2.4)

## 2019-07-19 MED ORDER — AMLODIPINE BESYLATE 10 MG PO TABS: 10.0000 mg | ORAL_TABLET | Freq: Every day | ORAL | 1 refills | Status: DC

## 2019-07-19 MED ORDER — CLINDAMYCIN PHOSPHATE 600 MG/50ML IV SOLN: 600.0000 mg | Freq: Three times a day (TID) | INTRAVENOUS | 0 refills | Status: DC

## 2019-07-19 MED ORDER — IPRATROPIUM-ALBUTEROL 0.5-2.5 (3) MG/3ML IN SOLN: 3.0000 mL | Freq: Four times a day (QID) | RESPIRATORY_TRACT | Status: DC

## 2019-07-19 MED ORDER — AMLODIPINE BESYLATE 10 MG PO TABS
10.0000 mg | ORAL_TABLET | Freq: Every day | ORAL | Status: DC
  Administered 2019-07-19: 10 mg via ORAL
  Filled 2019-07-19: qty 1

## 2019-07-19 MED ORDER — GUAIFENESIN ER 600 MG PO TB12: 600.0000 mg | ORAL_TABLET | Freq: Two times a day (BID) | ORAL | 1 refills | Status: DC

## 2019-07-19 MED ORDER — CLINDAMYCIN HCL 300 MG PO CAPS: 300.0000 mg | ORAL_CAPSULE | Freq: Three times a day (TID) | ORAL | 0 refills | Status: AC

## 2019-07-19 MED ORDER — PREDNISONE 10 MG PO TABS: 10.0000 mg | ORAL_TABLET | Freq: Every day | ORAL | 0 refills | Status: DC

## 2019-07-19 NOTE — Progress Notes (Signed)
Discharge instructions gone over with son Anderson Dannunzio. Verbalized understanding without any questions or concerns. IV taken out and tele monitor off.

## 2019-07-19 NOTE — Evaluation (Signed)
Occupational Therapy Evaluation Patient Details Name: Janice Hoffman MRN: [id number] DOB: 1926-04-28 Today's Date: 07/19/2019    History of Present Illness Cecil August is a 70yoF who comes to [facility] on 11/7 after several days increased SOB and wheezing. PMH: dementia, COPD, hx of intubation in the setting of pneumonia.   Clinical Impression   Pt seen for OT evaluation this date. No family present to verify information provided by pt. Pt alert and oriented to self, hospital, and month but only generally to situation and believed it to be 2022. Pt lives with her son and daughter in law. Pt able to perform bed mobility with supervision and increased effort/time, Min A for transfers from bed and toilet, and CGA for short ambulation w/ RW to/from bed/bathroom. Pt requires CGA to Min A for LB ADL for safety. Pt reporting mild SOB after ambulating to bathroom, instructed in pursed lip breathing. Unable to get accurate reading for O2 with pulse ox 2/2 her fingers being so cold. RN in room with dynamap to further assess. With 2L O2 applied via nasal canula, after considerable time, O2 sats on 2L 97%, HR 73, BP 158/97. Pt would benefit from skilled OT to address noted impairments and functional limitations (see below for any additional details) in order to maximize safety and independence while minimizing falls risk and caregiver burden.  Upon hospital discharge, recommend pt discharge to home with family 24/7 including any OOB activity and HHOT services. Given underlying cognitive deficits, suspect pt would perform better within familiar home environment.     Follow Up Recommendations  Supervision/Assistance - 24 hour;Home health OT    Equipment Recommendations  3 in 1 bedside commode    Recommendations for Other Services       Precautions / Restrictions Precautions Precautions: Fall Restrictions Weight Bearing Restrictions: No      Mobility Bed Mobility Overal bed mobility: Needs  Assistance Bed Mobility: Supine to Sit     Supine to sit: Supervision        Transfers Overall transfer level: Needs assistance Equipment used: Rolling walker (2 wheeled) Transfers: Sit to/from Stand Sit to Stand: Min assist              Balance Overall balance assessment: Needs assistance Sitting-balance support: No upper extremity supported;Feet supported Sitting balance-Leahy Scale: Good     Standing balance support: Bilateral upper extremity supported;During functional activity Standing balance-Leahy Scale: Fair                             ADL either performed or assessed with clinical judgement   ADL Overall ADL's : Needs assistance/impaired         Upper Body Bathing: Sitting;Set up;Supervision/ safety   Lower Body Bathing: Sit to/from stand;Min guard;Minimal assistance   Upper Body Dressing : Sitting;Supervision/safety;Set up   Lower Body Dressing: Sit to/from stand;Minimal assistance;Min guard   Toilet Transfer: Regular Toilet;RW;Minimal assistance   Toileting- Clothing Manipulation and Hygiene: Supervision/safety;Sitting/lateral lean               Vision Baseline Vision/History: No visual deficits Patient Visual Report: No change from baseline Vision Assessment?: No apparent visual deficits     Perception     Praxis      Pertinent Vitals/Pain Pain Assessment: No/denies pain     Hand Dominance Right   Extremity/Trunk Assessment Upper Extremity Assessment Upper Extremity Assessment: Generalized weakness   Lower Extremity Assessment Lower Extremity Assessment: Generalized weakness  Communication Communication Communication: HOH   Cognition Arousal/Alertness: Awake/alert Behavior During Therapy: WFL for tasks assessed/performed Overall Cognitive Status: History of cognitive impairments - at baseline                                 General Comments: Pt alert and oriented to self, place, month  (difficulty with year and specifically why she is here), follows commands well   General Comments  Pt reporting slightly SOB after ambulating to bathroom, unable to get good reading via pulse ox 2/2 very cold hand; RN came with dinamap, 2L O2 applied via nasal canula, after considerable time, O2 sats on 2L 97%, HR 73, BP 158/97    Exercises Other Exercises Other Exercises: Pt instructed in pursed lip breathing for breath recovery after exertion   Shoulder Instructions      Home Living Family/patient expects to be discharged to:: Private residence Living Arrangements: Children;Other relatives(son and DIL) Available Help at Discharge: Family Type of Home: House Home Access: Stairs to enter   Entrance Stairs-Rails: Right Home Layout: One level     Bathroom Shower/Tub: Occupational psychologist: Standard     Home Equipment: Environmental consultant - 2 wheels;Cane - single point;Wheelchair - manual          Prior Functioning/Environment Level of Independence: Needs assistance  Gait / Transfers Assistance Needed: used RW for short trips between rooms ADL's / Homemaking Assistance Needed: per pt (not verified) she was generally independent with ADL, says she manages her own medication but unable to verify this            OT Problem List: Decreased strength;Cardiopulmonary status limiting activity;Decreased activity tolerance;Impaired balance (sitting and/or standing);Decreased knowledge of use of DME or AE      OT Treatment/Interventions: Self-care/ADL training;Therapeutic exercise;Therapeutic activities;DME and/or AE instruction;Patient/family education;Balance training    OT Goals(Current goals can be found in the care plan section) Acute Rehab OT Goals Patient Stated Goal: go home OT Goal Formulation: With patient Time For Goal Achievement: 08/02/19 Potential to Achieve Goals: Good ADL Goals Pt Will Perform Lower Body Dressing: with supervision;sit to/from stand Pt Will  Transfer to Toilet: with supervision;ambulating(BSC over toilet, LRAD For amb)  OT Frequency: Min 1X/week   Barriers to D/C:            Co-evaluation              AM-PAC OT "6 Clicks" Daily Activity     Outcome Measure Help from another person eating meals?: None Help from another person taking care of personal grooming?: None Help from another person toileting, which includes using toliet, bedpan, or urinal?: A Little Help from another person bathing (including washing, rinsing, drying)?: A Little Help from another person to put on and taking off regular upper body clothing?: None Help from another person to put on and taking off regular lower body clothing?: A Little 6 Click Score: 21   End of Session Equipment Utilized During Treatment: Gait belt;Rolling walker  Activity Tolerance: Patient tolerated treatment well Patient left: in bed;with call bell/phone within reach;with bed alarm set;with nursing/sitter in room  OT Visit Diagnosis: Other abnormalities of gait and mobility (R26.89);Muscle weakness (generalized) (M62.81)                Time: [id number] OT Time Calculation (min): 33 min Charges:  OT General Charges $OT Visit: 1 Visit OT Evaluation $OT Eval Low Complexity: 1 Low  OT Treatments $Self Care/Home Management : 8-22 mins  Jeni Salles, MPH, MS, OTR/L ascom [phone] 07/19/19, 10:23 AM

## 2019-07-19 NOTE — Progress Notes (Addendum)
Patient pending swallow eval, po meds on hold for right now.   Update 1450: Patient ambulated in room on room air and oxygen saturation remained above 93%.    Update 1700: Son [name] notified of patient's discharge order.

## 2019-07-19 NOTE — Discharge Instructions (Signed)
You were cared for by a hospitalist during your hospital stay. If you have any questions about your discharge medications or the care you received while you were in the hospital after you are discharged, you can call the unit and ask to speak with the hospitalist on call if the hospitalist that took care of you is not available. Once you are discharged, your primary care physician will handle any further medical issues. Please note that NO REFILLS for any discharge medications will be authorized once you are discharged, as it is imperative that you return to your primary care physician (or establish a relationship with a primary care physician if you do not have one) for your aftercare needs so that they can reassess your need for medications and monitor your lab values.  Follow up with PCP. Take all medications as prescribed. If symptoms change or worsen please return to the ED for evaluation   Diet recommendation: [place] w/ Minced meats/gravies added; Thin liquids VIA CUP ONLY - NO STRAWS. General aspiration precautions; tray setup at meals. Pt sitting in a chair for meals. NEVER lie down after meals for 1 hour. Pills WHOLE in puree/Applesauce.

## 2019-07-19 NOTE — Evaluation (Addendum)
Clinical/Bedside Swallow Evaluation Patient Details  Name: Janice Hoffman MRN: [id number] Date of Birth: Jan 12, 1926  Today's Date: 07/19/2019 Time: SLP Start Time (ACUTE ONLY): 0539 SLP Stop Time (ACUTE ONLY): 1335 SLP Time Calculation (min) (ACUTE ONLY): 60 min  Past Medical History:  Past Medical History:  Diagnosis Date  . Asthma   . COPD (chronic obstructive pulmonary disease) ([place])   . Herpes zoster   . Hypertension   . Neuralgia    Past Surgical History:  Past Surgical History:  Procedure Laterality Date  . LEG SURGERY     HPI:  Pt is a 85 y.o. female  with past medical history of mild Dementia, COPD, HTN, neuralgia, and previous admit in 09/2018 with AMS and respiratory distress d/t potential aspiration of foods. Patient was intubated - Food particles were suctioned from ET tube after intubation then. Patient also diagnosed with Esophageal Dysmotility in past. Per MD following in ICU, patient's CT scans as far back as 2017 revealed evidence of esophageal dilation. Of note, a feeding tube insertion was unsuccessful d/t "coiling" in the distal Esophagus.  Currently this admission, Son reports that about 3 days ago, patient began to have increasing shortness of breath and wheezing. He was giving her breathing treatments about every 3 hours which did help her to cough and produce more sputum but did not relieve her shortness of breath. He also notes that she had an elevate temperature of 100.1. She also has been constipated.  Has not had a bowel movement for 2 days and the last bowel movement produced very little minimal stool. Noted CXR: coarsened interstitial markings seen at both lung bases as on the prior exam w/ new small patchy focus of opacity seen at the periphery of the right lung base.    Assessment / Plan / Recommendation Clinical Impression  Pt appears to present w/ grossly adequate oropharyngeal phase swallowing function w/ No immediate, overt clinical s/s of aspiration w/  po trials at lunch meal today. Pt sat EOB feeding self w/ setup assist. She appeared weak w/ min Kyphotic position and took her Time feeding self (this increases a pt's safety w/ oral intake). Pt does have a baseline of Esophageal phase dysmotility per chart notes(last admission and prior w/ Esophageal phase dilitations per notes). ANY Esophageal phase dysmotility increases risk for aspiration of Regurgitated REFLUX material during/post meals - especially IF lying down immediately post meals. Pt also has a baseline of Dementia which can impact oropharyngeal phase swallowing; timing of. During this assessment, pt sat EOB feeding self. Noted a baseline congested coughin No immediate, overt clinical s/s of aspiration noted w/ po trials of thin liquids VIA CUP, purees and softened solids - no decline in respiratory status during/post trials; no immediate coughing or wet vocal quality. Oral phase was min slower during the thorough mastication of the increased textured foods - pt did best w/ the softened foods in Soup(easier mastication and oral clearing d/t moisture). No signfiicant oral residue left; pt used lingual sweeping to aid clearing. Pt appeared to BEST eat the PUREE food on the tray including applesauce; then some ice creams(2). These last foods provided much easier mastication/oral clearing. Timely management noted w/ the purees, thin liquids VIA CUP. OM exam appeared grossly [facility] w/ no unilateral weakness assessed. Pt fed self w/ setup support.  Recommend a mech soft diet w/ addedPpurees and SOUPS w/ the meals for easier oral intake in general - this may provide easier Esophageal phase motility as well and lessen  risk for Regurgitation/Reflux. Recommend thin liquids VIA CUP - NO Straws and to monitor for toleration of thin liquids following general aspiration precautions in light of advanced age and baseline Dementia w/ Esophageal phase dysmotility. Recommmend Pills in Puree - whole or crushed as needed for  ease/safety. Tray setup at meals. Recommend discussion w/ Palliative Care for [place] re: swallowing, dysphagia. Handouts on diet and precautions provided for d/c home.  SLP Visit Diagnosis: Dysphagia, oropharyngeal phase (R13.12)(baseline Dementia; Esophageal phase deficits)    Aspiration Risk  Mild aspiration risk;Risk for inadequate nutrition/hydration    Diet Recommendation  Dysphagia level 3 (mech soft foods w/ meats MINCED w/ gravy added); thin liquids. More Purees and Soups added in meals for ease including ease of Esophageal clearing; aspiration and REFLUX Precautions. Monitoring at meals for support.   Medication Administration: Whole meds with puree(or crushed as needed)    Other  Recommendations Recommended Consults: Consider GI evaluation;Consider esophageal assessment(for education; Dietician and Palliative care f/u) Oral Care Recommendations: Oral care BID;Staff/trained caregiver to provide oral care   Follow up Recommendations None(TBD)      Frequency and Duration min 2x/week  1 week       Prognosis Prognosis for Safe Diet Advancement: Fair Barriers to Reach Goals: Cognitive deficits;Time post onset;Severity of deficits(Esophageal phase deficits)      Swallow Study   General Date of Onset: 07/17/19 HPI: Pt is a 85 y.o. female  with past medical history of mild Dementia, COPD, HTN, neuralgia, and previous admit in 09/2018 with AMS and respiratory distress d/t potential aspiration of foods. Patient was intubated - Food particles were suctioned from ET tube after intubation then. Patient also diagnosed with Esophageal Dysmotility in past. Per MD following in ICU, patient's CT scans as far back as 2017 revealed evidence of esophageal dilation. Of note, a feeding tube insertion was unsuccessful d/t "coiling" in the distal Esophagus.  Currently this admission, Son reports that about 3 days ago, patient began to have increasing shortness of breath and wheezing. He was giving her  breathing treatments about every 3 hours which did help her to cough and produce more sputum but did not relieve her shortness of breath. He also notes that she had an elevate temperature of 100.1. She also has been constipated.  Has not had a bowel movement for 2 days and the last bowel movement produced very little minimal stool. Noted CXR: coarsened interstitial markings seen at both lung bases as on the prior exam w/ new small patchy focus of opacity seen at the periphery of the right lung base.  Type of Study: Bedside Swallow Evaluation Previous Swallow Assessment: Jan 2020 - dysphagia diet rec'd then d/t illness Diet Prior to this Study: Thin liquids(soft foods at home, per pt) Temperature Spikes Noted: No(wbc 9.4) Respiratory Status: Room air History of Recent Intubation: No Behavior/Cognition: Alert;Cooperative;Pleasant mood;Requires cueing(min; min HOH) Oral Cavity Assessment: Within Functional Limits Oral Care Completed by SLP: Recent completion by staff Oral Cavity - Dentition: Dentures, top;Dentures, bottom Vision: Functional for self-feeding Self-Feeding Abilities: Able to feed self;Needs assist;Needs set up(sitting EOB) Patient Positioning: Upright in bed(EOB) Baseline Vocal Quality: Low vocal intensity(adequate) Volitional Cough: Strong;Congested(min) Volitional Swallow: Able to elicit    Oral/Motor/Sensory Function Overall Oral Motor/Sensory Function: Within functional limits   Ice Chips Ice chips: Not tested   Thin Liquid Thin Liquid: Within functional limits Presentation: Cup;Self Fed(NO Straw; ~3ozs of tea w/ meal) Other Comments: also ice cream post meal w/ SLP    Nectar Thick Nectar  Thick Liquid: Not tested   Honey Thick Honey Thick Liquid: Not tested   Puree Puree: Within functional limits Presentation: Self Fed;Spoon(~6-7 ozs total)   Solid     Solid: Impaired Presentation: Spoon;Self Fed(meatloaf and mac/chez - 8 trials) Oral Phase Impairments: Impaired  mastication(min) Oral Phase Functional Implications: Impaired mastication(min) Pharyngeal Phase Impairments: (none noted) Other Comments: pt did best w/ soft cut foods in soup w/ meal       Orinda Kenner, MS, CCC-SLP Sary Bogie 07/19/2019,4:20 PM

## 2019-07-19 NOTE — Plan of Care (Signed)

## 2019-07-19 NOTE — Discharge Summary (Signed)
Physician Discharge Summary  Janice Hoffman Janice Hoffman DOB: [birth year] DOA: 07/17/2019  PCP: Baxter Hire, MD  Admit date: 07/17/2019 Discharge date: 07/19/2019  Recommendations for Outpatient Follow-up:  1. Follow up with PCP in 1 weeks 2. Please obtain BMP/CBC in one week 3. Please follow up on the following pending results:  Home Health: none Equipment/Devices: 3 in 1 commode  Discharge Condition: stable CODE STATUS: DNR  Diet recommendation:  Mech Soft diet w/ Minced meats/gravies added; Thin liquids VIA CUP ONLY - NO STRAWS. General aspiration precautions; tray setup at meals. Pt sitting in a chair for meals. NEVER lie down after meals for 1 hour. Pills WHOLE in puree/Applesauce.  Brief/Interim Summary (HPI)  Janice Hoffman is a 85 y.o. female with medical history significant of dementia, COPD, hx of intubation in the setting of pneumonia who presents with shortness of breath and wheezing.  Son provides history over the phone given patient's dementia. He reports that about 3 days ago patient began to have increasing shortness of breath and wheezing.  He was giving her breathing treatments about every 3 hours which did help her to cough and produce more sputum but did not relieve her shortness of breath.  He also notes that she had an elevate temperature of 100.1.  She also has been constipated.  Has not had a bowel movement for 2 days and the last bowel movement produced very little minimal stool.  ED Course: Patient was afebrile but tachycardic up to 100 and was hypotensive up to 200s over 102.  CBC showed no leukocytosis but hemoglobin of 10.9.  BMP unremarkable. Chest x-ray showed new small focus of patchy airspace opacity in the periphery of the right lung base which could be atelectasis versus infectious etiology.   Discharge Diagnoses and Hospital Course:   Principal Problem:   Acute on chronic respiratory failure with hypoxemia (HCC) Active Problems:   COPD with  acute exacerbation ([place])   Essential hypertension   CAP (community acquired pneumonia)   Dementia without behavioral disturbance (HCC)   Shortness of breath   Constipation   Aspiration pneumonia (HCC)   Acute on chronic respiratory failure with hypoxemia: likely due to COPD exacerbation and possible aspiration PNA vs. CAP. Pt had hx of aspiration PAN. CXR showed small patch infiltration in right base. Patient reportedly had elevated temperature of 100.1 at home, no fever in hospital. Covid 19 negative. SLP was done, per Orinda Kenner CCC-SLP, " It was noted then that pt has ESOPHAGEAL dysmotility w/ the attempt at NG "coiling" in her Esophagus. I encouraged her MD then to talk w/ the pt's Son about pt's ongoing risk for potential aspiration of REGURGITATED Reflux material in light of Esophageal phase dysmotility -- suspect could be Presbyesophagus w/ her age. Lungs tend to carry ongoing coarse markings w/ micro-aspiration of REFLUX material. Strongly recommend more soups and purees in her diet at home and NEVER lie down after meals for 1 hour".   - IV Vancomycin and cefepime, Zosyn Aztreonam were given initially -->switched to clindamycine. Will let pt complete total of 5 days of antibiotics.  - Mucinex for cough  - Solu-Medrol 60 mg daily --> decreased to 40 mg daily -->will give pt prednisone tappering. - Follow up blood culture x2, sputum culture no grow so far.  Anti-infectives (From admission, onward)   Start     Dose/Rate Route Frequency Ordered Stop   07/19/19 0000  clindamycin (CLEOCIN) 600 MG/50ML IVPB  Status:  Discontinued  600 mg Intravenous Every 8 hours 07/19/19 1622 07/19/19    07/19/19 0000  clindamycin (CLEOCIN) 300 MG capsule     300 mg Oral 3 times daily 07/19/19 1628 07/29/19 2359   07/18/19 2200  cefTRIAXone (ROCEPHIN) 1 g in sodium chloride 0.9 % 100 mL IVPB  Status:  Discontinued     1 g 200 mL/hr over 30 Minutes Intravenous Every 24 hours 07/18/19 0022 07/18/19  1128   07/18/19 2200  azithromycin (ZITHROMAX) 500 mg in sodium chloride 0.9 % 250 mL IVPB  Status:  Discontinued     500 mg 250 mL/hr over 60 Minutes Intravenous Every 24 hours 07/18/19 0022 07/18/19 1128   07/18/19 2200  cefTRIAXone (ROCEPHIN) 1 g in sodium chloride 0.9 % 100 mL IVPB  Status:  Discontinued     1 g 200 mL/hr over 30 Minutes Intravenous Every 24 hours 07/18/19 1152 07/18/19 1306   07/18/19 1400  clindamycin (CLEOCIN) IVPB 600 mg     600 mg 100 mL/hr over 30 Minutes Intravenous Every 8 hours 07/18/19 1306     07/18/19 1200  metroNIDAZOLE (FLAGYL) IVPB 500 mg  Status:  Discontinued     500 mg 100 mL/hr over 60 Minutes Intravenous Every 8 hours 07/18/19 1152 07/18/19 1306   07/17/19 2300  cefTRIAXone (ROCEPHIN) 1 g in sodium chloride 0.9 % 100 mL IVPB     1 g 200 mL/hr over 30 Minutes Intravenous  Once 07/17/19 2252 07/18/19 0017   07/17/19 2100  azithromycin (ZITHROMAX) 500 mg in sodium chloride 0.9 % 250 mL IVPB     500 mg 250 mL/hr over 60 Minutes Intravenous  Once 07/17/19 2055 07/17/19 2210   07/17/19 0000  azithromycin (ZITHROMAX Z-PAK) 250 MG tablet  Status:  Discontinued        07/17/19 2150 07/19/19       Hypertension and hypertensive urgency: bp is elevated, up to 191/91 on admssion -Continue metoprolol -Add amlodipine 5 mg daily -->will increase to 10 mg daily  Allergic reaction: Pt developed allergic reaction, possibly due to rocephin, less likely due to azithromycin.  Flagyl was ordered, but was not given yet. Discussed with pharmacist, and switched abx to clindamycin. Treated with prn Benadryl in hospital. Her rash has resolved at [facility].  Constipation -miralaxand senokot    Discharge Instructions  Discharge Instructions    Call MD for:  difficulty breathing, headache or visual disturbances   Complete by: As directed    Call MD for:  persistant dizziness or light-headedness   Complete by: As directed    Call MD for:  temperature >100.4    Complete by: As directed    Increase activity slowly   Complete by: As directed      Allergies as of 07/19/2019      Reactions   Aspirin Shortness Of Breath   Nsaids Shortness Of Breath, Anaphylaxis   Throat swelling, shortness of breath   Sulfa Antibiotics Other (See Comments)   Stomach pain Stomach pain   Ceftriaxone Itching   Itching and redness near infusion site.    Codeine Other (See Comments)   Short of breath   Ciprofloxacin Hives   Penicillins    Has patient had a PCN reaction causing immediate rash, facial/tongue/throat swelling, SOB or lightheadedness with hypotension: Unknown Has patient had a PCN reaction causing severe rash involving mucus membranes or skin necrosis: Unknown Has patient had a PCN reaction that required hospitalization: Unknown Has patient had a PCN reaction occurring within the last 10  years: Unknown If all of the above answers are "NO", then may proceed with Cephalosporin use. Other reaction(s): Angioedema (ALLERGY/intolerance) Hives, swelling   Phenol    Other reaction(s): Angioedema (ALLERGY/intolerance), Other (See Comments) Spasms, nerve symptoms Spasms, nerve symptoms Other reaction(s): Angioedema (ALLERGY/intolerance) Spasms, nerve symptoms   Promethazine Hcl       Medication List    TAKE these medications   acetaminophen 650 MG CR tablet Commonly known as: TYLENOL Take 650 mg by mouth every 8 (eight) hours as needed for pain.   albuterol (2.5 MG/3ML) 0.083% nebulizer solution Commonly known as: PROVENTIL Take 3 mLs (2.5 mg total) by nebulization every 3 (three) hours as needed for wheezing or shortness of breath.   amLODipine 10 MG tablet Commonly known as: NORVASC Take 1 tablet (10 mg total) by mouth daily.   clindamycin 300 MG capsule Commonly known as: CLEOCIN Take 1 capsule (300 mg total) by mouth 3 (three) times daily for 10 days.   cyanocobalamin 1000 MCG/ML injection Commonly known as: (VITAMIN B-12) Inject 1,000  mcg into the skin every 30 (thirty) days.   Fluticasone-Salmeterol 500-50 MCG/DOSE Aepb Commonly known as: ADVAIR Inhale 1 puff into the lungs 2 (two) times daily.   guaiFENesin 600 MG 12 hr tablet Commonly known as: MUCINEX Take 1 tablet (600 mg total) by mouth 2 (two) times daily.   metoprolol tartrate 25 MG tablet Commonly known as: LOPRESSOR Take 1 tablet (25 mg total) by mouth 2 (two) times daily.   polyethylene glycol 17 g packet Commonly known as: MIRALAX / GLYCOLAX Take 17 g by mouth daily as needed.   predniSONE 10 MG tablet Commonly known as: Deltasone Take 1-4 tablets (10-40 mg total) by mouth daily.   tiotropium 18 MCG inhalation capsule Commonly known as: Spiriva HandiHaler Place 1 capsule (18 mcg total) into inhaler and inhale daily.   traMADol 50 MG tablet Commonly known as: ULTRAM Take 1 tablet (50 mg total) by mouth 2 (two) times daily.            Durable Medical Equipment  (From admission, onward)         Start     Ordered   07/19/19 1619  DME 3-in-1  Once     07/19/19 1619         Follow-up Information    Baxter Hire, MD Follow up in 1 week(s).   Specialty: Internal Medicine Contact information: [place] [address] [phone]          Allergies  Allergen Reactions  . Aspirin Shortness Of Breath  . Nsaids Shortness Of Breath and Anaphylaxis    Throat swelling, shortness of breath  . Sulfa Antibiotics Other (See Comments)    Stomach pain Stomach pain   . Ceftriaxone Itching    Itching and redness near infusion site.   . Codeine Other (See Comments)    Short of breath   . Ciprofloxacin Hives  . Penicillins     Has patient had a PCN reaction causing immediate rash, facial/tongue/throat swelling, SOB or lightheadedness with hypotension: Unknown Has patient had a PCN reaction causing severe rash involving mucus membranes or skin necrosis: Unknown Has patient had a PCN reaction that required  hospitalization: Unknown Has patient had a PCN reaction occurring within the last 10 years: Unknown If all of the above answers are "NO", then may proceed with Cephalosporin use.  Other reaction(s): Angioedema (ALLERGY/intolerance) Hives, swelling  . Phenol     Other reaction(s): Angioedema (  ALLERGY/intolerance), Other (See Comments) Spasms, nerve symptoms Spasms, nerve symptoms  Other reaction(s): Angioedema (ALLERGY/intolerance) Spasms, nerve symptoms  . Promethazine Hcl     Consultations:  none   Procedures/Studies: Dg Chest Port 1 View  Result Date: 07/17/2019 CLINICAL DATA:  Shortness of breath and wheezing EXAM: PORTABLE CHEST 1 VIEW COMPARISON:  September 17, 2018 FINDINGS: The heart size and mediastinal contours are unchanged. Aortic knob calcifications are seen. There are coarsened interstitial markings seen at both lung bases as on the prior exam. There is however a new small patchy focus of opacity seen at the periphery of the right lung base. There is hyperinflation of the lung zones. No acute osseous abnormality. IMPRESSION: New small focus of patchy airspace opacity in the periphery of the right lung base which could be due to atelectasis and/or early infectious etiology. Chronic bibasilar coarsened interstitial markings. Electronically Signed   By: Prudencio Pair M.D.   On: 07/17/2019 19:17     Subjective: Patient feels better, still has some cough, but no chest pain, has mild shortness of breath.  Denies GI symptoms.  No fever or chills.  Discharge Exam: Vitals:   07/19/19 1335 07/19/19 1450  BP: (!) 164/81   Pulse: 82   Resp: 18   Temp:    SpO2: 100% 95%   Vitals:   07/19/19 0810 07/19/19 0823 07/19/19 1335 07/19/19 1450  BP:  (!) 192/93 (!) 164/81   Pulse: 64 72 82   Resp: 16 18 18    Temp:  97.8 F (36.6 C)    TempSrc:  Oral    SpO2: 98% 98% 100% 95%  Weight:      Height:        General: Pt is alert, awake, not in acute distress Cardiovascular: RRR,  S1/S2 +, no rubs, no gallops Respiratory: CTA bilaterally, no wheezing, no rhonchi Abdominal: Soft, NT, ND, bowel sounds + Extremities: no edema, no cyanosis    The results of significant diagnostics from this hospitalization (including imaging, microbiology, ancillary and laboratory) are listed below for reference.     Microbiology: Recent Results (from the past 240 hour(s))  SARS CORONAVIRUS 2 (TAT 6-24 HRS) Nasopharyngeal Nasopharyngeal Swab     Status: None   Collection Time: 07/17/19 11:38 PM   Specimen: Nasopharyngeal Swab  Result Value Ref Range Status   SARS Coronavirus 2 NEGATIVE NEGATIVE Final    Comment: (NOTE) SARS-CoV-2 target nucleic acids are NOT DETECTED. The SARS-CoV-2 RNA is generally detectable in upper and lower respiratory specimens during the acute phase of infection. Negative results do not preclude SARS-CoV-2 infection, do not rule out co-infections with other pathogens, and should not be used as the sole basis for treatment or other patient management decisions. Negative results must be combined with clinical observations, patient history, and epidemiological information. The expected result is Negative. Fact Sheet for Patients: SugarRoll.be Fact Sheet for Healthcare Providers: https://www.woods-mathews.com/ This test is not yet approved or cleared by the [place] FDA and  has been authorized for detection and/or diagnosis of SARS-CoV-2 by FDA under an Emergency Use Authorization (EUA). This EUA will remain  in effect (meaning this test can be used) for the duration of the COVID-19 declaration under Section 56 4(b)(1) of the Act, 21 U.S.C. section 360bbb-3(b)(1), unless the authorization is terminated or revoked sooner. Performed at [facility], [place] [address] [place], [place] [address]   Culture, blood (routine x 2) Call MD if unable to obtain prior to antibiotics being given  Status: None  (Preliminary result)   Collection Time: 07/18/19  8:33 AM   Specimen: BLOOD  Result Value Ref Range Status   Specimen Description BLOOD L AC  Final   Special Requests   Final    BOTTLES DRAWN AEROBIC AND ANAEROBIC Blood Culture adequate volume   Culture   Final    NO GROWTH < 24 HOURS Performed at [facility], [address] [place], [place] [address]    Report Status PENDING  Incomplete  Culture, blood (routine x 2) Call MD if unable to obtain prior to antibiotics being given     Status: None (Preliminary result)   Collection Time: 07/18/19  8:34 AM   Specimen: BLOOD  Result Value Ref Range Status   Specimen Description BLOOD L ARM  Final   Special Requests   Final    BOTTLES DRAWN AEROBIC AND ANAEROBIC Blood Culture adequate volume   Culture   Final    NO GROWTH < 24 HOURS Performed at [facility], [place] [place],  [address]    Report Status PENDING  Incomplete     Labs: BNP (last 3 results) No results for input(s): BNP in the last 8760 hours. Basic Metabolic Panel: Recent Labs  Lab 07/17/19 1813 07/18/19 0511 07/19/19 0453  NA 137 138 138  K 4.3 3.9 3.7  CL 100 103 103  CO2 27 26 26   GLUCOSE 96 149* 88  BUN 15 12 13   CREATININE 0.70 0.53 0.50  CALCIUM 9.3 9.0 9.1  MG  --   --  1.9   Liver Function Tests: No results for input(s): AST, ALT, ALKPHOS, BILITOT, PROT, ALBUMIN in the last 168 hours. No results for input(s): LIPASE, AMYLASE in the last 168 hours. No results for input(s): AMMONIA in the last 168 hours. CBC: Recent Labs  Lab 07/17/19 1813 07/18/19 0511 07/19/19 0453  WBC 6.3 3.7* 9.4  HGB 10.9* 10.6* 10.6*  HCT 35.6* 32.7* 32.5*  MCV 84.8 81.1 80.6  PLT 254 213 267   Cardiac Enzymes: No results for input(s): CKTOTAL, CKMB, CKMBINDEX, TROPONINI in the last 168 hours. BNP: Invalid input(s): POCBNP CBG: No results for input(s): GLUCAP in the last 168 hours. D-Dimer No results for input(s): DDIMER in the  last 72 hours. Hgb A1c No results for input(s): HGBA1C in the last 72 hours. Lipid Profile No results for input(s): CHOL, HDL, LDLCALC, TRIG, CHOLHDL, LDLDIRECT in the last 72 hours. Thyroid function studies No results for input(s): TSH, T4TOTAL, T3FREE, THYROIDAB in the last 72 hours.  Invalid input(s): FREET3 Anemia work up No results for input(s): VITAMINB12, FOLATE, FERRITIN, TIBC, IRON, RETICCTPCT in the last 72 hours. Urinalysis    Component Value Date/Time   COLORURINE YELLOW (A) 05/25/2019 0721   APPEARANCEUR CLEAR (A) 05/25/2019 0721   LABSPEC 1.013 05/25/2019 0721   PHURINE 5.0 05/25/2019 0721   GLUCOSEU NEGATIVE 05/25/2019 0721   HGBUR NEGATIVE 05/25/2019 0721   BILIRUBINUR NEGATIVE 05/25/2019 0721   KETONESUR 5 (A) 05/25/2019 0721   PROTEINUR 30 (A) 05/25/2019 0721   UROBILINOGEN 0.2 06/27/2010 1353   NITRITE NEGATIVE 05/25/2019 0721   LEUKOCYTESUR NEGATIVE 05/25/2019 0721   Sepsis Labs Invalid input(s): PROCALCITONIN,  WBC,  LACTICIDVEN Microbiology Recent Results (from the past 240 hour(s))  SARS CORONAVIRUS 2 (TAT 6-24 HRS) Nasopharyngeal Nasopharyngeal Swab     Status: None   Collection Time: 07/17/19 11:38 PM   Specimen: Nasopharyngeal Swab  Result Value Ref Range Status   SARS Coronavirus 2  NEGATIVE NEGATIVE Final    Comment: (NOTE) SARS-CoV-2 target nucleic acids are NOT DETECTED. The SARS-CoV-2 RNA is generally detectable in upper and lower respiratory specimens during the acute phase of infection. Negative results do not preclude SARS-CoV-2 infection, do not rule out co-infections with other pathogens, and should not be used as the sole basis for treatment or other patient management decisions. Negative results must be combined with clinical observations, patient history, and epidemiological information. The expected result is Negative. Fact Sheet for Patients: SugarRoll.be Fact Sheet for Healthcare  Providers: https://www.woods-mathews.com/ This test is not yet approved or cleared by the [place] FDA and  has been authorized for detection and/or diagnosis of SARS-CoV-2 by FDA under an Emergency Use Authorization (EUA). This EUA will remain  in effect (meaning this test can be used) for the duration of the COVID-19 declaration under Section 56 4(b)(1) of the Act, 21 U.S.C. section 360bbb-3(b)(1), unless the authorization is terminated or revoked sooner. Performed at [facility], [place] [address] [place], [place] [address]   Culture, blood (routine x 2) Call MD if unable to obtain prior to antibiotics being given     Status: None (Preliminary result)   Collection Time: 07/18/19  8:33 AM   Specimen: BLOOD  Result Value Ref Range Status   Specimen Description BLOOD L AC  Final   Special Requests   Final    BOTTLES DRAWN AEROBIC AND ANAEROBIC Blood Culture adequate volume   Culture   Final    NO GROWTH < 24 HOURS Performed at [facility], [address] [place], [place] [address]    Report Status PENDING  Incomplete  Culture, blood (routine x 2) Call MD if unable to obtain prior to antibiotics being given     Status: None (Preliminary result)   Collection Time: 07/18/19  8:34 AM   Specimen: BLOOD  Result Value Ref Range Status   Specimen Description BLOOD L ARM  Final   Special Requests   Final    BOTTLES DRAWN AEROBIC AND ANAEROBIC Blood Culture adequate volume   Culture   Final    NO GROWTH < 24 HOURS Performed at [facility], [address] [place],  [address]    Report Status PENDING  Incomplete    Time coordinating discharge:  35 minutes  SIGNED:  Ivor Costa, DO [facility] 07/19/2019, 4:30 PM Pager is on [place]  If 7PM-7AM, please contact night-coverage [email or web address] Password [email or web address]

## 2019-07-23 LAB — CULTURE, BLOOD (ROUTINE X 2)
Culture: NO GROWTH
Culture: NO GROWTH
Special Requests: ADEQUATE
Special Requests: ADEQUATE

## 2019-07-29 ENCOUNTER — Telehealth: Payer: Self-pay | Admitting: Family Medicine

## 2019-07-29 NOTE — Telephone Encounter (Signed)
-----   Message from Owens Loffler, MD sent at 07/18/2019  2:12 PM EST ----- Regarding: RE: New Patient Request Please tell Dr. Rock Nephew that I am happy to see his mother.  I would like a 40 minute office visit for her new patient appointment. ----- Message ----- From: Camillia Herter Sent: 07/16/2019   3:38 PM EST To: Owens Loffler, MD Subject: New Patient Request                            Dr.Copland, Dr.Kaneko called.  He said he'd like for you to take his mother as a New Patient.  Patient has asthma, high blood pressure and several issues.  I let Dr.Ringstad know that you're not taking new patients, but he asked me to ask you.  Dr. Rock Nephew can be reached at [phone]. Morey Hummingbird

## 2019-07-29 NOTE — Telephone Encounter (Signed)
I have left 2 messages on Dr.Kukla' voice mail to call back and schedule appointment for patient.

## 2019-07-31 IMAGING — DX DG ABD PORTABLE 1V
1 series · 1 of 1 positions shown · non-contrast
Comparison: Abdominal x-ray from same day at 0755.

CLINICAL DATA: Enteric tube adjustment.

EXAM:
PORTABLE ABDOMEN - 1 VIEW

[abdomen supine]
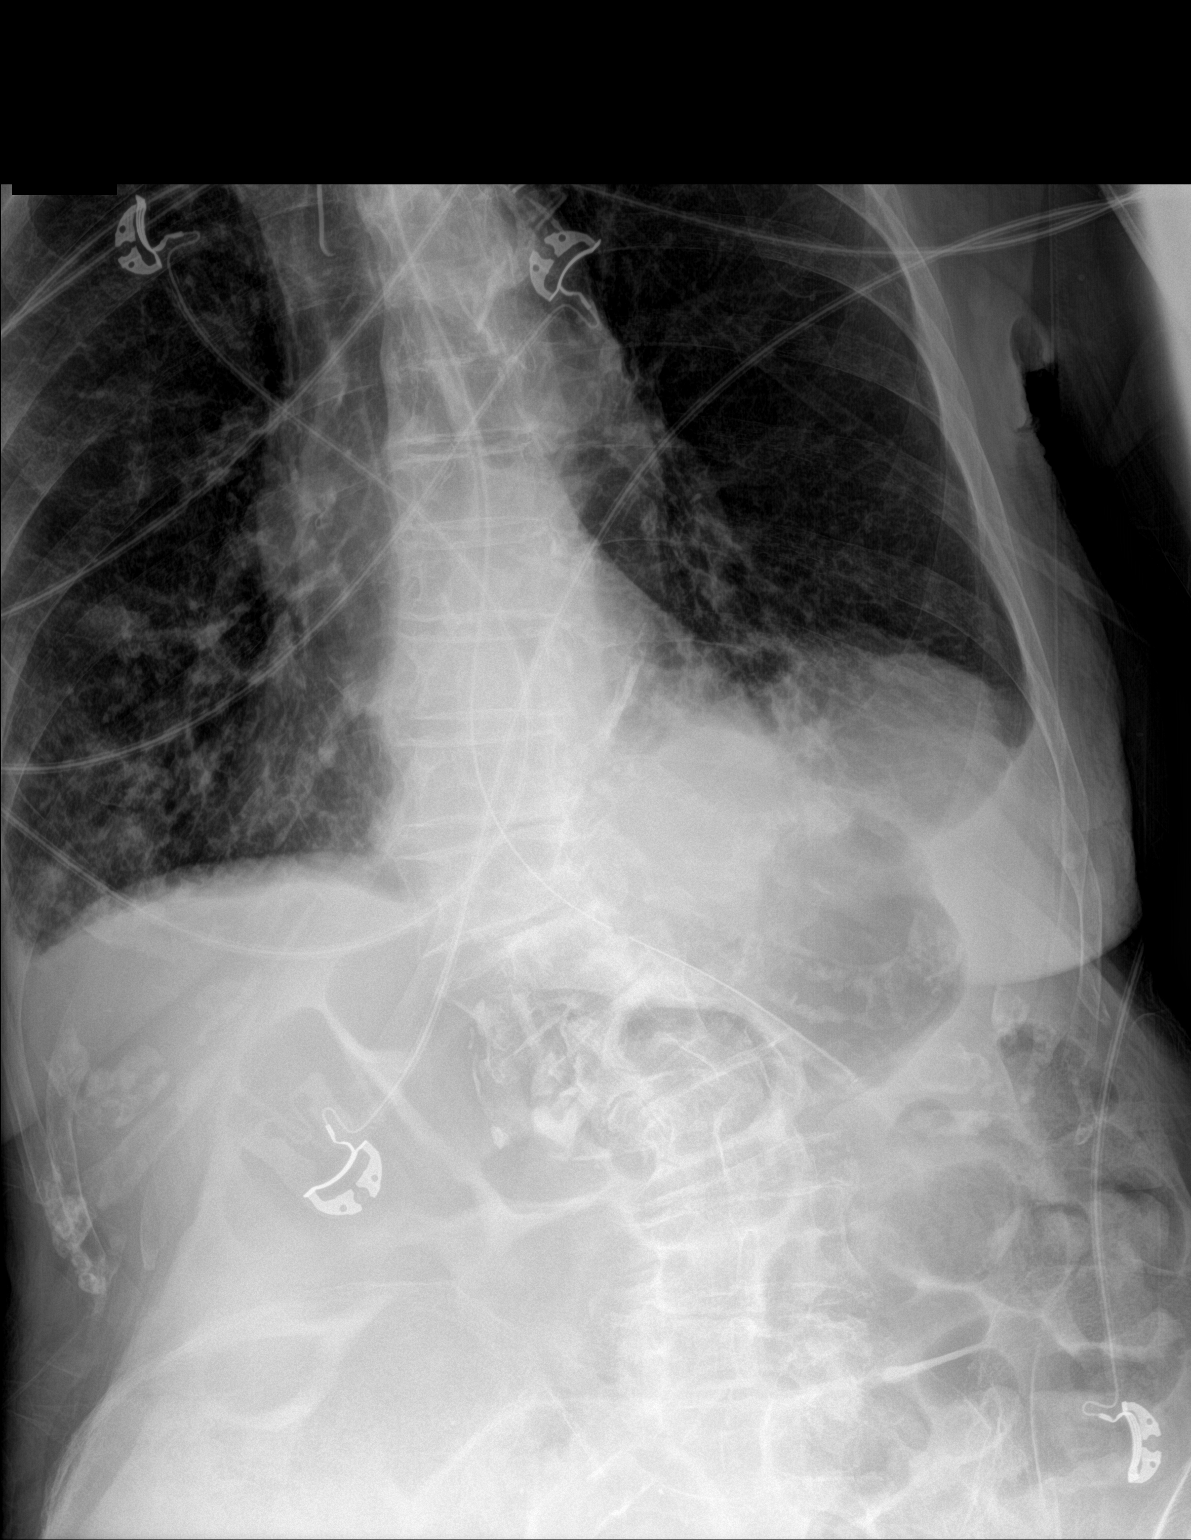

[1 of 1 positions shown; findings below may reference images not displayed]

FINDINGS: There has been slight advancement of the enteric tube, with the
distal side port now likely just beyond the gastroesophageal
junction. Tip remains in the stomach. The bowel gas pattern is
unchanged. Stable bibasilar opacities and small left pleural
effusion. Endotracheal tube appropriately positioned with the tip
4.7 cm above the carina.
IMPRESSION: 1. Slight advancement of the enteric tube with the distal side port
now likely just beyond the gastroesophageal junction.

## 2019-08-02 IMAGING — DX DG CHEST 1V
1 series · 1 of 1 positions shown · non-contrast
Comparison: Radiographs 09/15/2018

CLINICAL DATA: Acute respiratory distress.

EXAM:
CHEST  1 VIEW

[chest ap]
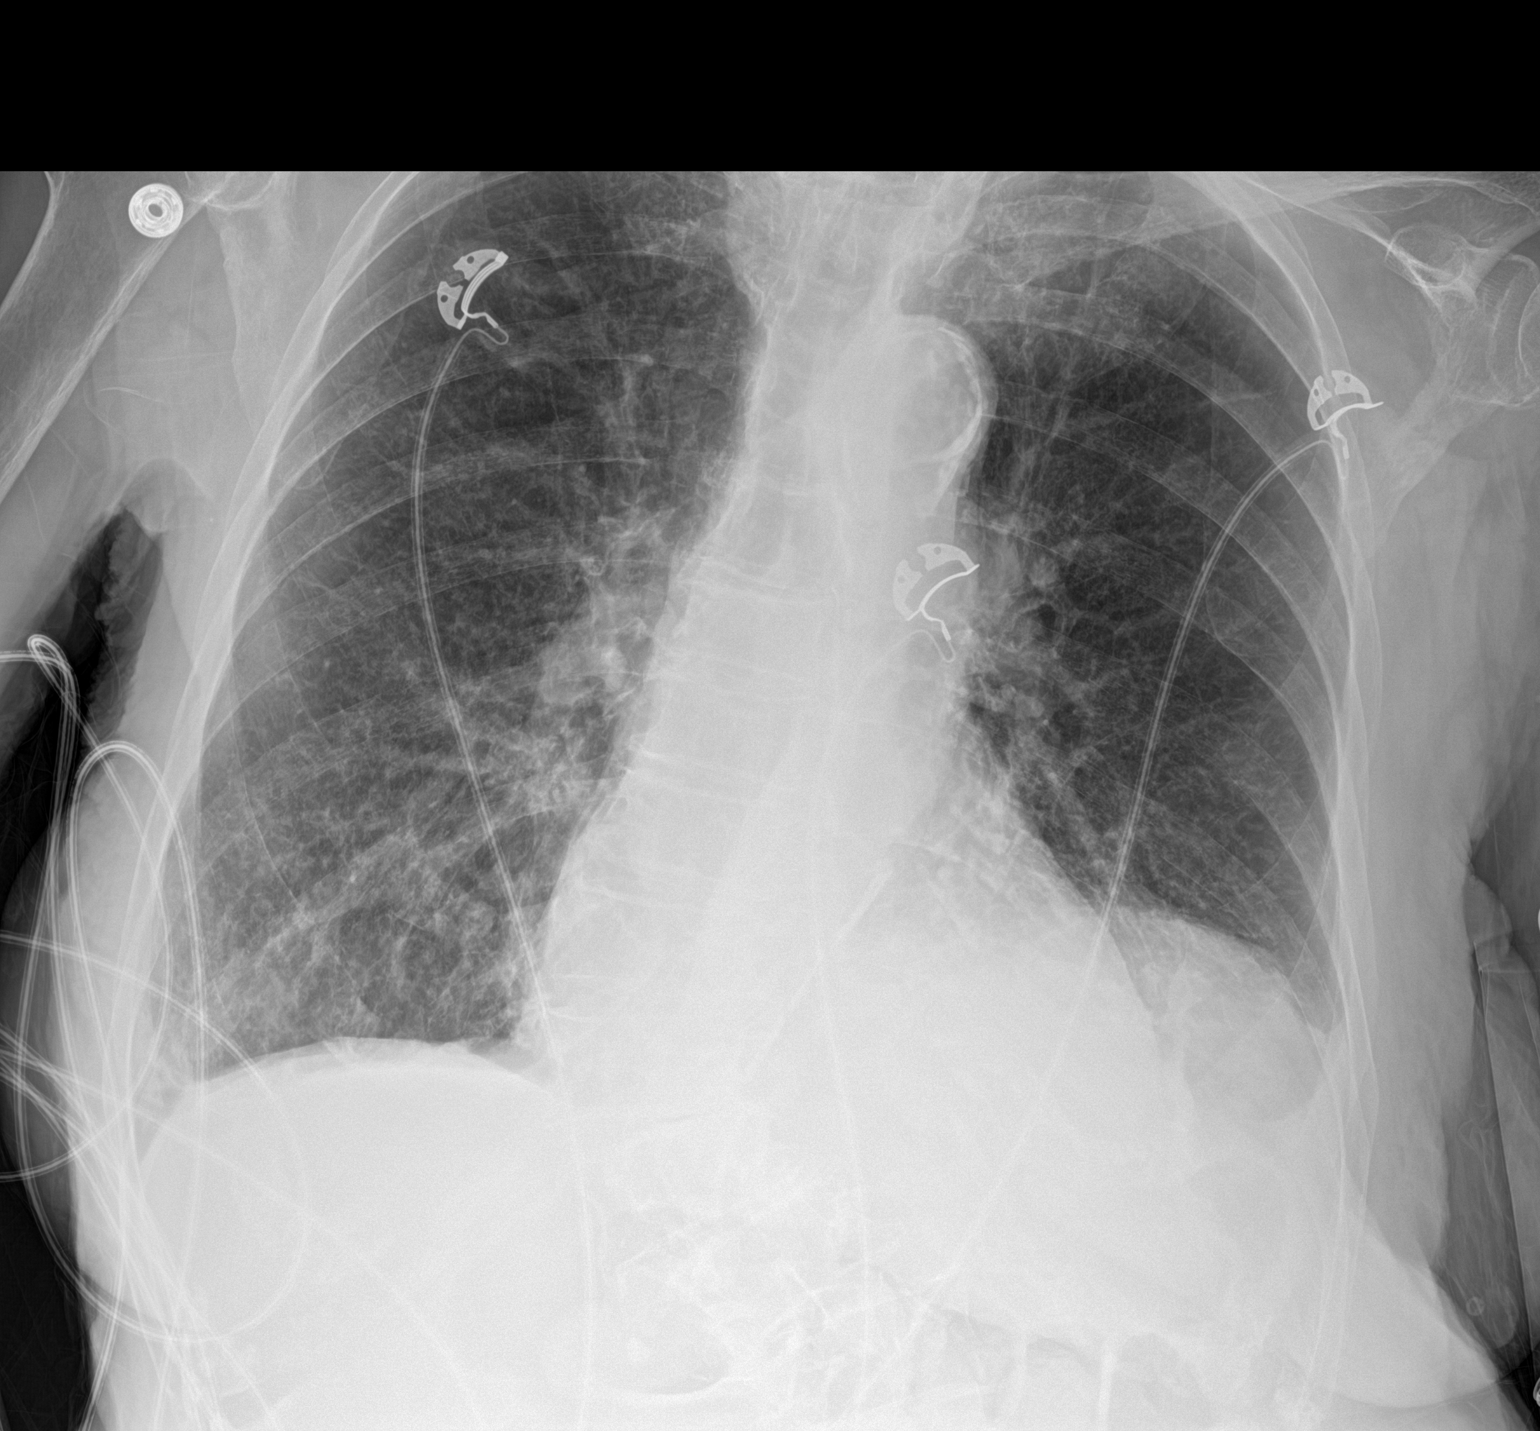

[1 of 1 positions shown; findings below may reference images not displayed]

FINDINGS: Endotracheal and enteric tubes have been removed. Patchy bibasilar
opacities are similar to prior exam. Slight improvement in left
pleural effusion. Unchanged heart size and mediastinal contours. No
pulmonary edema or pneumothorax. Scoliotic curvature of spine.
IMPRESSION: Patchy bibasilar opacities are similar to prior exam. Slight
improvement in left pleural effusion.

## 2019-08-17 DIAGNOSIS — I10 Essential (primary) hypertension: Secondary | ICD-10-CM | POA: Diagnosis not present

## 2019-08-17 DIAGNOSIS — R4182 Altered mental status, unspecified: Secondary | ICD-10-CM | POA: Diagnosis not present

## 2019-08-17 DIAGNOSIS — R531 Weakness: Secondary | ICD-10-CM | POA: Diagnosis not present

## 2019-11-23 DIAGNOSIS — R350 Frequency of micturition: Secondary | ICD-10-CM | POA: Diagnosis not present

## 2019-11-23 DIAGNOSIS — R509 Fever, unspecified: Secondary | ICD-10-CM | POA: Diagnosis not present

## 2020-01-13 DIAGNOSIS — I4891 Unspecified atrial fibrillation: Secondary | ICD-10-CM | POA: Diagnosis not present

## 2020-01-13 DIAGNOSIS — J441 Chronic obstructive pulmonary disease with (acute) exacerbation: Secondary | ICD-10-CM | POA: Diagnosis not present

## 2020-01-13 DIAGNOSIS — I4892 Unspecified atrial flutter: Secondary | ICD-10-CM | POA: Diagnosis not present

## 2020-01-13 DIAGNOSIS — R0602 Shortness of breath: Secondary | ICD-10-CM | POA: Diagnosis not present

## 2020-01-13 DIAGNOSIS — J431 Panlobular emphysema: Secondary | ICD-10-CM | POA: Diagnosis not present

## 2020-04-20 DIAGNOSIS — J439 Emphysema, unspecified: Secondary | ICD-10-CM | POA: Diagnosis not present

## 2020-04-20 DIAGNOSIS — M1990 Unspecified osteoarthritis, unspecified site: Secondary | ICD-10-CM | POA: Diagnosis not present

## 2020-04-20 DIAGNOSIS — J431 Panlobular emphysema: Secondary | ICD-10-CM | POA: Diagnosis not present

## 2020-12-05 DIAGNOSIS — D510 Vitamin B12 deficiency anemia due to intrinsic factor deficiency: Secondary | ICD-10-CM | POA: Diagnosis not present

## 2020-12-05 DIAGNOSIS — M1990 Unspecified osteoarthritis, unspecified site: Secondary | ICD-10-CM | POA: Diagnosis not present

## 2020-12-05 DIAGNOSIS — Z0000 Encounter for general adult medical examination without abnormal findings: Secondary | ICD-10-CM | POA: Diagnosis not present

## 2020-12-05 DIAGNOSIS — M810 Age-related osteoporosis without current pathological fracture: Secondary | ICD-10-CM | POA: Diagnosis not present

## 2020-12-05 DIAGNOSIS — I10 Essential (primary) hypertension: Secondary | ICD-10-CM | POA: Diagnosis not present

## 2020-12-05 DIAGNOSIS — J431 Panlobular emphysema: Secondary | ICD-10-CM | POA: Diagnosis not present

## 2020-12-05 DIAGNOSIS — I4892 Unspecified atrial flutter: Secondary | ICD-10-CM | POA: Diagnosis not present

## 2020-12-05 DIAGNOSIS — I4891 Unspecified atrial fibrillation: Secondary | ICD-10-CM | POA: Diagnosis not present

## 2020-12-05 DIAGNOSIS — E559 Vitamin D deficiency, unspecified: Secondary | ICD-10-CM | POA: Diagnosis not present

## 2021-01-05 DIAGNOSIS — R3915 Urgency of urination: Secondary | ICD-10-CM | POA: Diagnosis not present

## 2021-01-05 DIAGNOSIS — R410 Disorientation, unspecified: Secondary | ICD-10-CM | POA: Diagnosis not present

## 2021-02-28 DIAGNOSIS — M1990 Unspecified osteoarthritis, unspecified site: Secondary | ICD-10-CM | POA: Diagnosis not present

## 2021-02-28 DIAGNOSIS — D510 Vitamin B12 deficiency anemia due to intrinsic factor deficiency: Secondary | ICD-10-CM | POA: Diagnosis not present

## 2021-02-28 DIAGNOSIS — R531 Weakness: Secondary | ICD-10-CM | POA: Diagnosis not present

## 2021-02-28 DIAGNOSIS — J441 Chronic obstructive pulmonary disease with (acute) exacerbation: Secondary | ICD-10-CM | POA: Diagnosis not present

## 2021-03-30 DIAGNOSIS — M1990 Unspecified osteoarthritis, unspecified site: Secondary | ICD-10-CM | POA: Diagnosis not present

## 2021-03-30 DIAGNOSIS — R531 Weakness: Secondary | ICD-10-CM | POA: Diagnosis not present

## 2021-03-30 DIAGNOSIS — D510 Vitamin B12 deficiency anemia due to intrinsic factor deficiency: Secondary | ICD-10-CM | POA: Diagnosis not present

## 2021-03-30 DIAGNOSIS — J441 Chronic obstructive pulmonary disease with (acute) exacerbation: Secondary | ICD-10-CM | POA: Diagnosis not present

## 2021-04-30 DIAGNOSIS — M1990 Unspecified osteoarthritis, unspecified site: Secondary | ICD-10-CM | POA: Diagnosis not present

## 2021-04-30 DIAGNOSIS — J441 Chronic obstructive pulmonary disease with (acute) exacerbation: Secondary | ICD-10-CM | POA: Diagnosis not present

## 2021-04-30 DIAGNOSIS — R531 Weakness: Secondary | ICD-10-CM | POA: Diagnosis not present

## 2021-04-30 DIAGNOSIS — D510 Vitamin B12 deficiency anemia due to intrinsic factor deficiency: Secondary | ICD-10-CM | POA: Diagnosis not present

## 2021-05-31 DIAGNOSIS — R531 Weakness: Secondary | ICD-10-CM | POA: Diagnosis not present

## 2021-05-31 DIAGNOSIS — J441 Chronic obstructive pulmonary disease with (acute) exacerbation: Secondary | ICD-10-CM | POA: Diagnosis not present

## 2021-05-31 DIAGNOSIS — M1990 Unspecified osteoarthritis, unspecified site: Secondary | ICD-10-CM | POA: Diagnosis not present

## 2021-05-31 DIAGNOSIS — D510 Vitamin B12 deficiency anemia due to intrinsic factor deficiency: Secondary | ICD-10-CM | POA: Diagnosis not present

## 2021-06-06 DIAGNOSIS — J431 Panlobular emphysema: Secondary | ICD-10-CM | POA: Diagnosis not present

## 2021-06-06 DIAGNOSIS — Z23 Encounter for immunization: Secondary | ICD-10-CM | POA: Diagnosis not present

## 2021-06-06 DIAGNOSIS — I10 Essential (primary) hypertension: Secondary | ICD-10-CM | POA: Diagnosis not present

## 2021-06-06 DIAGNOSIS — E559 Vitamin D deficiency, unspecified: Secondary | ICD-10-CM | POA: Diagnosis not present

## 2021-06-06 DIAGNOSIS — D510 Vitamin B12 deficiency anemia due to intrinsic factor deficiency: Secondary | ICD-10-CM | POA: Diagnosis not present

## 2021-06-06 DIAGNOSIS — I4891 Unspecified atrial fibrillation: Secondary | ICD-10-CM | POA: Diagnosis not present

## 2021-06-06 DIAGNOSIS — I4892 Unspecified atrial flutter: Secondary | ICD-10-CM | POA: Diagnosis not present

## 2021-06-30 DIAGNOSIS — M1990 Unspecified osteoarthritis, unspecified site: Secondary | ICD-10-CM | POA: Diagnosis not present

## 2021-06-30 DIAGNOSIS — D510 Vitamin B12 deficiency anemia due to intrinsic factor deficiency: Secondary | ICD-10-CM | POA: Diagnosis not present

## 2021-06-30 DIAGNOSIS — R531 Weakness: Secondary | ICD-10-CM | POA: Diagnosis not present

## 2021-06-30 DIAGNOSIS — J441 Chronic obstructive pulmonary disease with (acute) exacerbation: Secondary | ICD-10-CM | POA: Diagnosis not present

## 2021-07-31 DIAGNOSIS — M1990 Unspecified osteoarthritis, unspecified site: Secondary | ICD-10-CM | POA: Diagnosis not present

## 2021-07-31 DIAGNOSIS — D510 Vitamin B12 deficiency anemia due to intrinsic factor deficiency: Secondary | ICD-10-CM | POA: Diagnosis not present

## 2021-07-31 DIAGNOSIS — R531 Weakness: Secondary | ICD-10-CM | POA: Diagnosis not present

## 2021-07-31 DIAGNOSIS — J441 Chronic obstructive pulmonary disease with (acute) exacerbation: Secondary | ICD-10-CM | POA: Diagnosis not present

## 2022-01-11 ENCOUNTER — Emergency Department
Admission: EM | Admit: 2022-01-11 | Discharge: 2022-01-11 | Disposition: A | Discharge status: Home / Self Care | Payer: PPO | Attending: Emergency Medicine | Admitting: Emergency Medicine

## 2022-01-11 ENCOUNTER — Emergency Department: Payer: PPO

## 2022-01-11 DIAGNOSIS — F03A0 Unspecified dementia, mild, without behavioral disturbance, psychotic disturbance, mood disturbance, and anxiety: Secondary | ICD-10-CM | POA: Insufficient documentation

## 2022-01-11 DIAGNOSIS — R4182 Altered mental status, unspecified: Secondary | ICD-10-CM | POA: Diagnosis present

## 2022-01-11 DIAGNOSIS — J45909 Unspecified asthma, uncomplicated: Secondary | ICD-10-CM | POA: Diagnosis not present

## 2022-01-11 LAB — URINALYSIS, COMPLETE (UACMP) WITH MICROSCOPIC
Bacteria, UA: NONE SEEN
Bilirubin Urine: NEGATIVE
Glucose, UA: NEGATIVE mg/dL
Hgb urine dipstick: NEGATIVE
Ketones, ur: NEGATIVE mg/dL
Leukocytes,Ua: NEGATIVE
Nitrite: NEGATIVE
Protein, ur: NEGATIVE mg/dL
Specific Gravity, Urine: 1.015 (ref 1.005–1.030)
Squamous Epithelial / HPF: NONE SEEN (ref 0–5)
pH: 5 (ref 5.0–8.0)

## 2022-01-11 LAB — COMPREHENSIVE METABOLIC PANEL
ALT: 12 U/L (ref 0–44)
AST: 21 U/L (ref 15–41)
Albumin: 4 g/dL (ref 3.5–5.0)
Alkaline Phosphatase: 37 U/L — ABNORMAL LOW (ref 38–126)
Anion gap: 10 (ref 5–15)
BUN: 23 mg/dL (ref 8–23)
CO2: 26 mmol/L (ref 22–32)
Calcium: 9.3 mg/dL (ref 8.9–10.3)
Chloride: 100 mmol/L (ref 98–111)
Creatinine, Ser: 0.67 mg/dL (ref 0.44–1.00)
GFR, Estimated: >60 mL/min (ref >60)
Glucose, Bld: 117 mg/dL — ABNORMAL HIGH (ref 70–99)
Potassium: 4.5 mmol/L (ref 3.5–5.1)
Sodium: 136 mmol/L (ref 135–145)
Total Bilirubin: 0.9 mg/dL (ref 0.3–1.2)
Total Protein: 7.5 g/dL (ref 6.5–8.1)

## 2022-01-11 LAB — CBC
HCT: 36 % (ref 36.0–46.0)
Hemoglobin: 11.5 g/dL — ABNORMAL LOW (ref 12.0–15.0)
MCH: 27.4 pg (ref 26.0–34.0)
MCHC: 31.9 g/dL (ref 30.0–36.0)
MCV: 85.7 fL (ref 80.0–100.0)
Platelets: 212 10*3/uL (ref 150–400)
RBC: 4.2 MIL/uL (ref 3.87–5.11)
RDW: 15.6 % — ABNORMAL HIGH (ref 11.5–15.5)
WBC: 9 10*3/uL (ref 4.0–10.5)
nRBC: 0 % (ref 0.0–0.2)

## 2022-01-11 LAB — MAGNESIUM: Magnesium: 2.4 mg/dL (ref 1.7–2.4)

## 2022-01-11 LAB — TROPONIN I (HIGH SENSITIVITY): Troponin I (High Sensitivity): 19 ng/L — ABNORMAL HIGH (ref <18)

## 2022-01-11 NOTE — ED Triage Notes (Signed)
Patient to ER via POV with family. Family member reports that patient picked up a piece of toilet paper and stated that she was going to fry up a piece of beef for the dog. Altered mental status started yesterday, has been taking a z-pack and prednisone. Mental status declined over night. Potential UTI.  ? ? ?Baseline, patient is hard of hearing but a+ox4.  ?

## 2022-01-11 NOTE — ED Provider Notes (Signed)
? ?[facility] ?Provider Note ? ? Event Date/Time  ? First MD Initiated Contact with Patient 01/11/22 1555   ?  (approximate) ?History  ?Altered Mental Status ? ?HPI ?Janice Hoffman is a 88 y.o. female with a stated past medical history of dementia and asthma who presents for altered mental status that began this morning.  Patient caregiver at bedside and states that she "picked up a wad of toilet paper and said she was going to cook some ground beef before placing the ball of toilet paper on her bedside commode as if it was a stove".  Patient was started yesterday on a Z-Pak and prednisone for an asthma exacerbation however patient's caretaker states that she has had multiple rounds of steroids before without this mental status change.  Caretaker does say the patient has had similar symptoms with urinary tract infections in the past and is concerned that may be what is going on at this time.  Patient is only oriented to self at this time.  Caretaker states that she is normally oriented to place but never to time and that this is a significant change from her baseline.  No complaints of pain ?Physical Exam  ?Triage Vital Signs: ?ED Triage Vitals  ?Enc Vitals Group  ?   BP 01/11/22 1329 (!) 169/90  ?   Pulse Rate 01/11/22 1329 95  ?   Resp 01/11/22 1329 17  ?   Temp 01/11/22 1329 98.2 ?F (36.8 ?C)  ?   Temp Source 01/11/22 1329 Oral  ?   SpO2 01/11/22 1329 95 %  ?   Weight --   ?   Height 01/11/22 1330 '5\' 4"'$  (1.626 m)  ?   Head Circumference --   ?   Peak Flow --   ?   Pain Score --   ?   Pain Loc --   ?   Pain Edu? --   ?   Excl. in [place]? --   ? ?Most recent vital signs: ?Vitals:  ? 01/11/22 1700 01/11/22 1730  ?BP: 138/74 139/78  ?Pulse: 75 81  ?Resp: (!) 21 (!) 21  ?Temp:    ?SpO2: 96% 96%  ? ?General: Awake, oriented only to self ?CV:  Good peripheral perfusion.  ?Resp:  Normal effort.  ?Abd:  No distention.  ?Other:  Elderly Caucasian female laying in bed in no distress.  Extremely hard of  hearing. ?ED Results / Procedures / Treatments  ?Labs ?(all labs ordered are listed, but only abnormal results are displayed) ?Labs Reviewed  ?COMPREHENSIVE METABOLIC PANEL - Abnormal; Notable for the following components:  ?    Result Value  ? Glucose, Bld 117 (*)   ? Alkaline Phosphatase 37 (*)   ? All other components within normal limits  ?CBC - Abnormal; Notable for the following components:  ? Hemoglobin 11.5 (*)   ? RDW 15.6 (*)   ? All other components within normal limits  ?URINALYSIS, COMPLETE (UACMP) WITH MICROSCOPIC - Abnormal; Notable for the following components:  ? Color, Urine YELLOW (*)   ? APPearance CLEAR (*)   ? All other components within normal limits  ?TROPONIN I (HIGH SENSITIVITY) - Abnormal; Notable for the following components:  ? Troponin I (High Sensitivity) 19 (*)   ? All other components within normal limits  ?MAGNESIUM  ? ?EKG ?ED ECG REPORT ?I, Naaman Plummer, the attending physician, personally viewed and interpreted this ECG. ?Date: 01/11/2022 ?EKG Time: 1332 ?Rate: 92 ?Rhythm: normal sinus  rhythm ?QRS Axis: normal ?Intervals: normal ?ST/T Wave abnormalities: normal ?Narrative Interpretation: no evidence of acute ischemia ?RADIOLOGY ?ED MD interpretation: Single view portable chest x-ray interpreted by me and shows a right basilar opacity that is possibly atelectasis versus pneumonia ?-Agree with radiology assessment ?Official radiology report(s): ?No results found. ?PROCEDURES: ?Critical Care performed: No ?.1-3 Lead EKG Interpretation ?Performed by: Naaman Plummer, MD ?Authorized by: Naaman Plummer, MD  ? ?  Interpretation: normal   ?  ECG rate:  82 ?  ECG rate assessment: normal   ?  Rhythm: sinus rhythm   ?  Ectopy: none   ?  Conduction: normal   ?MEDICATIONS ORDERED IN ED: ?Medications - No data to display ?IMPRESSION / MDM / ASSESSMENT AND PLAN / ED COURSE  ?I reviewed the triage vital signs and the nursing notes. ?             ?               ?The patient is on the cardiac  monitor to evaluate for evidence of arrhythmia and/or significant heart rate changes. ?The patient suffered an episode of altered mental status, but there is no overt concern for a dangerous emergent cause such as, but not limited to, CNS infection, severe Toxidrome, severe metabolic derangement, or stroke. ? ?Given History, Physical, and Workup the cause appears to be sequelae of of age-related dementia ? ?Disposition: Discharge. At the time of discharge, the patient is back to baseline mental status. ? ?  ?FINAL CLINICAL IMPRESSION(S) / ED DIAGNOSES  ? ?Final diagnoses:  ?Altered mental status, unspecified altered mental status type  ?Mild dementia, unspecified dementia type, unspecified whether behavioral, psychotic, or mood disturbance or anxiety ([place])  ? ?Rx / DC Orders  ? ?ED Discharge Orders   ? ? None  ? ?  ? ?Note:  This document was prepared using Dragon voice recognition software and may include unintentional dictation errors. ?  ?Naaman Plummer, MD ?01/13/22 2050 ? ?

## 2022-01-11 NOTE — ED Notes (Signed)
RN to bedside to introduce self to pt. Assisted pt to toilet to provide specimen.  ?

## 2022-05-12 ENCOUNTER — Other Ambulatory Visit: Payer: Self-pay

## 2022-05-12 ENCOUNTER — Emergency Department: Payer: PPO

## 2022-05-12 ENCOUNTER — Inpatient Hospital Stay
Admission: EM | Admit: 2022-05-12 | Discharge: 2022-05-18 | DRG: 542 | Disposition: A | Discharge status: Skilled Nursing Facility | Payer: PPO | Attending: Internal Medicine | Admitting: Internal Medicine

## 2022-05-12 DIAGNOSIS — S72442D Displaced fracture of lower epiphysis (separation) of left femur, subsequent encounter for closed fracture with routine healing: Secondary | ICD-10-CM

## 2022-05-12 DIAGNOSIS — F03911 Unspecified dementia, unspecified severity, with agitation: Secondary | ICD-10-CM | POA: Diagnosis present

## 2022-05-12 DIAGNOSIS — Z880 Allergy status to penicillin: Secondary | ICD-10-CM | POA: Diagnosis not present

## 2022-05-12 DIAGNOSIS — G4700 Insomnia, unspecified: Secondary | ICD-10-CM | POA: Diagnosis not present

## 2022-05-12 DIAGNOSIS — M80052A Age-related osteoporosis with current pathological fracture, left femur, initial encounter for fracture: Secondary | ICD-10-CM | POA: Diagnosis present

## 2022-05-12 DIAGNOSIS — Z8673 Personal history of transient ischemic attack (TIA), and cerebral infarction without residual deficits: Secondary | ICD-10-CM

## 2022-05-12 DIAGNOSIS — Z825 Family history of asthma and other chronic lower respiratory diseases: Secondary | ICD-10-CM

## 2022-05-12 DIAGNOSIS — F03918 Unspecified dementia, unspecified severity, with other behavioral disturbance: Secondary | ICD-10-CM | POA: Diagnosis present

## 2022-05-12 DIAGNOSIS — G301 Alzheimer's disease with late onset: Secondary | ICD-10-CM | POA: Diagnosis present

## 2022-05-12 DIAGNOSIS — J9601 Acute respiratory failure with hypoxia: Secondary | ICD-10-CM | POA: Diagnosis not present

## 2022-05-12 DIAGNOSIS — M810 Age-related osteoporosis without current pathological fracture: Secondary | ICD-10-CM | POA: Diagnosis present

## 2022-05-12 DIAGNOSIS — Z882 Allergy status to sulfonamides status: Secondary | ICD-10-CM

## 2022-05-12 DIAGNOSIS — Z888 Allergy status to other drugs, medicaments and biological substances status: Secondary | ICD-10-CM | POA: Diagnosis not present

## 2022-05-12 DIAGNOSIS — D510 Vitamin B12 deficiency anemia due to intrinsic factor deficiency: Secondary | ICD-10-CM | POA: Diagnosis present

## 2022-05-12 DIAGNOSIS — Z79899 Other long term (current) drug therapy: Secondary | ICD-10-CM

## 2022-05-12 DIAGNOSIS — Z886 Allergy status to analgesic agent status: Secondary | ICD-10-CM

## 2022-05-12 DIAGNOSIS — R062 Wheezing: Secondary | ICD-10-CM | POA: Diagnosis not present

## 2022-05-12 DIAGNOSIS — J449 Chronic obstructive pulmonary disease, unspecified: Secondary | ICD-10-CM | POA: Diagnosis not present

## 2022-05-12 DIAGNOSIS — Z7951 Long term (current) use of inhaled steroids: Secondary | ICD-10-CM | POA: Diagnosis not present

## 2022-05-12 DIAGNOSIS — I10 Essential (primary) hypertension: Secondary | ICD-10-CM | POA: Diagnosis present

## 2022-05-12 DIAGNOSIS — Z885 Allergy status to narcotic agent status: Secondary | ICD-10-CM | POA: Diagnosis not present

## 2022-05-12 DIAGNOSIS — D509 Iron deficiency anemia, unspecified: Secondary | ICD-10-CM | POA: Diagnosis present

## 2022-05-12 DIAGNOSIS — W19XXXA Unspecified fall, initial encounter: Secondary | ICD-10-CM | POA: Diagnosis present

## 2022-05-12 DIAGNOSIS — J690 Pneumonitis due to inhalation of food and vomit: Secondary | ICD-10-CM | POA: Diagnosis not present

## 2022-05-12 DIAGNOSIS — J441 Chronic obstructive pulmonary disease with (acute) exacerbation: Secondary | ICD-10-CM | POA: Diagnosis present

## 2022-05-12 DIAGNOSIS — R710 Precipitous drop in hematocrit: Secondary | ICD-10-CM

## 2022-05-12 DIAGNOSIS — S72492A Other fracture of lower end of left femur, initial encounter for closed fracture: Secondary | ICD-10-CM

## 2022-05-12 DIAGNOSIS — S72402A Unspecified fracture of lower end of left femur, initial encounter for closed fracture: Secondary | ICD-10-CM

## 2022-05-12 DIAGNOSIS — S7292XA Unspecified fracture of left femur, initial encounter for closed fracture: Secondary | ICD-10-CM | POA: Diagnosis present

## 2022-05-12 DIAGNOSIS — F0390 Unspecified dementia without behavioral disturbance: Secondary | ICD-10-CM | POA: Diagnosis not present

## 2022-05-12 DIAGNOSIS — N179 Acute kidney failure, unspecified: Secondary | ICD-10-CM | POA: Diagnosis not present

## 2022-05-12 DIAGNOSIS — Z66 Do not resuscitate: Secondary | ICD-10-CM | POA: Diagnosis present

## 2022-05-12 DIAGNOSIS — H9190 Unspecified hearing loss, unspecified ear: Secondary | ICD-10-CM | POA: Diagnosis present

## 2022-05-12 DIAGNOSIS — S72402S Unspecified fracture of lower end of left femur, sequela: Secondary | ICD-10-CM | POA: Diagnosis not present

## 2022-05-12 LAB — CBC WITH DIFFERENTIAL/PLATELET
Abs Immature Granulocytes: 0.02 10*3/uL (ref 0.00–0.07)
Basophils Absolute: 0.1 10*3/uL (ref 0.0–0.1)
Basophils Relative: 1 %
Eosinophils Absolute: 0.4 10*3/uL (ref 0.0–0.5)
Eosinophils Relative: 6 %
HCT: 37.7 % (ref 36.0–46.0)
Hemoglobin: 11.7 g/dL — ABNORMAL LOW (ref 12.0–15.0)
Immature Granulocytes: 0 %
Lymphocytes Relative: 25 %
Lymphs Abs: 1.6 10*3/uL (ref 0.7–4.0)
MCH: 27.7 pg (ref 26.0–34.0)
MCHC: 31 g/dL (ref 30.0–36.0)
MCV: 89.3 fL (ref 80.0–100.0)
Monocytes Absolute: 0.5 10*3/uL (ref 0.1–1.0)
Monocytes Relative: 7 %
Neutro Abs: 4.1 10*3/uL (ref 1.7–7.7)
Neutrophils Relative %: 61 %
Platelets: 225 10*3/uL (ref 150–400)
RBC: 4.22 MIL/uL (ref 3.87–5.11)
RDW: 14.6 % (ref 11.5–15.5)
WBC: 6.6 10*3/uL (ref 4.0–10.5)
nRBC: 0 % (ref 0.0–0.2)

## 2022-05-12 LAB — BASIC METABOLIC PANEL
Anion gap: 4 — ABNORMAL LOW (ref 5–15)
BUN: 11 mg/dL (ref 8–23)
CO2: 28 mmol/L (ref 22–32)
Calcium: 8.6 mg/dL — ABNORMAL LOW (ref 8.9–10.3)
Chloride: 108 mmol/L (ref 98–111)
Creatinine, Ser: 0.56 mg/dL (ref 0.44–1.00)
GFR, Estimated: >60 mL/min (ref >60)
Glucose, Bld: 99 mg/dL (ref 70–99)
Potassium: 4 mmol/L (ref 3.5–5.1)
Sodium: 140 mmol/L (ref 135–145)

## 2022-05-12 MED ORDER — HYDROCODONE-ACETAMINOPHEN 5-325 MG PO TABS
1.0000 | ORAL_TABLET | ORAL | Status: DC | PRN
  Administered 2022-05-12 – 2022-05-13 (×4): 2 via ORAL
  Filled 2022-05-12 (×4): qty 2

## 2022-05-12 MED ORDER — SODIUM CHLORIDE 0.9 % IV SOLN: 250.0000 mL | INTRAVENOUS | Status: DC | PRN

## 2022-05-12 MED ORDER — SODIUM CHLORIDE 0.9% FLUSH
3.0000 mL | INTRAVENOUS | Status: DC | PRN
  Administered 2022-05-16: 3 mL via INTRAVENOUS

## 2022-05-12 MED ORDER — SODIUM CHLORIDE 0.9% FLUSH
3.0000 mL | Freq: Two times a day (BID) | INTRAVENOUS | Status: DC
Start: 2022-05-12 — End: 2022-05-18
  Administered 2022-05-12 – 2022-05-18 (×8): 3 mL via INTRAVENOUS

## 2022-05-12 MED ORDER — POLYETHYLENE GLYCOL 3350 17 G PO PACK
17.0000 g | PACK | Freq: Every day | ORAL | Status: DC | PRN
Start: 2022-05-12 — End: 2022-05-18
  Administered 2022-05-12 – 2022-05-13 (×2): 17 g via ORAL
  Filled 2022-05-12 (×2): qty 1

## 2022-05-12 MED ORDER — METOPROLOL TARTRATE 5 MG/5ML IV SOLN
5.0000 mg | Freq: Four times a day (QID) | INTRAVENOUS | Status: DC | PRN
  Administered 2022-05-13: 5 mg via INTRAVENOUS
  Filled 2022-05-12 (×2): qty 5

## 2022-05-12 MED ORDER — ALBUTEROL SULFATE (2.5 MG/3ML) 0.083% IN NEBU: 2.5000 mg | INHALATION_SOLUTION | RESPIRATORY_TRACT | Status: DC | PRN

## 2022-05-12 MED ORDER — ACETAMINOPHEN 650 MG RE SUPP: 650.0000 mg | Freq: Four times a day (QID) | RECTAL | Status: DC | PRN

## 2022-05-12 MED ORDER — FENTANYL CITRATE PF 50 MCG/ML IJ SOSY
12.5000 ug | PREFILLED_SYRINGE | INTRAMUSCULAR | Status: DC | PRN
  Administered 2022-05-12: 50 ug via INTRAVENOUS
  Filled 2022-05-12: qty 1

## 2022-05-12 MED ORDER — AMLODIPINE BESYLATE 10 MG PO TABS
10.0000 mg | ORAL_TABLET | Freq: Every day | ORAL | Status: DC
  Administered 2022-05-12 – 2022-05-18 (×5): 10 mg via ORAL
  Filled 2022-05-12 (×4): qty 1
  Filled 2022-05-12: qty 2
  Filled 2022-05-12: qty 1

## 2022-05-12 MED ORDER — ONDANSETRON HCL 4 MG/2ML IJ SOLN: 4.0000 mg | Freq: Four times a day (QID) | INTRAMUSCULAR | Status: DC | PRN

## 2022-05-12 MED ORDER — THIAMINE MONONITRATE 100 MG PO TABS
100.0000 mg | ORAL_TABLET | Freq: Every day | ORAL | Status: DC
  Administered 2022-05-12 – 2022-05-18 (×7): 100 mg via ORAL
  Filled 2022-05-12 (×7): qty 1

## 2022-05-12 MED ORDER — CLOPIDOGREL BISULFATE 75 MG PO TABS
75.0000 mg | ORAL_TABLET | Freq: Every day | ORAL | Status: DC
  Administered 2022-05-12 – 2022-05-18 (×7): 75 mg via ORAL
  Filled 2022-05-12 (×7): qty 1

## 2022-05-12 MED ORDER — ONDANSETRON HCL 4 MG PO TABS: 4.0000 mg | ORAL_TABLET | Freq: Four times a day (QID) | ORAL | Status: DC | PRN

## 2022-05-12 MED ORDER — ENOXAPARIN SODIUM 40 MG/0.4ML IJ SOSY
40.0000 mg | PREFILLED_SYRINGE | INTRAMUSCULAR | Status: DC
  Administered 2022-05-12 – 2022-05-13 (×2): 40 mg via SUBCUTANEOUS
  Filled 2022-05-12 (×2): qty 0.4

## 2022-05-12 MED ORDER — METOPROLOL TARTRATE 25 MG PO TABS
25.0000 mg | ORAL_TABLET | Freq: Two times a day (BID) | ORAL | Status: DC
  Administered 2022-05-12 – 2022-05-18 (×13): 25 mg via ORAL
  Filled 2022-05-12 (×13): qty 1

## 2022-05-12 MED ORDER — MOMETASONE FURO-FORMOTEROL FUM 200-5 MCG/ACT IN AERO
2.0000 | INHALATION_SPRAY | Freq: Two times a day (BID) | RESPIRATORY_TRACT | Status: DC
  Administered 2022-05-13: 2 via RESPIRATORY_TRACT
  Filled 2022-05-12: qty 8.8

## 2022-05-12 MED ORDER — TIOTROPIUM BROMIDE MONOHYDRATE 18 MCG IN CAPS
18.0000 ug | ORAL_CAPSULE | Freq: Every day | RESPIRATORY_TRACT | Status: DC
  Filled 2022-05-12: qty 5

## 2022-05-12 MED ORDER — ACETAMINOPHEN 325 MG PO TABS
650.0000 mg | ORAL_TABLET | Freq: Four times a day (QID) | ORAL | Status: DC | PRN
Start: 2022-05-12 — End: 2022-05-13

## 2022-05-12 MED ORDER — FENTANYL CITRATE PF 50 MCG/ML IJ SOSY
50.0000 ug | PREFILLED_SYRINGE | Freq: Once | INTRAMUSCULAR | Status: AC
  Administered 2022-05-12: 50 ug via INTRAVENOUS
  Filled 2022-05-12: qty 1

## 2022-05-12 NOTE — H&P (Signed)
History and Physical    Patient: Janice Hoffman WJX:[id number] DOB: 11-11-1925 DOA: 05/12/2022 DOS: the patient was seen and examined on 05/12/2022 PCP: Baxter Hire, MD  Patient coming from: Home Lives with son and grandchildren. Has in home care givers. Usually walks with a walker  Chief Complaint:  Chief Complaint  Patient presents with   Fall   HPI: Janice Hoffman is a 88 y.o. female with medical history significant of osteoporosis and dementia. Tried to walk early this am and fell. NO acute head injury. Reported pain in left knee. Found to have a non-displaced femur fracture of that leg. Family did not feel safe taking her home, given baseline limited mobility.   Review of Systems: As mentioned in the history of present illness. All other systems reviewed and are negative. Past Medical History:  Diagnosis Date   Asthma    COPD (chronic obstructive pulmonary disease) ([place])    Herpes zoster    Hypertension    Neuralgia    Past Surgical History:  Procedure Laterality Date   LEG SURGERY     Social History:  reports that she has never smoked. She has never used smokeless tobacco. She reports that she does not drink alcohol and does not use drugs.  Allergies  Allergen Reactions   Aspirin Shortness Of Breath   Nsaids Shortness Of Breath and Anaphylaxis    Throat swelling, shortness of breath   Sulfa Antibiotics Other (See Comments)    Stomach pain Stomach pain    Ceftriaxone Itching    Itching and redness near infusion site.    Codeine Other (See Comments)    Short of breath    Ciprofloxacin Hives   Penicillins     Has patient had a PCN reaction causing immediate rash, facial/tongue/throat swelling, SOB or lightheadedness with hypotension: Unknown Has patient had a PCN reaction causing severe rash involving mucus membranes or skin necrosis: Unknown Has patient had a PCN reaction that required hospitalization: Unknown Has patient had a PCN reaction occurring within the  last 10 years: Unknown If all of the above answers are "NO", then may proceed with Cephalosporin use.  Other reaction(s): Angioedema (ALLERGY/intolerance) Hives, swelling   Phenol     Other reaction(s): Angioedema (ALLERGY/intolerance), Other (See Comments) Spasms, nerve symptoms Spasms, nerve symptoms  Other reaction(s): Angioedema (ALLERGY/intolerance) Spasms, nerve symptoms   Promethazine Hcl     Family History  Problem Relation Age of Onset   COPD Sister     Prior to Admission medications   Medication Sig Start Date End Date Taking? Authorizing Provider  acetaminophen (TYLENOL) 650 MG CR tablet Take 650 mg by mouth every 8 (eight) hours as needed for pain.    [provider]  albuterol (PROVENTIL) (2.5 MG/3ML) 0.083% nebulizer solution Take 3 mLs (2.5 mg total) by nebulization every 3 (three) hours as needed for wheezing or shortness of breath. 09/21/18   Epifanio Lesches, MD  amLODipine (NORVASC) 10 MG tablet Take 1 tablet (10 mg total) by mouth daily. 07/19/19   Ivor Costa, MD  cyanocobalamin (,VITAMIN B-12,) 1000 MCG/ML injection Inject 1,000 mcg into the skin every 30 (thirty) days.     [provider]  Fluticasone-Salmeterol (ADVAIR) 500-50 MCG/DOSE AEPB Inhale 1 puff into the lungs 2 (two) times daily.    [provider]  guaiFENesin (MUCINEX) 600 MG 12 hr tablet Take 1 tablet (600 mg total) by mouth 2 (two) times daily. 07/19/19   Ivor Costa, MD  metoprolol tartrate (LOPRESSOR) 25  MG tablet Take 1 tablet (25 mg total) by mouth 2 (two) times daily. 08/20/17   Gladstone Lighter, MD  polyethylene glycol (MIRALAX / GLYCOLAX) packet Take 17 g by mouth daily as needed.    [provider]  tiotropium (SPIRIVA HANDIHALER) 18 MCG inhalation capsule Place 1 capsule (18 mcg total) into inhaler and inhale daily. 02/23/18 07/17/19  Henreitta Leber, MD  traMADol (ULTRAM) 50 MG tablet Take 1 tablet (50 mg total) by mouth 2 (two) times daily. 09/22/18    Epifanio Lesches, MD    Physical Exam: Vitals:   05/12/22 1300 05/12/22 1315 05/12/22 1330 05/12/22 1338  BP: (!) 218/94  (!) 205/119   Pulse: 86 85 90   Resp: 14 18    Temp:    98.1 F (36.7 C)  TempSrc:    Oral  SpO2:      Weight:      Height:       Physical Examination: General appearance - normal appearing weight and chronically ill appearing Neck - supple, no significant adenopathy Chest - clear to auscultation, no wheezes, rales or rhonchi, symmetric air entry Heart - normal rate, regular rhythm, normal S1, S2, no murmurs, rubs, clicks or gallops Abdomen - soft, nontender, nondistended, no masses or organomegaly Extremities - holding left leg Skin - nothing acutely noticeable.  Data Reviewed: Results for orders placed or performed during the hospital encounter of 05/12/22 (from the past 24 hour(s))  Basic metabolic panel     Status: Abnormal   Collection Time: 05/12/22  9:30 AM  Result Value Ref Range   Sodium 140 135 - 145 mmol/L   Potassium 4.0 3.5 - 5.1 mmol/L   Chloride 108 98 - 111 mmol/L   CO2 28 22 - 32 mmol/L   Glucose, Bld 99 70 - 99 mg/dL   BUN 11 8 - 23 mg/dL   Creatinine, Ser 0.56 0.44 - 1.00 mg/dL   Calcium 8.6 (L) 8.9 - 10.3 mg/dL   GFR, Estimated >60 >60 mL/min   Anion gap 4 (L) 5 - 15  CBC with Differential     Status: Abnormal   Collection Time: 05/12/22  9:30 AM  Result Value Ref Range   WBC 6.6 4.0 - 10.5 K/uL   RBC 4.22 3.87 - 5.11 MIL/uL   Hemoglobin 11.7 (L) 12.0 - 15.0 g/dL   HCT 37.7 36.0 - 46.0 %   MCV 89.3 80.0 - 100.0 fL   MCH 27.7 26.0 - 34.0 pg   MCHC 31.0 30.0 - 36.0 g/dL   RDW 14.6 11.5 - 15.5 %   Platelets 225 150 - 400 K/uL   nRBC 0.0 0.0 - 0.2 %   Neutrophils Relative % 61 %   Neutro Abs 4.1 1.7 - 7.7 K/uL   Lymphocytes Relative 25 %   Lymphs Abs 1.6 0.7 - 4.0 K/uL   Monocytes Relative 7 %   Monocytes Absolute 0.5 0.1 - 1.0 K/uL   Eosinophils Relative 6 %   Eosinophils Absolute 0.4 0.0 - 0.5 K/uL   Basophils  Relative 1 %   Basophils Absolute 0.1 0.0 - 0.1 K/uL   Immature Granulocytes 0 %   Abs Immature Granulocytes 0.02 0.00 - 0.07 K/uL   CT FEMUR LEFT WO CONTRAST  Result Date: 05/12/2022 CLINICAL DATA:  Leg pain EXAM: CT OF THE LOWER LEFT EXTREMITY WITHOUT CONTRAST TECHNIQUE: Multidetector CT imaging of the lower left extremity was performed according to the standard protocol. RADIATION DOSE REDUCTION: This exam was performed according  to the departmental dose-optimization program which includes automated exposure control, adjustment of the mA and/or kV according to patient size and/or use of iterative reconstruction technique. COMPARISON:  None Available. FINDINGS: Bones/Joint/Cartilage Diffuse osteopenia. There is an obliquely oriented nondisplaced fracture of the distal femoral diaphysis. 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 There is moderate medial and patellofemoral compartment osteoarthritis. There is no evidence of fracture extension to the articular/chondral surface of the knee. There is a trace left knee joint effusion. There is no evidence of acute left hip fracture or fracture in the visualized pelvis. Ligaments Suboptimally assessed by CT. Muscles and Tendons No acute myotendinous abnormality by CT. No intramuscular collection. Soft tissues No focal fluid collection. Atherosclerotic disease of the lower extremity vasculature. Distension of the urinary bladder. Sigmoid diverticulosis. Moderate rectal stool burden. IMPRESSION: Obliquely oriented nondisplaced fracture of the distal femoral diaphysis. No evidence of acute left hip fracture or fracture in the visualized pelvis. 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 Electronically Signed   By: Maurine Simmering M.D.   On: 05/12/2022  12:43   CT Head Wo Contrast  Addendum Date: 05/12/2022   ADDENDUM REPORT: 05/12/2022 10:59 ADDENDUM: As noted in the body of the report, there is mucosal thickening in the paranasal sinuses with tiny air-fluid levels noted in both maxillary sinuses. These imaging features can be seen in the setting of acute on chronic paranasal sinusitis. Electronically Signed   By: Misty Stanley M.D.   On: 05/12/2022 10:59   Result Date: 05/12/2022 CLINICAL DATA:  Status post fall. EXAM: CT HEAD WITHOUT CONTRAST CT CERVICAL SPINE WITHOUT CONTRAST TECHNIQUE: Multidetector CT imaging of the head and cervical spine was performed following the standard protocol without intravenous contrast. Multiplanar CT image reconstructions of the cervical spine were also generated. RADIATION DOSE REDUCTION: This exam was performed according to the departmental dose-optimization program which includes automated exposure control, adjustment of the mA and/or kV according to patient size and/or use of iterative reconstruction technique. COMPARISON:  His CT 05/25/2019. FINDINGS: CT HEAD FINDINGS Brain: There is no evidence for acute hemorrhage, hydrocephalus, or abnormal extra-axial fluid collection. 13 x 13 x 13 mm calcified extra-axial para falcine lesion is identified in the high frontal parietal region (axial 29/3 and coronal 36/4), stable since prior. No evidence for mass effect or adjacent edema. Old right parietooccipital lobe infarct evident, new since prior study. Age indeterminate low-density in the left occipital lobe is compatible with age indeterminate infarct, also new since prior. Diffuse loss of parenchymal volume is consistent with atrophy. Patchy low attenuation in the deep hemispheric and periventricular white matter is nonspecific, but likely reflects chronic microvascular ischemic demyelination. Vascular: No hyperdense vessel or unexpected calcification. Skull: No evidence for fracture. No worrisome lytic or sclerotic lesion.  Sinuses/Orbits: Mucosal thickening noted in the maxillary sinuses bilaterally with associated tiny air-fluid levels. Scattered opacification of ethmoid air cells is associated with mucosal thickening in both frontal and sphenoid sinuses. No mastoid effusion. Other: None. CT CERVICAL SPINE FINDINGS Alignment: Straightening of normal cervical lordosis. Trace anterolisthesis of C3 on 4 is compatible with the facet degeneration at this level. Skull base and vertebrae: No acute fracture. No primary bone lesion or focal pathologic process. Soft tissues and spinal canal: No prevertebral fluid or swelling. No visible canal hematoma. Bilateral thyroid nodules evident measuring up to 2.2 cm on the  left. Disc levels: Marked loss of disc height is seen at all levels from C3-4 C6-7. Upper chest: 7 mm right apical nodule visible on 69/2. This is stable since 04/22/2018 consistent with benign etiology. No followup recommended. Other: None. IMPRESSION: 1. Age indeterminate low-density in the left occipital lobe is new since the prior study and compatible with age indeterminate infarct. MRI could be used to further evaluate as clinically indicated. 2. Right parietooccipital lobe infarct appears chronic but is new since prior study. 3. 13 x 13 x 13 mm calcified extra-axial para falcine lesion towards the vertex, stable since prior and likely a meningioma. No mass effect or adjacent edema. 4. No cervical spine fracture or subluxation. 5. Advanced degenerative changes in the cervical spine. 6. Bilateral thyroid nodules measuring up to 2.2 cm on the left. Recommend thyroid US (ref: J Am Coll Radiol. 2015 Feb;12(2): 143-50). Electronically Signed: By: Misty Stanley M.D. On: 05/12/2022 10:27   CT Cervical Spine Wo Contrast  Addendum Date: 05/12/2022   ADDENDUM REPORT: 05/12/2022 10:59 ADDENDUM: As noted in the body of the report, there is mucosal thickening in the paranasal sinuses with tiny air-fluid levels noted in both maxillary  sinuses. These imaging features can be seen in the setting of acute on chronic paranasal sinusitis. Electronically Signed   By: Misty Stanley M.D.   On: 05/12/2022 10:59   Result Date: 05/12/2022 CLINICAL DATA:  Status post fall. EXAM: CT HEAD WITHOUT CONTRAST CT CERVICAL SPINE WITHOUT CONTRAST TECHNIQUE: Multidetector CT imaging of the head and cervical spine was performed following the standard protocol without intravenous contrast. Multiplanar CT image reconstructions of the cervical spine were also generated. RADIATION DOSE REDUCTION: This exam was performed according to the departmental dose-optimization program which includes automated exposure control, adjustment of the mA and/or kV according to patient size and/or use of iterative reconstruction technique. COMPARISON:  His CT 05/25/2019. FINDINGS: CT HEAD FINDINGS Brain: There is no evidence for acute hemorrhage, hydrocephalus, or abnormal extra-axial fluid collection. 13 x 13 x 13 mm calcified extra-axial para falcine lesion is identified in the high frontal parietal region (axial 29/3 and coronal 36/4), stable since prior. No evidence for mass effect or adjacent edema. Old right parietooccipital lobe infarct evident, new since prior study. Age indeterminate low-density in the left occipital lobe is compatible with age indeterminate infarct, also new since prior. Diffuse loss of parenchymal volume is consistent with atrophy. Patchy low attenuation in the deep hemispheric and periventricular white matter is nonspecific, but likely reflects chronic microvascular ischemic demyelination. Vascular: No hyperdense vessel or unexpected calcification. Skull: No evidence for fracture. No worrisome lytic or sclerotic lesion. Sinuses/Orbits: Mucosal thickening noted in the maxillary sinuses bilaterally with associated tiny air-fluid levels. Scattered opacification of ethmoid air cells is associated with mucosal thickening in both frontal and sphenoid sinuses. No  mastoid effusion. Other: None. CT CERVICAL SPINE FINDINGS Alignment: Straightening of normal cervical lordosis. Trace anterolisthesis of C3 on 4 is compatible with the facet degeneration at this level. Skull base and vertebrae: No acute fracture. No primary bone lesion or focal pathologic process. Soft tissues and spinal canal: No prevertebral fluid or swelling. No visible canal hematoma. Bilateral thyroid nodules evident measuring up to 2.2 cm on the left. Disc levels: Marked loss of disc height is seen at all levels from C3-4 C6-7. Upper chest: 7 mm right apical nodule visible on 69/2. This is stable since 04/22/2018 consistent with benign etiology. No followup recommended. Other: None. IMPRESSION: 1. Age indeterminate low-density in  the left occipital lobe is new since the prior study and compatible with age indeterminate infarct. MRI could be used to further evaluate as clinically indicated. 2. Right parietooccipital lobe infarct appears chronic but is new since prior study. 3. 13 x 13 x 13 mm calcified extra-axial para falcine lesion towards the vertex, stable since prior and likely a meningioma. No mass effect or adjacent edema. 4. No cervical spine fracture or subluxation. 5. Advanced degenerative changes in the cervical spine. 6. Bilateral thyroid nodules measuring up to 2.2 cm on the left. Recommend thyroid US (ref: J Am Coll Radiol. 2015 Feb;12(2): 143-50). Electronically Signed: By: Misty Stanley M.D. On: 05/12/2022 10:27   DG Hip Unilat W or Wo Pelvis 2-3 Views Left  Result Date: 05/12/2022 CLINICAL DATA:  Pain after fall EXAM: DG HIP (WITH OR WITHOUT PELVIS) 2-3V LEFT COMPARISON:  None Available. FINDINGS: There is no evidence of hip fracture or dislocation. There is no evidence of arthropathy or other focal bone abnormality. IMPRESSION: Negative. Electronically Signed   By: Dorise Bullion III M.D.   On: 05/12/2022 10:26   DG Knee 2 Views Left  Result Date: 05/12/2022 CLINICAL DATA:  Pain after  fall EXAM: LEFT KNEE - 1-2 VIEW COMPARISON:  None Available. FINDINGS: A lucency crossing the distal femoral diaphysis is consistent with a nondisplaced fracture. No other fractures. No dislocations. No joint effusion. Vascular calcifications. IMPRESSION: A lucency crossing the distal femoral diaphysis is most consistent with a nondisplaced fracture only well seen on one view. Electronically Signed   By: Dorise Bullion III M.D.   On: 05/12/2022 10:25     Assessment and Plan: * Femur fracture, left ([place]) Reviewed case with ortho--non-operative, non-displaced fracture. Knee immobility PT consult Consider inpt. rehab  History of CVA (cerebrovascular accident) Found incidentally on CT scan, new since 2020 but not acute Reviewed with neurology, for Plavix only  Dementia without behavioral disturbance (HCC) Stable at baseline  Pernicious anemia On monthly B12 injections  Essential hypertension Continue Norvasc and Metolprolol Prn IV reduction  COPD (chronic obstructive pulmonary disease) (HCC) Continue Spiriva, Advair and Albuterol prn      Advance Care Planning:   Code Status: Prior DNR, per granddaughter after discussion with her father (pt's son--has POA and is on a trip to [place] right now)  Consults: Ortho - Lockie Mola, Neurology [employer]  Family Communication: Granddaughter at bedside.  Severity of Illness: The appropriate patient status for this patient is OBSERVATION. Observation status is judged to be reasonable and necessary in order to provide the required intensity of service to ensure the patient's safety. The patient's presenting symptoms, physical exam findings, and initial radiographic and laboratory data in the context of their medical condition is felt to place them at decreased risk for further clinical deterioration. Furthermore, it is anticipated that the patient will be medically stable for discharge from the hospital within 2 midnights of admission.    Author: Donnamae Jude, MD 05/12/2022 2:14 PM  For on call review [email or web address].

## 2022-05-12 NOTE — Assessment & Plan Note (Signed)
Stable at baseline 

## 2022-05-12 NOTE — Assessment & Plan Note (Signed)
Reviewed case with ortho--non-operative, non-displaced fracture. Knee immobility PT consult Consider inpt. rehab

## 2022-05-12 NOTE — ED Triage Notes (Signed)
Pt fell this morning and complains of L knee pain. Unknown if hit head. Pt does not remember falling. Pt denies thinners. Pt HOH.

## 2022-05-12 NOTE — Assessment & Plan Note (Addendum)
Found incidentally on CT scan, new since 2020 but not acute.  Admitting physician placed on Plavix.

## 2022-05-12 NOTE — Assessment & Plan Note (Signed)
Continue Norvasc and Metolprolol Prn IV reduction

## 2022-05-12 NOTE — ED Notes (Signed)
Bethany RN aware of assigned bed

## 2022-05-12 NOTE — Assessment & Plan Note (Signed)
On monthly B12 injections

## 2022-05-12 NOTE — TOC CM/SW Note (Addendum)
CSW acknowledges consult for rehab. Please place PT/OT orders when appropriate and TOC to follow up after their evaluations. Notified RN.  Oleh Genin, [place]

## 2022-05-12 NOTE — ED Provider Notes (Signed)
[facility] Provider Note    Event Date/Time   First MD Initiated Contact with Patient 05/12/22 [phone]     (approximate)   History   Fall   HPI  Janice Hoffman is a 88 y.o. female with history of COPD and dementia who presents after an apparent fall with left knee pain.  The patient is unable to give any history.  Per EMS, the caregiver that was present was unable to give much additional history.  It is unknown if the patient hit her head.  She is not on anticoagulation.    Physical Exam   Triage Vital Signs: ED Triage Vitals  Enc Vitals Group     BP 05/12/22 0927 (!) 165/102     Pulse Rate 05/12/22 0931 60     Resp 05/12/22 0930 18     Temp 05/12/22 0931 98.4 F (36.9 C)     Temp Source 05/12/22 0931 Oral     SpO2 05/12/22 0931 95 %     Weight 05/12/22 0926 119 lb 4.3 oz (54.1 kg)     Height 05/12/22 0926 '5\' 4"'$  (1.626 m)     Head Circumference --      Peak Flow --      Pain Score 05/12/22 0925 4     Pain Loc --      Pain Edu? --      Excl. in [place]? --     Most recent vital signs: Vitals:   05/12/22 1330 05/12/22 1338  BP: (!) 205/119   Pulse: 90   Resp:    Temp:  98.1 F (36.7 C)  SpO2:       General: Alert, Oriented x1, comfortable appearing. CV:  Good peripheral perfusion.  Resp:  Normal effort.  Abd:  No distention.  Other:  EOMI.  PERRLA.  Motor intact in all extremities.  No midline spinal tenderness.  Pain on range of motion of left knee with no obvious deformity.  2+ DP pulse.   ED Results / Procedures / Treatments   Labs (all labs ordered are listed, but only abnormal results are displayed) Labs Reviewed  BASIC METABOLIC PANEL - Abnormal; Notable for the following components:      Result Value   Calcium 8.6 (*)    Anion gap 4 (*)    All other components within normal limits  CBC WITH DIFFERENTIAL/PLATELET - Abnormal; Notable for the following components:   Hemoglobin 11.7 (*)    All other components within normal  limits     EKG     RADIOLOGY  XR L knee: I independently viewed and interpreted the images; possible distal femur fracture  XR L hip: No acute fracture  CT head: Age-indeterminate left occipital infarct CT cervical spine: No acute fracture  PROCEDURES:  Critical Care performed: No  Procedures   MEDICATIONS ORDERED IN ED: Medications  clopidogrel (PLAVIX) tablet 75 mg (75 mg Oral Given 05/12/22 1237)  fentaNYL (SUBLIMAZE) injection 50 mcg (50 mcg Intravenous Given 05/12/22 1244)     IMPRESSION / MDM / ASSESSMENT AND PLAN / ED COURSE  I reviewed the triage vital signs and the nursing notes.  88 year old female with PMH as noted above presents with left knee pain after an apparent fall although she has dementia and is unable to give any other history.  There is no family member present at this time.  On exam she is hypertensive with otherwise normal vital signs.  She is able to range the  left knee with pain.  There is no deformity.  The extremity is neuro/vascular intact.  Neurologic exam is nonfocal.  Differential diagnosis includes, but is not limited to, knee contusion, fracture, hip fracture contusion, strain or sprain.  Given that the patient cannot give history we will also evaluate for head injury.  Patient's presentation is most consistent with acute presentation with potential threat to life or bodily function.  I have ordered CT head and cervical spine, x-rays of the left knee and hip, and basic labs.  The patient is on the cardiac monitor to evaluate for evidence of arrhythmia and/or significant heart rate changes.  ----------------------------------------- 1:37 PM on 05/12/2022 -----------------------------------------  Lab work-up is unremarkable.  Electrolytes are normal.  There is no leukocytosis.  X-rays show a possible distal femur fracture so I proceeded with a CT which confirms nondisplaced distal femur fracture.  I consulted Dr. Sharlet Salina from  orthopedics and discussed the case with him.  He recommends a knee immobilizer and advised that the patient can be toe-touch weightbearing with a walker, and that surgery is not indicated at this time.  She will need orthopedic follow-up.  I then discussed the situation further with the patient's son who is out of town as well as with the caregiver who is with her.  They are concerned that given her already limited mobility with the walker, that she will essentially be bedbound if she needs to use the knee immobilizer and has pain.  She will therefore need PT eval and possible rehab placement.  Given the acute fracture and for pain control as well, I consulted Dr. Kennon Rounds from the hospitalist service; based on our discussion she agrees to admit the patient.  I also consulted Dr. Quinn Axe from neurology based on the CT read of age-indeterminate occipital infarct.  The patient has no acute stroke symptoms.  Dr. Quinn Axe agrees that this appears subacute to chronic and does not require acute intervention although she does recommend starting the patient on Plavix.    FINAL CLINICAL IMPRESSION(S) / ED DIAGNOSES   Final diagnoses:  Closed fracture of distal end of left femur, unspecified fracture morphology, initial encounter ([place])     Rx / DC Orders   ED Discharge Orders     None        Note:  This document was prepared using Dragon voice recognition software and may include unintentional dictation errors.    Arta Silence, MD 05/12/22 1343

## 2022-05-12 NOTE — Assessment & Plan Note (Signed)
Continue Spiriva, Advair and Albuterol prn

## 2022-05-12 NOTE — Progress Notes (Signed)
Patient admitted from ER due to Left non-operative, non-displaced femur fracture due to Fall at home overnight for PT consult and pain management. Patient hypertensive at this time, otherwise VSS. Granddaughter at bedside. Needs and call bell at reach. Bed locked and to lowest position. Will continue to monitor and assess with plan of care.

## 2022-05-13 ENCOUNTER — Inpatient Hospital Stay: Payer: PPO

## 2022-05-13 DIAGNOSIS — R062 Wheezing: Secondary | ICD-10-CM | POA: Diagnosis not present

## 2022-05-13 DIAGNOSIS — J690 Pneumonitis due to inhalation of food and vomit: Secondary | ICD-10-CM

## 2022-05-13 DIAGNOSIS — S72402S Unspecified fracture of lower end of left femur, sequela: Secondary | ICD-10-CM | POA: Diagnosis not present

## 2022-05-13 DIAGNOSIS — F0390 Unspecified dementia without behavioral disturbance: Secondary | ICD-10-CM

## 2022-05-13 DIAGNOSIS — Z8673 Personal history of transient ischemic attack (TIA), and cerebral infarction without residual deficits: Secondary | ICD-10-CM

## 2022-05-13 DIAGNOSIS — J449 Chronic obstructive pulmonary disease, unspecified: Secondary | ICD-10-CM

## 2022-05-13 DIAGNOSIS — S72442D Displaced fracture of lower epiphysis (separation) of left femur, subsequent encounter for closed fracture with routine healing: Secondary | ICD-10-CM

## 2022-05-13 DIAGNOSIS — D510 Vitamin B12 deficiency anemia due to intrinsic factor deficiency: Secondary | ICD-10-CM

## 2022-05-13 DIAGNOSIS — I10 Essential (primary) hypertension: Secondary | ICD-10-CM

## 2022-05-13 HISTORY — DX: Displaced fracture of lower epiphysis (separation) of left femur, subsequent encounter for closed fracture with routine healing: S72.442D

## 2022-05-13 MED ORDER — MORPHINE SULFATE (PF) 2 MG/ML IV SOLN: 1.0000 mg | INTRAVENOUS | Status: DC | PRN

## 2022-05-13 MED ORDER — ACETAMINOPHEN 500 MG PO TABS
1000.0000 mg | ORAL_TABLET | Freq: Four times a day (QID) | ORAL | Status: DC | PRN
  Administered 2022-05-14 (×2): 1000 mg via ORAL
  Filled 2022-05-13 (×3): qty 2

## 2022-05-13 MED ORDER — ACETAMINOPHEN 500 MG PO TABS: 1000.0000 mg | ORAL_TABLET | Freq: Four times a day (QID) | ORAL | Status: DC | PRN

## 2022-05-13 MED ORDER — IPRATROPIUM-ALBUTEROL 0.5-2.5 (3) MG/3ML IN SOLN
3.0000 mL | Freq: Four times a day (QID) | RESPIRATORY_TRACT | Status: DC
  Administered 2022-05-13 – 2022-05-14 (×5): 3 mL via RESPIRATORY_TRACT
  Filled 2022-05-13 (×5): qty 3

## 2022-05-13 MED ORDER — BUDESONIDE 0.25 MG/2ML IN SUSP
0.2500 mg | Freq: Two times a day (BID) | RESPIRATORY_TRACT | Status: DC
  Administered 2022-05-13 – 2022-05-18 (×11): 0.25 mg via RESPIRATORY_TRACT
  Filled 2022-05-13 (×11): qty 2

## 2022-05-13 NOTE — Progress Notes (Signed)
PT Cancellation Note  Patient Details Name: Janice Hoffman MRN: [id number] DOB: May 23, 1926   Cancelled Treatment:    Reason Eval/Treat Not Completed: Other (comment) (chart reviewed, evaluation attempted; pt working with [name] to take medications;) Upon 2nd attempt, pt awake calm, partially interactive but with what appears to be heavy language deficits. Pt no responding to any verbal language in a meaningful manner. Pt attempts verbal expression, author unable to make any sense of it. Family not at bedside to facilitate pt's baseline motor tendencies and assistance needs. WIll attempt evaluation again at later date/time. Author did have a few minutes to meet with family at first attempt and exchange some pieces of important information.   4:16 PM, 05/13/22 Etta Grandchild, PT, DPT Physical Therapist - [facility]  [phone] ([place])      Swift C 05/13/2022, 4:14 PM

## 2022-05-13 NOTE — Consult Note (Signed)
ORTHOPAEDIC CONSULTATION  REQUESTING PHYSICIAN: Loletha Grayer, MD  Chief Complaint: Left knee pain  HPI: Janice Hoffman is a 88 y.o. female who complains of left knee pain after mechanical fall.  Patient typically ambulates with a walker however was not using her walker when she fell.  X-rays and CT scan were performed in the ER which shows presence of a nondisplaced distal femur fracture.   The patient lives at home with family.  Her son is currently out of the country in [place].  She is accompanied by her caretaker.  Patient does have a history of a tibial plateau fracture treated nonoperatively, which required stay at a SNF.  Past medical history is notable for COPD, history of CVA, dementia.  Past Medical History:  Diagnosis Date   Asthma    COPD (chronic obstructive pulmonary disease) ([place])    Herpes zoster    Hypertension    Neuralgia    Past Surgical History:  Procedure Laterality Date   LEG SURGERY     Social History   Socioeconomic History   Marital status: Widowed    Spouse name: Not on file   Number of children: Not on file   Years of education: Not on file   Highest education level: Not on file  Occupational History   Occupation: retired  Tobacco Use   Smoking status: Never   Smokeless tobacco: Never  Vaping Use   Vaping Use: Never used  Substance and Sexual Activity   Alcohol use: No    Alcohol/week: 0.0 standard drinks of alcohol   Drug use: No   Sexual activity: Not Currently  Other Topics Concern   Not on file  Social History Narrative   Not on file   Social Determinants of Health   Financial Resource Strain: Unknown (07/19/2018)   Overall Financial Resource Strain (CARDIA)    Difficulty of Paying Living Expenses: Patient refused  Food Insecurity: Unknown (07/19/2018)   Hunger Vital Sign    Worried About [place] in the Last Year: Patient refused    [place] in the Last Year: Patient refused  Transportation Needs:  Unknown (07/19/2018)   [place] - Hydrologist (Medical): Patient refused    Lack of Transportation (Non-Medical): Patient refused  Physical Activity: Unknown (07/19/2018)   Exercise Vital Sign    Days of Exercise per Week: Patient refused    Minutes of Exercise per Session: Patient refused  Stress: Not on file  Social Connections: Unknown (07/19/2018)   Social Connection and Isolation Panel [NHANES]    Frequency of Communication with Friends and Family: Patient refused    Frequency of Social Gatherings with Friends and Family: Patient refused    Attends Religious Services: Patient refused    Marine scientist or Organizations: Patient refused    Attends Music therapist: Patient refused    Marital Status: Patient refused   Family History  Problem Relation Age of Onset   COPD Sister    Allergies  Allergen Reactions   Aspirin Shortness Of Breath   Nsaids Shortness Of Breath and Anaphylaxis    Throat swelling, shortness of breath   Sulfa Antibiotics Other (See Comments)    Stomach pain Stomach pain    Ceftriaxone Itching    Itching and redness near infusion site.    Codeine Other (See Comments)    Short of breath    Ciprofloxacin Hives   Penicillins     Has  patient had a PCN reaction causing immediate rash, facial/tongue/throat swelling, SOB or lightheadedness with hypotension: Unknown Has patient had a PCN reaction causing severe rash involving mucus membranes or skin necrosis: Unknown Has patient had a PCN reaction that required hospitalization: Unknown Has patient had a PCN reaction occurring within the last 10 years: Unknown If all of the above answers are "NO", then may proceed with Cephalosporin use.  Other reaction(s): Angioedema (ALLERGY/intolerance) Hives, swelling   Phenol     Other reaction(s): Angioedema (ALLERGY/intolerance), Other (See Comments) Spasms, nerve symptoms Spasms, nerve symptoms  Other reaction(s):  Angioedema (ALLERGY/intolerance) Spasms, nerve symptoms   Promethazine Hcl    Prior to Admission medications   Medication Sig Start Date End Date Taking? Authorizing Provider  albuterol (VENTOLIN HFA) 108 (90 Base) MCG/ACT inhaler Inhale 1-2 puffs into the lungs every 6 (six) hours as needed for wheezing or shortness of breath.   Yes [provider]  Fluticasone-Salmeterol (ADVAIR) 500-50 MCG/DOSE AEPB Inhale 1 puff into the lungs 2 (two) times daily.   Yes [provider]  ipratropium-albuterol (DUONEB) 0.5-2.5 (3) MG/3ML SOLN Inhale 3 mLs into the lungs in the morning, at noon, in the evening, and at bedtime. 12/05/20  Yes [provider]  metoprolol tartrate (LOPRESSOR) 25 MG tablet Take 1 tablet (25 mg total) by mouth 2 (two) times daily. 08/20/17  Yes Gladstone Lighter, MD  acetaminophen (TYLENOL) 650 MG CR tablet Take 650 mg by mouth every 8 (eight) hours as needed for pain.    [provider]  albuterol (PROVENTIL) (2.5 MG/3ML) 0.083% nebulizer solution Take 3 mLs (2.5 mg total) by nebulization every 3 (three) hours as needed for wheezing or shortness of breath. Patient not taking: Reported on 05/12/2022 09/21/18   Epifanio Lesches, MD  amLODipine (NORVASC) 10 MG tablet Take 1 tablet (10 mg total) by mouth daily. Patient not taking: Reported on 05/12/2022 07/19/19   Ivor Costa, MD  cyanocobalamin (,VITAMIN B-12,) 1000 MCG/ML injection Inject 1,000 mcg into the skin every 30 (thirty) days.     [provider]  guaiFENesin (MUCINEX) 600 MG 12 hr tablet Take 1 tablet (600 mg total) by mouth 2 (two) times daily. 07/19/19   Ivor Costa, MD  polyethylene glycol [facility] / Floria Raveling) packet Take 17 g by mouth daily as needed.    [provider]  tiotropium (SPIRIVA HANDIHALER) 18 MCG inhalation capsule Place 1 capsule (18 mcg total) into inhaler and inhale daily. Patient not taking: Reported on 05/12/2022 02/23/18 07/17/19  Henreitta Leber, MD   traMADol (ULTRAM) 50 MG tablet Take 1 tablet (50 mg total) by mouth 2 (two) times daily. Patient not taking: Reported on 05/12/2022 09/22/18   Epifanio Lesches, MD   CT FEMUR LEFT WO CONTRAST  Result Date: 05/12/2022 CLINICAL DATA:  Leg pain EXAM: CT OF THE LOWER LEFT EXTREMITY WITHOUT CONTRAST TECHNIQUE: Multidetector CT imaging of the lower left extremity was performed according to the standard protocol. RADIATION DOSE REDUCTION: This exam was performed according to the departmental dose-optimization program which includes automated exposure control, adjustment of the mA and/or kV according to patient size and/or use of iterative reconstruction technique. COMPARISON:  None Available. FINDINGS: Bones/Joint/Cartilage Diffuse osteopenia. There is an obliquely oriented nondisplaced fracture of the distal femoral diaphysis. 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 There is moderate medial and patellofemoral compartment osteoarthritis. There is no evidence of fracture extension to the articular/chondral  surface of the knee. There is a trace left knee joint effusion. There is no evidence of acute left hip fracture or fracture in the visualized pelvis. Ligaments Suboptimally assessed by CT. Muscles and Tendons No acute myotendinous abnormality by CT. No intramuscular collection. Soft tissues No focal fluid collection. Atherosclerotic disease of the lower extremity vasculature. Distension of the urinary bladder. Sigmoid diverticulosis. Moderate rectal stool burden. IMPRESSION: Obliquely oriented nondisplaced fracture of the distal femoral diaphysis. No evidence of acute left hip fracture or fracture in the visualized pelvis. 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 Electronically Signed   By:  Maurine Simmering M.D.   On: 05/12/2022 12:43   CT Head Wo Contrast  Addendum Date: 05/12/2022   ADDENDUM REPORT: 05/12/2022 10:59 ADDENDUM: As noted in the body of the report, there is mucosal thickening in the paranasal sinuses with tiny air-fluid levels noted in both maxillary sinuses. These imaging features can be seen in the setting of acute on chronic paranasal sinusitis. Electronically Signed   By: Misty Stanley M.D.   On: 05/12/2022 10:59   Result Date: 05/12/2022 CLINICAL DATA:  Status post fall. EXAM: CT HEAD WITHOUT CONTRAST CT CERVICAL SPINE WITHOUT CONTRAST TECHNIQUE: Multidetector CT imaging of the head and cervical spine was performed following the standard protocol without intravenous contrast. Multiplanar CT image reconstructions of the cervical spine were also generated. RADIATION DOSE REDUCTION: This exam was performed according to the departmental dose-optimization program which includes automated exposure control, adjustment of the mA and/or kV according to patient size and/or use of iterative reconstruction technique. COMPARISON:  His CT 05/25/2019. FINDINGS: CT HEAD FINDINGS Brain: There is no evidence for acute hemorrhage, hydrocephalus, or abnormal extra-axial fluid collection. 13 x 13 x 13 mm calcified extra-axial para falcine lesion is identified in the high frontal parietal region (axial 29/3 and coronal 36/4), stable since prior. No evidence for mass effect or adjacent edema. Old right parietooccipital lobe infarct evident, new since prior study. Age indeterminate low-density in the left occipital lobe is compatible with age indeterminate infarct, also new since prior. Diffuse loss of parenchymal volume is consistent with atrophy. Patchy low attenuation in the deep hemispheric and periventricular white matter is nonspecific, but likely reflects chronic microvascular ischemic demyelination. Vascular: No hyperdense vessel or unexpected calcification. Skull: No evidence for fracture. No  worrisome lytic or sclerotic lesion. Sinuses/Orbits: Mucosal thickening noted in the maxillary sinuses bilaterally with associated tiny air-fluid levels. Scattered opacification of ethmoid air cells is associated with mucosal thickening in both frontal and sphenoid sinuses. No mastoid effusion. Other: None. CT CERVICAL SPINE FINDINGS Alignment: Straightening of normal cervical lordosis. Trace anterolisthesis of C3 on 4 is compatible with the facet degeneration at this level. Skull base and vertebrae: No acute fracture. No primary bone lesion or focal pathologic process. Soft tissues and spinal canal: No prevertebral fluid or swelling. No visible canal hematoma. Bilateral thyroid nodules evident measuring up to 2.2 cm on the left. Disc levels: Marked loss of disc height is seen at all levels from C3-4 C6-7. Upper chest: 7 mm right apical nodule visible on 69/2. This is stable since 04/22/2018 consistent with benign etiology. No followup recommended. Other: None. IMPRESSION: 1. Age indeterminate low-density in the left occipital lobe is new since the prior study and compatible with age indeterminate infarct. MRI could be used to further evaluate as clinically indicated. 2. Right parietooccipital lobe infarct appears chronic but is new since prior study. 3. 13  x 13 x 13 mm calcified extra-axial para falcine lesion towards the vertex, stable since prior and likely a meningioma. No mass effect or adjacent edema. 4. No cervical spine fracture or subluxation. 5. Advanced degenerative changes in the cervical spine. 6. Bilateral thyroid nodules measuring up to 2.2 cm on the left. Recommend thyroid US (ref: J Am Coll Radiol. 2015 Feb;12(2): 143-50). Electronically Signed: By: Misty Stanley M.D. On: 05/12/2022 10:27   CT Cervical Spine Wo Contrast  Addendum Date: 05/12/2022   ADDENDUM REPORT: 05/12/2022 10:59 ADDENDUM: As noted in the body of the report, there is mucosal thickening in the paranasal sinuses with tiny  air-fluid levels noted in both maxillary sinuses. These imaging features can be seen in the setting of acute on chronic paranasal sinusitis. Electronically Signed   By: Misty Stanley M.D.   On: 05/12/2022 10:59   Result Date: 05/12/2022 CLINICAL DATA:  Status post fall. EXAM: CT HEAD WITHOUT CONTRAST CT CERVICAL SPINE WITHOUT CONTRAST TECHNIQUE: Multidetector CT imaging of the head and cervical spine was performed following the standard protocol without intravenous contrast. Multiplanar CT image reconstructions of the cervical spine were also generated. RADIATION DOSE REDUCTION: This exam was performed according to the departmental dose-optimization program which includes automated exposure control, adjustment of the mA and/or kV according to patient size and/or use of iterative reconstruction technique. COMPARISON:  His CT 05/25/2019. FINDINGS: CT HEAD FINDINGS Brain: There is no evidence for acute hemorrhage, hydrocephalus, or abnormal extra-axial fluid collection. 13 x 13 x 13 mm calcified extra-axial para falcine lesion is identified in the high frontal parietal region (axial 29/3 and coronal 36/4), stable since prior. No evidence for mass effect or adjacent edema. Old right parietooccipital lobe infarct evident, new since prior study. Age indeterminate low-density in the left occipital lobe is compatible with age indeterminate infarct, also new since prior. Diffuse loss of parenchymal volume is consistent with atrophy. Patchy low attenuation in the deep hemispheric and periventricular white matter is nonspecific, but likely reflects chronic microvascular ischemic demyelination. Vascular: No hyperdense vessel or unexpected calcification. Skull: No evidence for fracture. No worrisome lytic or sclerotic lesion. Sinuses/Orbits: Mucosal thickening noted in the maxillary sinuses bilaterally with associated tiny air-fluid levels. Scattered opacification of ethmoid air cells is associated with mucosal thickening in  both frontal and sphenoid sinuses. No mastoid effusion. Other: None. CT CERVICAL SPINE FINDINGS Alignment: Straightening of normal cervical lordosis. Trace anterolisthesis of C3 on 4 is compatible with the facet degeneration at this level. Skull base and vertebrae: No acute fracture. No primary bone lesion or focal pathologic process. Soft tissues and spinal canal: No prevertebral fluid or swelling. No visible canal hematoma. Bilateral thyroid nodules evident measuring up to 2.2 cm on the left. Disc levels: Marked loss of disc height is seen at all levels from C3-4 C6-7. Upper chest: 7 mm right apical nodule visible on 69/2. This is stable since 04/22/2018 consistent with benign etiology. No followup recommended. Other: None. IMPRESSION: 1. Age indeterminate low-density in the left occipital lobe is new since the prior study and compatible with age indeterminate infarct. MRI could be used to further evaluate as clinically indicated. 2. Right parietooccipital lobe infarct appears chronic but is new since prior study. 3. 13 x 13 x 13 mm calcified extra-axial para falcine lesion towards the vertex, stable since prior and likely a meningioma. No mass effect or adjacent edema. 4. No cervical spine fracture or subluxation. 5. Advanced degenerative changes in the cervical spine. 6. Bilateral thyroid nodules measuring  up to 2.2 cm on the left. Recommend thyroid US (ref: J Am Coll Radiol. 2015 Feb;12(2): 143-50). Electronically Signed: By: Misty Stanley M.D. On: 05/12/2022 10:27   DG Hip Unilat W or Wo Pelvis 2-3 Views Left  Result Date: 05/12/2022 CLINICAL DATA:  Pain after fall EXAM: DG HIP (WITH OR WITHOUT PELVIS) 2-3V LEFT COMPARISON:  None Available. FINDINGS: There is no evidence of hip fracture or dislocation. There is no evidence of arthropathy or other focal bone abnormality. IMPRESSION: Negative. Electronically Signed   By: Dorise Bullion III M.D.   On: 05/12/2022 10:26   DG Knee 2 Views Left  Result Date:  05/12/2022 CLINICAL DATA:  Pain after fall EXAM: LEFT KNEE - 1-2 VIEW COMPARISON:  None Available. FINDINGS: A lucency crossing the distal femoral diaphysis is consistent with a nondisplaced fracture. No other fractures. No dislocations. No joint effusion. Vascular calcifications. IMPRESSION: A lucency crossing the distal femoral diaphysis is most consistent with a nondisplaced fracture only well seen on one view. Electronically Signed   By: Dorise Bullion III M.D.   On: 05/12/2022 10:25    Positive ROS: All other systems have been reviewed and were otherwise negative with the exception of those mentioned in the HPI and as above.  Physical Exam: General: Alert, no acute distress Cardiovascular: No pedal edema Respiratory: No cyanosis, no use of accessory musculature GI: No organomegaly, abdomen is soft and non-tender Skin: No lesions in the area of chief complaint Neurologic: Sensation intact distally Psychiatric: Patient is competent for consent with normal mood and affect Lymphatic: No axillary or cervical lymphadenopathy  MUSCULOSKELETAL:  Left lower extremity: Knee immobilizer in place, which was removed, range of motion of the knee deferred, no significant knee effusion, motor and sensory intact   Assessment: 88 year old female admitted with a nondisplaced, oblique fracture of the left distal femoral diaphysis.  Plan: I had a long discussion with the patient's caretaker regarding the nature of the patient's injury.  The patient does have baseline dementia and was unable to participate or understand our discussion.  Given patient's age and fracture pattern, we discussed that it is best to attempt to treat this non-surgically.  Recommend use of knee immobilizer for 7 to 10 days and touchdown weightbearing of left lower extremity.  Recommend follow-up in clinic in 10 to 14 days with repeat x-ray evaluation of the left distal femur.    Renee Harder, MD    05/13/2022 7:24 AM

## 2022-05-13 NOTE — Progress Notes (Signed)
Patient's son states that the patient needs to be on solid food.  He states that she always aspirates.  He states that she is 69 and willing to take the risk.  Speech therapist recommended pured diet dysphagia 1.  Patient's son insists that she should be on solid food.  As per the sons wishes, I changed diet to dysphagia 3 diet with thin liquids.  High risk for aspiration.

## 2022-05-13 NOTE — Assessment & Plan Note (Deleted)
Started nebulizer treatments budesonide and DuoNeb.

## 2022-05-13 NOTE — TOC Progression Note (Signed)
Transition of Care [facility]) - Progression Note    Patient Details  Name: Janice Hoffman MRN: [id number] Date of Birth: 15-Sep-1925  Transition of Care [facility]) CM/SW [place], RN Phone Number: 05/13/2022, 12:52 PM  Clinical Narrative:    TOC continues to follow and assist with DC planning, Once PT evals are in and recommendations are made will assist with DC, Patient on Puree diet, has a paid caregiver         Expected Discharge Plan and Services                                                 Social Determinants of Health (SDOH) Interventions    Readmission Risk Interventions     No data to display

## 2022-05-13 NOTE — Evaluation (Addendum)
Clinical/Bedside Swallow Evaluation Patient Details  Name: Janice Hoffman MRN: [id number] Date of Birth: [birth year]  Today's Date: 05/13/2022 Time: SLP Start Time (ACUTE ONLY): 43 SLP Stop Time (ACUTE ONLY): 1 SLP Time Calculation (min) (ACUTE ONLY): 60 min  Past Medical History:  Past Medical History:  Diagnosis Date   Asthma    COPD (chronic obstructive pulmonary disease) ([place])    Herpes zoster    Hypertension    Neuralgia    Past Surgical History:  Past Surgical History:  Procedure Laterality Date   LEG SURGERY     HPI:  Pt is a 88 y.o. female with medical history significant of osteoporosis and Dementia. Tried to walk early this am and fell. NO acute head injury. Reported pain in left knee. Found to have a non-displaced femur fracture of that leg.  Per Ortho note, patient typically ambulates with a walker some, however was not using her walker when she fell.  X-rays and CT scan were performed in the ER which shows presence of a nondisplaced distal femur fracture.   The patient lives at home with family and a caregiver.   CXR: Mild right basilar subsegmental atelectasis or infiltrate is noted.  Elevated left hemidiaphragm.    Assessment / Plan / Recommendation  Clinical Impression   Pt seen for BSE this date. Pt required full assistance in bed for upright positioning. Nonverbal and did not follow any commands. She allowed oral care w/ tactile cues. Afebrile; WBC WNL. Pt had some wheezing during breakfast meal this morning -- Caregiver stated pt had not had her breathing tx that she usually has b/f eating.   OF NOTE: there was severe debris caked on the U/L Dentures. The Upper plate was removed and cleaned/soaked at end of session; could not remove the Lower plate. Removed as much debris as possible w/ oral swab kits. Caregiver to ask Son about removing the Lower plate when he visits next since he is used to her Denture/oral care at home.   Pt appears to present w/ Moderate+ oral  phase dysphagia suspect most impacted by her Baseline Dementia; w/ increased risk for pharyngeal phase dysphagia in setting of Dementia, oral phase deficits, and overall weakness d/t illness. ANY Cognitive decline can impact overall awareness/timing of swallow and safety during po tasks which increases risk for aspiration, choking. Pt's risk for aspiration can be reduced when following general aspiration precautions and using a modified diet consistency w/ Supervision and support during feeding. OF NOTE: pt's Dentures at baseline were severely covered in debris and did not appear to have been cleaned recently. It was judged best to remove the Upper Denture; lower plate was cleaned the best it could be but should also be removed when able to be.  She required MOD verbal/visual/tactile cues for follow through during po tasks and self-feeding.   Bolus acceptance was appropriate during the oral phase. Bolus management and oral clearing appeared grossly [facility] w/ trials of thin liquids and purees. Bolus management broke down w/ increased textured trials (softened solids). Deficits c/b increased oral phase time, w/ "munching" behavior then oral holding. Small sips of liquid were given to encourage continued addressing of the bolus material for swallowing and oral clearing.  During the pharyngeal phase, pt exhibited No overt clinical s/s of aspiration w/ all trial consistencies following general aspiration precautions: No coughing nor throat clearing, no decline in respiratory presentation. Pt was encouraged w/ tactile cues to Hold the Cup to participate in drinking (which increases her safety  and overall awareness of task). O2 sats remained 97-98%.  Cursory OM exam revealed no unilateral weakness; tongue base strength was [facility] during bunching. Pt was able to minimally participate in self-feeding(holding cup) but required feeding support w/ foods.   Recommend a Dysphagia level 1 (puree) w/ thin liquids at this time w/  aspiration precautions; Strict Supervision during all oral intake. Feeding Support at all meals. Recommend Pills CRUSHED in Puree. And, ORAL CARE DAILY. ST services will continue to monitor pt's progress while admitted; education w/ Family when present.  Recommend PAlliative Care f/u for [place] and continued education on impact of illness in the older-elderly pt on swallowing and on general oral intake overall. Son agreed. MD/[name] updated.  SLP Visit Diagnosis: Dysphagia, oropharyngeal phase (R13.12) (advanced age; Dementia)    Aspiration Risk  Mild aspiration risk;Risk for inadequate nutrition/hydration (reduced following precautions)    Diet Recommendation   Dysphagia level 1 (puree) w/ thin liquids at this time w/ aspiration precautions; Strict Supervision during all oral intake. Feeding Support at all meals. ORAL CARE POST MEALS.  Medication Administration: Crushed with puree    Other  Recommendations Recommended Consults:  (Dietician f/u; Palliative Care f/u for [place]) Oral Care Recommendations: Oral care BID;Oral care before and after PO;Staff/trained caregiver to provide oral care (Denture care) Other Recommendations:  (n/a currently)    Recommendations for follow up therapy are one component of a multi-disciplinary discharge planning process, led by the attending physician.  Recommendations may be updated based on patient status, additional functional criteria and insurance authorization.  Follow up Recommendations No SLP follow up (TBD)      Assistance Recommended at Discharge Frequent or constant Supervision/Assistance  Functional Status Assessment Patient has had a recent decline in their functional status and/or demonstrates limited ability to make significant improvements in function in a reasonable and predictable amount of time  Frequency and Duration min 1 x/week  1 week       Prognosis Prognosis for Safe Diet Advancement: Guarded (-Fair) Barriers to Reach Goals: Time post  onset;Severity of deficits;Cognitive deficits Barriers/Prognosis Comment: advanced age and Dementia      Swallow Study   General Date of Onset: 05/12/22 HPI: Pt is a 88 y.o. female with medical history significant of osteoporosis and Dementia. Tried to walk early this am and fell. NO acute head injury. Reported pain in left knee. Found to have a non-displaced femur fracture of that leg.  Per Ortho note, patient typically ambulates with a walker some, however was not using her walker when she fell.  X-rays and CT scan were performed in the ER which shows presence of a nondisplaced distal femur fracture.   The patient lives at home with family and a caregiver.   CXR: Mild right basilar subsegmental atelectasis or infiltrate is noted.  Elevated left hemidiaphragm. Type of Study: Bedside Swallow Evaluation Previous Swallow Assessment: 2020 - dysphagia diet rec'd Diet Prior to this Study: Regular;Thin liquids (currently ordered) Temperature Spikes Noted: No (wbc 6.6) Respiratory Status: Room air History of Recent Intubation: No Behavior/Cognition: Alert;Cooperative;Pleasant mood;Confused;Distractible;Requires cueing (nonverbal) Oral Cavity Assessment: Dried secretions;Dry (severe buildup on Dentures(U/L)) Oral Care Completed by SLP: Yes Oral Cavity - Dentition: Dentures, top;Dentures, bottom (removed the Upper denture plate; could not remove the lower plate -- asked that Son remove it when he visits next) Vision:  (n/a) Self-Feeding Abilities: Needs assist;Needs set up;Total assist (guidance w/ cup drinking; feeding assistance w/ foods) Patient Positioning: Upright in bed (needed full positioning support) Baseline Vocal Quality:  (  nonverbal) Volitional Cough: Cognitively unable to elicit Volitional Swallow: Unable to elicit    Oral/Motor/Sensory Function Overall Oral Motor/Sensory Function: Within functional limits (no unilateral weakness)   Ice Chips Ice chips: Not tested   Thin Liquid Thin  Liquid: Impaired (min) Presentation: Cup;Self Fed;Straw (full support; 2 sips via cup, 10+ trials via straw) Oral Phase Impairments: Poor awareness of bolus Oral Phase Functional Implications: Prolonged oral transit (intermittently) Pharyngeal  Phase Impairments:  (no overt clinical s/s)    Nectar Thick Nectar Thick Liquid: Not tested   Honey Thick Honey Thick Liquid: Not tested   Puree Puree: Within functional limits Presentation: Spoon (fed; 15+ trials)   Solid     Solid: Impaired Presentation: Spoon (fed; 2 trials) Oral Phase Impairments: Poor awareness of bolus;Impaired mastication (munched) Oral Phase Functional Implications: Prolonged oral transit;Impaired mastication;Oral holding Pharyngeal Phase Impairments:  (none noted) Other Comments: alternated w/ sip of liquid to aid swallow and clearing        Janice Kenner, MS, CCC-SLP Speech Language Pathologist Rehab Services; [place] [phone] (ascom) Joanann Mies 05/13/2022,3:01 PM

## 2022-05-13 NOTE — Assessment & Plan Note (Addendum)
Chest x-ray yesterday shows atelectasis versus possible infiltrate.  Holding off on antibiotics at this point since white blood cell count is normal and no fever.  As per son increase diet to dysphagia 3 diet with thin liquids.  Speech therapy signed off.

## 2022-05-13 NOTE — Plan of Care (Signed)

## 2022-05-13 NOTE — Assessment & Plan Note (Signed)
Leg immobilizer.  PT and OT consultations.  Pain control.

## 2022-05-13 NOTE — Progress Notes (Signed)
Occupational Therapy Evaluation Patient Details Name: Janice Hoffman MRN: [id number] DOB: [birth year] Today's Date: 05/13/2022   History of Present Illness Pt. is a 88 y.o. female who was admitted to [facility] with a Left  Nondisplaced Distal Femur Fracture sustained during a fall. PMHx includes: CVA, Dementia without Behavior Disturbances, Pernicious Anemia, HTN, Herpes Zoster, HTN, COPD.   Clinical Impression   Pt. Presents with weakness, HOH, impaired cognition, TDWB status, and limited functional mobility which hinder her ability to complete ADL, and IADL tasks. Pt. Resides at home with her son. Pt. Required assist from personal caregivers with ADL, and IADL tasks. Pt.'s family, and caregiver report they would have the pt. Engage in self-care tasks at home, and then would assist her as needed. Pt. required assist with meal preparation, medication management, and performing home management, and household tasks. They report cognition typically worsens when the Pt. Is hospitalized with the change of environment. Pt. Is very HOH. Pt. Requires Max cues for 1 step light automatic ADL tasks.  Pt./caregiver education was provided about opportunities for the Pt. To engage in ADL tasks, and use her UEs. Reviewed LLE Weightbearing status. Pt. Could benefit from OT services for ADL training, A/E training, and pt./caregiver Education about cognitive compensatory strategies, weightbearing status, and positioning. Pt. Would benefit from follow-up OT services upon discharge.     Recommendations for follow up therapy are one component of a multi-disciplinary discharge planning process, led by the attending physician.  Recommendations may be updated based on patient status, additional functional criteria and insurance authorization.   Follow Up Recommendations  Skilled nursing-short term rehab (<3 hours/day)    Assistance Recommended at Discharge    Patient can return home with the following A lot of help with  bathing/dressing/bathroom;Assist for transportation;Direct supervision/assist for medications management;Two people to help with walking and/or transfers;Assistance with cooking/housework    Functional Status Assessment     Equipment Recommendations       Recommendations for Other Services       Precautions / Restrictions Precautions Precautions: Fall Required Braces or Orthoses: Knee Immobilizer - Left (7-10 days) Restrictions Weight Bearing Restrictions: Yes LLE Weight Bearing: Touchdown weight bearing      Mobility Bed Mobility               General bed mobility comments: Deferred    Transfers                   General transfer comment: Deferred secondary to TDWB status, and cognition.      Balance                                           ADL either performed or assessed with clinical judgement   ADL Overall ADL's : Needs assistance/impaired     Grooming: Moderate assistance;Minimal assistance Grooming Details (indicate cue type and reason): Cues For 1 step automatic tasks     Lower Body Bathing: Total assistance   Upper Body Dressing : Total assistance   Lower Body Dressing: Total assistance                       Vision   Vision Assessment?: Vision impaired- to be further tested in functional context     Perception     Praxis      Pertinent Vitals/Pain Pain Assessment Pain Assessment: No/denies pain Faces  Pain Scale: No hurt Facial Expression: smiling or inexpressive Body Language: relaxed Consolability: no need to console     Hand Dominance     Extremity/Trunk Assessment Upper Extremity Assessment Upper Extremity Assessment: Difficult to assess due to impaired cognition           Communication Communication Communication: HOH (Granddaughter reports Pt. refuses to wear hearing aides, and throws them out.)   Cognition Arousal/Alertness: Awake/alert Behavior During Therapy: Flat  affect Overall Cognitive Status: History of cognitive impairments - at baseline                                 General Comments: Pt. unable to follow 1 step directions.     General Comments       Exercises     Shoulder Instructions      Home Living Family/patient expects to be discharged to:: Private residence   Available Help at Discharge: Family;Personal care attendant Type of Home: House       Home Layout: One level     Bathroom Shower/Tub: Walk-in shower         Home Equipment: Hand held shower head;Shower Land (2 wheels)          Prior Functioning/Environment                 ADLs Comments: Pt. was able to perfrom light ADL tasks at home, has a caregiver to assist with ADLs, IADLs,        OT Problem List: Decreased strength      OT Treatment/Interventions:      OT Goals(Current goals can be found in the care plan section) Acute Rehab OT Goals OT Goal Formulation: Patient unable to participate in goal setting  OT Frequency: Min 2X/week    Co-evaluation              AM-PAC OT "6 Clicks" Daily Activity     Outcome Measure Help from another person eating meals?: A Little Help from another person taking care of personal grooming?: A Lot Help from another person toileting, which includes using toliet, bedpan, or urinal?: Total Help from another person bathing (including washing, rinsing, drying)?: Total Help from another person to put on and taking off regular upper body clothing?: Total Help from another person to put on and taking off regular lower body clothing?: Total 6 Click Score: 9   End of Session    Activity Tolerance: Other (comment) (Limited by cogniiton, and TDWB status) Patient left:    OT Visit Diagnosis: Cognitive communication deficit (R41.841);Muscle weakness (generalized) (M62.81)                Time: 1610-9604 OT Time Calculation (min): 20 min Charges:  OT General Charges $OT Visit: 1  Visit OT Evaluation $OT Eval Low Complexity: 1 Low  Harrel Carina, MS, OTR/L   Harrel Carina 05/13/2022, 4:23 PM

## 2022-05-13 NOTE — Progress Notes (Signed)
Patient's son called back and he does not want her on any pain medications except for extra strength Tylenol.  Pain medications discontinued.  He states that it took her 2 years to get off pain medications previously because she was addicted.

## 2022-05-13 NOTE — Plan of Care (Signed)

## 2022-05-13 NOTE — Progress Notes (Addendum)
  Progress Note   Patient: Janice Hoffman HER:[id number] DOB: February 12, 1926 DOA: 05/12/2022     1 DOS: the patient was seen and examined on 05/13/2022     Assessment and Plan: * Closed nondisplaced fracture of distal epiphysis of left femur with routine healing Leg immobilizer.  PT and OT consultations.  Pain control.  Wheezing Started nebulizer treatments budesonide and DuoNeb.  Aspiration pneumonia ([place]) With white count being normal and no fever we will try to hold off on antibiotics currently.  Appreciate speech therapy evaluation.  Patient placed on dysphagia 1 diet with thin liquids.  COPD (chronic obstructive pulmonary disease) (HCC) Change to nebulizer treatments with wheezing.  Trying to hold off on steroids.  History of CVA (cerebrovascular accident) Found incidentally on CT scan, new since 2020 but not acute.  Admitting physician placed on Plavix.  Essential hypertension Continue Norvasc and Metolprolol Prn IV reduction  Dementia without behavioral disturbance (HCC) Stable at baseline  Pernicious anemia On monthly B12 injections        Subjective: Patient very hard of hearing.  Found to have a distal femur fracture.  Had audible wheezing after eating breakfast.  She was just chewing on what was in her mouth and did not swallow lip.  Physical Exam: Vitals:   05/12/22 1836 05/12/22 2326 05/13/22 0749 05/13/22 1338  BP: (!) 143/77 132/71 (!) 160/60 135/76  Pulse: 87 84 88 79  Resp:  16 16   Temp:  97.9 F (36.6 C) 98 F (36.7 C)   TempSrc:   Oral   SpO2: 98% 91% 93% 95%  Weight:      Height:       Physical Exam HENT:     Head: Normocephalic.     Mouth/Throat:     Pharynx: No oropharyngeal exudate.  Eyes:     General: Lids are normal.     Conjunctiva/sclera: Conjunctivae normal.  Cardiovascular:     Rate and Rhythm: Normal rate and regular rhythm.     Heart sounds: Normal heart sounds, S1 normal and S2 normal.  Pulmonary:     Breath sounds: Examination  of the right-middle field reveals wheezing. Examination of the left-middle field reveals wheezing. Examination of the right-lower field reveals decreased breath sounds and wheezing. Examination of the left-lower field reveals decreased breath sounds and wheezing. Decreased breath sounds and wheezing present.  Abdominal:     Palpations: Abdomen is soft.     Tenderness: There is no abdominal tenderness.  Musculoskeletal:     Right lower leg: No swelling.     Left lower leg: No swelling.  Skin:    General: Skin is warm.     Findings: No rash.  Neurological:     Mental Status: She is alert.     Data Reviewed: Chest x-ray right lower lobe atelectasis versus infiltrate.  Knee x-ray shows a lucency crossing the distal femoral diaphoresis most consistent with nondisplaced fracture Hypoxic at 6.6, hemoglobin 11.7, platelet count 10/29/2018, creatinine 0.56, electrolytes normal  Family Communication: Spoke with patient's caregiver at the bedside.  Left message for patient's son.  Disposition: Status is: Inpatient Remains inpatient appropriate because: has distal femur fracture  Planned Discharge Destination: Skilled nursing facility    Time spent: 28 minutes  Author: Loletha Grayer, MD 05/13/2022 2:51 PM  For on call review [email or web address].

## 2022-05-14 ENCOUNTER — Encounter: Payer: Self-pay | Admitting: Family Medicine

## 2022-05-14 DIAGNOSIS — N179 Acute kidney failure, unspecified: Secondary | ICD-10-CM

## 2022-05-14 DIAGNOSIS — S72402A Unspecified fracture of lower end of left femur, initial encounter for closed fracture: Secondary | ICD-10-CM

## 2022-05-14 DIAGNOSIS — J9601 Acute respiratory failure with hypoxia: Secondary | ICD-10-CM | POA: Diagnosis not present

## 2022-05-14 DIAGNOSIS — J441 Chronic obstructive pulmonary disease with (acute) exacerbation: Secondary | ICD-10-CM | POA: Diagnosis not present

## 2022-05-14 DIAGNOSIS — R710 Precipitous drop in hematocrit: Secondary | ICD-10-CM

## 2022-05-14 DIAGNOSIS — S72402S Unspecified fracture of lower end of left femur, sequela: Secondary | ICD-10-CM | POA: Diagnosis not present

## 2022-05-14 DIAGNOSIS — J690 Pneumonitis due to inhalation of food and vomit: Secondary | ICD-10-CM | POA: Diagnosis not present

## 2022-05-14 HISTORY — DX: Precipitous drop in hematocrit: R71.0

## 2022-05-14 LAB — CBC
HCT: 30.7 % — ABNORMAL LOW (ref 36.0–46.0)
Hemoglobin: 9.9 g/dL — ABNORMAL LOW (ref 12.0–15.0)
MCH: 28.4 pg (ref 26.0–34.0)
MCHC: 32.2 g/dL (ref 30.0–36.0)
MCV: 88 fL (ref 80.0–100.0)
Platelets: 187 10*3/uL (ref 150–400)
RBC: 3.49 MIL/uL — ABNORMAL LOW (ref 3.87–5.11)
RDW: 14.6 % (ref 11.5–15.5)
WBC: 8 10*3/uL (ref 4.0–10.5)
nRBC: 0 % (ref 0.0–0.2)

## 2022-05-14 LAB — BASIC METABOLIC PANEL
Anion gap: 9 (ref 5–15)
BUN: 25 mg/dL — ABNORMAL HIGH (ref 8–23)
CO2: 27 mmol/L (ref 22–32)
Calcium: 8.6 mg/dL — ABNORMAL LOW (ref 8.9–10.3)
Chloride: 101 mmol/L (ref 98–111)
Creatinine, Ser: 1 mg/dL (ref 0.44–1.00)
GFR, Estimated: 52 mL/min — ABNORMAL LOW (ref >60)
Glucose, Bld: 92 mg/dL (ref 70–99)
Potassium: 4 mmol/L (ref 3.5–5.1)
Sodium: 137 mmol/L (ref 135–145)

## 2022-05-14 MED ORDER — ADULT MULTIVITAMIN W/MINERALS CH
1.0000 | ORAL_TABLET | Freq: Every day | ORAL | Status: DC
  Administered 2022-05-15 – 2022-05-18 (×4): 1 via ORAL
  Filled 2022-05-14 (×4): qty 1

## 2022-05-14 MED ORDER — METHYLPREDNISOLONE SODIUM SUCC 40 MG IJ SOLR
40.0000 mg | Freq: Every day | INTRAMUSCULAR | Status: DC
  Administered 2022-05-14: 40 mg via INTRAVENOUS
  Filled 2022-05-14 (×2): qty 1

## 2022-05-14 MED ORDER — SODIUM CHLORIDE 0.9 % IV BOLUS
250.0000 mL | Freq: Once | INTRAVENOUS | Status: AC
  Administered 2022-05-14: 250 mL via INTRAVENOUS

## 2022-05-14 MED ORDER — IPRATROPIUM-ALBUTEROL 0.5-2.5 (3) MG/3ML IN SOLN
3.0000 mL | Freq: Three times a day (TID) | RESPIRATORY_TRACT | Status: DC
  Administered 2022-05-14 – 2022-05-16 (×6): 3 mL via RESPIRATORY_TRACT
  Filled 2022-05-14 (×6): qty 3

## 2022-05-14 MED ORDER — ENOXAPARIN SODIUM 30 MG/0.3ML IJ SOSY
30.0000 mg | PREFILLED_SYRINGE | INTRAMUSCULAR | Status: DC
  Administered 2022-05-14 – 2022-05-15 (×2): 30 mg via SUBCUTANEOUS
  Filled 2022-05-14 (×2): qty 0.3

## 2022-05-14 MED ORDER — STERILE WATER FOR INJECTION IJ SOLN
INTRAMUSCULAR | Status: AC
  Filled 2022-05-14: qty 10

## 2022-05-14 NOTE — Progress Notes (Signed)
       CROSS COVER NOTE  NAME: Janice Hoffman MRN: [id number] DOB : 10/22/1925    Date of Service   05/14/2022   HPI/Events of Note   Notified by nursing that [name] has not voided this shift. Last documented output was at ~121m at 1800 05/13/22.  Bladder scan 4160m  Interventions   Plan: In and out cath x1    This document was prepared using Dragon voice recognition software and may include unintentional dictation errors.  KaNeomia GlassNP, MHA, FNP-BC Nurse Practitioner [facility] ([phone]

## 2022-05-14 NOTE — Assessment & Plan Note (Signed)
Continue nebulizer treatments.  Add Solu-Medrol 40 mg daily.

## 2022-05-14 NOTE — Progress Notes (Signed)
Progress Note   Patient: TORIANNE LAFLAM URK:[id number] DOB: 1926-03-17 DOA: 05/12/2022     2 DOS: the patient was seen and examined on 05/14/2022   Brief hospital course: 88 year old female with past medical history of COPD, dementia, history of CVA, hypertension, pernicious anemia.  She presented to the hospital with a fall and found to have a closed fracture of the distal femur.  The patient had an wheezing on 9/4 and 05/14/2022.  Started nebulizer treatments on 05/13/2022.  Started Solu-Medrol 05/14/2022.  Patient also had hypoxia on 07/14/2022 and started on oxygen.  Assessment and Plan:  Closed fracture of left distal femur (HCC) Leg immobilizer.  PT and OT recommending rehab.  As per patient's son, pain control only with Tylenol.  COPD with acute exacerbation ([place]) Continue nebulizer treatments.  Add Solu-Medrol 40 mg daily.  Acute respiratory failure with hypoxia (HCC) Pulse ox 86% on room air.  Patient placed on 3 L of oxygen.  History of CVA (cerebrovascular accident) Found incidentally on CT scan, new since 2020 but not acute.  Admitting physician placed on Plavix.  Aspiration pneumonia ([place]) Chest x-ray yesterday shows atelectasis versus possible infiltrate.  Holding off on antibiotics at this point since white blood cell count is normal and no fever.  As per son increase diet to dysphagia 3 diet with thin liquids.  Speech therapy signed off.  Essential hypertension Continue Norvasc and Metolprolol   Dementia without behavioral disturbance (HCC) Stable at baseline  Pernicious anemia On monthly B12 injections  AKI (acute kidney injury) ([place]) Creatinine went up from 0.56 to 1.0.  We will 500 cc of IV fluids today  Drop in hemoglobin Hemoglobin dropped from 11.7 down to 9.9.  Watch closely.        Subjective: Patient very hard of hearing.  Was in pain this morning when I saw her.  Nursing staff was getting ready to give her Tylenol.  Physical Exam: Vitals:   05/14/22  0410 05/14/22 0752 05/14/22 0826 05/14/22 1108  BP:   (!) 144/84   Pulse:   88   Resp:      Temp:   98.3 F (36.8 C)   TempSrc:      SpO2: 91% 92% 91% (!) 86%  Weight:      Height:       Physical Exam HENT:     Head: Normocephalic.     Mouth/Throat:     Pharynx: No oropharyngeal exudate.  Eyes:     General: Lids are normal.     Conjunctiva/sclera: Conjunctivae normal.  Cardiovascular:     Rate and Rhythm: Normal rate and regular rhythm.     Heart sounds: Normal heart sounds, S1 normal and S2 normal.  Pulmonary:     Breath sounds: Examination of the right-middle field reveals decreased breath sounds and wheezing. Examination of the left-middle field reveals decreased breath sounds and wheezing. Examination of the right-lower field reveals decreased breath sounds and wheezing. Examination of the left-lower field reveals decreased breath sounds and wheezing. Decreased breath sounds and wheezing present.  Abdominal:     Palpations: Abdomen is soft.     Tenderness: There is no abdominal tenderness.  Musculoskeletal:     Right lower leg: No swelling.     Left lower leg: No swelling.     Comments: Left leg in brace.  Skin:    General: Skin is warm.     Findings: No rash.  Neurological:     Mental Status: She is alert.  Data Reviewed: Creatinine up to 1.0, hemoglobin 9.9  Family Communication: Spoke with son on the phone yesterday evening.  Left message today.  Disposition: Status is: Inpatient Remains inpatient appropriate because: Now started on oxygen and steroids IV for COPD exacerbation.  Planned Discharge Destination: Rehab    Time spent: 27 minutes Case discussed with [facility] and nursing staff  Author: Loletha Grayer, MD 05/14/2022 1:11 PM  For on call review [email or web address].

## 2022-05-14 NOTE — Assessment & Plan Note (Signed)
Creatinine went up from 0.56 to 1.0.  We will 500 cc of IV fluids today

## 2022-05-14 NOTE — Assessment & Plan Note (Signed)
Leg immobilizer.  PT and OT recommending rehab.  As per patient's son, pain control only with Tylenol.

## 2022-05-14 NOTE — NC FL2 (Signed)
[place] LEVEL OF CARE SCREENING TOOL     IDENTIFICATION  Patient Name: Janice Hoffman Birthdate: 1926/07/18 Sex: female Admission Date (Current Location): 05/12/2022  [facility] and [place] Number:  Engineering geologist and Address:  [facility], [address], [place], [place] [address]      Provider Number: [id number]  Attending Physician Name and Address:  Loletha Grayer, MD  Relative Name and Phone Number:  [name] son [phone]    Current Level of Care: Hospital Recommended Level of Care: [place] Prior Approval Number:    Date Approved/Denied:   PASRR Number: [id number]  Discharge Plan: SNF    Current Diagnoses: Patient Active Problem List   Diagnosis Date Noted   Closed fracture of left distal femur ([place]) 05/14/2022   Acute respiratory failure with hypoxia ([place]) 05/14/2022   Drop in hemoglobin 05/14/2022   AKI (acute kidney injury) ([place]) 05/14/2022   Femur fracture, left ([place]) 05/12/2022   History of CVA (cerebrovascular accident) 05/12/2022   Constipation 07/18/2019   Aspiration pneumonia ([place]) 07/18/2019   Dementia without behavioral disturbance ([place])    Palliative care by specialist    Goals of care, counseling/discussion    Protein-calorie malnutrition, severe 09/16/2018   COPD with acute exacerbation ([place]) 03/14/2018   New onset a-fib ([place]) 06/15/2017   Near syncope 05/31/2017   Essential hypertension 01/05/2017   Dysphagia 01/05/2017   Osteoarthritis 10/12/2014   Osteoporosis, post-menopausal 10/12/2014   Pernicious anemia 06/07/2014   Vitamin D deficiency 06/07/2014   Post-traumatic osteoarthritis of left knee 05/24/2014   Pain in joint, lower leg 08/03/2013   POSTHERPETIC NEURALGIA 05/12/2008   B12 DEFICIENCY 04/26/2008   CONSTIPATION 04/26/2008   LEG EDEMA, BILATERAL 04/26/2008    Orientation RESPIRATION BLADDER Height & Weight     Self  Normal Continent, External catheter Weight:  54.1 kg Height:  '5\' 4"'$  (162.6 cm)  BEHAVIORAL SYMPTOMS/MOOD NEUROLOGICAL BOWEL NUTRITION STATUS      Continent Diet (See dc summary)  AMBULATORY STATUS COMMUNICATION OF NEEDS Skin   Extensive Assist Verbally Normal                       Personal Care Assistance Level of Assistance  Bathing, Feeding, Dressing Bathing Assistance: Maximum assistance Feeding assistance: Limited assistance Dressing Assistance: Maximum assistance     Functional Limitations Info             SPECIAL CARE FACTORS FREQUENCY  PT (By licensed PT), OT (By licensed OT)     PT Frequency: 5 times per week OT Frequency: 5 times per week            Contractures Contractures Info: Not present    Additional Factors Info  Code Status, Allergies Code Status Info: DNR Allergies Info: Aspirin, Nsaids, Sulfa Antibiotics, Ceftriaxone, Codeine, Ciprofloxacin, Penicillins, Phenol, Promethazine Hcl           Current Medications (05/14/2022):  This is the current hospital active medication list Current Facility-Administered Medications  Medication Dose Route Frequency Provider Last Rate Last Admin   0.9 %  sodium chloride infusion  250 mL Intravenous PRN Donnamae Jude, MD       acetaminophen (TYLENOL) tablet 1,000 mg  1,000 mg Oral Q6H PRN Loletha Grayer, MD   1,000 mg at 05/14/22 0957   amLODipine (NORVASC) tablet 10 mg  10 mg Oral Daily Donnamae Jude, MD   10 mg at 05/14/22 0955   budesonide (PULMICORT) nebulizer solution  0.25 mg  0.25 mg Nebulization BID Loletha Grayer, MD   0.25 mg at 05/14/22 6759   clopidogrel (PLAVIX) tablet 75 mg  75 mg Oral Daily Donnamae Jude, MD   75 mg at 05/14/22 0955   enoxaparin (LOVENOX) injection 30 mg  30 mg Subcutaneous Q24H Dorothe Pea, RPH       ipratropium-albuterol (DUONEB) 0.5-2.5 (3) MG/3ML nebulizer solution 3 mL  3 mL Nebulization TID Loletha Grayer, MD   3 mL at 05/14/22 1316   methylPREDNISolone sodium succinate (SOLU-MEDROL) 40 mg/mL injection 40  mg  40 mg Intravenous Daily Loletha Grayer, MD   40 mg at 05/14/22 1043   metoprolol tartrate (LOPRESSOR) injection 5 mg  5 mg Intravenous Q6H PRN Donnamae Jude, MD   5 mg at 05/13/22 1311   metoprolol tartrate (LOPRESSOR) tablet 25 mg  25 mg Oral BID Donnamae Jude, MD   25 mg at 05/14/22 0955   multivitamin with minerals tablet 1 tablet  1 tablet Oral Daily Wieting, Richard, MD       ondansetron [facility]) tablet 4 mg  4 mg Oral Q6H PRN Donnamae Jude, MD       Or   ondansetron [facility]) injection 4 mg  4 mg Intravenous Q6H PRN Donnamae Jude, MD       polyethylene glycol (MIRALAX / GLYCOLAX) packet 17 g  17 g Oral Daily PRN Donnamae Jude, MD   17 g at 05/13/22 0943   sodium chloride flush (NS) 0.9 % injection 3 mL  3 mL Intravenous Q12H Donnamae Jude, MD   3 mL at 05/14/22 1044   sodium chloride flush (NS) 0.9 % injection 3 mL  3 mL Intravenous PRN Donnamae Jude, MD       thiamine (VITAMIN B1) tablet 100 mg  100 mg Oral Daily Donnamae Jude, MD   100 mg at 05/14/22 1638     Discharge Medications: Please see discharge summary for a list of discharge medications.  Relevant Imaging Results:  Relevant Lab Results:   Additional Information SS#[id number]  Conception Oms, RN

## 2022-05-14 NOTE — Progress Notes (Signed)
SLP Cancellation Note  Patient Details Name: Janice Hoffman MRN: [id number] DOB: 10/05/1925   Cancelled treatment:       Reason Eval/Treat Not Completed:  (chart reviewed; consulted MD re: Son's decision re: care of pt.) Per Chat discussion w/ MD, and MD note yesterday PM (post BSE) re: TC w/ Son, "Patient's son states that the patient needs to be on solid food.  He states that she always aspirates.  He states that she is 54 and willing to take the risk.  Speech therapist recommended pured diet dysphagia 1.  Patient's son insists that she should be on solid food.  As per the sons wishes, I changed diet to dysphagia 3 diet with thin liquids.  High risk for aspiration.".  ST services will sign off at this time. MD to reconsult if any new needs arise while pt is admitted.      Orinda Kenner, [place], [place] Speech Language Pathologist Rehab Services; [place] [phone] (ascom) Truman Aceituno 05/14/2022, 8:31 AM

## 2022-05-14 NOTE — Evaluation (Addendum)
Physical Therapy Evaluation Patient Details Name: Janice Hoffman MRN: [id number] DOB: 1926/05/17 Today's Date: 05/14/2022  History of Present Illness  Pt. is a 88 y.o. female who was admitted to [facility] with a Left  Nondisplaced Distal Femur Fracture sustained during a fall. PMHx includes: CVA, Dementia without Behavior Disturbances, Pernicious Anemia, HTN, Herpes Zoster, HTN, COPD.  Clinical Impression  Pt presents with oxygen saturation at 86% on RA upon entry into the room. Nasal cannula had been removed. Nasal cannula reapplied at 3L with oxygen saturation at 94%. Patient educated to not remove cannula. She did not attempt to remove for the remainder of the session.  The pt presents this session initially without complaints of pain, and is agreeable to "sit up" with PT, however when moving LLE the patient demonstrates and reports pain of the LLE. RN notified and pain medication provided. D/t to withdrawal and pain avoidance of the LLE the pt would require total assistance to achieve EOB. She would also require total A for all transfers. At this time the pt will required continued skilled PT in order to address functional deficits. She would benefit from PT attempting to coordinate with pain medication for mobility and also +2 assistance for safety with mobility. The pt was left in the chair position in bed this session. Will trial seeing patient at daily frequency d/t non-operative nature of her injury, will continue to assess her tolerance with improved pain management.      Recommendations for follow up therapy are one component of a multi-disciplinary discharge planning process, led by the attending physician.  Recommendations may be updated based on patient status, additional functional criteria and insurance authorization.  Follow Up Recommendations Skilled nursing-short term rehab (<3 hours/day) Can patient physically be transported by private vehicle: No    Assistance Recommended at Discharge  Frequent or constant Supervision/Assistance  Patient can return home with the following  Two people to help with walking and/or transfers;Two people to help with bathing/dressing/bathroom;Assist for transportation;Help with stairs or ramp for entrance;Direct supervision/assist for medications management;Direct supervision/assist for financial management;Assistance with feeding;Assistance with Forensic psychologist (measurements PT);Wheelchair cushion (measurements PT)  Recommendations for Other Services       Functional Status Assessment Patient has had a recent decline in their functional status and/or demonstrates limited ability to make significant improvements in function in a reasonable and predictable amount of time     Precautions / Restrictions Precautions Precautions: Fall Required Braces or Orthoses: Knee Immobilizer - Left Knee Immobilizer - Left: On at all times Restrictions Weight Bearing Restrictions: Yes LLE Weight Bearing: Touchdown weight bearing      Mobility  Bed Mobility Overal bed mobility: Needs Assistance Bed Mobility: Rolling, Supine to Sit Rolling: +2 for physical assistance, Total assist   Supine to sit: +2 for physical assistance, Total assist          Transfers                        Ambulation/Gait                  Stairs            Wheelchair Mobility    Modified Rankin (Stroke Patients Only)       Balance Overall balance assessment: History of Falls  Pertinent Vitals/Pain Pain Assessment Pain Assessment: Faces Faces Pain Scale: Hurts whole lot Pain Location: L knee; with movement. Pain Intervention(s): RN gave pain meds during session    [place] expects to be discharged to:: Private residence Living Arrangements: Children Available Help at Discharge: Family;Personal care attendant Type of  Home: House         Home Layout: One level Home Equipment: Hand held shower head;Shower seat;Rolling Environmental consultant (2 wheels) Additional Comments: Information taken from previous entry. No family present this session, pt unable to provide information regarding PLOF and home environment.    Prior Function                 ADLs Comments: Pt. was able to perfrom light ADL tasks at home, has a caregiver to assist with ADLs, IADLs,     Hand Dominance        Extremity/Trunk Assessment   Upper Extremity Assessment Upper Extremity Assessment: Overall WFL for tasks assessed    Lower Extremity Assessment Lower Extremity Assessment: Difficult to assess due to impaired cognition (Pt demonstrates moving of the RLE to command, however withdraws LLE with any movement.)       Communication   Communication: HOH  Cognition Arousal/Alertness: Awake/alert Behavior During Therapy: Flat affect Overall Cognitive Status: History of cognitive impairments - at baseline                                 General Comments: Pt able to answer yes/no questions inconsistently.        General Comments      Exercises     Assessment/Plan    PT Assessment Patient needs continued PT services  PT Problem List Decreased activity tolerance;Pain;Decreased strength;Decreased mobility;Decreased balance;Decreased range of motion;Decreased knowledge of precautions       PT Treatment Interventions Therapeutic activities;Functional mobility training;Patient/family education;Wheelchair mobility training    PT Goals (Current goals can be found in the Care Plan section)  Acute Rehab PT Goals Patient Stated Goal: pt unable to state goal PT Goal Formulation: Patient unable to participate in goal setting Time For Goal Achievement: 05/28/22 Potential to Achieve Goals: Fair    Frequency 7X/week     Co-evaluation               AM-PAC PT "6 Clicks" Mobility  Outcome Measure Help needed  turning from your back to your side while in a flat bed without using bedrails?: Total Help needed moving from lying on your back to sitting on the side of a flat bed without using bedrails?: Total Help needed moving to and from a bed to a chair (including a wheelchair)?: Total Help needed standing up from a chair using your arms (e.g., wheelchair or bedside chair)?: Total Help needed to walk in hospital room?: Total Help needed climbing 3-5 steps with a railing? : Total 6 Click Score: 6    End of Session Equipment Utilized During Treatment: Left knee immobilizer Activity Tolerance: Patient limited by pain Patient left: in bed;with nursing/sitter in room;with bed alarm set Nurse Communication: Mobility status PT Visit Diagnosis: Difficulty in walking, not elsewhere classified (R26.2);History of falling (Z91.81)    Time: 0940-1006 PT Time Calculation (min) (ACUTE ONLY): 26 min   Charges:   PT Evaluation $PT Eval Moderate Complexity: 1 Mod PT Treatments $Therapeutic Activity: 8-22 mins        11:21 AM, 05/14/22 Hailea Eaglin A. Saverio Danker, PT, DPT Physical Therapist -  [facility]   Lebron Nauert A Alba Perillo 05/14/2022, 11:17 AM

## 2022-05-14 NOTE — Progress Notes (Signed)
OT Cancellation Note  Patient Details Name: EURA MCCAUSLIN MRN: [id number] DOB: October 08, 1925   Cancelled Treatment:    Reason Eval/Treat Not Completed: Other (comment). Upon attempt, pt with nursing finishing up pt care. RN notes family present and assisting pt with self feeding meal. Pt not available for OT tx at this time. Will re-attempt OT tx at later date/time as pt is available.   Ardeth Perfect., MPH, MS, OTR/L ascom [phone] 05/14/22, 2:50 PM

## 2022-05-14 NOTE — Progress Notes (Signed)
Initial Nutrition Assessment  DOCUMENTATION CODES:   Not applicable  INTERVENTION:   -Magic cup TID with meals, each supplement provides 290 kcal and 9 grams of protein  -MVI with minerals daily -Feeding assistance with meals  NUTRITION DIAGNOSIS:   Increased nutrient needs related to chronic illness (COPD) as evidenced by estimated needs.  GOAL:   Patient will meet greater than or equal to 90% of their needs  MONITOR:   PO intake, Supplement acceptance  REASON FOR ASSESSMENT:   Malnutrition Screening Tool    ASSESSMENT:   Pt with past medical history of COPD, dementia, history of CVA, hypertension, pernicious anemia.  She presented to the hospital with a fall and found to have a closed fracture of the distal femur  Pt admitted with closed lt distal femur fracture.   9/4- s/p BSE- dysphagia 1 diet with thin liquids 9/5- diet advanced to dysphagia 3 per request of son; pt son accepts risk of aspiration  Reviewed I/O's: -260 ml x 24 hours  UOP: 500 ml x 24 hours   Pt unavailable at time of visit. Attempted to speak with pt via call to hospital room phone, however, unable to reach. RD unable to obtain further nutrition-related history or complete nutrition-focused physical exam at this time.    Per orthopedics notes, plan for non-surgical interventions.   Pt with variable oral intake. Noted meal completions 0-100%. Pt is on a dysphagia 3 diet per request of son and accepts aspiration risk.   Reviewed wt hx; noted wt gai over the past 3 years.   Therapies recommending SNF placement.   Medications reviewed and include solu-medrol and thiamine.   Labs reviewed: CBGS: 104.  Diet Order:   Diet Order             DIET DYS 3 Room service appropriate? Yes; Fluid consistency: Thin  Diet effective now                   EDUCATION NEEDS:   No education needs have been identified at this time  Skin:  Skin Assessment: Reviewed RN Assessment  Last BM:   Unknown  Height:   Ht Readings from Last 1 Encounters:  05/12/22 '5\' 4"'$  (1.626 m)    Weight:   Wt Readings from Last 1 Encounters:  05/12/22 54.1 kg    Ideal Body Weight:  54.5 kg  BMI:  Body mass index is 20.47 kg/m.  Estimated Nutritional Needs:   Kcal:  1450-1650  Protein:  65-80 grams  Fluid:  > 1.4 L    Loistine Chance, RD, LDN, [place] Registered Dietitian II Certified Diabetes Care and Education Specialist Please refer to [facility] for RD and/or RD on-call/weekend/after hours pager

## 2022-05-14 NOTE — Assessment & Plan Note (Signed)
Pulse ox 86% on room air.  Patient placed on 3 L of oxygen.

## 2022-05-14 NOTE — Progress Notes (Signed)
Pt has not voided. Bladder scan 417 mls. Neomia Glass made aware. Awaiting for response. Will continue to monitor pt.

## 2022-05-14 NOTE — Assessment & Plan Note (Signed)
Hemoglobin dropped from 11.7 down to 9.9.  Watch closely.

## 2022-05-14 NOTE — Hospital Course (Addendum)
88 year old female with past medical history of COPD, dementia, history of CVA, hypertension, pernicious anemia.  She presented to the hospital with a fall and found to have a closed fracture of the distal femur.  The patient had an wheezing on 9/4 and 05/14/2022.  Started nebulizer treatments on 05/13/2022.  Started Solu-Medrol 05/14/2022.  Patient also had hypoxia on 07/14/2022 and started on oxygen. Patient condition so far had improved, she is off oxygen, she has a bed available in nursing home, she will be discharged today. Patient has not been sleeping well in the hospital, she did not respond well with lower dose Seroquel, her son, Dr. Rock Nephew, suggested using lower dose Neurontin every evening. Patient was also off knee immobilizer due to increased pain.

## 2022-05-15 DIAGNOSIS — S72442D Displaced fracture of lower epiphysis (separation) of left femur, subsequent encounter for closed fracture with routine healing: Secondary | ICD-10-CM

## 2022-05-15 DIAGNOSIS — J9601 Acute respiratory failure with hypoxia: Secondary | ICD-10-CM | POA: Diagnosis not present

## 2022-05-15 DIAGNOSIS — J441 Chronic obstructive pulmonary disease with (acute) exacerbation: Secondary | ICD-10-CM | POA: Diagnosis not present

## 2022-05-15 MED ORDER — PREDNISONE 20 MG PO TABS
40.0000 mg | ORAL_TABLET | Freq: Every day | ORAL | Status: DC
  Administered 2022-05-15 – 2022-05-17 (×3): 40 mg via ORAL
  Filled 2022-05-15 (×3): qty 2

## 2022-05-15 MED ORDER — LIDOCAINE 5 % EX PTCH
1.0000 | MEDICATED_PATCH | Freq: Once | CUTANEOUS | Status: AC
  Administered 2022-05-15: 1 via TRANSDERMAL
  Filled 2022-05-15: qty 1

## 2022-05-15 MED ORDER — ACETAMINOPHEN 500 MG PO TABS
1000.0000 mg | ORAL_TABLET | Freq: Three times a day (TID) | ORAL | Status: DC
  Administered 2022-05-15 – 2022-05-18 (×8): 1000 mg via ORAL
  Filled 2022-05-15 (×10): qty 2

## 2022-05-15 MED ORDER — LIDOCAINE 5 % EX PTCH
1.0000 | MEDICATED_PATCH | CUTANEOUS | Status: DC
  Administered 2022-05-16 – 2022-05-18 (×3): 1 via TRANSDERMAL
  Filled 2022-05-15 (×3): qty 1

## 2022-05-15 MED ORDER — TRAMADOL HCL 50 MG PO TABS
25.0000 mg | ORAL_TABLET | Freq: Four times a day (QID) | ORAL | Status: DC | PRN
  Administered 2022-05-15 – 2022-05-17 (×5): 25 mg via ORAL
  Filled 2022-05-15 (×5): qty 1

## 2022-05-15 NOTE — TOC Progression Note (Signed)
Transition of Care [facility]) - Progression Note    Patient Details  Name: Janice Hoffman MRN: [id number] Date of Birth: October 13, 1925  Transition of Care [facility]) CM/SW [place], RN Phone Number: 05/15/2022, 4:26 PM  Clinical Narrative:    Spoke with the patients son Dr Rock Nephew, He chose [facility], I explained the Ins process, he is agreeable,        Expected Discharge Plan and Services                                                 Social Determinants of Health (SDOH) Interventions    Readmission Risk Interventions     No data to display

## 2022-05-15 NOTE — TOC Progression Note (Signed)
Transition of Care [facility]) - Progression Note    Patient Details  Name: Janice Hoffman MRN: [id number] Date of Birth: 22-Apr-1926  Transition of Care [facility]) CM/SW [place], RN Phone Number: 05/15/2022, 4:07 PM  Clinical Narrative:      Reached out to speak to  Dr Janice Hoffman  the patient's son to review bed offers, left a voice mail     Requested a call back  Expected Discharge Plan and Services                                                 Social Determinants of Health (SDOH) Interventions    Readmission Risk Interventions     No data to display

## 2022-05-15 NOTE — Progress Notes (Signed)
  Progress Note   Patient: Janice Hoffman XIH:[id number] DOB: 20-Feb-1926 DOA: 05/12/2022     3 DOS: the patient was seen and examined on 05/15/2022   Brief hospital course: 88 year old female with past medical history of COPD, dementia, history of CVA, hypertension, pernicious anemia.  She presented to the hospital with a fall and found to have a closed fracture of the distal femur.  The patient had an wheezing on 9/4 and 05/14/2022.  Started nebulizer treatments on 05/13/2022.  Started Solu-Medrol 05/14/2022.  Patient also had hypoxia on 07/14/2022 and started on oxygen.  Assessment and Plan:  Closed fracture of left distal femur (HCC) Leg immobilizer.  As needed Tylenol and tramadol.  Lidoderm patch.  Patient will need nursing home placement.  COPD with acute exacerbation ([place]) Acute respiratory failure with hypoxia (HCC) Aspiration pneumonia [facility]) Patient doing well, will continue wean off oxygen.  Continue oral steroids, patient lost IV. Continue hold antibiotics for now, will check procalcitonin level tomorrow.  Continue dysphagia 3 diet.  History of CVA (cerebrovascular accident) Continue Plavix.   Essential hypertension Continue Norvasc and Metolprolol   Dementia without behavioral disturbance ([place]) Patient was confused last night, pulled out IV.  Pernicious anemia On monthly B12 injections  AKI (acute kidney injury) ([place])  Check a BMP tomorrow.  Drop in hemoglobin Check iron B12 level.       Subjective:  Patient doing well, still has some pain in the left knee, on oxygen, denies any short of breath.  Physical Exam: Vitals:   05/15/22 0753 05/15/22 0806 05/15/22 1206 05/15/22 1408  BP: (!) 153/84     Pulse: 88     Resp:      Temp: 98.1 F (36.7 C)     TempSrc:      SpO2: 100% 99% 93% 96%  Weight:      Height:       General exam: Appears calm and comfortable  Respiratory system: Clear to auscultation. Respiratory effort normal. Cardiovascular system: S1 & S2  heard, RRR. No JVD, murmurs, rubs, gallops or clicks. No pedal edema. Gastrointestinal system: Abdomen is nondistended, soft and nontender. No organomegaly or masses felt. Normal bowel sounds heard. Central nervous system: Alert and oriented x2. No focal neurological deficits. Extremities: Symmetric 5 x 5 power. Skin: No rashes, lesions or ulcers Psychiatry:  Mood & affect appropriate. ' Data Reviewed:  Reviewed lab results.  Family Communication: Son updated at bedside.  Disposition: Status is: Inpatient Remains inpatient appropriate because: Severity of disease, unsafe discharge.  Planned Discharge Destination: Skilled nursing facility    Time spent: 35 minutes  Author: Sharen Hones, MD 05/15/2022 2:43 PM  For on call review [email or web address].

## 2022-05-15 NOTE — Progress Notes (Signed)
Physical Therapy Treatment Patient Details Name: Janice Hoffman MRN: [id number] DOB: 1926-01-11 Today's Date: 05/15/2022   History of Present Illness Pt. is a 88 y.o. female who was admitted to [facility] with a Left  Nondisplaced Distal Femur Fracture sustained during a fall. PMHx includes: CVA, Dementia without Behavior Disturbances, Pernicious Anemia, HTN, Herpes Zoster, HTN, COPD.    PT Comments    Pt presents to PT in bed. Reports significant pain in L knee. Requires totalA for bed mobility. Able to tolerate static sitting EOB and gentle AAROM on R LE for 10 minutes before returning to supine. Would benefit from skilled PT at SNF to address above deficits in strength, functional mobility, balance, and activity tolerance to promote optimal return to PLOF.   Recommendations for follow up therapy are one component of a multi-disciplinary discharge planning process, led by the attending physician.  Recommendations may be updated based on patient status, additional functional criteria and insurance authorization.  Follow Up Recommendations  Skilled nursing-short term rehab (<3 hours/day) Can patient physically be transported by private vehicle: No   Assistance Recommended at Discharge Frequent or constant Supervision/Assistance  Patient can return home with the following Two people to help with walking and/or transfers;Two people to help with bathing/dressing/bathroom;Assist for transportation;Help with stairs or ramp for entrance;Direct supervision/assist for medications management;Direct supervision/assist for financial management;Assistance with feeding;Assistance with Education officer, environmental (measurements PT);Wheelchair cushion (measurements PT)    Recommendations for Other Services       Precautions / Restrictions Precautions Precautions: Fall Required Braces or Orthoses: Knee Immobilizer - Left Knee Immobilizer - Left: On at all  times Restrictions Weight Bearing Restrictions: Yes LLE Weight Bearing: Touchdown weight bearing     Mobility  Bed Mobility Overal bed mobility: Needs Assistance Bed Mobility: Rolling, Supine to Sit Rolling: +2 for physical assistance, Total assist   Supine to sit: +2 for physical assistance, Total assist     General bed mobility comments: LLE very painful    Transfers                   General transfer comment: unable/unsafe to attempt secondary to TDWB status and cognitive deficits and pain    Ambulation/Gait               General Gait Details: unable/unsafe to attempt   Stairs             Wheelchair Mobility    Modified Rankin (Stroke Patients Only)       Balance Overall balance assessment: History of Falls                                          Cognition Arousal/Alertness: Awake/alert Behavior During Therapy: WFL for tasks assessed/performed Overall Cognitive Status: History of cognitive impairments - at baseline                                 General Comments: Pt AO to self, reports that the year is 2026, and responds to place w "doctor's office"        Exercises Total Joint Exercises Long Arc Quad: AAROM, Right, 20 reps    General Comments        Pertinent Vitals/Pain Pain Assessment Pain Assessment: PAINAD Breathing: occasional labored breathing, short period of hyperventilation Negative Vocalization: repeated  troubled calling out, loud moaning/groaning, crying Facial Expression: facial grimacing Body Language: tense, distressed pacing, fidgeting Consolability: no need to console PAINAD Score: 6 Pain Location: L knee; with movement. Pain Descriptors / Indicators: Aching Pain Intervention(s): Premedicated before session, Limited activity within patient's tolerance, Monitored during session, Repositioned    Home Living                          Prior Function             PT Goals (current goals can now be found in the care plan section) Acute Rehab PT Goals Patient Stated Goal: pt unable to state goal PT Goal Formulation: Patient unable to participate in goal setting Time For Goal Achievement: 05/28/22 Potential to Achieve Goals: Fair Progress towards PT goals: Progressing toward goals    Frequency    7X/week      PT Plan Current plan remains appropriate    Co-evaluation              AM-PAC PT "6 Clicks" Mobility   Outcome Measure  Help needed turning from your back to your side while in a flat bed without using bedrails?: Total Help needed moving from lying on your back to sitting on the side of a flat bed without using bedrails?: Total   Help needed standing up from a chair using your arms (e.g., wheelchair or bedside chair)?: Total Help needed to walk in hospital room?: Total Help needed climbing 3-5 steps with a railing? : Total 6 Click Score: 5    End of Session Equipment Utilized During Treatment: Left knee immobilizer;Oxygen Activity Tolerance: Patient limited by pain Patient left: in bed;with nursing/sitter in room;with bed alarm set;Other (comment) (call bell in room malfunctioning, nursing aware)   PT Visit Diagnosis: Difficulty in walking, not elsewhere classified (R26.2);History of falling (Z91.81)     Time: 4696-2952 PT Time Calculation (min) (ACUTE ONLY): 28 min  Charges:                        Glenice Laine MPH, SPT 05/15/22, 1:38 PM

## 2022-05-15 NOTE — Care Management Important Message (Signed)
Important Message  Patient Details  Name: Janice Hoffman MRN: [id number] Date of Birth: Jul 05, 1926   Medicare Important Message Given:  N/A - LOS <3 / Initial given by admissions     [name] A [name] 05/15/2022, 10:26 AM

## 2022-05-15 NOTE — Progress Notes (Signed)
Pt removed both catheters and knee immobilizer this morning. Pt is too agitated and combative at this time to have IV's replaced.

## 2022-05-16 DIAGNOSIS — J9601 Acute respiratory failure with hypoxia: Secondary | ICD-10-CM | POA: Diagnosis not present

## 2022-05-16 DIAGNOSIS — J441 Chronic obstructive pulmonary disease with (acute) exacerbation: Secondary | ICD-10-CM | POA: Diagnosis not present

## 2022-05-16 DIAGNOSIS — S72442D Displaced fracture of lower epiphysis (separation) of left femur, subsequent encounter for closed fracture with routine healing: Secondary | ICD-10-CM | POA: Diagnosis not present

## 2022-05-16 LAB — BASIC METABOLIC PANEL
Anion gap: 8 (ref 5–15)
BUN: 31 mg/dL — ABNORMAL HIGH (ref 8–23)
CO2: 28 mmol/L (ref 22–32)
Calcium: 8.9 mg/dL (ref 8.9–10.3)
Chloride: 103 mmol/L (ref 98–111)
Creatinine, Ser: 0.64 mg/dL (ref 0.44–1.00)
GFR, Estimated: >60 mL/min (ref >60)
Glucose, Bld: 99 mg/dL (ref 70–99)
Potassium: 4 mmol/L (ref 3.5–5.1)
Sodium: 139 mmol/L (ref 135–145)

## 2022-05-16 LAB — CBC
HCT: 31.4 % — ABNORMAL LOW (ref 36.0–46.0)
Hemoglobin: 10.1 g/dL — ABNORMAL LOW (ref 12.0–15.0)
MCH: 28.5 pg (ref 26.0–34.0)
MCHC: 32.2 g/dL (ref 30.0–36.0)
MCV: 88.7 fL (ref 80.0–100.0)
Platelets: 235 10*3/uL (ref 150–400)
RBC: 3.54 MIL/uL — ABNORMAL LOW (ref 3.87–5.11)
RDW: 14.7 % (ref 11.5–15.5)
WBC: 10.4 10*3/uL (ref 4.0–10.5)
nRBC: 0 % (ref 0.0–0.2)

## 2022-05-16 LAB — IRON AND TIBC
Iron: 20 ug/dL — ABNORMAL LOW (ref 28–170)
Saturation Ratios: 6 % — ABNORMAL LOW (ref 10.4–31.8)
TIBC: 339 ug/dL (ref 250–450)
UIBC: 319 ug/dL

## 2022-05-16 LAB — VITAMIN B12: Vitamin B-12: 113 pg/mL — ABNORMAL LOW (ref 180–914)

## 2022-05-16 LAB — PROCALCITONIN: Procalcitonin: <0.1 ng/mL

## 2022-05-16 MED ORDER — IPRATROPIUM-ALBUTEROL 0.5-2.5 (3) MG/3ML IN SOLN
3.0000 mL | Freq: Two times a day (BID) | RESPIRATORY_TRACT | Status: DC
  Administered 2022-05-16 – 2022-05-18 (×4): 3 mL via RESPIRATORY_TRACT
  Filled 2022-05-16 (×4): qty 3

## 2022-05-16 MED ORDER — POLYSACCHARIDE IRON COMPLEX 150 MG PO CAPS
150.0000 mg | ORAL_CAPSULE | Freq: Every day | ORAL | Status: DC
  Filled 2022-05-16 (×2): qty 1

## 2022-05-16 MED ORDER — QUETIAPINE FUMARATE 25 MG PO TABS
25.0000 mg | ORAL_TABLET | Freq: Every day | ORAL | Status: DC
  Administered 2022-05-16: 25 mg via ORAL
  Filled 2022-05-16: qty 1

## 2022-05-16 MED ORDER — CYANOCOBALAMIN 1000 MCG/ML IJ SOLN
1000.0000 ug | Freq: Once | INTRAMUSCULAR | Status: DC
  Filled 2022-05-16: qty 1

## 2022-05-16 MED ORDER — ENOXAPARIN SODIUM 40 MG/0.4ML IJ SOSY
40.0000 mg | PREFILLED_SYRINGE | INTRAMUSCULAR | Status: DC
  Administered 2022-05-16 – 2022-05-17 (×2): 40 mg via SUBCUTANEOUS
  Filled 2022-05-16 (×2): qty 0.4

## 2022-05-16 MED ORDER — CYANOCOBALAMIN 1000 MCG/ML IJ SOLN
1000.0000 ug | Freq: Every day | INTRAMUSCULAR | Status: DC
Start: 2022-05-16 — End: 2022-05-18
  Administered 2022-05-16 – 2022-05-18 (×3): 1000 ug via INTRAMUSCULAR
  Filled 2022-05-16 (×3): qty 1

## 2022-05-16 NOTE — Plan of Care (Signed)

## 2022-05-16 NOTE — Progress Notes (Signed)
Occupational Therapy Treatment Patient Details Name: Janice Hoffman MRN: [id number] DOB: 07-09-1926 Today's Date: 05/16/2022   History of present illness Pt. is a 88 y.o. female who was admitted to [facility] with a Left  Nondisplaced Distal Femur Fracture sustained during a fall. PMHx includes: CVA, Dementia without Behavior Disturbances, Pernicious Anemia, HTN, Herpes Zoster, HTN, COPD.   OT comments  Chart reviewed, RN cleared pt for participation in OT tx session. Co tx completed with PT on this date. Tx session targeted improving activity tolerance for improved ADL participation. Improvements noted in SPT to bedside commode, then bedside chair with MOD A +2 with RW with step by step verbal and tactile cues for Wbing technique. MAX A required for peri care with RW in STS. MOD A required for grooming tasks with step by step vcs. Improvements noted in activity tolerance and endurance on this date, discharge recommendation remains appropriate. OT will follow acutely.    Recommendations for follow up therapy are one component of a multi-disciplinary discharge planning process, led by the attending physician.  Recommendations may be updated based on patient status, additional functional criteria and insurance authorization.    Follow Up Recommendations  Skilled nursing-short term rehab (<3 hours/day)    Assistance Recommended at Discharge Frequent or constant Supervision/Assistance  Patient can return home with the following  A lot of help with bathing/dressing/bathroom;Assist for transportation;Direct supervision/assist for medications management;Two people to help with walking and/or transfers;Assistance with cooking/housework   Equipment Recommendations  Other (comment) (per next venue of care)    Recommendations for Other Services      Precautions / Restrictions Precautions Precautions: Fall Required Braces or Orthoses: Knee Immobilizer - Left Knee Immobilizer - Left: On at all  times Restrictions Weight Bearing Restrictions: Yes LLE Weight Bearing: Touchdown weight bearing       Mobility Bed Mobility Overal bed mobility: Needs Assistance Bed Mobility: Supine to Sit Rolling: Max assist, +2 for physical assistance              Transfers Overall transfer level: Needs assistance Equipment used: Rolling walker (2 wheels) Transfers: Sit to/from Stand, Bed to chair/wheelchair/BSC Sit to Stand: Mod assist, +2 physical assistance Stand pivot transfers: Mod assist, +2 physical assistance               Balance Overall balance assessment: Needs assistance, History of Falls Sitting-balance support: Feet supported Sitting balance-Leahy Scale: Poor     Standing balance support: Bilateral upper extremity supported Standing balance-Leahy Scale: Poor                             ADL either performed or assessed with clinical judgement   ADL Overall ADL's : Needs assistance/impaired Eating/Feeding: Moderate assistance;Sitting   Grooming: Moderate assistance;Cueing for sequencing;Sitting;Wash/dry hands;Wash/dry face               Lower Body Dressing: Maximal assistance;Sit to/from stand Lower Body Dressing Details (indicate cue type and reason): underwear Toilet Transfer: BSC/3in1;Stand-pivot;Cueing for safety;Cueing for sequencing;+2 for safety/equipment;+2 for physical assistance;Moderate assistance Toilet Transfer Details (indicate cue type and reason): MOD-MAX A SPT to BSC> then to bedside chair with step by step verbal and tactile cues for adherance to weight bearing precautions, safety Toileting- Clothing Manipulation and Hygiene: Maximal assistance;Sit to/from stand;+2 for physical assistance;+2 for safety/equipment Toileting - Clothing Manipulation Details (indicate cue type and reason): peri care following BM            Extremity/Trunk  Assessment              Vision       Perception     Praxis      Cognition  Arousal/Alertness: Awake/alert Behavior During Therapy: WFL for tasks assessed/performed Overall Cognitive Status: History of cognitive impairments - at baseline                                          Exercises      Shoulder Instructions       General Comments      Pertinent Vitals/ Pain       Pain Assessment Pain Assessment: Faces Faces Pain Scale: Hurts even more Pain Location: L knee Pain Descriptors / Indicators: Grimacing, Guarding Pain Intervention(s): Limited activity within patient's tolerance, Monitored during session, Repositioned  Home Living                                          Prior Functioning/Environment              Frequency  Min 2X/week        Progress Toward Goals  OT Goals(current goals can now be found in the care plan section)  Progress towards OT goals: Progressing toward goals  Acute Rehab OT Goals OT Goal Formulation: With patient  Plan Discharge plan remains appropriate    Co-evaluation      Reason for Co-Treatment: For patient/therapist safety PT goals addressed during session: Mobility/safety with mobility OT goals addressed during session: ADL's and self-care      AM-PAC OT "6 Clicks" Daily Activity     Outcome Measure   Help from another person eating meals?: A Little Help from another person taking care of personal grooming?: A Lot Help from another person toileting, which includes using toliet, bedpan, or urinal?: A Lot Help from another person bathing (including washing, rinsing, drying)?: A Lot Help from another person to put on and taking off regular upper body clothing?: A Lot Help from another person to put on and taking off regular lower body clothing?: A Lot 6 Click Score: 13    End of Session    OT Visit Diagnosis: Muscle weakness (generalized) (M62.81);Unsteadiness on feet (R26.81);Other abnormalities of gait and mobility (R26.89)   Activity Tolerance Patient  tolerated treatment well   Patient Left in chair;with call bell/phone within reach;with bed alarm set   Nurse Communication Mobility status        Time: 6045-4098 OT Time Calculation (min): 25 min  Charges: OT General Charges $OT Visit: 1 Visit OT Treatments $Self Care/Home Management : 8-22 mins  Shanon Payor, OTD OTR/L  05/16/22, 11:57 AM

## 2022-05-16 NOTE — TOC Progression Note (Signed)
Transition of Care [facility]) - Progression Note    Patient Details  Name: Janice Hoffman MRN: [id number] Date of Birth: 1926/08/25  Transition of Care [facility]) CM/SW [place], RN Phone Number: 05/16/2022, 10:16 AM  Clinical Narrative:    Spoke with son Dr. Grant Fontana, he explained that the patient has been recommended to go to SNF twice and was denied by Insurance, he feels that if Insurance would have approved her to go to SNF that she may not have fallen and obtained a fracture,  He stated that if it is denied yet again he will appeal to all levels needed. I called [facility] to request Ins approval to go to [facility] [place] and requested EMS approval as well  Spoke to [place] at [employer] it will be sent for review and they will let us know the decision       Expected Discharge Plan and Services                                                 Social Determinants of Health (SDOH) Interventions    Readmission Risk Interventions     No data to display

## 2022-05-16 NOTE — Progress Notes (Signed)
Physical Therapy Treatment Patient Details Name: Janice Hoffman MRN: [id number] DOB: Mar 31, 1926 Today's Date: 05/16/2022   History of Present Illness Pt. is a 88 y.o. female who was admitted to [facility] with a Left  Nondisplaced Distal Femur Fracture sustained during a fall. PMHx includes: CVA, Dementia without Behavior Disturbances, Pernicious Anemia, HTN, Herpes Zoster, HTN, COPD.    PT Comments    Co-tx with OT for mobility and improved outcomes.  1 unit billed each per protocol  Pt in bed.  KI had been removed and was donned prior to mobility and left on after session.  She is assisted to EOB with max a x 2.  Needs min assist to maintain sitting balance and is not safe to be left unattended in sitting.  She is able to stand to RW with mod a x 2 and maintain NWB LLE.  Sits when fatigued and a commode is obtained.  She is able to transfer with heavy mod a x 2 to commode.  Does not take any true steps but when given time she is able to pivot on her R LE.  Medium formed BM and voided on commode.  Total assist for care.  She then stands and transfers with same assist to recliner at bedside.  OT interventions for ADL's.  Pt is progressing towards goals and has made progress from yesterday.  SNF remains appropriate upon discharge.  Daughter in law in and stated she was able to get up to chair on her own using RW prior to fall.  Pt is not at baseline.  She does engage and work hard during therapy session.     Recommendations for follow up therapy are one component of a multi-disciplinary discharge planning process, led by the attending physician.  Recommendations may be updated based on patient status, additional functional criteria and insurance authorization.  Follow Up Recommendations  Skilled nursing-short term rehab (<3 hours/day)     Assistance Recommended at Discharge Frequent or constant Supervision/Assistance  Patient can return home with the following Two people to help with walking and/or  transfers;Two people to help with bathing/dressing/bathroom;Assist for transportation;Help with stairs or ramp for entrance;Direct supervision/assist for medications management;Direct supervision/assist for financial management;Assistance with feeding;Assistance with Education officer, environmental (measurements PT);Wheelchair cushion (measurements PT)    Recommendations for Other Services       Precautions / Restrictions Precautions Precautions: Fall Required Braces or Orthoses: Knee Immobilizer - Left Knee Immobilizer - Left: On at all times Restrictions Weight Bearing Restrictions: Yes LLE Weight Bearing: Touchdown weight bearing     Mobility  Bed Mobility Overal bed mobility: Needs Assistance Bed Mobility: Supine to Sit Rolling: Max assist, +2 for physical assistance              Transfers Overall transfer level: Needs assistance Equipment used: Rolling walker (2 wheels) Transfers: Sit to/from Stand, Bed to chair/wheelchair/BSC Sit to Stand: Mod assist, +2 physical assistance Stand pivot transfers: Mod assist, +2 physical assistance         General transfer comment: +2 - no true steps but if given time she can pivot on R LE    Ambulation/Gait               General Gait Details: unable/unsafe to attempt   Stairs             Wheelchair Mobility    Modified Rankin (Stroke Patients Only)       Balance Overall balance assessment: Needs assistance, History  of Falls Sitting-balance support: Feet supported Sitting balance-Leahy Scale: Poor Sitting balance - Comments: hands on assist   Standing balance support: Bilateral upper extremity supported Standing balance-Leahy Scale: Poor Standing balance comment: +2 assist needed for standing, transfers and care                            Cognition Arousal/Alertness: Awake/alert Behavior During Therapy: WFL for tasks assessed/performed Overall Cognitive  Status: Within Functional Limits for tasks assessed                                          Exercises Other Exercises Other Exercises: to commode to void/BM    General Comments        Pertinent Vitals/Pain Pain Assessment Pain Assessment: Faces Faces Pain Scale: Hurts even more Pain Location: L knee; with movement. Pain Descriptors / Indicators: Sore, Aching Pain Intervention(s): Limited activity within patient's tolerance, Monitored during session, Repositioned    Home Living                          Prior Function            PT Goals (current goals can now be found in the care plan section) Progress towards PT goals: Progressing toward goals    Frequency    7X/week      PT Plan Current plan remains appropriate    Co-evaluation PT/OT/SLP Co-Evaluation/Treatment: Yes Reason for Co-Treatment: For patient/therapist safety PT goals addressed during session: Mobility/safety with mobility OT goals addressed during session: ADL's and self-care      AM-PAC PT "6 Clicks" Mobility   Outcome Measure  Help needed turning from your back to your side while in a flat bed without using bedrails?: Total Help needed moving from lying on your back to sitting on the side of a flat bed without using bedrails?: Total Help needed moving to and from a bed to a chair (including a wheelchair)?: A Lot Help needed standing up from a chair using your arms (e.g., wheelchair or bedside chair)?: A Lot Help needed to walk in hospital room?: Total Help needed climbing 3-5 steps with a railing? : Total 6 Click Score: 8    End of Session Equipment Utilized During Treatment: Left knee immobilizer Activity Tolerance: Patient limited by fatigue;Patient limited by pain Patient left: in chair;with call bell/phone within reach;with chair alarm set;with family/visitor present Nurse Communication: Mobility status;Other (comment) PT Visit Diagnosis: Difficulty in  walking, not elsewhere classified (R26.2);History of falling (Z91.81)     Time: 5456-2563 PT Time Calculation (min) (ACUTE ONLY): 25 min  Charges:  $Therapeutic Activity: 8-22 mins                   Chesley Noon, PTA 05/16/22, 11:34 AM

## 2022-05-16 NOTE — Plan of Care (Signed)
  Problem: Coping: Goal: Level of anxiety will decrease Outcome: Progressing   Problem: Elimination: Goal: Will not experience complications related to bowel motility Outcome: Progressing   Problem: Pain Managment: Goal: General experience of comfort will improve Outcome: Progressing   Problem: Safety: Goal: Ability to remain free from injury will improve Outcome: Progressing   

## 2022-05-16 NOTE — Progress Notes (Signed)
  Progress Note   Patient: Janice Hoffman ZDG:[id number] DOB: 08/18/1926 DOA: 05/12/2022     4 DOS: the patient was seen and examined on 05/16/2022   Brief hospital course: 88 year old female with past medical history of COPD, dementia, history of CVA, hypertension, pernicious anemia.  She presented to the hospital with a fall and found to have a closed fracture of the distal femur.  The patient had an wheezing on 9/4 and 05/14/2022.  Started nebulizer treatments on 05/13/2022.  Started Solu-Medrol 05/14/2022.  Patient also had hypoxia on 07/14/2022 and started on oxygen.  Assessment and Plan: Closed fracture of left distal femur (HCC) Leg immobilizer.  As needed Tylenol and tramadol.  Lidoderm patch.  Patient pain is is under control.  COPD with acute exacerbation (HCC) Acute respiratory failure with hypoxia (HCC) Aspiration pneumonia (HCC) Procalcitonin level less than 0.1, no bacterial infection, hold off antibiotics.  Continue oral steroids.  Wean off oxygen.   History of CVA (cerebrovascular accident) Continue Plavix.    Essential hypertension Continue Norvasc and Metolprolol    Dementia with behavioral disturbance (HCC) Insomnia. Patient was not able to sleep last night, was agitated, trying to climb off the bed.  We will start Seroquel.   Pernicious anemia Iron deficient anemia. On monthly B12 injections, current lab showed B12 level 113, iron 20, saturation rate 6%. Patient will be given oral iron. Due to significantly low B12 level, I will give her B12 injection x4 then followed by monthly.    AKI (acute kidney injury) ([place])  Resolved.         Subjective:  She did not sleep well last night, she is sleepy today and confused. No shortness of breath.  Physical Exam: Vitals:   05/15/22 2129 05/16/22 0429 05/16/22 0730 05/16/22 0859  BP: 107/79 (!) 170/70  (!) 148/80  Pulse: (!) 108 75  81  Resp: '20 20  16  '$ Temp: 98.8 F (37.1 C) 98.1 F (36.7 C)  98.2 F (36.8 C)   TempSrc:      SpO2: 97% 100% 98% 94%  Weight:      Height:       General exam: Appears calm and comfortable  Respiratory system: Clear to auscultation. Respiratory effort normal. Cardiovascular system: S1 & S2 heard, RRR. No JVD, murmurs, rubs, gallops or clicks. No pedal edema. Gastrointestinal system: Abdomen is nondistended, soft and nontender. No organomegaly or masses felt. Normal bowel sounds heard. Central nervous system: Alert and oriented x1. No focal neurological deficits. Extremities: Symmetric 5 x 5 power. Skin: No rashes, lesions or ulcers   Data Reviewed:  Lab results reviewed.  Family Communication: Son updated.  Disposition: Status is: Inpatient Remains inpatient appropriate because: Severity of disease, pending placement.  Planned Discharge Destination: Skilled nursing facility    Time spent: 35 minutes  Author: Sharen Hones, MD 05/16/2022 1:36 PM  For on call review [email or web address].

## 2022-05-16 NOTE — Care Management Important Message (Signed)
Important Message  Patient Details  Name: Janice Hoffman MRN: [id number] Date of Birth: 05/04/1926   Medicare Important Message Given:  N/A - LOS <3 / Initial given by admissions     [name] A Shenekia Riess 05/16/2022, 11:35 AM

## 2022-05-17 DIAGNOSIS — S72442D Displaced fracture of lower epiphysis (separation) of left femur, subsequent encounter for closed fracture with routine healing: Secondary | ICD-10-CM | POA: Diagnosis not present

## 2022-05-17 DIAGNOSIS — J9601 Acute respiratory failure with hypoxia: Secondary | ICD-10-CM | POA: Diagnosis not present

## 2022-05-17 DIAGNOSIS — J441 Chronic obstructive pulmonary disease with (acute) exacerbation: Secondary | ICD-10-CM | POA: Diagnosis not present

## 2022-05-17 LAB — CBC
HCT: 30.1 % — ABNORMAL LOW (ref 36.0–46.0)
Hemoglobin: 9.6 g/dL — ABNORMAL LOW (ref 12.0–15.0)
MCH: 28.1 pg (ref 26.0–34.0)
MCHC: 31.9 g/dL (ref 30.0–36.0)
MCV: 88 fL (ref 80.0–100.0)
Platelets: 230 10*3/uL (ref 150–400)
RBC: 3.42 MIL/uL — ABNORMAL LOW (ref 3.87–5.11)
RDW: 15.1 % (ref 11.5–15.5)
WBC: 8.7 10*3/uL (ref 4.0–10.5)
nRBC: 0 % (ref 0.0–0.2)

## 2022-05-17 MED ORDER — IRON SUCROSE 20 MG/ML IV SOLN
300.0000 mg | Freq: Once | INTRAVENOUS | Status: AC
  Administered 2022-05-17: 300 mg via INTRAVENOUS
  Filled 2022-05-17: qty 300

## 2022-05-17 MED ORDER — PREDNISONE 20 MG PO TABS
20.0000 mg | ORAL_TABLET | Freq: Every day | ORAL | Status: DC
  Administered 2022-05-18: 20 mg via ORAL
  Filled 2022-05-17: qty 1

## 2022-05-17 NOTE — TOC Progression Note (Addendum)
Transition of Care [facility]) - Progression Note    Patient Details  Name: Janice Hoffman MRN: [id number] Date of Birth: 10/11/1925  Transition [facility]) CM/SW [place], RN Phone Number: 05/17/2022, 12:14 PM  Clinical Narrative:    Katheren Shams called and provided auth approval to go to [facility], Auth number [id number], EMS approved auth number [id number]  Spoke with [name] the son and notified him, we anticipate DC tomorrow   Expected Discharge Plan: Skilled Nursing Facility Barriers to Discharge: Insurance Authorization  Expected Discharge Plan and Services Expected Discharge Plan: [place]                                               Social Determinants of Health (SDOH) Interventions    Readmission Risk Interventions     No data to display

## 2022-05-17 NOTE — Progress Notes (Signed)
Physical Therapy Treatment Patient Details Name: MONIFAH FREEHLING MRN: [id number] DOB: 13-Dec-1925 Today's Date: 05/17/2022   History of Present Illness Pt. is a 88 y.o. female who was admitted to [facility] with a Left  Nondisplaced Distal Femur Fracture sustained during a fall. PMHx includes: CVA, Dementia without Behavior Disturbances, Pernicious Anemia, HTN, Herpes Zoster, HTN, COPD.    PT Comments    Pt resting in bed, son at bedside. Pt still very lethargic (? medicine induced), Pt however agreeable to sitting EOB with MaxA to transition slowly from supine to sit. Once sitting EOB, L LE supported by therapist, pt with slight Right lateral lean yet able to maintain balance. Pt tolerated ~3 minutes before fatiguing and requiring assistance to return to supine and position to comfort. Discussed need for L knee immobilizer through secure chat with Dr. 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 Therefore, KI does not need to be applied unless Ortho feels she would need some type of support ex. Hinged brace. Pt is also TDWB.  Will continue to focus on goals, pt limited today due to possible medication sensitivity, otherwise, she is very motivated and will do well progressing at SNF.    Recommendations for follow up therapy are one component of a multi-disciplinary discharge planning process, led by the attending physician.  Recommendations may be updated based on patient status, additional functional criteria and insurance authorization.  Follow Up Recommendations  Skilled nursing-short term rehab (<3 hours/day) Can patient physically be transported by private vehicle: No   Assistance Recommended at Discharge Frequent or constant Supervision/Assistance  Patient can return home with the following Two people to help with walking and/or transfers;Two people to help with bathing/dressing/bathroom;Assist for  transportation;Help with stairs or ramp for entrance;Direct supervision/assist for medications management;Direct supervision/assist for financial management;Assistance with feeding;Assistance with Education officer, environmental (measurements PT);Wheelchair cushion (measurements PT)    Recommendations for Other Services       Precautions / Restrictions Precautions Precautions: Fall Required Braces or Orthoses: Knee Immobilizer - Left Knee Immobilizer - Left: Other (comment) (Dr. Roosevelt Locks stated through secure chat that we can use our judgement on utilizing L KI due to chronic knee flexion contracture) Restrictions Weight Bearing Restrictions: Yes LLE Weight Bearing: Touchdown weight bearing     Mobility  Bed Mobility Overal bed mobility: Needs Assistance Bed Mobility: Supine to Sit Rolling: Mod assist (Pt assisted with scooting to EOB)   Supine to sit: Max assist          Transfers                   General transfer comment: deferred    Ambulation/Gait                   Stairs             Wheelchair Mobility    Modified Rankin (Stroke Patients Only)       Balance Overall balance assessment: Needs assistance, History of Falls Sitting-balance support: Feet supported Sitting balance-Leahy Scale: Fair Sitting balance - Comments: hands on assist Postural control: Right lateral lean                                  Cognition Arousal/Alertness: Lethargic Behavior During Therapy: Flat affect Overall Cognitive Status: History of cognitive impairments -  at baseline                                 General Comments: Pt AO to self, reports that the year is 2026, and responds to place w "doctor's office"        Exercises      General Comments General comments (skin integrity, edema, etc.):  (Pt very sensative with light palpation of L lateral knee)      Pertinent Vitals/Pain Pain  Assessment Pain Assessment: Faces Faces Pain Scale: Hurts even more Pain Location:  (L lateral knee/proximal fibula) Pain Descriptors / Indicators: Grimacing, Crying, Shooting Pain Intervention(s): Monitored during session, Patient requesting pain meds-RN notified, Repositioned    Home Living                          Prior Function            PT Goals (current goals can now be found in the care plan section) Acute Rehab PT Goals Patient Stated Goal: pt unable to state goal    Frequency    7X/week      PT Plan Current plan remains appropriate    Co-evaluation              AM-PAC PT "6 Clicks" Mobility   Outcome Measure  Help needed turning from your back to your side while in a flat bed without using bedrails?: Total Help needed moving from lying on your back to sitting on the side of a flat bed without using bedrails?: Total Help needed moving to and from a bed to a chair (including a wheelchair)?: A Lot Help needed standing up from a chair using your arms (e.g., wheelchair or bedside chair)?: A Lot Help needed to walk in hospital room?: Total Help needed climbing 3-5 steps with a railing? : Total 6 Click Score: 8    End of Session   Activity Tolerance: Patient limited by fatigue Patient left: in bed;with call bell/phone within reach;with bed alarm set;with family/visitor present Nurse Communication: Mobility status;Other (comment) (KI to be used at [employer]) PT Visit Diagnosis: Difficulty in walking, not elsewhere classified (R26.2);History of falling (Z91.81)     Time: 1530-1600 PT Time Calculation (min) (ACUTE ONLY): 30 min  Charges:  $Therapeutic Activity: 23-37 mins                    Mikel Cella, PTA    Josie Dixon 05/17/2022, 4:22 PM

## 2022-05-17 NOTE — TOC Progression Note (Signed)
Transition of Care [facility]) - Progression Note    Patient Details  Name: Janice Hoffman MRN: [id number] Date of Birth: [birth year]  Transition of Care [facility]) CM/SW [place], RN Phone Number: 05/17/2022, 9:59 AM  Clinical Narrative:     Ins is still pending and with the medical director of [facility] for review       Expected Discharge Plan and Services                                                 Social Determinants of Health (SDOH) Interventions    Readmission Risk Interventions     No data to display

## 2022-05-17 NOTE — Progress Notes (Signed)
  Progress Note   Patient: Janice Hoffman [id number]:[id number] DOB: [birth year] DOA: 05/12/2022     5 DOS: the patient was seen and examined on 05/17/2022   Brief hospital course: 88 year old female with past medical history of COPD, dementia, history of CVA, hypertension, pernicious anemia.  She presented to the hospital with a fall and found to have a closed fracture of the distal femur.  The patient had an wheezing on 9/4 and 05/14/2022.  Started nebulizer treatments on 05/13/2022.  Started Solu-Medrol 05/14/2022.  Patient also had hypoxia on 07/14/2022 and started on oxygen.  Assessment and Plan: Closed fracture of left distal femur (HCC) Leg immobilizer.  As needed Tylenol and tramadol.  Lidoderm patch.  Discussed with the son, we will continue lower dose tramadol.    Dementia with behavioral disturbance (HCC) Insomnia. Patient was agitated at nighttime, started 25 mg of Seroquel last night, patient is pretty sleepy today.  Per son's request, will discontinue Seroquel.  Continue to monitor.  Patient cannot be discharged home today as patient become more sleepy.  COPD with acute exacerbation (HCC) Acute respiratory failure with hypoxia (HCC) Aspiration pneumonia (HCC) Procalcitonin level less than 0.1, no bacterial infection, hold off antibiotics.  Continue oral steroids, will reduce prednisone to 20 mg daily.   History of CVA (cerebrovascular accident) Continue Plavix.    Essential hypertension Continue Norvasc and Metolprolol     Pernicious anemia Iron deficient anemia. On monthly B12 injections, current lab showed B12 level 113, iron 20, saturation rate 6%. Patient is receiving B12 injection x4 doses.  Discussed with patient and son, patient appears to have significant amount of his option, iron level is low after taking oral iron.  Son is okay to give IV iron.     AKI (acute kidney injury) ([place])  Resolved.      Subjective:  Discussed with the nurse, patient slept well last night.   However, she became more sleepy this morning.  Per son's request, will discontinue Seroquel. No shortness of breath  Physical Exam: Vitals:   05/16/22 1644 05/16/22 2030 05/17/22 0722 05/17/22 0741  BP: (!) 166/85   (!) 171/96  Pulse: 100   95  Resp: 16   16  Temp: 98 F (36.7 C)   (!) 97.3 F (36.3 C)  TempSrc:      SpO2: 98% 94% 95% 97%  Weight:      Height:       General exam: Appears calm and comfortable  Respiratory system: Clear to auscultation. Respiratory effort normal. Cardiovascular system: S1 & S2 heard, RRR. No JVD, murmurs, rubs, gallops or clicks. No pedal edema. Gastrointestinal system: Abdomen is nondistended, soft and nontender. No organomegaly or masses felt. Normal bowel sounds heard. Central nervous system: Drowsy and confused. Extremities: Symmetric  Skin: No rashes, lesions or ulcers  Data Reviewed:  Lab results reviewed  Family Communication: Son updated at bedside.  Disposition: Status is: Inpatient Remains inpatient appropriate because: Severity of disease, not ready to discharge.  Planned Discharge Destination: Skilled nursing facility    Time spent: 55 minutes  Author: Sharen Hones, MD 05/17/2022 10:01 AM  For on call review [email or web address].

## 2022-05-17 NOTE — Progress Notes (Signed)
OT Cancellation Note  Patient Details Name: Janice Hoffman MRN: [id number] DOB: [birth year]   Cancelled Treatment:    Reason Eval/Treat Not Completed: Patient's level of consciousness;Fatigue/lethargy limiting ability to participate. Pt given medication last night to sleep and continues to be sleepy with difficulty with eye opening. Will re-attempt OT tx at later date/time as pt is appropriate.   Ardeth Perfect., MPH, MS, OTR/L ascom [phone] 05/17/22, 11:24 AM

## 2022-05-17 NOTE — Progress Notes (Signed)
PT Cancellation Note  Patient Details Name: Janice Hoffman MRN: [id number] DOB: 02-03-1926   Cancelled Treatment:    Reason Eval/Treat Not Completed: Fatigue/lethargy limiting ability to participate;Patient's level of consciousness. Pt given medication last night to sleep and continues to be sleepy with difficulty with eye opening. PT will attempt to follow up later this afternoon.   Spoke with family. Reports pt has c/o s/s consistent with nerve pain along the fibular nerve. Son states that this pain has been limiting to her. Recommended conversation with MD regarding potential for medical intervention of nerve pain if possible.    [name] A Amiel Mccaffrey 05/17/2022, 10:52 AM

## 2022-05-17 NOTE — TOC Progression Note (Signed)
Transition of Care [facility]) - Progression Note    Patient Details  Name: Janice Hoffman MRN: [id number] Date of Birth: 1926/05/26  Transition of Care [facility]) CM/SW [place], RN Phone Number: 05/17/2022, 11:40 AM  Clinical Narrative:     Spoke with Dr Rock Nephew the patient's son and let him know that Ins is still pending  Expected Discharge Plan: Skilled Nursing Facility Barriers to Discharge: Insurance Authorization  Expected Discharge Plan and Services Expected Discharge Plan: [place]                                               Social Determinants of Health (SDOH) Interventions    Readmission Risk Interventions     No data to display

## 2022-05-18 DIAGNOSIS — S72442D Displaced fracture of lower epiphysis (separation) of left femur, subsequent encounter for closed fracture with routine healing: Secondary | ICD-10-CM | POA: Diagnosis not present

## 2022-05-18 DIAGNOSIS — J9601 Acute respiratory failure with hypoxia: Secondary | ICD-10-CM | POA: Diagnosis not present

## 2022-05-18 DIAGNOSIS — J441 Chronic obstructive pulmonary disease with (acute) exacerbation: Secondary | ICD-10-CM | POA: Diagnosis not present

## 2022-05-18 LAB — CBC
HCT: 31.6 % — ABNORMAL LOW (ref 36.0–46.0)
Hemoglobin: 10.1 g/dL — ABNORMAL LOW (ref 12.0–15.0)
MCH: 27.8 pg (ref 26.0–34.0)
MCHC: 32 g/dL (ref 30.0–36.0)
MCV: 87.1 fL (ref 80.0–100.0)
Platelets: 268 10*3/uL (ref 150–400)
RBC: 3.63 MIL/uL — ABNORMAL LOW (ref 3.87–5.11)
RDW: 15.1 % (ref 11.5–15.5)
WBC: 9.2 10*3/uL (ref 4.0–10.5)
nRBC: 0 % (ref 0.0–0.2)

## 2022-05-18 MED ORDER — TRAMADOL HCL 50 MG PO TABS: 25.0000 mg | ORAL_TABLET | Freq: Four times a day (QID) | ORAL | 0 refills | Status: DC | PRN

## 2022-05-18 MED ORDER — LIDOCAINE 5 % EX PTCH: 1.0000 | MEDICATED_PATCH | CUTANEOUS | 0 refills | Status: DC

## 2022-05-18 MED ORDER — ENOXAPARIN SODIUM 40 MG/0.4ML IJ SOSY: 40.0000 mg | PREFILLED_SYRINGE | INTRAMUSCULAR | Status: DC

## 2022-05-18 MED ORDER — PREDNISONE 20 MG PO TABS: 10.0000 mg | ORAL_TABLET | Freq: Every day | ORAL | 0 refills | Status: AC

## 2022-05-18 MED ORDER — GABAPENTIN 100 MG PO CAPS: 100.0000 mg | ORAL_CAPSULE | Freq: Every day | ORAL | 0 refills | Status: DC

## 2022-05-18 MED ORDER — CLOPIDOGREL BISULFATE 75 MG PO TABS: 75.0000 mg | ORAL_TABLET | Freq: Every day | ORAL | Status: DC

## 2022-05-18 NOTE — Discharge Summary (Addendum)
Physician Discharge Summary   Patient: Janice Hoffman MRN: [id number] DOB: [birth year]  Admit date:     05/12/2022  Discharge date: 05/18/22  Discharge Physician: Sharen Hones   PCP: Baxter Hire, MD   Recommendations at discharge:   Follow-up with PCP in the nursing home in 1 week. Follow-up with orthopedics Dr. Sharlet Salina in 1 week.  Discharge Diagnoses: Active Problems:   Closed fracture of left distal femur (HCC)   COPD with acute exacerbation (HCC)   Acute respiratory failure with hypoxia (HCC)   Aspiration pneumonia (HCC)   History of CVA (cerebrovascular accident)   Essential hypertension   Dementia without behavioral disturbance (HCC)   Pernicious anemia   Osteoporosis, post-menopausal   Femur fracture, left (HCC)   Drop in hemoglobin   AKI (acute kidney injury) ([place])  Principal Problem (Resolved):   Closed displaced fracture of distal epiphysis of left femur with routine healing  Hospital Course: 88 year old female with past medical history of COPD, dementia, history of CVA, hypertension, pernicious anemia.  She presented to the hospital with a fall and found to have a closed fracture of the distal femur.  The patient had an wheezing on 9/4 and 05/14/2022.  Started nebulizer treatments on 05/13/2022.  Started Solu-Medrol 05/14/2022.  Patient also had hypoxia on 07/14/2022 and started on oxygen. Patient condition so far had improved, she is off oxygen, she has a bed available in nursing home, she will be discharged today. Patient has not been sleeping well in the hospital, she did not respond well with lower dose Seroquel, her son, Dr. Rock Nephew, suggested using lower dose Neurontin every evening. Patient was also off knee immobilizer due to increased pain.  Assessment and Plan: Closed fracture of left distal femur (HCC) Off leg immobilizer.  As needed Tylenol and tramadol.  Lidoderm patch.  Discussed with the son, we will continue lower dose tramadol, will also try a lower dose  Neurontin at 100 mg every evening.     Dementia with behavioral disturbance (HCC) Insomnia. Did not respond well to lower dose Seroquel, currently we will try Neurontin as patient has some left lower extremity nerve pain.   COPD with acute exacerbation (HCC) Acute respiratory failure with hypoxia (HCC) Aspiration pneumonia (HCC) Procalcitonin level less than 0.1, no bacterial infection, no need for antibiotics.  She was started on steroids, will quickly wean it down to 10 mg prednisone as condition had improved.  Currently she is off oxygen.   history of CVA (cerebrovascular accident) Continue Plavix.    Essential hypertension Continue Norvasc and Metolprolol     Pernicious anemia Iron deficient anemia. On monthly B12 injections, current lab showed B12 level 113, iron 20, saturation rate 6%. Patient received IV iron, also im injection of B12 x3.  will resume B12 shot every month from this point. Patient does not seem to absorb her oral iron, I have ordered a follow-up visit with oncology/hematology for future iron infusion.     AKI (acute kidney injury) ([place])  Resolved.       Consultants: Orthopedics Procedures performed: None  Disposition: Skilled nursing facility Diet recommendation:  Discharge Diet Orders (From admission, onward)     Start     Ordered   05/18/22 0000  Diet - low sodium heart healthy        05/18/22 0957           Cardiac diet DISCHARGE MEDICATION: Allergies as of 05/18/2022       Reactions   Aspirin Shortness Of  Breath   Nsaids Shortness Of Breath, Anaphylaxis   Throat swelling, shortness of breath   Sulfa Antibiotics Other (See Comments)   Stomach pain Stomach pain   Ceftriaxone Itching   Itching and redness near infusion site.    Codeine Other (See Comments)   Short of breath   Ciprofloxacin Hives   Penicillins    Has patient had a PCN reaction causing immediate rash, facial/tongue/throat swelling, SOB or lightheadedness with  hypotension: Unknown Has patient had a PCN reaction causing severe rash involving mucus membranes or skin necrosis: Unknown Has patient had a PCN reaction that required hospitalization: Unknown Has patient had a PCN reaction occurring within the last 10 years: Unknown If all of the above answers are "NO", then may proceed with Cephalosporin use. Other reaction(s): Angioedema (ALLERGY/intolerance) Hives, swelling   Phenol    Other reaction(s): Angioedema (ALLERGY/intolerance), Other (See Comments) Spasms, nerve symptoms Spasms, nerve symptoms Other reaction(s): Angioedema (ALLERGY/intolerance) Spasms, nerve symptoms   Promethazine Hcl         Medication List     STOP taking these medications    albuterol (2.5 MG/3ML) 0.083% nebulizer solution Commonly known as: PROVENTIL   albuterol 108 (90 Base) MCG/ACT inhaler Commonly known as: VENTOLIN HFA   tiotropium 18 MCG inhalation capsule Commonly known as: Spiriva HandiHaler       TAKE these medications    acetaminophen 650 MG CR tablet Commonly known as: TYLENOL Take 650 mg by mouth every 8 (eight) hours as needed for pain.   amLODipine 10 MG tablet Commonly known as: NORVASC Take 1 tablet (10 mg total) by mouth daily.   cyanocobalamin 1000 MCG/ML injection Commonly known as: VITAMIN B12 Inject 1,000 mcg into the skin every 30 (thirty) days.   enoxaparin 40 MG/0.4ML injection Commonly known as: LOVENOX Inject 0.4 mLs (40 mg total) into the skin daily for 8 days.   Fluticasone-Salmeterol 500-50 MCG/DOSE Aepb Commonly known as: ADVAIR Inhale 1 puff into the lungs 2 (two) times daily.   gabapentin 100 MG capsule Commonly known as: Neurontin Take 1 capsule (100 mg total) by mouth at bedtime.   guaiFENesin 600 MG 12 hr tablet Commonly known as: MUCINEX Take 1 tablet (600 mg total) by mouth 2 (two) times daily.   ipratropium-albuterol 0.5-2.5 (3) MG/3ML Soln Commonly known as: DUONEB Inhale 3 mLs into the lungs  in the morning, at noon, in the evening, and at bedtime.   lidocaine 5 % Commonly known as: LIDODERM Place 1 patch onto the skin daily. Remove & Discard patch within 12 hours or as directed by MD Start taking on: May 19, 2022   metoprolol tartrate 25 MG tablet Commonly known as: LOPRESSOR Take 1 tablet (25 mg total) by mouth 2 (two) times daily.   polyethylene glycol 17 g packet Commonly known as: MIRALAX / GLYCOLAX Take 17 g by mouth daily as needed.   predniSONE 20 MG tablet Commonly known as: DELTASONE Take 0.5 tablets (10 mg total) by mouth daily with breakfast for 3 days. Start taking on: May 19, 2022   traMADol 50 MG tablet Commonly known as: ULTRAM Take 0.5 tablets (25 mg total) by mouth every 6 (six) hours as needed for severe pain. What changed:  how much to take when to take this reasons to take this        Contact information for follow-up providers     Renee Harder, MD Follow up in 1 week(s).   Specialty: Orthopedic Surgery Contact information: [address]  [place] [address] [phone]              Contact information for after-discharge care     Destination     [facility] .   Service: Skilled Nursing Contact information: [address] [place] [phone]                    Discharge Exam: Danley Danker Weights   05/12/22 0926  Weight: 54.1 kg   General exam: Appears calm and comfortable  Respiratory system: Clear to auscultation. Respiratory effort normal. Cardiovascular system: S1 & S2 heard, RRR. No JVD, murmurs, rubs, gallops or clicks. No pedal edema. Gastrointestinal system: Abdomen is nondistended, soft and nontender. No organomegaly or masses felt. Normal bowel sounds heard. Central nervous system: Alert and oriented x2. No focal neurological deficits. Extremities: Symmetric 5 x 5 power. Skin: No rashes, lesions or ulcers    Condition at  discharge: fair  The results of significant diagnostics from this hospitalization (including imaging, microbiology, ancillary and laboratory) are listed below for reference.   Imaging Studies: DG Chest Port 1 View  Result Date: 05/13/2022 CLINICAL DATA:  Wheezing. EXAM: PORTABLE CHEST 1 VIEW COMPARISON:  Jan 11, 2022. FINDINGS: Stable cardiomediastinal silhouette. Stable elevated left hemidiaphragm is noted. Left lung is clear. Mild right basilar atelectasis or infiltrate is noted. Bony thorax is unremarkable. IMPRESSION: Mild right basilar subsegmental atelectasis or infiltrate is noted. Elevated left hemidiaphragm. Aortic Atherosclerosis (ICD10-I70.0). Electronically Signed   By: Marijo Conception M.D.   On: 05/13/2022 10:54   CT FEMUR LEFT WO CONTRAST  Result Date: 05/12/2022 CLINICAL DATA:  Leg pain EXAM: CT OF THE LOWER LEFT EXTREMITY WITHOUT CONTRAST TECHNIQUE: Multidetector CT imaging of the lower left extremity was performed according to the standard protocol. RADIATION DOSE REDUCTION: This exam was performed according to the departmental dose-optimization program which includes automated exposure control, adjustment of the mA and/or kV according to patient size and/or use of iterative reconstruction technique. COMPARISON:  None Available. FINDINGS: Bones/Joint/Cartilage Diffuse osteopenia. There is an obliquely oriented nondisplaced fracture of the distal femoral diaphysis. 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 There is moderate medial and patellofemoral compartment osteoarthritis. There is no evidence of fracture extension to the articular/chondral surface of the knee. There is a trace left knee joint effusion. There is no evidence of acute left hip fracture or fracture in the visualized pelvis. Ligaments Suboptimally assessed by CT. Muscles and Tendons No acute  myotendinous abnormality by CT. No intramuscular collection. Soft tissues No focal fluid collection. Atherosclerotic disease of the lower extremity vasculature. Distension of the urinary bladder. Sigmoid diverticulosis. Moderate rectal stool burden. IMPRESSION: Obliquely oriented nondisplaced fracture of the distal femoral diaphysis. No evidence of acute left hip fracture or fracture in the visualized pelvis. 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 Electronically Signed   By: Maurine Simmering M.D.   On: 05/12/2022 12:43   CT Head Wo Contrast  Addendum Date: 05/12/2022   ADDENDUM REPORT: 05/12/2022 10:59 ADDENDUM: As noted in the body of the report, there is mucosal thickening in the paranasal sinuses with tiny air-fluid levels noted in both maxillary sinuses. These imaging features can be seen in the setting of acute on chronic paranasal sinusitis. Electronically Signed   By: Misty Stanley M.D.   On: 05/12/2022 10:59  Result Date: 05/12/2022 CLINICAL DATA:  Status post fall. EXAM: CT HEAD WITHOUT CONTRAST CT CERVICAL SPINE WITHOUT CONTRAST TECHNIQUE: Multidetector CT imaging of the head and cervical spine was performed following the standard protocol without intravenous contrast. Multiplanar CT image reconstructions of the cervical spine were also generated. RADIATION DOSE REDUCTION: This exam was performed according to the departmental dose-optimization program which includes automated exposure control, adjustment of the mA and/or kV according to patient size and/or use of iterative reconstruction technique. COMPARISON:  His CT 05/25/2019. FINDINGS: CT HEAD FINDINGS Brain: There is no evidence for acute hemorrhage, hydrocephalus, or abnormal extra-axial fluid collection. 13 x 13 x 13 mm calcified extra-axial para falcine lesion is identified in the high frontal parietal region (axial 29/3 and coronal 36/4), stable since  prior. No evidence for mass effect or adjacent edema. Old right parietooccipital lobe infarct evident, new since prior study. Age indeterminate low-density in the left occipital lobe is compatible with age indeterminate infarct, also new since prior. Diffuse loss of parenchymal volume is consistent with atrophy. Patchy low attenuation in the deep hemispheric and periventricular white matter is nonspecific, but likely reflects chronic microvascular ischemic demyelination. Vascular: No hyperdense vessel or unexpected calcification. Skull: No evidence for fracture. No worrisome lytic or sclerotic lesion. Sinuses/Orbits: Mucosal thickening noted in the maxillary sinuses bilaterally with associated tiny air-fluid levels. Scattered opacification of ethmoid air cells is associated with mucosal thickening in both frontal and sphenoid sinuses. No mastoid effusion. Other: None. CT CERVICAL SPINE FINDINGS Alignment: Straightening of normal cervical lordosis. Trace anterolisthesis of C3 on 4 is compatible with the facet degeneration at this level. Skull base and vertebrae: No acute fracture. No primary bone lesion or focal pathologic process. Soft tissues and spinal canal: No prevertebral fluid or swelling. No visible canal hematoma. Bilateral thyroid nodules evident measuring up to 2.2 cm on the left. Disc levels: Marked loss of disc height is seen at all levels from C3-4 C6-7. Upper chest: 7 mm right apical nodule visible on 69/2. This is stable since 04/22/2018 consistent with benign etiology. No followup recommended. Other: None. IMPRESSION: 1. Age indeterminate low-density in the left occipital lobe is new since the prior study and compatible with age indeterminate infarct. MRI could be used to further evaluate as clinically indicated. 2. Right parietooccipital lobe infarct appears chronic but is new since prior study. 3. 13 x 13 x 13 mm calcified extra-axial para falcine lesion towards the vertex, stable since prior and  likely a meningioma. No mass effect or adjacent edema. 4. No cervical spine fracture or subluxation. 5. Advanced degenerative changes in the cervical spine. 6. Bilateral thyroid nodules measuring up to 2.2 cm on the left. Recommend thyroid US (ref: J Am Coll Radiol. 2015 Feb;12(2): 143-50). Electronically Signed: By: Misty Stanley M.D. On: 05/12/2022 10:27   CT Cervical Spine Wo Contrast  Addendum Date: 05/12/2022   ADDENDUM REPORT: 05/12/2022 10:59 ADDENDUM: As noted in the body of the report, there is mucosal thickening in the paranasal sinuses with tiny air-fluid levels noted in both maxillary sinuses. These imaging features can be seen in the setting of acute on chronic paranasal sinusitis. Electronically Signed   By: Misty Stanley M.D.   On: 05/12/2022 10:59   Result Date: 05/12/2022 CLINICAL DATA:  Status post fall. EXAM: CT HEAD WITHOUT CONTRAST CT CERVICAL SPINE WITHOUT CONTRAST TECHNIQUE: Multidetector CT imaging of the head and cervical spine was performed following the standard protocol without intravenous contrast. Multiplanar CT image reconstructions of the cervical spine  were also generated. RADIATION DOSE REDUCTION: This exam was performed according to the departmental dose-optimization program which includes automated exposure control, adjustment of the mA and/or kV according to patient size and/or use of iterative reconstruction technique. COMPARISON:  His CT 05/25/2019. FINDINGS: CT HEAD FINDINGS Brain: There is no evidence for acute hemorrhage, hydrocephalus, or abnormal extra-axial fluid collection. 13 x 13 x 13 mm calcified extra-axial para falcine lesion is identified in the high frontal parietal region (axial 29/3 and coronal 36/4), stable since prior. No evidence for mass effect or adjacent edema. Old right parietooccipital lobe infarct evident, new since prior study. Age indeterminate low-density in the left occipital lobe is compatible with age indeterminate infarct, also new since  prior. Diffuse loss of parenchymal volume is consistent with atrophy. Patchy low attenuation in the deep hemispheric and periventricular white matter is nonspecific, but likely reflects chronic microvascular ischemic demyelination. Vascular: No hyperdense vessel or unexpected calcification. Skull: No evidence for fracture. No worrisome lytic or sclerotic lesion. Sinuses/Orbits: Mucosal thickening noted in the maxillary sinuses bilaterally with associated tiny air-fluid levels. Scattered opacification of ethmoid air cells is associated with mucosal thickening in both frontal and sphenoid sinuses. No mastoid effusion. Other: None. CT CERVICAL SPINE FINDINGS Alignment: Straightening of normal cervical lordosis. Trace anterolisthesis of C3 on 4 is compatible with the facet degeneration at this level. Skull base and vertebrae: No acute fracture. No primary bone lesion or focal pathologic process. Soft tissues and spinal canal: No prevertebral fluid or swelling. No visible canal hematoma. Bilateral thyroid nodules evident measuring up to 2.2 cm on the left. Disc levels: Marked loss of disc height is seen at all levels from C3-4 C6-7. Upper chest: 7 mm right apical nodule visible on 69/2. This is stable since 04/22/2018 consistent with benign etiology. No followup recommended. Other: None. IMPRESSION: 1. Age indeterminate low-density in the left occipital lobe is new since the prior study and compatible with age indeterminate infarct. MRI could be used to further evaluate as clinically indicated. 2. Right parietooccipital lobe infarct appears chronic but is new since prior study. 3. 13 x 13 x 13 mm calcified extra-axial para falcine lesion towards the vertex, stable since prior and likely a meningioma. No mass effect or adjacent edema. 4. No cervical spine fracture or subluxation. 5. Advanced degenerative changes in the cervical spine. 6. Bilateral thyroid nodules measuring up to 2.2 cm on the left. Recommend thyroid US  (ref: J Am Coll Radiol. 2015 Feb;12(2): 143-50). Electronically Signed: By: Misty Stanley M.D. On: 05/12/2022 10:27   DG Hip Unilat W or Wo Pelvis 2-3 Views Left  Result Date: 05/12/2022 CLINICAL DATA:  Pain after fall EXAM: DG HIP (WITH OR WITHOUT PELVIS) 2-3V LEFT COMPARISON:  None Available. FINDINGS: There is no evidence of hip fracture or dislocation. There is no evidence of arthropathy or other focal bone abnormality. IMPRESSION: Negative. Electronically Signed   By: Dorise Bullion III M.D.   On: 05/12/2022 10:26   DG Knee 2 Views Left  Result Date: 05/12/2022 CLINICAL DATA:  Pain after fall EXAM: LEFT KNEE - 1-2 VIEW COMPARISON:  None Available. FINDINGS: A lucency crossing the distal femoral diaphysis is consistent with a nondisplaced fracture. No other fractures. No dislocations. No joint effusion. Vascular calcifications. IMPRESSION: A lucency crossing the distal femoral diaphysis is most consistent with a nondisplaced fracture only well seen on one view. Electronically Signed   By: Dorise Bullion III M.D.   On: 05/12/2022 10:25    Microbiology: Results for  orders placed or performed during the hospital encounter of 07/17/19  SARS CORONAVIRUS 2 (TAT 6-24 HRS) Nasopharyngeal Nasopharyngeal Swab     Status: None   Collection Time: 07/17/19 11:38 PM   Specimen: Nasopharyngeal Swab  Result Value Ref Range Status   SARS Coronavirus 2 NEGATIVE NEGATIVE Final    Comment: (NOTE) SARS-CoV-2 target nucleic acids are NOT DETECTED. The SARS-CoV-2 RNA is generally detectable in upper and lower respiratory specimens during the acute phase of infection. Negative results do not preclude SARS-CoV-2 infection, do not rule out co-infections with other pathogens, and should not be used as the sole basis for treatment or other patient management decisions. Negative results must be combined with clinical observations, patient history, and epidemiological information. The expected result is  Negative. Fact Sheet for Patients: SugarRoll.be Fact Sheet for Healthcare Providers: https://www.woods-mathews.com/ This test is not yet approved or cleared by the [place] FDA and  has been authorized for detection and/or diagnosis of SARS-CoV-2 by FDA under an Emergency Use Authorization (EUA). This EUA will remain  in effect (meaning this test can be used) for the duration of the COVID-19 declaration under Section 56 4(b)(1) of the Act, 21 U.S.C. section 360bbb-3(b)(1), unless the authorization is terminated or revoked sooner. Performed at [facility], [place] [address], [place], [place] [address]   Culture, blood (routine x 2) Call MD if unable to obtain prior to antibiotics being given     Status: None   Collection Time: 07/18/19  8:33 AM   Specimen: BLOOD  Result Value Ref Range Status   Specimen Description BLOOD L AC  Final   Special Requests   Final    BOTTLES DRAWN AEROBIC AND ANAEROBIC Blood Culture adequate volume   Culture   Final    NO GROWTH 5 DAYS Performed at [facility], [address] [place], [place] [address]    Report Status 07/23/2019 FINAL  Final  Culture, blood (routine x 2) Call MD if unable to obtain prior to antibiotics being given     Status: None   Collection Time: 07/18/19  8:34 AM   Specimen: BLOOD  Result Value Ref Range Status   Specimen Description BLOOD L ARM  Final   Special Requests   Final    BOTTLES DRAWN AEROBIC AND ANAEROBIC Blood Culture adequate volume   Culture   Final    NO GROWTH 5 DAYS Performed at [facility], [address] [place], [place] [address]    Report Status 07/23/2019 FINAL  Final    Labs: CBC: Recent Labs  Lab 05/12/22 0930 05/14/22 0613 05/16/22 0638 05/17/22 0620 05/18/22 0631  WBC 6.6 8.0 10.4 8.7 9.2  NEUTROABS 4.1  --   --   --   --   HGB 11.7* 9.9* 10.1* 9.6* 10.1*  HCT 37.7 30.7* 31.4* 30.1* 31.6*  MCV 89.3 88.0 88.7 88.0  87.1  PLT 225 187 235 230 448   Basic Metabolic Panel: Recent Labs  Lab 05/12/22 0930 05/14/22 0613 05/16/22 0638  NA 140 137 139  K 4.0 4.0 4.0  CL 108 101 103  CO2 '28 27 28  '$ GLUCOSE 99 92 99  BUN 11 25* 31*  CREATININE 0.56 1.00 0.64  CALCIUM 8.6* 8.6* 8.9   Liver Function Tests: No results for input(s): "AST", "ALT", "ALKPHOS", "BILITOT", "PROT", "ALBUMIN" in the last 168 hours. CBG: No results for input(s): "GLUCAP" in the last 168 hours.  Discharge time spent: greater than 30 minutes.  Signed:  Sharen Hones, MD [facility] 05/18/2022

## 2022-05-18 NOTE — Plan of Care (Signed)
  Problem: Education: Goal: Knowledge of General Education information will improve Description: Including pain rating scale, medication(s)/side effects and non-pharmacologic comfort measures 05/18/2022 0326 by Suzette Battiest, LPN Outcome: Progressing   Problem: Health Behavior/Discharge Planning: Goal: Ability to manage health-related needs will improve 05/18/2022 0326 by Suzette Battiest, LPN Outcome: Progressing   Problem: Clinical Measurements: Goal: Will remain free from infection 05/18/2022 0326 by Suzette Battiest, LPN Outcome: Adequate for Discharge   Problem: Clinical Measurements: Goal: Respiratory complications will improve 05/18/2022 0326 by Suzette Battiest, LPN Outcome: Adequate for Discharge   Problem: Clinical Measurements: Goal: Cardiovascular complication will be avoided 05/18/2022 0326 by Suzette Battiest, LPN Outcome: Adequate for Discharge   Problem: Nutrition: Goal: Adequate nutrition will be maintained 05/18/2022 0326 by Suzette Battiest, LPN Outcome: Adequate for Discharge   Problem: Coping: Goal: Level of anxiety will decrease 05/18/2022 0326 by Suzette Battiest, LPN Outcome: Progressing   Problem: Elimination: Goal: Will not experience complications related to bowel motility 05/18/2022 0326 by Suzette Battiest, LPN Outcome: Adequate for Discharge   Problem: Elimination: Goal: Will not experience complications related to urinary retention 05/18/2022 0326 by Suzette Battiest, LPN Outcome: Adequate for Discharge   Problem: Pain Managment: Goal: General experience of comfort will improve 05/18/2022 0326 by Suzette Battiest, LPN Outcome: Adequate for Discharge   Problem: Safety: Goal: Ability to remain free from injury will improve 05/18/2022 0326 by Suzette Battiest, LPN Outcome: Adequate for Discharge   Problem: Skin Integrity: Goal: Risk for impaired skin integrity will decrease 05/18/2022 0326 by Suzette Battiest, LPN Outcome: Adequate for Discharge

## 2022-05-18 NOTE — Plan of Care (Signed)
  Problem: Health Behavior/Discharge Planning: Goal: Ability to manage health-related needs will improve Outcome: Progressing   Problem: Clinical Measurements: Goal: Will remain free from infection Outcome: Progressing   Problem: Clinical Measurements: Goal: Respiratory complications will improve Outcome: Progressing   Problem: Nutrition: Goal: Adequate nutrition will be maintained Outcome: Progressing   Problem: Elimination: Goal: Will not experience complications related to urinary retention Outcome: Progressing   Problem: Pain Managment: Goal: General experience of comfort will improve Outcome: Progressing

## 2022-05-18 NOTE — Progress Notes (Signed)
Report was called to [facility] and given to [employer]. Patient is waiting on EMS transport and family is at bedside.

## 2022-05-18 NOTE — TOC Transition Note (Addendum)
Transition of Care [facility]) - CM/SW Discharge Note   Patient Details  Name: Janice Hoffman MRN: [id number] Date of Birth: 1925-10-02  [facility] CM/SW Contact:  Magnus Ivan, LCSW Phone Number: 05/18/2022, 10:26 AM   Clinical Narrative:    Discharge to [facility] today. Room 112. Confirmed with Admissions Worker Crystal. Updated MD, RN, and left VM for son. Asked RN to call report. EMS paperwork completed. Will call for [facility] when RN is ready.   11:02- [facility] called for transport. Patient is next on the list pending truck availability.   12:33- Provided update to son via phone.    Final next level of care: Skilled Nursing Facility Barriers to Discharge: Barriers Resolved   Patient Goals and CMS Choice        Discharge Placement              Patient chooses bed at: [facility] Patient to be transferred to facility by: [facility]   Patient and family notified of of transfer: 05/18/22  Discharge Plan and Services                                     Social Determinants of Health (SDOH) Interventions     Readmission Risk Interventions     No data to display

## 2022-05-20 ENCOUNTER — Encounter: Payer: Self-pay | Admitting: Student

## 2022-05-20 ENCOUNTER — Non-Acute Institutional Stay (SKILLED_NURSING_FACILITY): Payer: Self-pay | Admitting: Student

## 2022-05-20 DIAGNOSIS — J441 Chronic obstructive pulmonary disease with (acute) exacerbation: Secondary | ICD-10-CM

## 2022-05-20 DIAGNOSIS — I10 Essential (primary) hypertension: Secondary | ICD-10-CM

## 2022-05-20 DIAGNOSIS — R1310 Dysphagia, unspecified: Secondary | ICD-10-CM

## 2022-05-20 DIAGNOSIS — S72402D Unspecified fracture of lower end of left femur, subsequent encounter for closed fracture with routine healing: Secondary | ICD-10-CM

## 2022-05-20 DIAGNOSIS — Z87898 Personal history of other specified conditions: Secondary | ICD-10-CM | POA: Insufficient documentation

## 2022-05-20 DIAGNOSIS — D510 Vitamin B12 deficiency anemia due to intrinsic factor deficiency: Secondary | ICD-10-CM

## 2022-05-20 DIAGNOSIS — F0390 Unspecified dementia without behavioral disturbance: Secondary | ICD-10-CM

## 2022-05-20 MED ORDER — TRAMADOL HCL 50 MG PO TABS: 25.0000 mg | ORAL_TABLET | Freq: Every day | ORAL | 0 refills | Status: AC | PRN

## 2022-05-20 NOTE — Progress Notes (Signed)
Provider:  Dr. Dewayne Shorter Location:  Other [facility]) [place] Room Number: [place] [address] of Service:  SNF (31)  PCP: Baxter Hire, MD Patient Care Team: Baxter Hire, MD as PCP - General (Internal Medicine)  Extended Emergency Contact Information Primary Emergency Contact: [place]  [place] Phone: [phone] Mobile Phone: [phone] Relation: Son Secondary Emergency Contact: [name] Address: [address]          [place], [place] [address] [name] Mobile Phone: [phone] Relation: Relative  Code Status: DNR Goals of Care: Advanced Directive information    05/12/2022    9:27 AM  Advanced Directives  Does Patient Have a Medical Advance Directive? No  Would patient like information on creating a medical advance directive? No - Patient declined    Chief Complaint  Patient presents with   Hospitalization [facility] Follow up    HPI: Patient is a 88 y.o. female seen today for admission to [facility] after admission to the hospital for closed left distal femur fracture. Found to have a COPD exacerbation started on steroids for 3 days. Hospitalization complicated by delirium for which Seroquel was attempted and was oversedating.   She was having trouble with walking. Had a fall with injury about 5 years ago. She has a problem walking. Patient is able to tell me her name, DOB. She says today is March 1984. She says we are currently in her home. She is continent of stool. She is incontinent of urine. She doesn't cook anymore. She doesn't require someone to feed her in general. She usually speaks in complete sentences - usually doesn't have to have yes or no questions.   Her caregiver is at bedside. Jimmy Picket. She has been with her for one month. She helps with history. She doesn't talk much. She gets up and goes to the recliner. She uses the restroom on her own, and gets some  assistance. She has a good appetite-- all of breakfast. Lunch eats less and dinner she doesn't eat much. The goal is to get back home with this therapy. The first night Diane spent the nigth with her Sat-Tues night, she woke up at [place] and she didn't hear her get up. Ms. Alesi walked out of her room without her walker. She fell to the floor. When she went to look at her the knee was swollen. She got her off of the floor to the wheelchair.   She gets annoyed when she gets questions.   Son and daughter are chiropractors. Spoke to her son Dr. Rock Nephew on the phone and he was able to offer additional information regarding her health and 12 years ago she fractured the left leg at that time. She was previously ambulatory, but this fall has had an impact. She doesn't have a lot of dorsiflexion of the left leg.   Past Medical History:  Diagnosis Date   Asthma    Closed displaced fracture of distal epiphysis of left femur with routine healing 05/13/2022   COPD (chronic obstructive pulmonary disease) (HCC)    Herpes zoster    Hypertension    Neuralgia    Past Surgical History:  Procedure Laterality Date   LEG SURGERY      reports that she has never smoked. She has never used smokeless tobacco. She reports that she does not drink alcohol and does not use drugs. Social History   Socioeconomic History   Marital  status: Widowed    Spouse name: Not on file   Number of children: Not on file   Years of education: Not on file   Highest education level: Not on file  Occupational History   Occupation: retired  Tobacco Use   Smoking status: Never   Smokeless tobacco: Never  Vaping Use   Vaping Use: Never used  Substance and Sexual Activity   Alcohol use: No    Alcohol/week: 0.0 standard drinks of alcohol   Drug use: No   Sexual activity: Not Currently  Other Topics Concern   Not on file  Social History Narrative   Not on file   Social Determinants of Health   Financial Resource Strain: Unknown  (07/19/2018)   Overall Financial Resource Strain (CARDIA)    Difficulty of Paying Living Expenses: Patient refused  Food Insecurity: Unknown (07/19/2018)   Hunger Vital Sign    Worried About [place] in the Last Year: Patient refused    [place] in the Last Year: Patient refused  Transportation Needs: Unknown (07/19/2018)   [place] - Hydrologist (Medical): Patient refused    Lack of Transportation (Non-Medical): Patient refused  Physical Activity: Unknown (07/19/2018)   Exercise Vital Sign    Days of Exercise per Week: Patient refused    Minutes of Exercise per Session: Patient refused  Stress: Not on file  Social Connections: Unknown (07/19/2018)   Social Connection and Isolation Panel [NHANES]    Frequency of Communication with Friends and Family: Patient refused    Frequency of Social Gatherings with Friends and Family: Patient refused    Attends Religious Services: Patient refused    Active Member of Clubs or Organizations: Patient refused    Attends Archivist Meetings: Patient refused    Marital Status: Patient refused  Intimate Partner Violence: Unknown (07/19/2018)   Humiliation, Afraid, Rape, and Kick questionnaire    Fear of Current or Ex-Partner: Patient refused    Emotionally Abused: Patient refused    Physically Abused: Patient refused    Sexually Abused: Patient refused    Functional Status Survey:    Family History  Problem Relation Age of Onset   COPD Sister     Health Maintenance  Topic Date Due   COVID-19 Vaccine (1) Never done   Pneumonia Vaccine 72+ Years old (1 - PCV) Never done   TETANUS/TDAP  Never done   Zoster Vaccines- Shingrix (1 of 2) Never done   DEXA SCAN  Never done   INFLUENZA VACCINE  04/09/2022   HPV VACCINES  Aged Out    Allergies  Allergen Reactions   Aspirin Shortness Of Breath   Nsaids Shortness Of Breath and Anaphylaxis    Throat swelling, shortness of breath   Sulfa  Antibiotics Other (See Comments)    Stomach pain Stomach pain    Ceftriaxone Itching    Itching and redness near infusion site.    Codeine Other (See Comments)    Short of breath    Ciprofloxacin Hives   Penicillins     Has patient had a PCN reaction causing immediate rash, facial/tongue/throat swelling, SOB or lightheadedness with hypotension: Unknown Has patient had a PCN reaction causing severe rash involving mucus membranes or skin necrosis: Unknown Has patient had a PCN reaction that required hospitalization: Unknown Has patient had a PCN reaction occurring within the last 10 years: Unknown If all of the above answers are "NO", then may proceed with Cephalosporin  use.  Other reaction(s): Angioedema (ALLERGY/intolerance) Hives, swelling   Phenol     Other reaction(s): Angioedema (ALLERGY/intolerance), Other (See Comments) Spasms, nerve symptoms Spasms, nerve symptoms  Other reaction(s): Angioedema (ALLERGY/intolerance) Spasms, nerve symptoms   Promethazine Hcl     Outpatient Encounter Medications as of 05/20/2022  Medication Sig   acetaminophen (TYLENOL) 650 MG CR tablet Take 650 mg by mouth every 8 (eight) hours as needed for pain.   cyanocobalamin (,VITAMIN B-12,) 1000 MCG/ML injection Inject 1,000 mcg into the skin every 30 (thirty) days.    diclofenac Sodium (VOLTAREN ARTHRITIS PAIN) 1 % GEL Apply 2 g topically 2 (two) times daily.   enoxaparin (LOVENOX) 40 MG/0.4ML injection Inject 0.4 mLs (40 mg total) into the skin daily for 8 days.   Fluticasone-Salmeterol (ADVAIR) 500-50 MCG/DOSE AEPB Inhale 1 puff into the lungs 2 (two) times daily.   gabapentin (NEURONTIN) 100 MG capsule Take 1 capsule (100 mg total) by mouth at bedtime.   guaiFENesin (MUCINEX) 600 MG 12 hr tablet Take 1 tablet (600 mg total) by mouth 2 (two) times daily.   metoprolol tartrate (LOPRESSOR) 25 MG tablet Take 1 tablet (25 mg total) by mouth 2 (two) times daily.   polyethylene glycol (MIRALAX /  GLYCOLAX) packet Take 17 g by mouth daily as needed.   predniSONE (DELTASONE) 20 MG tablet Take 0.5 tablets (10 mg total) by mouth daily with breakfast for 3 days.   traMADol (ULTRAM) 50 MG tablet Take 0.5 tablets (25 mg total) by mouth every 6 (six) hours as needed for severe pain.   [DISCONTINUED] amLODipine (NORVASC) 10 MG tablet Take 1 tablet (10 mg total) by mouth daily. (Patient not taking: Reported on 05/12/2022)   [DISCONTINUED] clopidogrel (PLAVIX) 75 MG tablet Take 1 tablet (75 mg total) by mouth daily.   [DISCONTINUED] ipratropium-albuterol (DUONEB) 0.5-2.5 (3) MG/3ML SOLN Inhale 3 mLs into the lungs in the morning, at noon, in the evening, and at bedtime.   [DISCONTINUED] lidocaine (LIDODERM) 5 % Place 1 patch onto the skin daily. Remove & Discard patch within 12 hours or as directed by MD   No facility-administered encounter medications on file as of 05/20/2022.    Review of Systems  Unable to perform ROS: Dementia  Constitutional:        Caregiver endorses a change in PO intake   Musculoskeletal:        Left knee pain  Psychiatric/Behavioral:         Patient is easily annoyed and will refuse to answer questions periodically. Was very confused and combative in the hospital.    Vitals:   05/20/22 0952  BP: 130/63  Pulse: 85  Resp: 18  Temp: (!) 97.5 F (36.4 C)  TempSrc: Skin  SpO2: 93%  Weight: 108 lb 3.2 oz (49.1 kg)  Height: '5\' 4"'$  (1.626 m)   Body mass index is 18.57 kg/m. Physical Exam Constitutional:      Comments: thin  HENT:     Head: Normocephalic and atraumatic.  Cardiovascular:     Rate and Rhythm: Normal rate and regular rhythm.     Comments: 3/6 harsh systolic murmur Pulmonary:     Effort: Pulmonary effort is normal.     Breath sounds: Normal breath sounds.  Abdominal:     General: Abdomen is flat.     Palpations: Abdomen is soft.  Musculoskeletal:     Comments: Left knee with swelling and slightly warm to the touch, able to wiggle toes  bilaterally, limited neuro exam due  to patient's inability to participate.   Neurological:     Mental Status: She is alert.     Comments: Patient is at her baseline. Oriented to self only. Able to answer short yes and no questions. Thinks it is March of 1984 and we are in her home (actually in nursing home).    Labs reviewed: Basic Metabolic Panel: Recent Labs    01/11/22 1333 05/12/22 0930 05/14/22 0613 05/16/22 0638  NA 136 140 137 139  K 4.5 4.0 4.0 4.0  CL 100 108 101 103  CO2 '26 28 27 28  '$ GLUCOSE 117* 99 92 99  BUN 23 11 25* 31*  CREATININE 0.67 0.56 1.00 0.64  CALCIUM 9.3 8.6* 8.6* 8.9  MG 2.4  --   --   --    Liver Function Tests: Recent Labs    01/11/22 1333  AST 21  ALT 12  ALKPHOS 37*  BILITOT 0.9  PROT 7.5  ALBUMIN 4.0   No results for input(s): "LIPASE", "AMYLASE" in the last 8760 hours. No results for input(s): "AMMONIA" in the last 8760 hours. CBC: Recent Labs    05/12/22 0930 05/14/22 0613 05/16/22 0638 05/17/22 0620 05/18/22 0631  WBC 6.6   < > 10.4 8.7 9.2  NEUTROABS 4.1  --   --   --   --   HGB 11.7*   < > 10.1* 9.6* 10.1*  HCT 37.7   < > 31.4* 30.1* 31.6*  MCV 89.3   < > 88.7 88.0 87.1  PLT 225   < > 235 230 268   < > = values in this interval not displayed.   Cardiac Enzymes: No results for input(s): "CKTOTAL", "CKMB", "CKMBINDEX", "TROPONINI" in the last 8760 hours. BNP: Invalid input(s): "POCBNP" Lab Results  Component Value Date   HGBA1C 5.2 07/19/2018   Lab Results  Component Value Date   TSH 1.424 02/21/2018   Lab Results  Component Value Date   VITAMINB12 113 (L) 05/16/2022   Lab Results  Component Value Date   FOLATE 6.9 07/18/2018   Lab Results  Component Value Date   IRON 20 (L) 05/16/2022   TIBC 339 05/16/2022   FERRITIN 112 07/18/2018    Imaging and Procedures obtained prior to SNF admission: DG Chest Port 1 View  Result Date: 05/13/2022 CLINICAL DATA:  Wheezing. EXAM: PORTABLE CHEST 1 VIEW COMPARISON:   Jan 11, 2022. FINDINGS: Stable cardiomediastinal silhouette. Stable elevated left hemidiaphragm is noted. Left lung is clear. Mild right basilar atelectasis or infiltrate is noted. Bony thorax is unremarkable. IMPRESSION: Mild right basilar subsegmental atelectasis or infiltrate is noted. Elevated left hemidiaphragm. Aortic Atherosclerosis (ICD10-I70.0). Electronically Signed   By: Marijo Conception M.D.   On: 05/13/2022 10:54   CT FEMUR LEFT WO CONTRAST  Result Date: 05/12/2022 CLINICAL DATA:  Leg pain EXAM: CT OF THE LOWER LEFT EXTREMITY WITHOUT CONTRAST TECHNIQUE: Multidetector CT imaging of the lower left extremity was performed according to the standard protocol. RADIATION DOSE REDUCTION: This exam was performed according to the departmental dose-optimization program which includes automated exposure control, adjustment of the mA and/or kV according to patient size and/or use of iterative reconstruction technique. COMPARISON:  None Available. FINDINGS: Bones/Joint/Cartilage Diffuse osteopenia. There is an obliquely oriented nondisplaced fracture of the distal femoral diaphysis. 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 There is moderate medial and patellofemoral compartment osteoarthritis. There is no evidence of  fracture extension to the articular/chondral surface of the knee. There is a trace left knee joint effusion. There is no evidence of acute left hip fracture or fracture in the visualized pelvis. Ligaments Suboptimally assessed by CT. Muscles and Tendons No acute myotendinous abnormality by CT. No intramuscular collection. Soft tissues No focal fluid collection. Atherosclerotic disease of the lower extremity vasculature. Distension of the urinary bladder. Sigmoid diverticulosis. Moderate rectal stool burden. IMPRESSION: Obliquely oriented nondisplaced fracture of the  distal femoral diaphysis. No evidence of acute left hip fracture or fracture in the visualized pelvis. 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 Electronically Signed   By: Maurine Simmering M.D.   On: 05/12/2022 12:43   CT Head Wo Contrast  Addendum Date: 05/12/2022   ADDENDUM REPORT: 05/12/2022 10:59 ADDENDUM: As noted in the body of the report, there is mucosal thickening in the paranasal sinuses with tiny air-fluid levels noted in both maxillary sinuses. These imaging features can be seen in the setting of acute on chronic paranasal sinusitis. Electronically Signed   By: Misty Stanley M.D.   On: 05/12/2022 10:59   Result Date: 05/12/2022 CLINICAL DATA:  Status post fall. EXAM: CT HEAD WITHOUT CONTRAST CT CERVICAL SPINE WITHOUT CONTRAST TECHNIQUE: Multidetector CT imaging of the head and cervical spine was performed following the standard protocol without intravenous contrast. Multiplanar CT image reconstructions of the cervical spine were also generated. RADIATION DOSE REDUCTION: This exam was performed according to the departmental dose-optimization program which includes automated exposure control, adjustment of the mA and/or kV according to patient size and/or use of iterative reconstruction technique. COMPARISON:  His CT 05/25/2019. FINDINGS: CT HEAD FINDINGS Brain: There is no evidence for acute hemorrhage, hydrocephalus, or abnormal extra-axial fluid collection. 13 x 13 x 13 mm calcified extra-axial para falcine lesion is identified in the high frontal parietal region (axial 29/3 and coronal 36/4), stable since prior. No evidence for mass effect or adjacent edema. Old right parietooccipital lobe infarct evident, new since prior study. Age indeterminate low-density in the left occipital lobe is compatible with age indeterminate infarct, also new since prior. Diffuse loss of parenchymal volume is consistent with atrophy.  Patchy low attenuation in the deep hemispheric and periventricular white matter is nonspecific, but likely reflects chronic microvascular ischemic demyelination. Vascular: No hyperdense vessel or unexpected calcification. Skull: No evidence for fracture. No worrisome lytic or sclerotic lesion. Sinuses/Orbits: Mucosal thickening noted in the maxillary sinuses bilaterally with associated tiny air-fluid levels. Scattered opacification of ethmoid air cells is associated with mucosal thickening in both frontal and sphenoid sinuses. No mastoid effusion. Other: None. CT CERVICAL SPINE FINDINGS Alignment: Straightening of normal cervical lordosis. Trace anterolisthesis of C3 on 4 is compatible with the facet degeneration at this level. Skull base and vertebrae: No acute fracture. No primary bone lesion or focal pathologic process. Soft tissues and spinal canal: No prevertebral fluid or swelling. No visible canal hematoma. Bilateral thyroid nodules evident measuring up to 2.2 cm on the left. Disc levels: Marked loss of disc height is seen at all levels from C3-4 C6-7. Upper chest: 7 mm right apical nodule visible on 69/2. This is stable since 04/22/2018 consistent with benign etiology. No followup recommended. Other: None. IMPRESSION: 1. Age indeterminate low-density in the left occipital lobe is new since the prior study and compatible with age indeterminate infarct. MRI could be used to further evaluate as clinically indicated. 2. Right parietooccipital lobe infarct appears chronic but is new  since prior study. 3. 13 x 13 x 13 mm calcified extra-axial para falcine lesion towards the vertex, stable since prior and likely a meningioma. No mass effect or adjacent edema. 4. No cervical spine fracture or subluxation. 5. Advanced degenerative changes in the cervical spine. 6. Bilateral thyroid nodules measuring up to 2.2 cm on the left. Recommend thyroid US (ref: J Am Coll Radiol. 2015 Feb;12(2): 143-50). Electronically Signed:  By: Misty Stanley M.D. On: 05/12/2022 10:27   CT Cervical Spine Wo Contrast  Addendum Date: 05/12/2022   ADDENDUM REPORT: 05/12/2022 10:59 ADDENDUM: As noted in the body of the report, there is mucosal thickening in the paranasal sinuses with tiny air-fluid levels noted in both maxillary sinuses. These imaging features can be seen in the setting of acute on chronic paranasal sinusitis. Electronically Signed   By: Misty Stanley M.D.   On: 05/12/2022 10:59   Result Date: 05/12/2022 CLINICAL DATA:  Status post fall. EXAM: CT HEAD WITHOUT CONTRAST CT CERVICAL SPINE WITHOUT CONTRAST TECHNIQUE: Multidetector CT imaging of the head and cervical spine was performed following the standard protocol without intravenous contrast. Multiplanar CT image reconstructions of the cervical spine were also generated. RADIATION DOSE REDUCTION: This exam was performed according to the departmental dose-optimization program which includes automated exposure control, adjustment of the mA and/or kV according to patient size and/or use of iterative reconstruction technique. COMPARISON:  His CT 05/25/2019. FINDINGS: CT HEAD FINDINGS Brain: There is no evidence for acute hemorrhage, hydrocephalus, or abnormal extra-axial fluid collection. 13 x 13 x 13 mm calcified extra-axial para falcine lesion is identified in the high frontal parietal region (axial 29/3 and coronal 36/4), stable since prior. No evidence for mass effect or adjacent edema. Old right parietooccipital lobe infarct evident, new since prior study. Age indeterminate low-density in the left occipital lobe is compatible with age indeterminate infarct, also new since prior. Diffuse loss of parenchymal volume is consistent with atrophy. Patchy low attenuation in the deep hemispheric and periventricular white matter is nonspecific, but likely reflects chronic microvascular ischemic demyelination. Vascular: No hyperdense vessel or unexpected calcification. Skull: No evidence for  fracture. No worrisome lytic or sclerotic lesion. Sinuses/Orbits: Mucosal thickening noted in the maxillary sinuses bilaterally with associated tiny air-fluid levels. Scattered opacification of ethmoid air cells is associated with mucosal thickening in both frontal and sphenoid sinuses. No mastoid effusion. Other: None. CT CERVICAL SPINE FINDINGS Alignment: Straightening of normal cervical lordosis. Trace anterolisthesis of C3 on 4 is compatible with the facet degeneration at this level. Skull base and vertebrae: No acute fracture. No primary bone lesion or focal pathologic process. Soft tissues and spinal canal: No prevertebral fluid or swelling. No visible canal hematoma. Bilateral thyroid nodules evident measuring up to 2.2 cm on the left. Disc levels: Marked loss of disc height is seen at all levels from C3-4 C6-7. Upper chest: 7 mm right apical nodule visible on 69/2. This is stable since 04/22/2018 consistent with benign etiology. No followup recommended. Other: None. IMPRESSION: 1. Age indeterminate low-density in the left occipital lobe is new since the prior study and compatible with age indeterminate infarct. MRI could be used to further evaluate as clinically indicated. 2. Right parietooccipital lobe infarct appears chronic but is new since prior study. 3. 13 x 13 x 13 mm calcified extra-axial para falcine lesion towards the vertex, stable since prior and likely a meningioma. No mass effect or adjacent edema. 4. No cervical spine fracture or subluxation. 5. Advanced degenerative changes in the cervical spine.  6. Bilateral thyroid nodules measuring up to 2.2 cm on the left. Recommend thyroid US (ref: J Am Coll Radiol. 2015 Feb;12(2): 143-50). Electronically Signed: By: Misty Stanley M.D. On: 05/12/2022 10:27   DG Hip Unilat W or Wo Pelvis 2-3 Views Left  Result Date: 05/12/2022 CLINICAL DATA:  Pain after fall EXAM: DG HIP (WITH OR WITHOUT PELVIS) 2-3V LEFT COMPARISON:  None Available. FINDINGS: There is  no evidence of hip fracture or dislocation. There is no evidence of arthropathy or other focal bone abnormality. IMPRESSION: Negative. Electronically Signed   By: Dorise Bullion III M.D.   On: 05/12/2022 10:26   DG Knee 2 Views Left  Result Date: 05/12/2022 CLINICAL DATA:  Pain after fall EXAM: LEFT KNEE - 1-2 VIEW COMPARISON:  None Available. FINDINGS: A lucency crossing the distal femoral diaphysis is consistent with a nondisplaced fracture. No other fractures. No dislocations. No joint effusion. Vascular calcifications. IMPRESSION: A lucency crossing the distal femoral diaphysis is most consistent with a nondisplaced fracture only well seen on one view. Electronically Signed   By: Dorise Bullion III M.D.   On: 05/12/2022 10:25    Assessment/Plan 1. Closed fracture of distal end of left femur with routine healing, unspecified fracture morphology, subsequent encounter Patient had a fracture of the distal femur due to ground-level, unwitnessed fall. Patient is not on chronic medications that could have contributed to the fall. She has also had issues with daytime sedation. Discussed importance of good pain control. Will continue scheduled tylenol and Tramadol 25 mg daily PRN before therapy, otherwise will not offer additional pain medications.   2. COPD with acute exacerbation ([place]) Patient treated for exacerbation. Now lungs clear and normal saturation on room air. Will discontinue steroids given short course in hospital.   3. Essential hypertension Patient's BP today within normal treatment range. Concern for over-treatment of hypertension. If patient's BP are consistently below 120/80 will plan to discontinue lisinopril. For now, continue lisinopril '10mg'$ , continue metoprolol.   4. Dysphagia, unspecified type No obvious issues at this time, will continue to monitor for need of SLP support   5. Dementia without behavioral disturbance ([place])  Hx of Delirium Patient has a FAST score of 6D given  she still has fecal continence but requires continued support for urinary and bathing. Will defer showering until cleared by ortho and therapy. Patient this history of delirium in hospitalization. Unable to tolerate treatment with Seroquel-- very sensitive to sedative medications. Will encourage behavioral management. In April of this year patient weight 110 lbs, today she is 108. Per chart review in 2020 patient was ~128 lbs. Patient is DNAR at this time, will continue goals of care conversations.   6. Pernicious Anemia Patient's Vitamin B-12 level in hospital 112, will plan to continue oral supplementation.   Family/ staff Communication: Spoke with son and nursing staff  Labs/tests ordered: none  I spent 60 minutes in face-to-face discussion, chart review, education and documentation for the care of this patient.   Tomasa Rand, MD, [facility] [phone]

## 2022-05-22 ENCOUNTER — Non-Acute Institutional Stay (SKILLED_NURSING_FACILITY): Payer: PPO | Admitting: Student

## 2022-05-22 DIAGNOSIS — F02C0 Dementia in other diseases classified elsewhere, severe, without behavioral disturbance, psychotic disturbance, mood disturbance, and anxiety: Secondary | ICD-10-CM | POA: Diagnosis not present

## 2022-05-22 DIAGNOSIS — G301 Alzheimer's disease with late onset: Secondary | ICD-10-CM

## 2022-05-22 NOTE — Progress Notes (Signed)
Location:      Place of Service:    Provider:  Dewayne Shorter, MD  Baxter Hire, MD  Patient Care Team: Baxter Hire, MD as PCP - General (Internal Medicine)  Extended Emergency Contact Information Primary Emergency Contact: [place]  [place] Phone: [phone] Mobile Phone: [phone] Relation: Son Secondary Emergency Contact: [name] Address: [address]          [place], [place] [address] [name] Mobile Phone: [phone] Relation: Relative  Code Status:  DNAR Goals of care: Advanced Directive information    05/12/2022    9:27 AM  Advanced Directives  Does Patient Have a Medical Advance Directive? No  Would patient like information on creating a medical advance directive? No - Patient declined     No chief complaint on file.   HPI:  Pt is a 88 y.o. female seen today for an acute visit for decreased PO intake and call to overnight on-call provider  Spoke with son about trazodone and melatoning. Declines ambien and discussed with him we woul dnot like to do this. Last night patient was ocmpletely lucid and said a prayer. She has nibbled on food but has had limited PO intake. Highlighted patient's weight 1 year ago was ~128, and is now down to 108. Concern for >10% weight loss in a patient with alzheimer's Son's focus is to make sure she get's back to where she was before the fall, but discussed my concern that this may have been progression of her alzheimer's. She has also refused medications and spit out pills.    Past Medical History:  Diagnosis Date   Asthma    Closed displaced fracture of distal epiphysis of left femur with routine healing 05/13/2022   COPD (chronic obstructive pulmonary disease) (HCC)    Herpes zoster    Hypertension    Neuralgia    Past Surgical History:  Procedure Laterality Date   LEG SURGERY      Allergies  Allergen Reactions   Aspirin Shortness Of Breath   Nsaids Shortness  Of Breath and Anaphylaxis    Throat swelling, shortness of breath   Sulfa Antibiotics Other (See Comments)    Stomach pain Stomach pain    Ceftriaxone Itching    Itching and redness near infusion site.    Codeine Other (See Comments)    Short of breath    Ciprofloxacin Hives   Penicillins     Has patient had a PCN reaction causing immediate rash, facial/tongue/throat swelling, SOB or lightheadedness with hypotension: Unknown Has patient had a PCN reaction causing severe rash involving mucus membranes or skin necrosis: Unknown Has patient had a PCN reaction that required hospitalization: Unknown Has patient had a PCN reaction occurring within the last 10 years: Unknown If all of the above answers are "NO", then may proceed with Cephalosporin use.  Other reaction(s): Angioedema (ALLERGY/intolerance) Hives, swelling   Phenol     Other reaction(s): Angioedema (ALLERGY/intolerance), Other (See Comments) Spasms, nerve symptoms Spasms, nerve symptoms  Other reaction(s): Angioedema (ALLERGY/intolerance) Spasms, nerve symptoms   Promethazine Hcl     Outpatient Encounter Medications as of 05/22/2022  Medication Sig   acetaminophen (TYLENOL) 650 MG CR tablet Take 650 mg by mouth every 8 (eight) hours as needed for pain.   cyanocobalamin (,VITAMIN B-12,) 1000 MCG/ML injection Inject 1,000 mcg into the skin every 30 (thirty) days.    diclofenac Sodium (VOLTAREN ARTHRITIS PAIN) 1 % GEL Apply 2 g topically 2 (two)  times daily.   enoxaparin (LOVENOX) 40 MG/0.4ML injection Inject 0.4 mLs (40 mg total) into the skin daily for 8 days.   Fluticasone-Salmeterol (ADVAIR) 500-50 MCG/DOSE AEPB Inhale 1 puff into the lungs 2 (two) times daily.   gabapentin (NEURONTIN) 100 MG capsule Take 1 capsule (100 mg total) by mouth at bedtime.   guaiFENesin (MUCINEX) 600 MG 12 hr tablet Take 1 tablet (600 mg total) by mouth 2 (two) times daily.   metoprolol tartrate (LOPRESSOR) 25 MG tablet Take 1 tablet (25 mg  total) by mouth 2 (two) times daily.   polyethylene glycol (MIRALAX / GLYCOLAX) packet Take 17 g by mouth daily as needed.   predniSONE (DELTASONE) 20 MG tablet Take 0.5 tablets (10 mg total) by mouth daily with breakfast for 3 days.   traMADol (ULTRAM) 50 MG tablet Take 0.5 tablets (25 mg total) by mouth daily as needed for up to 5 days (Before therapy).   No facility-administered encounter medications on file as of 05/22/2022.    Review of Systems  Unable to perform ROS: Dementia   Immunization History  Administered Date(s) Administered   Influenza Whole 06/07/2008   Influenza, High Dose Seasonal PF 07/19/2018   Pertinent  Health Maintenance Due  Topic Date Due   DEXA SCAN  Never done   INFLUENZA VACCINE  04/09/2022      05/16/2022   10:00 AM 05/16/2022    9:59 PM 05/17/2022    9:00 AM 05/17/2022    8:10 PM 05/18/2022   10:00 AM  Fall Risk  Patient Fall Risk Level High fall risk High fall risk High fall risk High fall risk High fall risk   Functional Status Survey:    There were no vitals filed for this visit. There is no height or weight on file to calculate BMI. Physical Exam Vitals and nursing note (Patient visiting son, has skipped meals) reviewed.  Neurological:     Mental Status: She is alert.   Labs reviewed: Recent Labs    01/11/22 1333 05/12/22 0930 05/14/22 0613 05/16/22 0638  NA 136 140 137 139  K 4.5 4.0 4.0 4.0  CL 100 108 101 103  CO2 '26 28 27 28  '$ GLUCOSE 117* 99 92 99  BUN 23 11 25* 31*  CREATININE 0.67 0.56 1.00 0.64  CALCIUM 9.3 8.6* 8.6* 8.9  MG 2.4  --   --   --    Recent Labs    01/11/22 1333  AST 21  ALT 12  ALKPHOS 37*  BILITOT 0.9  PROT 7.5  ALBUMIN 4.0   Recent Labs    05/12/22 0930 05/14/22 0613 05/16/22 0638 05/17/22 0620 05/18/22 0631  WBC 6.6   < > 10.4 8.7 9.2  NEUTROABS 4.1  --   --   --   --   HGB 11.7*   < > 10.1* 9.6* 10.1*  HCT 37.7   < > 31.4* 30.1* 31.6*  MCV 89.3   < > 88.7 88.0 87.1  PLT 225   < > 235 230 268    < > = values in this interval not displayed.   Lab Results  Component Value Date   TSH 1.424 02/21/2018   Lab Results  Component Value Date   HGBA1C 5.2 07/19/2018   Lab Results  Component Value Date   CHOL  04/15/2008    193        ATP III CLASSIFICATION:  <200     mg/dL   Desirable  200-239  mg/dL  Borderline High  >=240    mg/dL   High   HDL 48 04/15/2008   LDLCALC (H) 04/15/2008    123        Total Cholesterol/HDL:CHD Risk Coronary Heart Disease Risk Table                     Men   Women  1/2 Average Risk   3.4   3.3   TRIG 111 04/15/2008   CHOLHDL 4.0 04/15/2008    Significant Diagnostic Results in last 30 days:  DG Chest Port 1 View  Result Date: 05/13/2022 CLINICAL DATA:  Wheezing. EXAM: PORTABLE CHEST 1 VIEW COMPARISON:  Jan 11, 2022. FINDINGS: Stable cardiomediastinal silhouette. Stable elevated left hemidiaphragm is noted. Left lung is clear. Mild right basilar atelectasis or infiltrate is noted. Bony thorax is unremarkable. IMPRESSION: Mild right basilar subsegmental atelectasis or infiltrate is noted. Elevated left hemidiaphragm. Aortic Atherosclerosis (ICD10-I70.0). Electronically Signed   By: Marijo Conception M.D.   On: 05/13/2022 10:54   CT FEMUR LEFT WO CONTRAST  Result Date: 05/12/2022 CLINICAL DATA:  Leg pain EXAM: CT OF THE LOWER LEFT EXTREMITY WITHOUT CONTRAST TECHNIQUE: Multidetector CT imaging of the lower left extremity was performed according to the standard protocol. RADIATION DOSE REDUCTION: This exam was performed according to the departmental dose-optimization program which includes automated exposure control, adjustment of the mA and/or kV according to patient size and/or use of iterative reconstruction technique. COMPARISON:  None Available. FINDINGS: Bones/Joint/Cartilage Diffuse osteopenia. There is an obliquely oriented nondisplaced fracture of the distal femoral diaphysis. 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 There is moderate medial and patellofemoral compartment osteoarthritis. There is no evidence of fracture extension to the articular/chondral surface of the knee. There is a trace left knee joint effusion. There is no evidence of acute left hip fracture or fracture in the visualized pelvis. Ligaments Suboptimally assessed by CT. Muscles and Tendons No acute myotendinous abnormality by CT. No intramuscular collection. Soft tissues No focal fluid collection. Atherosclerotic disease of the lower extremity vasculature. Distension of the urinary bladder. Sigmoid diverticulosis. Moderate rectal stool burden. IMPRESSION: Obliquely oriented nondisplaced fracture of the distal femoral diaphysis. No evidence of acute left hip fracture or fracture in the visualized pelvis. 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 Electronically Signed   By: Maurine Simmering M.D.   On: 05/12/2022 12:43   CT Head Wo Contrast  Addendum Date: 05/12/2022   ADDENDUM REPORT: 05/12/2022 10:59 ADDENDUM: As noted in the body of the report, there is mucosal thickening in the paranasal sinuses with tiny air-fluid levels noted in both maxillary sinuses. These imaging features can be seen in the setting of acute on chronic paranasal sinusitis. Electronically Signed   By: Misty Stanley M.D.   On: 05/12/2022 10:59   Result Date: 05/12/2022 CLINICAL DATA:  Status post fall. EXAM: CT HEAD WITHOUT CONTRAST CT CERVICAL SPINE WITHOUT CONTRAST TECHNIQUE: Multidetector CT imaging of the head and cervical spine was performed following the standard protocol without intravenous contrast. Multiplanar CT image reconstructions of the cervical spine were also generated. RADIATION DOSE REDUCTION: This exam was performed according to the departmental dose-optimization program which  includes automated exposure control, adjustment of the mA and/or kV according to patient size and/or use of iterative reconstruction technique. COMPARISON:  His CT 05/25/2019. FINDINGS: CT HEAD FINDINGS Brain:  There is no evidence for acute hemorrhage, hydrocephalus, or abnormal extra-axial fluid collection. 13 x 13 x 13 mm calcified extra-axial para falcine lesion is identified in the high frontal parietal region (axial 29/3 and coronal 36/4), stable since prior. No evidence for mass effect or adjacent edema. Old right parietooccipital lobe infarct evident, new since prior study. Age indeterminate low-density in the left occipital lobe is compatible with age indeterminate infarct, also new since prior. Diffuse loss of parenchymal volume is consistent with atrophy. Patchy low attenuation in the deep hemispheric and periventricular white matter is nonspecific, but likely reflects chronic microvascular ischemic demyelination. Vascular: No hyperdense vessel or unexpected calcification. Skull: No evidence for fracture. No worrisome lytic or sclerotic lesion. Sinuses/Orbits: Mucosal thickening noted in the maxillary sinuses bilaterally with associated tiny air-fluid levels. Scattered opacification of ethmoid air cells is associated with mucosal thickening in both frontal and sphenoid sinuses. No mastoid effusion. Other: None. CT CERVICAL SPINE FINDINGS Alignment: Straightening of normal cervical lordosis. Trace anterolisthesis of C3 on 4 is compatible with the facet degeneration at this level. Skull base and vertebrae: No acute fracture. No primary bone lesion or focal pathologic process. Soft tissues and spinal canal: No prevertebral fluid or swelling. No visible canal hematoma. Bilateral thyroid nodules evident measuring up to 2.2 cm on the left. Disc levels: Marked loss of disc height is seen at all levels from C3-4 C6-7. Upper chest: 7 mm right apical nodule visible on 69/2. This is stable since 04/22/2018 consistent  with benign etiology. No followup recommended. Other: None. IMPRESSION: 1. Age indeterminate low-density in the left occipital lobe is new since the prior study and compatible with age indeterminate infarct. MRI could be used to further evaluate as clinically indicated. 2. Right parietooccipital lobe infarct appears chronic but is new since prior study. 3. 13 x 13 x 13 mm calcified extra-axial para falcine lesion towards the vertex, stable since prior and likely a meningioma. No mass effect or adjacent edema. 4. No cervical spine fracture or subluxation. 5. Advanced degenerative changes in the cervical spine. 6. Bilateral thyroid nodules measuring up to 2.2 cm on the left. Recommend thyroid US (ref: J Am Coll Radiol. 2015 Feb;12(2): 143-50). Electronically Signed: By: Misty Stanley M.D. On: 05/12/2022 10:27   CT Cervical Spine Wo Contrast  Addendum Date: 05/12/2022   ADDENDUM REPORT: 05/12/2022 10:59 ADDENDUM: As noted in the body of the report, there is mucosal thickening in the paranasal sinuses with tiny air-fluid levels noted in both maxillary sinuses. These imaging features can be seen in the setting of acute on chronic paranasal sinusitis. Electronically Signed   By: Misty Stanley M.D.   On: 05/12/2022 10:59   Result Date: 05/12/2022 CLINICAL DATA:  Status post fall. EXAM: CT HEAD WITHOUT CONTRAST CT CERVICAL SPINE WITHOUT CONTRAST TECHNIQUE: Multidetector CT imaging of the head and cervical spine was performed following the standard protocol without intravenous contrast. Multiplanar CT image reconstructions of the cervical spine were also generated. RADIATION DOSE REDUCTION: This exam was performed according to the departmental dose-optimization program which includes automated exposure control, adjustment of the mA and/or kV according to patient size and/or use of iterative reconstruction technique. COMPARISON:  His CT 05/25/2019. FINDINGS: CT HEAD FINDINGS Brain: There is no evidence for acute  hemorrhage, hydrocephalus, or abnormal extra-axial fluid collection. 13 x 13 x 13 mm calcified extra-axial para falcine lesion is identified in the high frontal parietal region (axial 29/3 and coronal 36/4), stable since prior. No evidence for mass effect or  adjacent edema. Old right parietooccipital lobe infarct evident, new since prior study. Age indeterminate low-density in the left occipital lobe is compatible with age indeterminate infarct, also new since prior. Diffuse loss of parenchymal volume is consistent with atrophy. Patchy low attenuation in the deep hemispheric and periventricular white matter is nonspecific, but likely reflects chronic microvascular ischemic demyelination. Vascular: No hyperdense vessel or unexpected calcification. Skull: No evidence for fracture. No worrisome lytic or sclerotic lesion. Sinuses/Orbits: Mucosal thickening noted in the maxillary sinuses bilaterally with associated tiny air-fluid levels. Scattered opacification of ethmoid air cells is associated with mucosal thickening in both frontal and sphenoid sinuses. No mastoid effusion. Other: None. CT CERVICAL SPINE FINDINGS Alignment: Straightening of normal cervical lordosis. Trace anterolisthesis of C3 on 4 is compatible with the facet degeneration at this level. Skull base and vertebrae: No acute fracture. No primary bone lesion or focal pathologic process. Soft tissues and spinal canal: No prevertebral fluid or swelling. No visible canal hematoma. Bilateral thyroid nodules evident measuring up to 2.2 cm on the left. Disc levels: Marked loss of disc height is seen at all levels from C3-4 C6-7. Upper chest: 7 mm right apical nodule visible on 69/2. This is stable since 04/22/2018 consistent with benign etiology. No followup recommended. Other: None. IMPRESSION: 1. Age indeterminate low-density in the left occipital lobe is new since the prior study and compatible with age indeterminate infarct. MRI could be used to further  evaluate as clinically indicated. 2. Right parietooccipital lobe infarct appears chronic but is new since prior study. 3. 13 x 13 x 13 mm calcified extra-axial para falcine lesion towards the vertex, stable since prior and likely a meningioma. No mass effect or adjacent edema. 4. No cervical spine fracture or subluxation. 5. Advanced degenerative changes in the cervical spine. 6. Bilateral thyroid nodules measuring up to 2.2 cm on the left. Recommend thyroid US (ref: J Am Coll Radiol. 2015 Feb;12(2): 143-50). Electronically Signed: By: Misty Stanley M.D. On: 05/12/2022 10:27   DG Hip Unilat W or Wo Pelvis 2-3 Views Left  Result Date: 05/12/2022 CLINICAL DATA:  Pain after fall EXAM: DG HIP (WITH OR WITHOUT PELVIS) 2-3V LEFT COMPARISON:  None Available. FINDINGS: There is no evidence of hip fracture or dislocation. There is no evidence of arthropathy or other focal bone abnormality. IMPRESSION: Negative. Electronically Signed   By: Dorise Bullion III M.D.   On: 05/12/2022 10:26   DG Knee 2 Views Left  Result Date: 05/12/2022 CLINICAL DATA:  Pain after fall EXAM: LEFT KNEE - 1-2 VIEW COMPARISON:  None Available. FINDINGS: A lucency crossing the distal femoral diaphysis is consistent with a nondisplaced fracture. No other fractures. No dislocations. No joint effusion. Vascular calcifications. IMPRESSION: A lucency crossing the distal femoral diaphysis is most consistent with a nondisplaced fracture only well seen on one view. Electronically Signed   By: Dorise Bullion III M.D.   On: 05/12/2022 10:25    Assessment/Plan 1. Severe late onset Alzheimer's dementia without behavioral disturbance, psychotic disturbance, mood disturbance, or anxiety ([place]) Discussed with son concern for change in patient's interactions and PO intake. He discusses her previous state and wonders if she has just had good and bad days. Discussed my concern for her overall picture given 20 lb weight loss in the last year and what that  means in reference to her physiologic reserve in the setting of her femoral fracture. Some concern this fracture served as a sentinel moment for her decline. At this time, patient's son and  HCPOA were not interested in discussing palliative or hospice care.    Family/ staff Communication: Son, [name] and nursing staff  Labs/tests ordered:  none  I spent 17 minutes of face to face time discussing current condition and goals of care.   Tomasa Rand, MD, [facility] [phone]

## 2022-05-24 ENCOUNTER — Other Ambulatory Visit (SKILLED_NURSING_FACILITY): Payer: Self-pay | Admitting: Student

## 2022-05-24 ENCOUNTER — Encounter: Payer: Self-pay | Admitting: Student

## 2022-05-24 DIAGNOSIS — F02C0 Dementia in other diseases classified elsewhere, severe, without behavioral disturbance, psychotic disturbance, mood disturbance, and anxiety: Secondary | ICD-10-CM

## 2022-05-24 DIAGNOSIS — G301 Alzheimer's disease with late onset: Secondary | ICD-10-CM

## 2022-05-24 NOTE — Progress Notes (Signed)
Discussed with Cande Mastropietro (son and HCPOA) patient's current status of decreased PO intake and inability to participate in meaningful therapy. Discussed concern that her current state is likely a reflection of progression of her underlying alzheimer's disease. Therapy states they will give 5 days to participate and if she does not they will sign off. Discussed concerns for overall state with son. Discussed option of staying self-pay, staying with palliative care/hospice, vs returning home. He would love for her to return to her home functional status. Completed a MOST form. Outlined ACP note available as well. At this time will plan for consultatoin with palliative care to continue conversation regarding whether this is the time to initiate hospice or palliative given Ms. Well's current functional status.   I spent an hour in face to face discussion, chart review, and education regarding the plan of care for this patient.   Tomasa Rand, MD, [facility] [phone]

## 2022-05-27 ENCOUNTER — Other Ambulatory Visit: Payer: Self-pay | Admitting: Student

## 2022-05-27 ENCOUNTER — Telehealth: Payer: Self-pay | Admitting: Student

## 2022-05-27 DIAGNOSIS — F0390 Unspecified dementia without behavioral disturbance: Secondary | ICD-10-CM

## 2022-05-27 NOTE — Telephone Encounter (Signed)
Palliative NP spoke with patient's son, Dr. Rock Nephew. Palliative consult scheduled for 05/28/22. Message left at nursing station at [facility] to make them aware of visit.

## 2022-05-28 ENCOUNTER — Non-Acute Institutional Stay: Payer: PPO | Admitting: Student

## 2022-05-28 DIAGNOSIS — Z515 Encounter for palliative care: Secondary | ICD-10-CM

## 2022-05-28 DIAGNOSIS — R634 Abnormal weight loss: Secondary | ICD-10-CM

## 2022-05-28 DIAGNOSIS — R531 Weakness: Secondary | ICD-10-CM

## 2022-05-28 DIAGNOSIS — E43 Unspecified severe protein-calorie malnutrition: Secondary | ICD-10-CM

## 2022-05-28 NOTE — Progress Notes (Signed)
[facility] Palliative Care Consult Note Telephone: [phone]  Fax: [phone]   Date of encounter: 05/28/22 9:46 AM PATIENT NAME: Janice Hoffman [id number]   [phone] (home)  DOB: [birth year] MRN: [id number] PRIMARY CARE PROVIDER:    Baxter Hire, MD,  [place] [address] [phone]  REFERRING PROVIDER:   Baxter Hire, MD [place],  [place] [address] [phone]  RESPONSIBLE PARTY:    Contact Information     Name Relation Home Work Mobile   Janice, Hoffman [phone]  [phone]   Janice, Hoffman Relative   [phone]   Janice, Hoffman   [phone]        I met face to face with patient and family in the facility. Palliative Care was asked to follow this patient by consultation request of  Janice Hoffman to address advance care planning and complex medical decision making. This is the initial visit.                                     ASSESSMENT AND PLAN / RECOMMENDATIONS:   Advance Care Planning/Goals of Care: Goals include to maximize quality of life and symptom management. Patient/health care surrogate gave his/her permission to discuss.Our advance care planning conversation included a discussion about:    The value and importance of advance care planning  Experiences with loved ones who have been seriously ill or have died  Exploration of personal, cultural or spiritual beliefs that might influence medical decisions  Exploration of goals of care in the event of a sudden injury or illness  CODE STATUS: DNR  Education provided on Palliative Medicine vs. Hospice services. We discussed comfort path. Patient currently receiving skilled therapy at SNF; her participation has been poor. We discussed transition to hospice when skilled therapy ends. Son is considering patient transitioning to long term care at facility vs. Returning home.  Recommend 24/7 support given patient's current status. Palliative Medicine will provide ongoing support, symptom management as needed.   Symptom Management/Plan:  Generalized weakness, debility-secondary to femur fracture, dementia, CVA, COPD. Patient dependent for all adl's. She is currently receiving therapy. Staff to continue anticipating care needs. Son is in agreement with transitioning to hospice when skilled care ends. He is is also considering patient staying at facility long term or going home with hospice. Facility SW and nurse made aware.  Protein calorie malnutrition, weight loss-patient weight down to 108 pounds; she was 128 a year ago. She has poor appetite, now being fed. At risk for aspiration; aspiration precautions in place. Continue assisting with feeding, offer foods patient enjoys.    Follow up Palliative Care Visit: Palliative care will continue to follow for complex medical decision making, advance care planning, and clarification of goals. Return in 2 weeks or prn.  I spent 75 minutes providing this consultation. More than 50% of the time in this consultation was spent in counseling and care coordination.  This visit was coded based on medical decision making (MDM).  PPS: 30%, weak  HOSPICE ELIGIBILITY/DIAGNOSIS: dementia, protein calorie malnutrition, femur fracture.   Chief Complaint: Palliative Medicine initial consult.   HISTORY OF PRESENT ILLNESS:  Janice Hoffman is a 88 y.o. year old female  with closed displaced left femur fracture, dementia w/behavioral disturbance, CVA, COPD, essential hypertension, pernicious anemia, AKI, osteoporosis.  Patient currently at [facility]. She  Has lived with her son and daughter in law past 5 years. She was on ventilator in 2020, ? Covid-19 infection. Patient ambulatory up until recent fall with hospitalization. She required some assistance with adl's, urinary incontinence. She had caregivers in the home during the past year.  Patient is now with increased weakness, unable to sit up to bed side. Poor therapy participation. She is dependent for all adl's. Now being fed. Notable weight loss. She is sleeping more throughout the day. Communication has declined; speaking just few words. Has had several urinary tract infections in the past year.   Patient observed sitting up in bed, being fed by her daughter in law. She is coughing with eating yogurt. Patient makes eye contact briefly and sleeps throughout most of visit. She does not exhibit signs of pain during visit.   History obtained from review of EMR, discussion with primary team, and interview with family, facility staff/caregiver and/or Janice Hoffman.  I reviewed available labs, medications, imaging, studies and related documents from the EMR.  Records reviewed and summarized above.   ROS  Unable to contribute d/t her dementia   Physical Exam: Constitutional: NAD General: frail appearing, thin, ill appearing EYES: anicteric sclera, lids intact, no discharge  ENMT: intact hearing, oral mucous membranes moist CV: S1S2, RRR,  LE edema Pulmonary: no increased work of breathing, non productive cough, room air Abdomen:  normo-active BS + 4 quadrants, soft and non tender, no ascites GU: deferred MSK: + sarcopenia, non-ambulatory Skin: warm and dry, no rashes or wounds on visible skin Neuro: +generalized weakness, cognitive impairment Psych: non-anxious affect, A and O x to person Hem/lymph/immuno: no widespread bruising CURRENT PROBLEM LIST:  Patient Active Problem List   Diagnosis Date Noted   History of delirium 05/20/2022   Closed fracture of left distal femur ([place]) 05/14/2022   Acute respiratory failure with hypoxia (HCC) 05/14/2022   Drop in hemoglobin 05/14/2022   AKI (acute kidney injury) ([place]) 05/14/2022   Femur fracture, left ([place]) 05/12/2022   History of CVA (cerebrovascular accident) 05/12/2022   Constipation 07/18/2019   Aspiration pneumonia ([place])  07/18/2019   Dementia without behavioral disturbance ([place])    Palliative care by specialist    Goals of care, counseling/discussion    Protein-calorie malnutrition, severe 09/16/2018   COPD with acute exacerbation ([place]) 03/14/2018   New onset a-fib ([place]) 06/15/2017   Near syncope 05/31/2017   Essential hypertension 01/05/2017   Dysphagia 01/05/2017   Osteoarthritis 10/12/2014   Osteoporosis, post-menopausal 10/12/2014   Pernicious anemia 06/07/2014   Vitamin D deficiency 06/07/2014   Post-traumatic osteoarthritis of left knee 05/24/2014   Pain in joint, lower leg 08/03/2013   POSTHERPETIC NEURALGIA 05/12/2008   B12 DEFICIENCY 04/26/2008   CONSTIPATION 04/26/2008   LEG EDEMA, BILATERAL 04/26/2008   PAST MEDICAL HISTORY:  Active Ambulatory Problems    Diagnosis Date Noted   POSTHERPETIC NEURALGIA 05/12/2008   B12 DEFICIENCY 04/26/2008   CONSTIPATION 04/26/2008   LEG EDEMA, BILATERAL 04/26/2008   Essential hypertension 01/05/2017   Dysphagia 01/05/2017   Near syncope 05/31/2017   New onset a-fib ([place]) 06/15/2017   COPD with acute exacerbation ([place]) 03/14/2018   Osteoarthritis 10/12/2014   Osteoporosis, post-menopausal 10/12/2014   Pain in joint, lower leg 08/03/2013   Pernicious anemia 06/07/2014   Post-traumatic osteoarthritis of left knee 05/24/2014   Vitamin D deficiency 06/07/2014   Protein-calorie malnutrition, severe 09/16/2018   Palliative care by specialist    Goals of care, counseling/discussion    Dementia without behavioral  disturbance ([place])    Constipation 07/18/2019   Aspiration pneumonia ([place]) 07/18/2019   Femur fracture, left ([place]) 05/12/2022   History of CVA (cerebrovascular accident) 05/12/2022   Closed fracture of left distal femur ([place]) 05/14/2022   Acute respiratory failure with hypoxia ([place]) 05/14/2022   Drop in hemoglobin 05/14/2022   AKI (acute kidney injury) ([place]) 05/14/2022   History of delirium 05/20/2022   Resolved Ambulatory Problems     Diagnosis Date Noted   HERPES ZOSTER 04/26/2008   ELEVATED BLOOD PRESSURE 05/12/2008   COPD exacerbation ([place]) 03/15/2016   CAP (community acquired pneumonia) 08/18/2017   Acute on chronic respiratory failure with hypoxemia ([place]) 02/22/2018   Sepsis ([place]) 07/18/2018   Sepsis due to pneumonia ([place]) 09/15/2018   Pneumonia of both lower lobes due to infectious organism    Acute respiratory distress    Shortness of breath 07/17/2019   Closed displaced fracture of distal epiphysis of left femur with routine healing 05/13/2022   Past Medical History:  Diagnosis Date   Asthma    COPD (chronic obstructive pulmonary disease) (HCC)    Herpes zoster    Hypertension    Neuralgia    SOCIAL HX:  Social History   Tobacco Use   Smoking status: Never   Smokeless tobacco: Never  Substance Use Topics   Alcohol use: No    Alcohol/week: 0.0 standard drinks of alcohol   FAMILY HX:  Family History  Problem Relation Age of Onset   COPD Sister       ALLERGIES:  Allergies  Allergen Reactions   Aspirin Shortness Of Breath   Nsaids Shortness Of Breath and Anaphylaxis    Throat swelling, shortness of breath   Sulfa Antibiotics Other (See Comments)    Stomach pain Stomach pain    Ceftriaxone Itching    Itching and redness near infusion site.    Codeine Other (See Comments)    Short of breath    Ciprofloxacin Hives   Penicillins     Has patient had a PCN reaction causing immediate rash, facial/tongue/throat swelling, SOB or lightheadedness with hypotension: Unknown Has patient had a PCN reaction causing severe rash involving mucus membranes or skin necrosis: Unknown Has patient had a PCN reaction that required hospitalization: Unknown Has patient had a PCN reaction occurring within the last 10 years: Unknown If all of the above answers are "NO", then may proceed with Cephalosporin use.  Other reaction(s): Angioedema (ALLERGY/intolerance) Hives, swelling   Phenol     Other reaction(s):  Angioedema (ALLERGY/intolerance), Other (See Comments) Spasms, nerve symptoms Spasms, nerve symptoms  Other reaction(s): Angioedema (ALLERGY/intolerance) Spasms, nerve symptoms   Promethazine Hcl      PERTINENT MEDICATIONS:  Outpatient Encounter Medications as of 05/28/2022  Medication Sig   acetaminophen (TYLENOL) 650 MG CR tablet Take 650 mg by mouth every 8 (eight) hours as needed for pain.   cyanocobalamin (,VITAMIN B-12,) 1000 MCG/ML injection Inject 1,000 mcg into the skin every 30 (thirty) days.    diclofenac Sodium (VOLTAREN ARTHRITIS PAIN) 1 % GEL Apply 2 g topically 2 (two) times daily.   enoxaparin (LOVENOX) 40 MG/0.4ML injection Inject 0.4 mLs (40 mg total) into the skin daily for 8 days.   Fluticasone-Salmeterol (ADVAIR) 500-50 MCG/DOSE AEPB Inhale 1 puff into the lungs 2 (two) times daily.   gabapentin (NEURONTIN) 100 MG capsule Take 1 capsule (100 mg total) by mouth at bedtime.   guaiFENesin (MUCINEX) 600 MG 12 hr tablet Take 1 tablet (600 mg total) by mouth 2 (  two) times daily.   metoprolol tartrate (LOPRESSOR) 25 MG tablet Take 1 tablet (25 mg total) by mouth 2 (two) times daily.   polyethylene glycol (MIRALAX / GLYCOLAX) packet Take 17 g by mouth daily as needed.   No facility-administered encounter medications on file as of 05/28/2022.   Thank you for the opportunity to participate in the care of Ms. Riggan.  The palliative care team will continue to follow. Please call our office at [phone] if we can be of additional assistance.   Ezekiel Slocumb, NP   COVID-19 PATIENT SCREENING TOOL Asked and negative response unless otherwise noted:  Have you had symptoms of covid, tested positive or been in contact with someone with symptoms/positive test in the past 5-10 days? No

## 2022-06-07 ENCOUNTER — Non-Acute Institutional Stay (SKILLED_NURSING_FACILITY): Payer: PPO | Admitting: Student

## 2022-06-07 ENCOUNTER — Encounter: Payer: Self-pay | Admitting: Student

## 2022-06-07 DIAGNOSIS — S72002K Fracture of unspecified part of neck of left femur, subsequent encounter for closed fracture with nonunion: Secondary | ICD-10-CM

## 2022-06-07 NOTE — Progress Notes (Signed)
Location:   [facility] Number: [place] of Service:  SNF [phone]) Provider:  Dewayne Shorter, MD  Baxter Hire, MD  Patient Care Team: Baxter Hire, MD as PCP - General (Internal Medicine)  Extended Emergency Contact Information Primary Emergency Contact: [place]  [place] Phone: [phone] Mobile Phone: [phone] Relation: Son Secondary Emergency Contact: [name] Address: [address]          [place], [place] [address] [name] Mobile Phone: [phone] Relation: Relative  Code Status:  DNR Goals of care: Advanced Directive information    06/07/2022    2:27 PM  Advanced Directives  Does Patient Have a Medical Advance Directive? Yes  Type of Advance Directive Out of facility DNR (pink MOST or yellow form)  Does patient want to make changes to medical advance directive? No - Patient declined  Pre-existing out of facility DNR order (yellow form or pink MOST form) Pink MOST form placed in chart (order not valid for inpatient use)     Chief Complaint  Patient presents with   Acute Visit    Follow up fracture    HPI:  Pt is a 88 y.o. female seen today for an acute visit for notable displaced fracture of the distal femur. Initial fracture on 9/3 and at follow up appointment on 9/20 was noted to have displaced fracture.  Patient is oriented to self however unable to state birthdate or location.  Patient denies pain at this time.  She states "I would be able to take a nap if you would leave me alone".   Past Medical History:  Diagnosis Date   Asthma    Closed displaced fracture of distal epiphysis of left femur with routine healing 05/13/2022   COPD (chronic obstructive pulmonary disease) (HCC)    Herpes zoster    Hypertension    Neuralgia    Past Surgical History:  Procedure Laterality Date   LEG SURGERY      Allergies  Allergen Reactions   Aspirin Shortness Of Breath    Nsaids Shortness Of Breath and Anaphylaxis    Throat swelling, shortness of breath   Sulfa Antibiotics Other (See Comments)    Stomach pain Stomach pain    Ceftriaxone Itching    Itching and redness near infusion site.    Codeine Other (See Comments)    Short of breath    Ciprofloxacin Hives   Penicillins     Has patient had a PCN reaction causing immediate rash, facial/tongue/throat swelling, SOB or lightheadedness with hypotension: Unknown Has patient had a PCN reaction causing severe rash involving mucus membranes or skin necrosis: Unknown Has patient had a PCN reaction that required hospitalization: Unknown Has patient had a PCN reaction occurring within the last 10 years: Unknown If all of the above answers are "NO", then may proceed with Cephalosporin use.  Other reaction(s): Angioedema (ALLERGY/intolerance) Hives, swelling   Phenol     Other reaction(s): Angioedema (ALLERGY/intolerance), Other (See Comments) Spasms, nerve symptoms Spasms, nerve symptoms  Other reaction(s): Angioedema (ALLERGY/intolerance) Spasms, nerve symptoms   Promethazine Hcl     Allergies as of 06/07/2022       Reactions   Aspirin Shortness Of Breath   Nsaids Shortness Of Breath, Anaphylaxis   Throat swelling, shortness of breath   Sulfa Antibiotics Other (See Comments)   Stomach pain Stomach pain   Ceftriaxone Itching   Itching and redness near infusion site.    Codeine Other (  See Comments)   Short of breath   Ciprofloxacin Hives   Penicillins    Has patient had a PCN reaction causing immediate rash, facial/tongue/throat swelling, SOB or lightheadedness with hypotension: Unknown Has patient had a PCN reaction causing severe rash involving mucus membranes or skin necrosis: Unknown Has patient had a PCN reaction that required hospitalization: Unknown Has patient had a PCN reaction occurring within the last 10 years: Unknown If all of the above answers are "NO", then may proceed with  Cephalosporin use. Other reaction(s): Angioedema (ALLERGY/intolerance) Hives, swelling   Phenol    Other reaction(s): Angioedema (ALLERGY/intolerance), Other (See Comments) Spasms, nerve symptoms Spasms, nerve symptoms Other reaction(s): Angioedema (ALLERGY/intolerance) Spasms, nerve symptoms   Promethazine Hcl         Medication List        Accurate as of June 07, 2022  2:44 PM. If you have any questions, ask your nurse or doctor.          STOP taking these medications    enoxaparin 40 MG/0.4ML injection Commonly known as: LOVENOX Stopped by: Dewayne Shorter, MD   Fluticasone-Salmeterol 500-50 MCG/DOSE Aepb Commonly known as: ADVAIR Stopped by: Dewayne Shorter, MD   gabapentin 100 MG capsule Commonly known as: Neurontin Stopped by: Dewayne Shorter, MD       TAKE these medications    acetaminophen 650 MG CR tablet Commonly known as: TYLENOL Take 650 mg by mouth every 8 (eight) hours as needed for pain.   clopidogrel 75 MG tablet Commonly known as: PLAVIX Take 75 mg by mouth daily.   cyanocobalamin 1000 MCG/ML injection Commonly known as: VITAMIN B12 Inject 1,000 mcg into the skin every 30 (thirty) days.   fluticasone 50 MCG/ACT nasal spray Commonly known as: FLONASE Place 1 spray into both nostrils in the morning and at bedtime.   guaiFENesin 600 MG 12 hr tablet Commonly known as: MUCINEX Take 1 tablet (600 mg total) by mouth 2 (two) times daily.   ipratropium-albuterol 0.5-2.5 (3) MG/3ML Soln Commonly known as: DUONEB Inhale 3 mLs into the lungs every 6 (six) hours as needed.   melatonin 3 MG Tabs tablet Take 3 mg by mouth at bedtime.   metoprolol tartrate 25 MG tablet Commonly known as: LOPRESSOR Take 1 tablet (25 mg total) by mouth 2 (two) times daily.   polyethylene glycol 17 g packet Commonly known as: MIRALAX / GLYCOLAX Take 17 g by mouth daily as needed.   traMADol 50 MG tablet Commonly known as: ULTRAM Take by mouth daily as  needed. 1/2 tablet for severe pain 30 minutes prior to therapy   Voltaren Arthritis Pain 1 % Gel Generic drug: diclofenac Sodium Apply 2 g topically 2 (two) times daily.        Review of Systems  Unable to perform ROS: Dementia    Immunization History  Administered Date(s) Administered   Influenza Inj Mdck Quad Pf 06/26/2016, 06/02/2019, 06/06/2021   Influenza Split 06/07/2014   Influenza Whole 06/07/2008   Influenza, High Dose Seasonal PF 07/19/2018   PNEUMOCOCCAL CONJUGATE-20 12/12/2021   Pneumococcal Polysaccharide-23 10/29/2012   Pertinent  Health Maintenance Due  Topic Date Due   DEXA SCAN  Never done   INFLUENZA VACCINE  04/09/2022      05/16/2022   10:00 AM 05/16/2022    9:59 PM 05/17/2022    9:00 AM 05/17/2022    8:10 PM 05/18/2022   10:00 AM  Fall Risk  Patient Fall Risk Level High fall risk High fall risk High  fall risk High fall risk High fall risk   Functional Status Survey:    Vitals:   06/07/22 1417  BP: 138/77  Pulse: (!) 59  Resp: 18  Temp: (!) 95.6 F (35.3 C)  SpO2: 93%  Weight: 107 lb (48.5 kg)  Height: '5\' 4"'$  (1.626 m)   Body mass index is 18.37 kg/m. Physical Exam Constitutional:      Comments: Thin, frail  Cardiovascular:     Rate and Rhythm: Normal rate.     Pulses: Normal pulses.  Pulmonary:     Effort: Pulmonary effort is normal.     Breath sounds: Normal breath sounds.  Abdominal:     General: Bowel sounds are normal.     Palpations: Abdomen is soft.  Musculoskeletal:     Comments: Left leg wit significant soft tissue swelling no tenderness to palpation     Labs reviewed: Recent Labs    01/11/22 1333 05/12/22 0930 05/14/22 0613 05/16/22 0638  NA 136 140 137 139  K 4.5 4.0 4.0 4.0  CL 100 108 101 103  CO2 '26 28 27 28  '$ GLUCOSE 117* 99 92 99  BUN 23 11 25* 31*  CREATININE 0.67 0.56 1.00 0.64  CALCIUM 9.3 8.6* 8.6* 8.9  MG 2.4  --   --   --    Recent Labs    01/11/22 1333  AST 21  ALT 12  ALKPHOS 37*  BILITOT 0.9   PROT 7.5  ALBUMIN 4.0   Recent Labs    05/12/22 0930 05/14/22 0613 05/16/22 0638 05/17/22 0620 05/18/22 0631  WBC 6.6   < > 10.4 8.7 9.2  NEUTROABS 4.1  --   --   --   --   HGB 11.7*   < > 10.1* 9.6* 10.1*  HCT 37.7   < > 31.4* 30.1* 31.6*  MCV 89.3   < > 88.7 88.0 87.1  PLT 225   < > 235 230 268   < > = values in this interval not displayed.   Lab Results  Component Value Date   TSH 1.424 02/21/2018   Lab Results  Component Value Date   HGBA1C 5.2 07/19/2018   Lab Results  Component Value Date   CHOL  04/15/2008    193        ATP III CLASSIFICATION:  <200     mg/dL   Desirable  200-239  mg/dL   Borderline High  >=240    mg/dL   High   HDL 48 04/15/2008   LDLCALC (H) 04/15/2008    123        Total Cholesterol/HDL:CHD Risk Coronary Heart Disease Risk Table                     Men   Women  1/2 Average Risk   3.4   3.3   TRIG 111 04/15/2008   CHOLHDL 4.0 04/15/2008    Significant Diagnostic Results in last 30 days:  DG Chest Port 1 View  Result Date: 05/13/2022 CLINICAL DATA:  Wheezing. EXAM: PORTABLE CHEST 1 VIEW COMPARISON:  Jan 11, 2022. FINDINGS: Stable cardiomediastinal silhouette. Stable elevated left hemidiaphragm is noted. Left lung is clear. Mild right basilar atelectasis or infiltrate is noted. Bony thorax is unremarkable. IMPRESSION: Mild right basilar subsegmental atelectasis or infiltrate is noted. Elevated left hemidiaphragm. Aortic Atherosclerosis (ICD10-I70.0). Electronically Signed   By: Marijo Conception M.D.   On: 05/13/2022 10:54   CT FEMUR LEFT WO CONTRAST  Result  Date: 05/12/2022 CLINICAL DATA:  Leg pain EXAM: CT OF THE LOWER LEFT EXTREMITY WITHOUT CONTRAST TECHNIQUE: Multidetector CT imaging of the lower left extremity was performed according to the standard protocol. RADIATION DOSE REDUCTION: This exam was performed according to the departmental dose-optimization program which includes automated exposure control, adjustment of the mA and/or kV  according to patient size and/or use of iterative reconstruction technique. COMPARISON:  None Available. FINDINGS: Bones/Joint/Cartilage Diffuse osteopenia. There is an obliquely oriented nondisplaced fracture of the distal femoral diaphysis. 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 There is moderate medial and patellofemoral compartment osteoarthritis. There is no evidence of fracture extension to the articular/chondral surface of the knee. There is a trace left knee joint effusion. There is no evidence of acute left hip fracture or fracture in the visualized pelvis. Ligaments Suboptimally assessed by CT. Muscles and Tendons No acute myotendinous abnormality by CT. No intramuscular collection. Soft tissues No focal fluid collection. Atherosclerotic disease of the lower extremity vasculature. Distension of the urinary bladder. Sigmoid diverticulosis. Moderate rectal stool burden. IMPRESSION: Obliquely oriented nondisplaced fracture of the distal femoral diaphysis. No evidence of acute left hip fracture or fracture in the visualized pelvis. 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 Electronically Signed   By: Maurine Simmering M.D.   On: 05/12/2022 12:43   CT Head Wo Contrast  Addendum Date: 05/12/2022   ADDENDUM REPORT: 05/12/2022 10:59 ADDENDUM: As noted in the body of the report, there is mucosal thickening in the paranasal sinuses with tiny air-fluid levels noted in both maxillary sinuses. These imaging features can be seen in the setting of acute on chronic paranasal sinusitis. Electronically Signed   By: Misty Stanley M.D.   On: 05/12/2022 10:59   Result Date: 05/12/2022 CLINICAL DATA:  Status post fall. EXAM: CT HEAD WITHOUT CONTRAST CT CERVICAL SPINE WITHOUT CONTRAST TECHNIQUE: Multidetector  CT imaging of the head and cervical spine was performed following the standard protocol without intravenous contrast. Multiplanar CT image reconstructions of the cervical spine were also generated. RADIATION DOSE REDUCTION: This exam was performed according to the departmental dose-optimization program which includes automated exposure control, adjustment of the mA and/or kV according to patient size and/or use of iterative reconstruction technique. COMPARISON:  His CT 05/25/2019. FINDINGS: CT HEAD FINDINGS Brain: There is no evidence for acute hemorrhage, hydrocephalus, or abnormal extra-axial fluid collection. 13 x 13 x 13 mm calcified extra-axial para falcine lesion is identified in the high frontal parietal region (axial 29/3 and coronal 36/4), stable since prior. No evidence for mass effect or adjacent edema. Old right parietooccipital lobe infarct evident, new since prior study. Age indeterminate low-density in the left occipital lobe is compatible with age indeterminate infarct, also new since prior. Diffuse loss of parenchymal volume is consistent with atrophy. Patchy low attenuation in the deep hemispheric and periventricular white matter is nonspecific, but likely reflects chronic microvascular ischemic demyelination. Vascular: No hyperdense vessel or unexpected calcification. Skull: No evidence for fracture. No worrisome lytic or sclerotic lesion. Sinuses/Orbits: Mucosal thickening noted in the maxillary sinuses bilaterally with associated tiny air-fluid levels. Scattered opacification of ethmoid air cells is associated with mucosal thickening in both frontal and sphenoid sinuses. No mastoid effusion. Other: None. CT CERVICAL SPINE FINDINGS Alignment: Straightening of normal cervical lordosis. Trace anterolisthesis of C3 on 4 is compatible with the facet degeneration at this level.  Skull base and vertebrae: No acute fracture. No primary bone lesion or focal pathologic process. Soft tissues and spinal  canal: No prevertebral fluid or swelling. No visible canal hematoma. Bilateral thyroid nodules evident measuring up to 2.2 cm on the left. Disc levels: Marked loss of disc height is seen at all levels from C3-4 C6-7. Upper chest: 7 mm right apical nodule visible on 69/2. This is stable since 04/22/2018 consistent with benign etiology. No followup recommended. Other: None. IMPRESSION: 1. Age indeterminate low-density in the left occipital lobe is new since the prior study and compatible with age indeterminate infarct. MRI could be used to further evaluate as clinically indicated. 2. Right parietooccipital lobe infarct appears chronic but is new since prior study. 3. 13 x 13 x 13 mm calcified extra-axial para falcine lesion towards the vertex, stable since prior and likely a meningioma. No mass effect or adjacent edema. 4. No cervical spine fracture or subluxation. 5. Advanced degenerative changes in the cervical spine. 6. Bilateral thyroid nodules measuring up to 2.2 cm on the left. Recommend thyroid US (ref: J Am Coll Radiol. 2015 Feb;12(2): 143-50). Electronically Signed: By: Misty Stanley M.D. On: 05/12/2022 10:27   CT Cervical Spine Wo Contrast  Addendum Date: 05/12/2022   ADDENDUM REPORT: 05/12/2022 10:59 ADDENDUM: As noted in the body of the report, there is mucosal thickening in the paranasal sinuses with tiny air-fluid levels noted in both maxillary sinuses. These imaging features can be seen in the setting of acute on chronic paranasal sinusitis. Electronically Signed   By: Misty Stanley M.D.   On: 05/12/2022 10:59   Result Date: 05/12/2022 CLINICAL DATA:  Status post fall. EXAM: CT HEAD WITHOUT CONTRAST CT CERVICAL SPINE WITHOUT CONTRAST TECHNIQUE: Multidetector CT imaging of the head and cervical spine was performed following the standard protocol without intravenous contrast. Multiplanar CT image reconstructions of the cervical spine were also generated. RADIATION DOSE REDUCTION: This exam was  performed according to the departmental dose-optimization program which includes automated exposure control, adjustment of the mA and/or kV according to patient size and/or use of iterative reconstruction technique. COMPARISON:  His CT 05/25/2019. FINDINGS: CT HEAD FINDINGS Brain: There is no evidence for acute hemorrhage, hydrocephalus, or abnormal extra-axial fluid collection. 13 x 13 x 13 mm calcified extra-axial para falcine lesion is identified in the high frontal parietal region (axial 29/3 and coronal 36/4), stable since prior. No evidence for mass effect or adjacent edema. Old right parietooccipital lobe infarct evident, new since prior study. Age indeterminate low-density in the left occipital lobe is compatible with age indeterminate infarct, also new since prior. Diffuse loss of parenchymal volume is consistent with atrophy. Patchy low attenuation in the deep hemispheric and periventricular white matter is nonspecific, but likely reflects chronic microvascular ischemic demyelination. Vascular: No hyperdense vessel or unexpected calcification. Skull: No evidence for fracture. No worrisome lytic or sclerotic lesion. Sinuses/Orbits: Mucosal thickening noted in the maxillary sinuses bilaterally with associated tiny air-fluid levels. Scattered opacification of ethmoid air cells is associated with mucosal thickening in both frontal and sphenoid sinuses. No mastoid effusion. Other: None. CT CERVICAL SPINE FINDINGS Alignment: Straightening of normal cervical lordosis. Trace anterolisthesis of C3 on 4 is compatible with the facet degeneration at this level. Skull base and vertebrae: No acute fracture. No primary bone lesion or focal pathologic process. Soft tissues and spinal canal: No prevertebral fluid or swelling. No visible canal hematoma. Bilateral thyroid nodules evident measuring up to 2.2 cm on the left. Disc levels: Marked loss of  disc height is seen at all levels from C3-4 C6-7. Upper chest: 7 mm right  apical nodule visible on 69/2. This is stable since 04/22/2018 consistent with benign etiology. No followup recommended. Other: None. IMPRESSION: 1. Age indeterminate low-density in the left occipital lobe is new since the prior study and compatible with age indeterminate infarct. MRI could be used to further evaluate as clinically indicated. 2. Right parietooccipital lobe infarct appears chronic but is new since prior study. 3. 13 x 13 x 13 mm calcified extra-axial para falcine lesion towards the vertex, stable since prior and likely a meningioma. No mass effect or adjacent edema. 4. No cervical spine fracture or subluxation. 5. Advanced degenerative changes in the cervical spine. 6. Bilateral thyroid nodules measuring up to 2.2 cm on the left. Recommend thyroid US (ref: J Am Coll Radiol. 2015 Feb;12(2): 143-50). Electronically Signed: By: Misty Stanley M.D. On: 05/12/2022 10:27   DG Hip Unilat W or Wo Pelvis 2-3 Views Left  Result Date: 05/12/2022 CLINICAL DATA:  Pain after fall EXAM: DG HIP (WITH OR WITHOUT PELVIS) 2-3V LEFT COMPARISON:  None Available. FINDINGS: There is no evidence of hip fracture or dislocation. There is no evidence of arthropathy or other focal bone abnormality. IMPRESSION: Negative. Electronically Signed   By: Dorise Bullion III M.D.   On: 05/12/2022 10:26   DG Knee 2 Views Left  Result Date: 05/12/2022 CLINICAL DATA:  Pain after fall EXAM: LEFT KNEE - 1-2 VIEW COMPARISON:  None Available. FINDINGS: A lucency crossing the distal femoral diaphysis is consistent with a nondisplaced fracture. No other fractures. No dislocations. No joint effusion. Vascular calcifications. IMPRESSION: A lucency crossing the distal femoral diaphysis is most consistent with a nondisplaced fracture only well seen on one view. Electronically Signed   By: Dorise Bullion III M.D.   On: 05/12/2022 10:25    Assessment/Plan 1. 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  Given patient's current goals of care are hospice and comfort and patient denies any pain at this time will defer any interventions.  Patient has of note been moved with out the assistive lift by family members.  She is also no longer wearing a knee stabilizer which was prescribed in hospitalization due to goals of comfort.  We will continue to monitor for changes in status.   Family/ staff Communication:   Labs/tests ordered:

## 2022-06-10 ENCOUNTER — Non-Acute Institutional Stay: Payer: PPO | Admitting: Student

## 2022-06-10 DIAGNOSIS — R531 Weakness: Secondary | ICD-10-CM

## 2022-06-10 DIAGNOSIS — E43 Unspecified severe protein-calorie malnutrition: Secondary | ICD-10-CM

## 2022-06-10 DIAGNOSIS — Z515 Encounter for palliative care: Secondary | ICD-10-CM

## 2022-06-10 DIAGNOSIS — R5381 Other malaise: Secondary | ICD-10-CM

## 2022-06-10 NOTE — Progress Notes (Signed)
[facility] Palliative Care Consult Note Telephone: [phone]  Fax: [phone]    Date of encounter: 06/10/22 12:05 PM PATIENT NAME: [place] [address]   [phone] (home)  DOB: [birth year] MRN: [id number] PRIMARY CARE PROVIDER:    Baxter Hire, MD,  [place] [address] [phone]  REFERRING PROVIDER:   Baxter Hire, MD [place],  [place] [address] [phone]  RESPONSIBLE PARTY:    Contact Information     Name Relation Home Work Mobile   Amrita, Radu [phone]  [phone]   [facility] Relative   [phone]   Asmara, Backs   [phone]        I met face to face with patient in the facility. Palliative Care was asked to follow this patient by consultation request of  Dr.Victoria Beamer  to address advance care planning and complex medical decision making. This is a follow up visit.                                   ASSESSMENT AND PLAN / RECOMMENDATIONS:   Advance Care Planning/Goals of Care: Goals include to maximize quality of life and symptom management. Patient/health care surrogate gave his/her permission to discuss. Our advance care planning conversation included a discussion about:    The value and importance of advance care planning  Experiences with loved ones who have been seriously ill or have died  Exploration of personal, cultural or spiritual beliefs that might influence medical decisions  Exploration of goals of care in the event of a sudden injury or illness  CODE STATUS: DNR  Education provided on Palliative Medicine vs. Hospice. Patient's skilled days ended yesterday. Left message for son regarding proceeding with hospice evaluation; awaiting return call. Spoke with facility SW.   Symptom Management/Plan:  Generalized weakness, debility-secondary to femur fracture, dementia, CVA, COPD. Patient dependent for  all adl's. She has completed therapy. Staff to continue anticipating care needs. Left message regarding hospice evaluation as discussed on last palliative visit.   Protein calorie malnutrition, weight loss-patient weight of 107 pounds; she was 128 a year ago. Appetite continues to be poor overall; she is fed. At risk for aspiration; aspiration precautions in place. Continue assisting with feeding, offer foods patient enjoys.    Follow up Palliative Care Visit: Palliative care will continue to follow for complex medical decision making, advance care planning, and clarification of goals. Return   prn.   This visit was coded based on medical decision making (MDM).  PPS: 30%  HOSPICE ELIGIBILITY/DIAGNOSIS: TBD  Chief Complaint: Palliative Medicine follow up visit.   HISTORY OF PRESENT ILLNESS:  Janice Hoffman is a 88 y.o. year old female  with closed displaced left femur fracture, dementia w/behavioral disturbance, CVA, COPD, essential hypertension, pernicious anemia, AKI, osteoporosis.   Patient currently at [facility]. She is dependent for all adl's. Hoyer lift used for transfers. She is out of bed to w/c as tolerated. She continues to sleep throughout the day. Facility SW states patient's skilled days ended yesterday. Patient received sitting up to w/c in common area. She opens her eyes and mumbles out; few words clear. PAINAD-0.  History obtained from review of EMR, discussion with primary team, and interview with family, facility staff/caregiver and/or Ms. Johnson.  I reviewed available labs, medications, imaging, studies and related documents from the EMR.  Records reviewed and summarized above.   ROS  Unable to obtain d/t patient's dementia  Physical Exam: Weight: 107 pounds Pulse apical/irregular 88, resp 20, sats 94% on room air Constitutional: NAD General: frail appearing, thin EYES: anicteric sclera, lids intact, no discharge  ENMT: intact hearing CV: S1S2, RRR, no LE  edema Pulmonary: LCTA, diminished;  no increased work of breathing, no cough Abdomen:  normo-active BS + 4 quadrants, soft and non tender, no ascites GU: deferred MSK: + sarcopenia, non- ambulatory Skin: warm and dry, no rashes or wounds on visible skin Neuro:  + generalized weakness, + cognitive impairment Psych: non-anxious affect, A and O x to person Hem/lymph/immuno: no widespread bruising   Thank you for the opportunity to participate in the care of Ms. Cullin.  The palliative care team will continue to follow. Please call our office at [phone] if we can be of additional assistance.   Ezekiel Slocumb, NP   COVID-19 PATIENT SCREENING TOOL Asked and negative response unless otherwise noted:   Have you had symptoms of covid, tested positive or been in contact with someone with symptoms/positive test in the past 5-10 days? No

## 2022-06-14 ENCOUNTER — Telehealth: Payer: Self-pay | Admitting: Student

## 2022-06-14 NOTE — Telephone Encounter (Signed)
Palliative NP left message for son Dr. Rock Nephew regarding f/u visit and referral for hospice. Awaiting return call.

## 2022-06-17 ENCOUNTER — Encounter: Payer: Self-pay | Admitting: Student

## 2022-06-17 ENCOUNTER — Non-Acute Institutional Stay (SKILLED_NURSING_FACILITY): Payer: PPO | Admitting: Student

## 2022-06-17 DIAGNOSIS — L89512 Pressure ulcer of right ankle, stage 2: Secondary | ICD-10-CM | POA: Diagnosis not present

## 2022-06-17 DIAGNOSIS — I10 Essential (primary) hypertension: Secondary | ICD-10-CM

## 2022-06-17 NOTE — Progress Notes (Signed)
Location:  Other [facility]) Nursing Home Room Number: 112-A Place of Service:  SNF [phone]) Provider:  Tomasa Rand, MD  Patient Care Team: Baxter Hire, MD as PCP - General (Internal Medicine)  Extended Emergency Contact Information Primary Emergency Contact: [place]  [place] Phone: [phone] Mobile Phone: [phone] Relation: Son Secondary Emergency Contact: [name] Address: [address]          [place], [place] [address] [name] Mobile Phone: [phone] Relation: Relative  Code Status:  DNR Goals of care: Advanced Directive information    06/17/2022    4:21 PM  Advanced Directives  Does Patient Have a Medical Advance Directive? Yes  Type of Advance Directive Out of facility DNR (pink MOST or yellow form)  Does patient want to make changes to medical advance directive? No - Patient declined  Pre-existing out of facility DNR order (yellow form or pink MOST form) Pink MOST form placed in chart (order not valid for inpatient use)     Chief Complaint  Patient presents with   Acute Visit    Pressure wound. Vitals and medications are a reflection of [facility] EMR system, [facility]      HPI:  Pt is a 88 y.o. female seen today for an acute visit for follow up of pressure wounds. Patient is feeding herself toast with jam and drinking a protein drink. She states she is doing okay today.    Past Medical History:  Diagnosis Date   Asthma    Closed displaced fracture of distal epiphysis of left femur with routine healing 05/13/2022   COPD (chronic obstructive pulmonary disease) (HCC)    Herpes zoster    Hypertension    Neuralgia    Past Surgical History:  Procedure Laterality Date   LEG SURGERY      Allergies  Allergen Reactions   Aspirin Shortness Of Breath   Nsaids Shortness Of Breath and Anaphylaxis    Throat swelling, shortness of breath   Sulfa Antibiotics Other (See Comments)    Stomach  pain Stomach pain    Ceftriaxone Itching    Itching and redness near infusion site.    Codeine Other (See Comments)    Short of breath    Ciprofloxacin Hives   Penicillins     Has patient had a PCN reaction causing immediate rash, facial/tongue/throat swelling, SOB or lightheadedness with hypotension: Unknown Has patient had a PCN reaction causing severe rash involving mucus membranes or skin necrosis: Unknown Has patient had a PCN reaction that required hospitalization: Unknown Has patient had a PCN reaction occurring within the last 10 years: Unknown If all of the above answers are "NO", then may proceed with Cephalosporin use.  Other reaction(s): Angioedema (ALLERGY/intolerance) Hives, swelling   Phenol     Other reaction(s): Angioedema (ALLERGY/intolerance), Other (See Comments) Spasms, nerve symptoms Spasms, nerve symptoms  Other reaction(s): Angioedema (ALLERGY/intolerance) Spasms, nerve symptoms   Promethazine Hcl     Outpatient Encounter Medications as of 06/17/2022  Medication Sig   acetaminophen (TYLENOL) 650 MG CR tablet Take 650 mg by mouth every 8 (eight) hours as needed for pain.   clopidogrel (PLAVIX) 75 MG tablet Take 75 mg by mouth daily.   cyanocobalamin (,VITAMIN B-12,) 1000 MCG/ML injection Inject 1,000 mcg into the skin every 30 (thirty) days.    diclofenac Sodium (VOLTAREN ARTHRITIS PAIN) 1 % GEL Apply 2 g topically 2 (two) times daily.   guaiFENesin (MUCINEX) 600 MG 12 hr  tablet Take 1 tablet (600 mg total) by mouth 2 (two) times daily.   ipratropium-albuterol (DUONEB) 0.5-2.5 (3) MG/3ML SOLN Inhale 3 mLs into the lungs every 6 (six) hours as needed.   melatonin 3 MG TABS tablet Take 3 mg by mouth at bedtime.   metoprolol tartrate (LOPRESSOR) 25 MG tablet Take 1 tablet (25 mg total) by mouth 2 (two) times daily.   polyethylene glycol (MIRALAX / GLYCOLAX) packet Take 17 g by mouth daily as needed.   traMADol (ULTRAM) 50 MG tablet Take 25 mg by mouth daily  as needed. 30 minutes prior to therapy   [DISCONTINUED] fluticasone (FLONASE) 50 MCG/ACT nasal spray Place 1 spray into both nostrils in the morning and at bedtime.   No facility-administered encounter medications on file as of 06/17/2022.    Review of Systems  Unable to perform ROS: Dementia   Immunization History  Administered Date(s) Administered   Influenza Inj Mdck Quad Pf 06/26/2016, 06/02/2019, 06/06/2021   Influenza Split 06/07/2014   Influenza Whole 06/07/2008   Influenza, High Dose Seasonal PF 07/19/2018   PNEUMOCOCCAL CONJUGATE-20 12/12/2021   Pneumococcal Polysaccharide-23 10/29/2012   Pertinent  Health Maintenance Due  Topic Date Due   DEXA SCAN  Never done   INFLUENZA VACCINE  04/09/2022      05/16/2022   10:00 AM 05/16/2022    9:59 PM 05/17/2022    9:00 AM 05/17/2022    8:10 PM 05/18/2022   10:00 AM  Fall Risk  Patient Fall Risk Level High fall risk High fall risk High fall risk High fall risk High fall risk   Functional Status Survey:    Vitals:   06/17/22 1619  BP: (!) 150/76  Pulse: 62  Resp: 18  Temp: (!) 97.4 F (36.3 C)  SpO2: 96%  Weight: 100 lb (45.4 kg)  Height: '5\' 4"'$  (1.626 m)   Body mass index is 17.16 kg/m. Physical Exam Constitutional:      Comments: cachectic  Skin:    Comments: Photo below  Neurological:     Mental Status: She is alert.    Labs reviewed: Recent Labs    01/11/22 1333 05/12/22 0930 05/14/22 0613 05/16/22 0638  NA 136 140 137 139  K 4.5 4.0 4.0 4.0  CL 100 108 101 103  CO2 '26 28 27 28  '$ GLUCOSE 117* 99 92 99  BUN 23 11 25* 31*  CREATININE 0.67 0.56 1.00 0.64  CALCIUM 9.3 8.6* 8.6* 8.9  MG 2.4  --   --   --    Recent Labs    01/11/22 1333  AST 21  ALT 12  ALKPHOS 37*  BILITOT 0.9  PROT 7.5  ALBUMIN 4.0   Recent Labs    05/12/22 0930 05/14/22 0613 05/16/22 0638 05/17/22 0620 05/18/22 0631  WBC 6.6   < > 10.4 8.7 9.2  NEUTROABS 4.1  --   --   --   --   HGB 11.7*   < > 10.1* 9.6* 10.1*  HCT 37.7    < > 31.4* 30.1* 31.6*  MCV 89.3   < > 88.7 88.0 87.1  PLT 225   < > 235 230 268   < > = values in this interval not displayed.   Lab Results  Component Value Date   TSH 1.424 02/21/2018   Lab Results  Component Value Date   HGBA1C 5.2 07/19/2018   Lab Results  Component Value Date   CHOL  04/15/2008    193  ATP III CLASSIFICATION:  <200     mg/dL   Desirable  200-239  mg/dL   Borderline High  >=240    mg/dL   High   HDL 48 04/15/2008   LDLCALC (H) 04/15/2008    123        Total Cholesterol/HDL:CHD Risk Coronary Heart Disease Risk Table                     Men   Women  1/2 Average Risk   3.4   3.3   TRIG 111 04/15/2008   CHOLHDL 4.0 04/15/2008    Significant Diagnostic Results in last 30 days:  No results found.  Assessment/Plan 1. Pressure injury of right ankle, stage 2 ([place]) Patient has been receiving treatment for wound with padded dressing. Discussed with wound care nurse that initiation of antibiotics are not indicated given she is not showing signs of worsening pain, or vital sign instability Plan to start iodine cleansing of the wound followed by mepilex over the wound. Will continue to monitor wound weekly with wound care nurse, MD monthly.   2. Essential hypertension BP slightly above goal. However, based on goals of care will plan to defer initiation of therapy.     Family/ staff Communication: nursing and wound care nurse  Labs/tests ordered:  none  Tomasa Rand, MD, [place] [phone]

## 2022-06-19 ENCOUNTER — Non-Acute Institutional Stay (SKILLED_NURSING_FACILITY): Payer: PPO | Admitting: Student

## 2022-06-19 ENCOUNTER — Encounter: Payer: Self-pay | Admitting: Student

## 2022-06-19 DIAGNOSIS — L89512 Pressure ulcer of right ankle, stage 2: Secondary | ICD-10-CM | POA: Diagnosis not present

## 2022-06-19 DIAGNOSIS — L03115 Cellulitis of right lower limb: Secondary | ICD-10-CM | POA: Diagnosis not present

## 2022-06-19 NOTE — Progress Notes (Signed)
Location:  Other Nursing Home Room Number: 112-A Place of Service:  SNF (31) Provider:  Hal Neer, Chrystie Nose, MD  Patient Care Team: Baxter Hire, MD as PCP - General (Internal Medicine)  Extended Emergency Contact Information Primary Emergency Contact: [place]  [place] Phone: [phone] Mobile Phone: [phone] Relation: Son Secondary Emergency Contact: [name] Address: [address]          [place], [place] [address] [name] Mobile Phone: [phone] Relation: Relative  Code Status:  DNR Goals of care: Advanced Directive information    06/17/2022    4:21 PM  Advanced Directives  Does Patient Have a Medical Advance Directive? Yes  Type of Advance Directive Out of facility DNR (pink MOST or yellow form)  Does patient want to make changes to medical advance directive? No - Patient declined  Pre-existing out of facility DNR order (yellow form or pink MOST form) Pink MOST form placed in chart (order not valid for inpatient use)     Chief Complaint  Patient presents with   Urinary Tract Infection   Ankle Pain    HPI:  Pt is a 88 y.o. female seen today for an acute visit for concern for cellulitis. Nursing report purulent drainage and worsening tenderness and erythema.    Past Medical History:  Diagnosis Date   Asthma    Closed displaced fracture of distal epiphysis of left femur with routine healing 05/13/2022   COPD (chronic obstructive pulmonary disease) (HCC)    Herpes zoster    Hypertension    Neuralgia    Past Surgical History:  Procedure Laterality Date   LEG SURGERY      Allergies  Allergen Reactions   Aspirin Shortness Of Breath   Nsaids Shortness Of Breath and Anaphylaxis    Throat swelling, shortness of breath   Sulfa Antibiotics Other (See Comments)    Stomach pain Stomach pain    Ceftriaxone Itching    Itching and redness near infusion site.    Codeine Other (See Comments)    Short  of breath    Ciprofloxacin Hives   Penicillins     Has patient had a PCN reaction causing immediate rash, facial/tongue/throat swelling, SOB or lightheadedness with hypotension: Unknown Has patient had a PCN reaction causing severe rash involving mucus membranes or skin necrosis: Unknown Has patient had a PCN reaction that required hospitalization: Unknown Has patient had a PCN reaction occurring within the last 10 years: Unknown If all of the above answers are "NO", then may proceed with Cephalosporin use.  Other reaction(s): Angioedema (ALLERGY/intolerance) Hives, swelling   Phenol     Other reaction(s): Angioedema (ALLERGY/intolerance), Other (See Comments) Spasms, nerve symptoms Spasms, nerve symptoms  Other reaction(s): Angioedema (ALLERGY/intolerance) Spasms, nerve symptoms   Promethazine Hcl     Outpatient Encounter Medications as of 06/19/2022  Medication Sig   acetaminophen (TYLENOL) 650 MG CR tablet Take 650 mg by mouth every 8 (eight) hours as needed for pain.   clopidogrel (PLAVIX) 75 MG tablet Take 75 mg by mouth daily.   cyanocobalamin (,VITAMIN B-12,) 1000 MCG/ML injection Inject 1,000 mcg into the skin every 30 (thirty) days.    diclofenac Sodium (VOLTAREN ARTHRITIS PAIN) 1 % GEL Apply 2 g topically 2 (two) times daily.   guaiFENesin (MUCINEX) 600 MG 12 hr tablet Take 1 tablet (600 mg total) by mouth 2 (two) times daily.   ipratropium-albuterol (DUONEB) 0.5-2.5 (3) MG/3ML SOLN Inhale 3 mLs into the lungs every  6 (six) hours as needed.   melatonin 3 MG TABS tablet Take 3 mg by mouth at bedtime.   metoprolol tartrate (LOPRESSOR) 25 MG tablet Take 1 tablet (25 mg total) by mouth 2 (two) times daily.   polyethylene glycol (MIRALAX / GLYCOLAX) packet Take 17 g by mouth daily as needed.   traMADol (ULTRAM) 50 MG tablet Take 25 mg by mouth daily as needed. 30 minutes prior to therapy   No facility-administered encounter medications on file as of 06/19/2022.    Review of  Systems  Unable to perform ROS: Dementia    Immunization History  Administered Date(s) Administered   Influenza Inj Mdck Quad Pf 06/26/2016, 06/02/2019, 06/06/2021   Influenza Split 06/07/2014   Influenza Whole 06/07/2008   Influenza, High Dose Seasonal PF 07/19/2018   PNEUMOCOCCAL CONJUGATE-20 12/12/2021   Pneumococcal Polysaccharide-23 10/29/2012   Pertinent  Health Maintenance Due  Topic Date Due   DEXA SCAN  Never done   INFLUENZA VACCINE  04/09/2022      05/16/2022   10:00 AM 05/16/2022    9:59 PM 05/17/2022    9:00 AM 05/17/2022    8:10 PM 05/18/2022   10:00 AM  Fall Risk  Patient Fall Risk Level High fall risk High fall risk High fall risk High fall risk High fall risk   Functional Status Survey:    Vitals:   06/19/22 0942  BP: (!) 164/81  Pulse: 70  Resp: 20  Temp: (!) 97.3 F (36.3 C)  SpO2: 96%  Weight: 100 lb (45.4 kg)   Body mass index is 17.16 kg/m. Physical Exam Skin:    Comments: Right ankle with increased erythema, tenderness, induration.   Neurological:     Mental Status: She is alert.     Labs reviewed: Recent Labs    01/11/22 1333 05/12/22 0930 05/14/22 0613 05/16/22 0638  NA 136 140 137 139  K 4.5 4.0 4.0 4.0  CL 100 108 101 103  CO2 '26 28 27 28  '$ GLUCOSE 117* 99 92 99  BUN 23 11 25* 31*  CREATININE 0.67 0.56 1.00 0.64  CALCIUM 9.3 8.6* 8.6* 8.9  MG 2.4  --   --   --    Recent Labs    01/11/22 1333  AST 21  ALT 12  ALKPHOS 37*  BILITOT 0.9  PROT 7.5  ALBUMIN 4.0   Recent Labs    05/12/22 0930 05/14/22 0613 05/16/22 0638 05/17/22 0620 05/18/22 0631  WBC 6.6   < > 10.4 8.7 9.2  NEUTROABS 4.1  --   --   --   --   HGB 11.7*   < > 10.1* 9.6* 10.1*  HCT 37.7   < > 31.4* 30.1* 31.6*  MCV 89.3   < > 88.7 88.0 87.1  PLT 225   < > 235 230 268   < > = values in this interval not displayed.   Lab Results  Component Value Date   TSH 1.424 02/21/2018   Lab Results  Component Value Date   HGBA1C 5.2 07/19/2018   Lab  Results  Component Value Date   CHOL  04/15/2008    193        ATP III CLASSIFICATION:  <200     mg/dL   Desirable  200-239  mg/dL   Borderline High  >=240    mg/dL   High   HDL 48 04/15/2008   LDLCALC (H) 04/15/2008    123  Total Cholesterol/HDL:CHD Risk Coronary Heart Disease Risk Table                     Men   Women  1/2 Average Risk   3.4   3.3   TRIG 111 04/15/2008   CHOLHDL 4.0 04/15/2008    Significant Diagnostic Results in last 30 days:  No results found.  Assessment/Plan 1. Pressure injury of right ankle, stage 2 ([place]) Noted recently with concern for potential infection. Since evaluation on Friday, patient has notable spreading of erythema and increased tenderness to palpation. Start Keflex 500 mg BID for cellulitis. Patient is on hospice, however, given pain, will plan to treat wound infection. Continue wound care with daily cleaning and dressing change. If no improvement after 48 hours will consider change of Abx.   2. Cellulitis of right lower extremity Plan outlined above     Family/ staff Communication: nursing  Labs/tests ordered:  none  Tomasa Rand, MD, [place] [phone]

## 2022-07-02 ENCOUNTER — Non-Acute Institutional Stay (SKILLED_NURSING_FACILITY): Payer: PPO | Admitting: Nurse Practitioner

## 2022-07-02 ENCOUNTER — Encounter: Payer: Self-pay | Admitting: Nurse Practitioner

## 2022-07-02 DIAGNOSIS — J441 Chronic obstructive pulmonary disease with (acute) exacerbation: Secondary | ICD-10-CM

## 2022-07-02 NOTE — Progress Notes (Unsigned)
Location:  Other Nursing Home Room Number: 112-A Place of Service:  SNF (31) Provider:  Carlos American. Dewaine Oats, NP   Patient Care Team: Baxter Hire, MD as PCP - General (Internal Medicine)  Extended Emergency Contact Information Primary Emergency Contact: [place]  [place] Phone: [phone] Mobile Phone: [phone] Relation: Son Secondary Emergency Contact: [name] Address: [address]          [place], [place] [address] [name] Mobile Phone: [phone] Relation: Relative  Code Status:  DNR Goals of care: Advanced Directive information    07/02/2022    1:42 PM  Advanced Directives  Does Patient Have a Medical Advance Directive? Yes  Type of Advance Directive Out of facility DNR (pink MOST or yellow form)  Does patient want to make changes to medical advance directive? No - Patient declined  Pre-existing out of facility DNR order (yellow form or pink MOST form) Pink MOST form placed in chart (order not valid for inpatient use)     Chief Complaint  Patient presents with   Acute Visit    Wheezing and shortness of breath     HPI:  Pt is a 88 y.o. female seen today for an acute visit for    Past Medical History:  Diagnosis Date   Asthma    Closed displaced fracture of distal epiphysis of left femur with routine healing 05/13/2022   COPD (chronic obstructive pulmonary disease) ([place])    Herpes zoster    Hypertension    Neuralgia    Past Surgical History:  Procedure Laterality Date   LEG SURGERY      Allergies  Allergen Reactions   Aspirin Shortness Of Breath   Nsaids Shortness Of Breath and Anaphylaxis    Throat swelling, shortness of breath   Sulfa Antibiotics Other (See Comments)    Stomach pain Stomach pain    Ceftriaxone Itching    Itching and redness near infusion site.    Codeine Other (See Comments)    Short of breath    Ciprofloxacin Hives   Penicillins     Has patient had a PCN reaction  causing immediate rash, facial/tongue/throat swelling, SOB or lightheadedness with hypotension: Unknown Has patient had a PCN reaction causing severe rash involving mucus membranes or skin necrosis: Unknown Has patient had a PCN reaction that required hospitalization: Unknown Has patient had a PCN reaction occurring within the last 10 years: Unknown If all of the above answers are "NO", then may proceed with Cephalosporin use.  Other reaction(s): Angioedema (ALLERGY/intolerance) Hives, swelling   Phenol     Other reaction(s): Angioedema (ALLERGY/intolerance), Other (See Comments) Spasms, nerve symptoms Spasms, nerve symptoms  Other reaction(s): Angioedema (ALLERGY/intolerance) Spasms, nerve symptoms   Promethazine Hcl     Outpatient Encounter Medications as of 07/02/2022  Medication Sig   acetaminophen (TYLENOL) 650 MG CR tablet Take 650 mg by mouth every 8 (eight) hours as needed for pain.   clopidogrel (PLAVIX) 75 MG tablet Take 75 mg by mouth daily.   cyanocobalamin (,VITAMIN B-12,) 1000 MCG/ML injection Inject 1,000 mcg into the skin every 30 (thirty) days.    diclofenac Sodium (VOLTAREN ARTHRITIS PAIN) 1 % GEL Apply 2 g topically 2 (two) times daily.   guaiFENesin (MUCINEX) 600 MG 12 hr tablet Take 1 tablet (600 mg total) by mouth 2 (two) times daily.   Infant Care Products [facility]) OINT Apply 1 application  topically as needed. Apply to peri-area/sacrum   ipratropium-albuterol (DUONEB) 0.5-2.5 (3) MG/3ML  SOLN Inhale 3 mLs into the lungs every 6 (six) hours as needed.   melatonin 3 MG TABS tablet Take 3 mg by mouth at bedtime.   metoprolol tartrate (LOPRESSOR) 25 MG tablet Take 1 tablet (25 mg total) by mouth 2 (two) times daily.   polyethylene glycol (MIRALAX / GLYCOLAX) packet Take 17 g by mouth daily as needed.   traMADol (ULTRAM) 50 MG tablet Take 25 mg by mouth daily as needed. 30 minutes prior to therapy   No facility-administered encounter medications on file as of  07/02/2022.    Review of Systems  Immunization History  Administered Date(s) Administered   Influenza Inj Mdck Quad Pf 06/26/2016, 06/02/2019, 06/06/2021   Influenza Split 06/07/2014   Influenza Whole 06/07/2008   Influenza, High Dose Seasonal PF 07/19/2018   PNEUMOCOCCAL CONJUGATE-20 12/12/2021   Pneumococcal Polysaccharide-23 10/29/2012   Pertinent  Health Maintenance Due  Topic Date Due   DEXA SCAN  Never done   INFLUENZA VACCINE  04/09/2022      05/16/2022   10:00 AM 05/16/2022    9:59 PM 05/17/2022    9:00 AM 05/17/2022    8:10 PM 05/18/2022   10:00 AM  Fall Risk  Patient Fall Risk Level High fall risk High fall risk High fall risk High fall risk High fall risk   Functional Status Survey:    Vitals:   07/02/22 1340  BP: 116/71  Pulse: 79  Resp: 16  Temp: (!) 97.1 F (36.2 C)  Weight: 100 lb (45.4 kg)  Height: '5\' 4"'$  (1.626 m)   Body mass index is 17.16 kg/m. Physical Exam  Labs reviewed: Recent Labs    01/11/22 1333 05/12/22 0930 05/14/22 0613 05/16/22 0638  NA 136 140 137 139  K 4.5 4.0 4.0 4.0  CL 100 108 101 103  CO2 '26 28 27 28  '$ GLUCOSE 117* 99 92 99  BUN 23 11 25* 31*  CREATININE 0.67 0.56 1.00 0.64  CALCIUM 9.3 8.6* 8.6* 8.9  MG 2.4  --   --   --    Recent Labs    01/11/22 1333  AST 21  ALT 12  ALKPHOS 37*  BILITOT 0.9  PROT 7.5  ALBUMIN 4.0   Recent Labs    05/12/22 0930 05/14/22 0613 05/16/22 0638 05/17/22 0620 05/18/22 0631  WBC 6.6   < > 10.4 8.7 9.2  NEUTROABS 4.1  --   --   --   --   HGB 11.7*   < > 10.1* 9.6* 10.1*  HCT 37.7   < > 31.4* 30.1* 31.6*  MCV 89.3   < > 88.7 88.0 87.1  PLT 225   < > 235 230 268   < > = values in this interval not displayed.   Lab Results  Component Value Date   TSH 1.424 02/21/2018   Lab Results  Component Value Date   HGBA1C 5.2 07/19/2018   Lab Results  Component Value Date   CHOL  04/15/2008    193        ATP III CLASSIFICATION:  <200     mg/dL   Desirable  200-239  mg/dL    Borderline High  >=240    mg/dL   High   HDL 48 04/15/2008   LDLCALC (H) 04/15/2008    123        Total Cholesterol/HDL:CHD Risk Coronary Heart Disease Risk Table  Men   Women  1/2 Average Risk   3.4   3.3   TRIG 111 04/15/2008   CHOLHDL 4.0 04/15/2008    Significant Diagnostic Results in last 30 days:  No results found.  Assessment/Plan There are no diagnoses linked to this encounter.   Family/ staff Communication: ***  Labs/tests ordered:  ***

## 2022-07-03 NOTE — Progress Notes (Signed)
Location:  Other Nursing Home Room Number: 112-A Place of Service:  SNF (31)  Baxter Hire, MD  Patient Care Team: Baxter Hire, MD as PCP - General (Internal Medicine)  Extended Emergency Contact Information Primary Emergency Contact: [place]  [place] Phone: [phone] Mobile Phone: [phone] Relation: Son Secondary Emergency Contact: [name] Address: [address]          [place], [place] [address] Johnnette Litter of [place] Phone: [phone] Relation: Relative  Goals of care: Advanced Directive information    07/02/2022    1:42 PM  Advanced Directives  Does Patient Have a Medical Advance Directive? Yes  Type of Advance Directive Out of facility DNR (pink MOST or yellow form)  Does patient want to make changes to medical advance directive? No - Patient declined  Pre-existing out of facility DNR order (yellow form or pink MOST form) Pink MOST form placed in chart (order not valid for inpatient use)     Chief Complaint  Patient presents with   Acute Visit    Wheezing and shortness of breath     HPI:  Pt is a 88 y.o. female seen today for an acute visit for wheezing and shortness of breath per nursing son requesting prednisone taper as this has been helpful in the past.  Pt iwht hx of dementia, femur fracture, COPD/asthma, HTN and is at [facility]. She is under hospice care as well.  She uses mucinex twice daily routinely.    Past Medical History:  Diagnosis Date   Asthma    Closed displaced fracture of distal epiphysis of left femur with routine healing 05/13/2022   COPD (chronic obstructive pulmonary disease) ([facility])    Herpes zoster    Hypertension    Neuralgia    Past Surgical History:  Procedure Laterality Date   LEG SURGERY      Allergies  Allergen Reactions   Aspirin Shortness Of Breath   Nsaids Shortness Of Breath and Anaphylaxis    Throat swelling, shortness of  breath   Sulfa Antibiotics Other (See Comments)    Stomach pain Stomach pain    Ceftriaxone Itching    Itching and redness near infusion site.    Codeine Other (See Comments)    Short of breath    Ciprofloxacin Hives   Penicillins     Has patient had a PCN reaction causing immediate rash, facial/tongue/throat swelling, SOB or lightheadedness with hypotension: Unknown Has patient had a PCN reaction causing severe rash involving mucus membranes or skin necrosis: Unknown Has patient had a PCN reaction that required hospitalization: Unknown Has patient had a PCN reaction occurring within the last 10 years: Unknown If all of the above answers are "NO", then may proceed with Cephalosporin use.  Other reaction(s): Angioedema (ALLERGY/intolerance) Hives, swelling   Phenol     Other reaction(s): Angioedema (ALLERGY/intolerance), Other (See Comments) Spasms, nerve symptoms Spasms, nerve symptoms  Other reaction(s): Angioedema (ALLERGY/intolerance) Spasms, nerve symptoms   Promethazine Hcl     Outpatient Encounter Medications as of 07/02/2022  Medication Sig   acetaminophen (TYLENOL) 650 MG CR tablet Take 650 mg by mouth every 8 (eight) hours as needed for pain.   clopidogrel (PLAVIX) 75 MG tablet Take 75 mg by mouth daily.   cyanocobalamin (,VITAMIN B-12,) 1000 MCG/ML injection Inject 1,000 mcg into the skin every 30 (thirty) days.    diclofenac Sodium (VOLTAREN ARTHRITIS PAIN) 1 % GEL Apply 2 g topically 2 (  two) times daily.   guaiFENesin (MUCINEX) 600 MG 12 hr tablet Take 1 tablet (600 mg total) by mouth 2 (two) times daily.   Infant Care Products [facility]) OINT Apply 1 application  topically as needed. Apply to peri-area/sacrum   ipratropium-albuterol (DUONEB) 0.5-2.5 (3) MG/3ML SOLN Inhale 3 mLs into the lungs every 6 (six) hours as needed.   melatonin 3 MG TABS tablet Take 3 mg by mouth at bedtime.   metoprolol tartrate (LOPRESSOR) 25 MG tablet Take 1 tablet (25 mg total) by  mouth 2 (two) times daily.   polyethylene glycol (MIRALAX / GLYCOLAX) packet Take 17 g by mouth daily as needed.   traMADol (ULTRAM) 50 MG tablet Take 25 mg by mouth daily as needed. 30 minutes prior to therapy   No facility-administered encounter medications on file as of 07/02/2022.    Review of Systems  Unable to perform ROS: Dementia    Immunization History  Administered Date(s) Administered   Influenza Inj Mdck Quad Pf 06/26/2016, 06/02/2019, 06/06/2021   Influenza Split 06/07/2014   Influenza Whole 06/07/2008   Influenza, High Dose Seasonal PF 07/19/2018   PNEUMOCOCCAL CONJUGATE-20 12/12/2021   Pneumococcal Polysaccharide-23 10/29/2012   Pertinent  Health Maintenance Due  Topic Date Due   DEXA SCAN  Never done   INFLUENZA VACCINE  04/09/2022      05/16/2022   10:00 AM 05/16/2022    9:59 PM 05/17/2022    9:00 AM 05/17/2022    8:10 PM 05/18/2022   10:00 AM  Fall Risk  Patient Fall Risk Level High fall risk High fall risk High fall risk High fall risk High fall risk   Functional Status Survey:    Vitals:   07/02/22 1340  BP: 116/71  Pulse: 79  Resp: 16  Temp: (!) 97.1 F (36.2 C)  Weight: 100 lb (45.4 kg)  Height: '5\' 4"'$  (1.626 m)   Body mass index is 17.16 kg/m. Physical Exam Constitutional:      General: She is not in acute distress.    Appearance: She is well-developed. She is not diaphoretic.  HENT:     Head: Normocephalic and atraumatic.     Mouth/Throat:     Pharynx: No oropharyngeal exudate.  Eyes:     Conjunctiva/sclera: Conjunctivae normal.     Pupils: Pupils are equal, round, and reactive to light.  Cardiovascular:     Rate and Rhythm: Normal rate and regular rhythm.     Heart sounds: Normal heart sounds.  Pulmonary:     Effort: Pulmonary effort is normal.     Breath sounds: Decreased breath sounds (throughout) and wheezing (to bilateral upper lobes) present.  Abdominal:     General: Bowel sounds are normal.     Palpations: Abdomen is soft.   Musculoskeletal:     Cervical back: Normal range of motion and neck supple.     Right lower leg: No edema.     Left lower leg: No edema.  Skin:    General: Skin is warm and dry.  Neurological:     Mental Status: She is alert. Mental status is at baseline.  Psychiatric:        Mood and Affect: Mood normal.     Labs reviewed: Recent Labs    01/11/22 1333 05/12/22 0930 05/14/22 0613 05/16/22 0638  NA 136 140 137 139  K 4.5 4.0 4.0 4.0  CL 100 108 101 103  CO2 '26 28 27 28  '$ GLUCOSE 117* 99 92 99  BUN 23 11  25* 31*  CREATININE 0.67 0.56 1.00 0.64  CALCIUM 9.3 8.6* 8.6* 8.9  MG 2.4  --   --   --    Recent Labs    01/11/22 1333  AST 21  ALT 12  ALKPHOS 37*  BILITOT 0.9  PROT 7.5  ALBUMIN 4.0   Recent Labs    05/12/22 0930 05/14/22 0613 05/16/22 0638 05/17/22 0620 05/18/22 0631  WBC 6.6   < > 10.4 8.7 9.2  NEUTROABS 4.1  --   --   --   --   HGB 11.7*   < > 10.1* 9.6* 10.1*  HCT 37.7   < > 31.4* 30.1* 31.6*  MCV 89.3   < > 88.7 88.0 87.1  PLT 225   < > 235 230 268   < > = values in this interval not displayed.   Lab Results  Component Value Date   TSH 1.424 02/21/2018   Lab Results  Component Value Date   HGBA1C 5.2 07/19/2018   Lab Results  Component Value Date   CHOL  04/15/2008    193        ATP III CLASSIFICATION:  <200     mg/dL   Desirable  200-239  mg/dL   Borderline High  >=240    mg/dL   High   HDL 48 04/15/2008   LDLCALC (H) 04/15/2008    123        Total Cholesterol/HDL:CHD Risk Coronary Heart Disease Risk Table                     Men   Women  1/2 Average Risk   3.4   3.3   TRIG 111 04/15/2008   CHOLHDL 4.0 04/15/2008    Significant Diagnostic Results in last 30 days:  No results found.  Assessment/Plan 1. COPD exacerbation ([place]) -start prednisone taper -continue mucinex twice daily -stop adviar as she is not able to use inhaler properly -duonebs TID scheduled.  -to notify if symptoms worsen or fail to improve- can add  antibiotic if needed     Montia Haslip K. Sutherland, [facility] [phone]

## 2022-07-26 ENCOUNTER — Non-Acute Institutional Stay (SKILLED_NURSING_FACILITY): Admitting: Student

## 2022-07-26 ENCOUNTER — Encounter: Payer: Self-pay | Admitting: Student

## 2022-07-26 DIAGNOSIS — E43 Unspecified severe protein-calorie malnutrition: Secondary | ICD-10-CM

## 2022-07-26 DIAGNOSIS — J439 Emphysema, unspecified: Secondary | ICD-10-CM | POA: Diagnosis not present

## 2022-07-26 DIAGNOSIS — L89512 Pressure ulcer of right ankle, stage 2: Secondary | ICD-10-CM

## 2022-07-26 DIAGNOSIS — I4891 Unspecified atrial fibrillation: Secondary | ICD-10-CM | POA: Diagnosis not present

## 2022-07-26 DIAGNOSIS — S72402D Unspecified fracture of lower end of left femur, subsequent encounter for closed fracture with routine healing: Secondary | ICD-10-CM

## 2022-07-26 DIAGNOSIS — J3489 Other specified disorders of nose and nasal sinuses: Secondary | ICD-10-CM | POA: Diagnosis not present

## 2022-07-26 DIAGNOSIS — I10 Essential (primary) hypertension: Secondary | ICD-10-CM | POA: Diagnosis not present

## 2022-07-26 DIAGNOSIS — Z8673 Personal history of transient ischemic attack (TIA), and cerebral infarction without residual deficits: Secondary | ICD-10-CM

## 2022-07-26 DIAGNOSIS — F0390 Unspecified dementia without behavioral disturbance: Secondary | ICD-10-CM

## 2022-07-26 NOTE — Progress Notes (Signed)
Location:  Other [facility]  Nursing Home Room Number: [place] of Service:  SNF [phone]) Provider:  Dr. Amada Kingfisher, MD  Patient Care Team: Dewayne Shorter, MD as PCP - General [facility])  Extended Emergency Contact Information Primary Emergency Contact: [place]  [place] Phone: [phone] Mobile Phone: [phone] Relation: Son Secondary Emergency Contact: [name] Address: [address]          [place], [place] [address] [name] Mobile Phone: [phone] Relation: Relative  Code Status:  DNR Goals of care: Advanced Directive information    07/26/2022   11:21 AM  Advanced Directives  Does Patient Have a Medical Advance Directive? Yes  Type of Advance Directive Out of facility DNR (pink MOST or yellow form)  Does patient want to make changes to medical advance directive? No - Patient declined     Chief Complaint  Patient presents with   Medical Management of Chronic Issues    Medical Management of Chronic Issues.     HPI:  Pt is a 88 y.o. female seen today for medical management of chronic diseases.    Nursing states patient is having signficant runny nose.   Patient answers to her name. Is intermittently sleeping throughout exam. Niece and Great niece at bedside, and patient is unable to recognize them.   Past Medical History:  Diagnosis Date   Asthma    Closed displaced fracture of distal epiphysis of left femur with routine healing 05/13/2022   COPD (chronic obstructive pulmonary disease) ([facility])    Herpes zoster    Hypertension    Neuralgia    Past Surgical History:  Procedure Laterality Date   LEG SURGERY      Allergies  Allergen Reactions   Aspirin Shortness Of Breath   Nsaids Shortness Of Breath and Anaphylaxis    Throat swelling, shortness of breath   Sulfa Antibiotics Other (See Comments)    Stomach pain Stomach pain    Ceftriaxone Itching    Itching  and redness near infusion site.    Codeine Other (See Comments)    Short of breath    Ciprofloxacin Hives   Penicillins     Has patient had a PCN reaction causing immediate rash, facial/tongue/throat swelling, SOB or lightheadedness with hypotension: Unknown Has patient had a PCN reaction causing severe rash involving mucus membranes or skin necrosis: Unknown Has patient had a PCN reaction that required hospitalization: Unknown Has patient had a PCN reaction occurring within the last 10 years: Unknown If all of the above answers are "NO", then may proceed with Cephalosporin use.  Other reaction(s): Angioedema (ALLERGY/intolerance) Hives, swelling   Phenol     Other reaction(s): Angioedema (ALLERGY/intolerance), Other (See Comments) Spasms, nerve symptoms Spasms, nerve symptoms  Other reaction(s): Angioedema (ALLERGY/intolerance) Spasms, nerve symptoms   Promethazine Hcl     Outpatient Encounter Medications as of 07/26/2022  Medication Sig   acetaminophen (TYLENOL) 650 MG CR tablet Take 650 mg by mouth every 8 (eight) hours as needed for pain.   clopidogrel (PLAVIX) 75 MG tablet Take 75 mg by mouth daily.   cyanocobalamin (,VITAMIN B-12,) 1000 MCG/ML injection Inject 1,000 mcg into the skin every 30 (thirty) days.    guaiFENesin (MUCINEX) 600 MG 12 hr tablet Take 1 tablet (600 mg total) by mouth 2 (two) times daily.   Infant Care Products [facility]) OINT Apply 1 application  topically as needed. Apply to peri-area/sacrum   ipratropium-albuterol (DUONEB) 0.5-2.5 (3)  MG/3ML SOLN Inhale 3 mLs into the lungs every 6 (six) hours as needed.   melatonin 3 MG TABS tablet Take 3 mg by mouth at bedtime.   metoprolol tartrate (LOPRESSOR) 25 MG tablet Take 1 tablet (25 mg total) by mouth 2 (two) times daily.   polyethylene glycol (MIRALAX / GLYCOLAX) packet Take 17 g by mouth daily as needed.   traMADol (ULTRAM) 50 MG tablet Take 25 mg by mouth daily as needed. 30 minutes prior to therapy    [DISCONTINUED] diclofenac Sodium (VOLTAREN ARTHRITIS PAIN) 1 % GEL Apply 2 g topically 2 (two) times daily.   No facility-administered encounter medications on file as of 07/26/2022.    Review of Systems  Unable to perform ROS: Dementia    Immunization History  Administered Date(s) Administered   Influenza Inj Mdck Quad Pf 06/26/2016, 06/02/2019, 06/06/2021   Influenza Split 06/07/2014   Influenza Whole 06/07/2008   Influenza, High Dose Seasonal PF 07/19/2018   Influenza-Unspecified 06/25/2022   PNEUMOCOCCAL CONJUGATE-20 12/12/2021   Pneumococcal Polysaccharide-23 10/29/2012   Pertinent  Health Maintenance Due  Topic Date Due   DEXA SCAN  Never done   INFLUENZA VACCINE  Completed      05/16/2022   10:00 AM 05/16/2022    9:59 PM 05/17/2022    9:00 AM 05/17/2022    8:10 PM 05/18/2022   10:00 AM  Fall Risk  Patient Fall Risk Level High fall risk High fall risk High fall risk High fall risk High fall risk   Functional Status Survey:    Vitals:   07/26/22 1111  BP: 120/60  Pulse: 88  Resp: 16  Temp: (!) 95.7 F (35.4 C)  SpO2: 96%  Weight: 103 lb (46.7 kg)  Height: '5\' 4"'$  (1.626 m)   Body mass index is 17.68 kg/m. Physical Exam HENT:     Nose: Rhinorrhea present.  Cardiovascular:     Pulses: Normal pulses.  Pulmonary:     Effort: Pulmonary effort is normal.  Abdominal:     General: Abdomen is flat.     Palpations: Abdomen is soft.  Skin:    General: Skin is warm and dry.  Neurological:     Mental Status: She is alert.     Labs reviewed: Recent Labs    01/11/22 1333 05/12/22 0930 05/14/22 0613 05/16/22 0638  NA 136 140 137 139  K 4.5 4.0 4.0 4.0  CL 100 108 101 103  CO2 '26 28 27 28  '$ GLUCOSE 117* 99 92 99  BUN 23 11 25* 31*  CREATININE 0.67 0.56 1.00 0.64  CALCIUM 9.3 8.6* 8.6* 8.9  MG 2.4  --   --   --    Recent Labs    01/11/22 1333  AST 21  ALT 12  ALKPHOS 37*  BILITOT 0.9  PROT 7.5  ALBUMIN 4.0   Recent Labs    05/12/22 0930  05/14/22 0613 05/16/22 0638 05/17/22 0620 05/18/22 0631  WBC 6.6   < > 10.4 8.7 9.2  NEUTROABS 4.1  --   --   --   --   HGB 11.7*   < > 10.1* 9.6* 10.1*  HCT 37.7   < > 31.4* 30.1* 31.6*  MCV 89.3   < > 88.7 88.0 87.1  PLT 225   < > 235 230 268   < > = values in this interval not displayed.   Lab Results  Component Value Date   TSH 1.424 02/21/2018   Lab Results  Component Value  Date   HGBA1C 5.2 07/19/2018   Lab Results  Component Value Date   CHOL  04/15/2008    193        ATP III CLASSIFICATION:  <200     mg/dL   Desirable  200-239  mg/dL   Borderline High  >=240    mg/dL   High   HDL 48 04/15/2008   LDLCALC (H) 04/15/2008    123        Total Cholesterol/HDL:CHD Risk Coronary Heart Disease Risk Table                     Men   Women  1/2 Average Risk   3.4   3.3   TRIG 111 04/15/2008   CHOLHDL 4.0 04/15/2008    Significant Diagnostic Results in last 30 days:  No results found.  Assessment/Plan 1. Rhinorrhea Patient with significant rhinorrhea of bilateral nostrils leaving wet spot on her clothing. Plan to start flonase daily for 7 days and zyrtec 5 mg for 3 days. Continue to monitor.   2. Essential hypertension BP well-controlled without medications at this time.   3. New onset a-fib (HCC) HR well-controlled on lopressor 25 mg daily.   4. Pulmonary emphysema, unspecified emphysema type ([place]) Lungs clear, no signs of respiratory distress. No oxygen requirement.   5. Dementia without behavioral disturbance (HCC) Significant memory decline. Continued weightloss. Now on hospice with dementia s primary diagnosis. Continue supportive care.   6. Closed fracture of distal end of left femur with routine healing, unspecified fracture morphology, subsequent encounter Fracture of distal femur on 05/12/2022 after an unwitnessed fall which contributed to her admission and continued decline. Continue pain control given goals of care.   7. Protein-calorie malnutrition,  severe Significant weight loss since arrival to long term care. Currently on Hospice secondary to dementia. Will continue protein supplementation as tolerated.   8. History of CVA (cerebrovascular accident) Likely contributory to memory changes. CTM   9. Pressure injury of right ankle, stage 2 ([place]) Stable per nursing. No systemic signs of infection, CTM    Family/ staff Communication: Nursing  Labs/tests ordered:  none.

## 2022-08-23 ENCOUNTER — Other Ambulatory Visit: Payer: Self-pay | Admitting: Student

## 2022-08-23 ENCOUNTER — Non-Acute Institutional Stay (SKILLED_NURSING_FACILITY): Admitting: Student

## 2022-08-23 ENCOUNTER — Encounter: Payer: Self-pay | Admitting: Student

## 2022-08-23 DIAGNOSIS — Z515 Encounter for palliative care: Secondary | ICD-10-CM

## 2022-08-23 DIAGNOSIS — J441 Chronic obstructive pulmonary disease with (acute) exacerbation: Secondary | ICD-10-CM | POA: Diagnosis not present

## 2022-08-23 MED ORDER — MORPHINE SULFATE 20 MG/5ML PO SOLN: 2.5000 mg | ORAL | 0 refills | Status: DC | PRN

## 2022-08-23 MED ORDER — LORAZEPAM 0.5 MG PO TABS: 0.2500 mg | ORAL_TABLET | Freq: Three times a day (TID) | ORAL | 0 refills | Status: DC | PRN

## 2022-08-23 NOTE — Progress Notes (Signed)
Location:  Other [place].  Nursing Home Room Number: [place] [address] Place of Service:  SNF [id number]) Provider:  Dr. Sherri Rad, MD  Patient Care Team: Earnestine Mealing, MD as PCP - General [facility])  Extended Emergency Contact Information Primary Emergency Contact: Sitar,Dr. Theadora [place] Home Phone: [phone] Mobile Phone: [phone] Relation: Son Secondary Emergency Contact: Thereasa Distance Address: [address]          [place], [place] [address] Darden Amber of [place] Mobile Phone: [phone] Relation: Relative  Code Status:  DNR Goals of care: Advanced Directive information    08/23/2022    1:29 PM  Advanced Directives  Does Patient Have a Medical Advance Directive? Yes  Type of Advance Directive Out of facility DNR (pink MOST or yellow form)  Does patient want to make changes to medical advance directive? No - Patient declined     Chief Complaint  Patient presents with   Acute Visit    Wheezing    HPI:  Pt is a 88 y.o. female seen today for an acute visit for wheezing and increased work of breathing. Patient with history of dementia and COPD. She states she feels fine. Answers to her name.   Discussed patient's status with her son. He expresses concern for her current status. He also states he would like a swallow evaluation which is pending approval by Hospice given patient is on hospice at this time. He states he is willing to take her off of hospice to evaluate her swallowing. He does not agree with the use of ativan and morphine, discussed concern that patient's breathing could be managed with these medications to maintain her comfort. Medications were initiated by hospice NP.    Past Medical History:  Diagnosis Date   Asthma    Closed displaced fracture of distal epiphysis of left femur with routine healing 05/13/2022   COPD (chronic obstructive pulmonary disease) (HCC)    COPD with acute exacerbation (HCC)  03/14/2018   Drop in hemoglobin 05/14/2022   Herpes zoster    Hypertension    Neuralgia    Past Surgical History:  Procedure Laterality Date   LEG SURGERY      Allergies  Allergen Reactions   Aspirin Shortness Of Breath   Nsaids Shortness Of Breath and Anaphylaxis    Throat swelling, shortness of breath   Sulfa Antibiotics Other (See Comments)    Stomach pain Stomach pain    Ceftriaxone Itching    Itching and redness near infusion site.    Codeine Other (See Comments)    Short of breath    Ciprofloxacin Hives   Penicillins     Has patient had a PCN reaction causing immediate rash, facial/tongue/throat swelling, SOB or lightheadedness with hypotension: Unknown Has patient had a PCN reaction causing severe rash involving mucus membranes or skin necrosis: Unknown Has patient had a PCN reaction that required hospitalization: Unknown Has patient had a PCN reaction occurring within the last 10 years: Unknown If all of the above answers are "NO", then may proceed with Cephalosporin use.  Other reaction(s): Angioedema (ALLERGY/intolerance) Hives, swelling   Phenol     Other reaction(s): Angioedema (ALLERGY/intolerance), Other (See Comments) Spasms, nerve symptoms Spasms, nerve symptoms  Other reaction(s): Angioedema (ALLERGY/intolerance) Spasms, nerve symptoms   Promethazine Hcl     Outpatient Encounter Medications as of 08/23/2022  Medication Sig   acetaminophen (TYLENOL) 650 MG CR tablet Take 650 mg by mouth every 8 (eight) hours as needed for  pain.   clopidogrel (PLAVIX) 75 MG tablet Take 75 mg by mouth daily.   cyanocobalamin (,VITAMIN B-12,) 1000 MCG/ML injection Inject 1,000 mcg into the skin every 30 (thirty) days.    fluticasone-salmeterol (ADVAIR) 500-50 MCG/ACT AEPB Inhale 1 puff into the lungs in the morning and at bedtime.   guaiFENesin (MUCINEX) 600 MG 12 hr tablet Take 1 tablet (600 mg total) by mouth 2 (two) times daily.   Infant Care Products [facility])  OINT Apply 1 application  topically as needed. Apply to peri-area/sacrum   ipratropium-albuterol (DUONEB) 0.5-2.5 (3) MG/3ML SOLN Inhale 1 vial every 4 hours for wheezing for 3 days then Inhale one vial three times daily   LORazepam (ATIVAN) 0.5 MG tablet Take 0.5 mg by mouth every 4 (four) hours as needed for anxiety.   melatonin 3 MG TABS tablet Take 3 mg by mouth at bedtime.   metoprolol tartrate (LOPRESSOR) 25 MG tablet Take 1 tablet (25 mg total) by mouth 2 (two) times daily.   morphine 20 MG/5ML solution Take 0.6 mLs (2.4 mg total) by mouth every 4 (four) hours as needed for pain (air hunger, shortness of breath.).   polyethylene glycol (MIRALAX / GLYCOLAX) packet Take 17 g by mouth daily as needed.   predniSONE (DELTASONE) 10 MG tablet Give one tablet by mouth once daily for 3 days. Give Two tablets by mouth one time daily for 3 days.   tiotropium (SPIRIVA) 18 MCG inhalation capsule Place 18 mcg into inhaler and inhale daily.   traMADol (ULTRAM) 50 MG tablet Take 25 mg by mouth daily as needed. 30 minutes prior to therapy   [DISCONTINUED] LORazepam (ATIVAN) 0.5 MG tablet Take 0.5 tablets (0.25 mg total) by mouth every 8 (eight) hours as needed for anxiety (shortness of breath, agitation).   [DISCONTINUED] ipratropium-albuterol (DUONEB) 0.5-2.5 (3) MG/3ML SOLN Inhale 3 mLs into the lungs every 6 (six) hours as needed.   No facility-administered encounter medications on file as of 08/23/2022.    Review of Systems  Immunization History  Administered Date(s) Administered   Influenza Inj Mdck Quad Pf 06/26/2016, 06/02/2019, 06/06/2021   Influenza Split 06/07/2014   Influenza Whole 06/07/2008   Influenza, High Dose Seasonal PF 07/19/2018   Influenza-Unspecified 06/25/2022   PNEUMOCOCCAL CONJUGATE-20 12/12/2021   Pneumococcal Polysaccharide-23 10/29/2012   Pertinent  Health Maintenance Due  Topic Date Due   DEXA SCAN  Never done   INFLUENZA VACCINE  Completed      05/16/2022   10:00  AM 05/16/2022    9:59 PM 05/17/2022    9:00 AM 05/17/2022    8:10 PM 05/18/2022   10:00 AM  Fall Risk  Patient Fall Risk Level High fall risk High fall risk High fall risk High fall risk High fall risk   Functional Status Survey:    Vitals:   08/23/22 1238  BP: 120/78  Pulse: 62  Resp: 14  Temp: 98.3 F (36.8 C)  SpO2: 94%  Weight: 106 lb 12.8 oz (48.4 kg)  Height: 5\' 4"  (1.626 m)   Body mass index is 18.33 kg/m. Physical Exam Cardiovascular:     Rate and Rhythm: Normal rate.     Pulses: Normal pulses.  Pulmonary:     Effort: Respiratory distress present.     Breath sounds: Wheezing present.  Skin:    General: Skin is warm and dry.  Neurological:     Mental Status: She is alert. Mental status is at baseline. She is disoriented.     Labs reviewed:  Recent Labs    01/11/22 1333 05/12/22 0930 05/14/22 0613 05/16/22 0638  NA 136 140 137 139  K 4.5 4.0 4.0 4.0  CL 100 108 101 103  CO2 26 28 27 28   GLUCOSE 117* 99 92 99  BUN 23 11 25* 31*  CREATININE 0.67 0.56 1.00 0.64  CALCIUM 9.3 8.6* 8.6* 8.9  MG 2.4  --   --   --    Recent Labs    01/11/22 1333  AST 21  ALT 12  ALKPHOS 37*  BILITOT 0.9  PROT 7.5  ALBUMIN 4.0   Recent Labs    05/12/22 0930 05/14/22 0613 05/16/22 0638 05/17/22 0620 05/18/22 0631  WBC 6.6   < > 10.4 8.7 9.2  NEUTROABS 4.1  --   --   --   --   HGB 11.7*   < > 10.1* 9.6* 10.1*  HCT 37.7   < > 31.4* 30.1* 31.6*  MCV 89.3   < > 88.7 88.0 87.1  PLT 225   < > 235 230 268   < > = values in this interval not displayed.   Lab Results  Component Value Date   TSH 1.424 02/21/2018   Lab Results  Component Value Date   HGBA1C 5.2 07/19/2018   Lab Results  Component Value Date   CHOL  04/15/2008    193        ATP III CLASSIFICATION:  <200     mg/dL   Desirable  811-914  mg/dL   Borderline High  >=782    mg/dL   High   HDL 48 95/62/1308   LDLCALC (H) 04/15/2008    123        Total Cholesterol/HDL:CHD Risk Coronary Heart Disease  Risk Table                     Men   Women  1/2 Average Risk   3.4   3.3   TRIG 111 04/15/2008   CHOLHDL 4.0 04/15/2008    Significant Diagnostic Results in last 30 days:  No results found.  Assessment/Plan 1. COPD with acute exacerbation (HCC) 2. Hospice care Patient in respiratory distress and does not appear comfortable as aligned with goals. Plan to start prednisone 20 mg, will taper over the next 10 days. Plan to schedule duonebs q4 hours for the next 24 hours then q4 hours PRN. Restart spiriva and advair per son's request to prevent these in the future. CXR and CBC ordered to evaluate for pneumonia - if positive will start azithromycin. Patient has gained 3 lbs in the last month despite hospice status. This is likely due to socialization and eating with peers. Will continue hospice at this time, will discontinue in the future if patient's son would like to stop these services. Will continue goals of care discussion.     Family/ staff Communication: son, nursing  Labs/tests ordered:  CBC, CXR

## 2022-08-23 NOTE — Progress Notes (Signed)
Start comfort meds for shortness of breath.

## 2022-08-24 ENCOUNTER — Emergency Department

## 2022-08-24 ENCOUNTER — Other Ambulatory Visit: Payer: Self-pay

## 2022-08-24 ENCOUNTER — Emergency Department
Admission: EM | Admit: 2022-08-24 | Discharge: 2022-08-24 | Disposition: A | Discharge status: Skilled Nursing Facility | Attending: Emergency Medicine | Admitting: Emergency Medicine

## 2022-08-24 DIAGNOSIS — I10 Essential (primary) hypertension: Secondary | ICD-10-CM | POA: Diagnosis not present

## 2022-08-24 DIAGNOSIS — F0390 Unspecified dementia without behavioral disturbance: Secondary | ICD-10-CM | POA: Insufficient documentation

## 2022-08-24 DIAGNOSIS — J45909 Unspecified asthma, uncomplicated: Secondary | ICD-10-CM | POA: Insufficient documentation

## 2022-08-24 DIAGNOSIS — J441 Chronic obstructive pulmonary disease with (acute) exacerbation: Secondary | ICD-10-CM | POA: Insufficient documentation

## 2022-08-24 DIAGNOSIS — R062 Wheezing: Secondary | ICD-10-CM | POA: Diagnosis present

## 2022-08-24 LAB — COMPREHENSIVE METABOLIC PANEL
ALT: 12 U/L (ref 0–44)
AST: 22 U/L (ref 15–41)
Albumin: 3.7 g/dL (ref 3.5–5.0)
Alkaline Phosphatase: 45 U/L (ref 38–126)
Anion gap: 9 (ref 5–15)
BUN: 17 mg/dL (ref 8–23)
CO2: 28 mmol/L (ref 22–32)
Calcium: 9.1 mg/dL (ref 8.9–10.3)
Chloride: 103 mmol/L (ref 98–111)
Creatinine, Ser: 0.52 mg/dL (ref 0.44–1.00)
GFR, Estimated: >60 mL/min (ref >60)
Glucose, Bld: 134 mg/dL — ABNORMAL HIGH (ref 70–99)
Potassium: 4.5 mmol/L (ref 3.5–5.1)
Sodium: 140 mmol/L (ref 135–145)
Total Bilirubin: 0.9 mg/dL (ref 0.3–1.2)
Total Protein: 7.1 g/dL (ref 6.5–8.1)

## 2022-08-24 LAB — CBC WITH DIFFERENTIAL/PLATELET
Abs Immature Granulocytes: 0.03 10*3/uL (ref 0.00–0.07)
Basophils Absolute: 0.1 10*3/uL (ref 0.0–0.1)
Basophils Relative: 1 %
Eosinophils Absolute: 0.1 10*3/uL (ref 0.0–0.5)
Eosinophils Relative: 1 %
HCT: 41.3 % (ref 36.0–46.0)
Hemoglobin: 12.6 g/dL (ref 12.0–15.0)
Immature Granulocytes: 0 %
Lymphocytes Relative: 7 %
Lymphs Abs: 0.6 10*3/uL — ABNORMAL LOW (ref 0.7–4.0)
MCH: 28.8 pg (ref 26.0–34.0)
MCHC: 30.5 g/dL (ref 30.0–36.0)
MCV: 94.3 fL (ref 80.0–100.0)
Monocytes Absolute: 0.1 10*3/uL (ref 0.1–1.0)
Monocytes Relative: 1 %
Neutro Abs: 7.8 10*3/uL — ABNORMAL HIGH (ref 1.7–7.7)
Neutrophils Relative %: 90 %
Platelets: 217 10*3/uL (ref 150–400)
RBC: 4.38 MIL/uL (ref 3.87–5.11)
RDW: 14 % (ref 11.5–15.5)
WBC: 8.7 10*3/uL (ref 4.0–10.5)
nRBC: 0 % (ref 0.0–0.2)

## 2022-08-24 LAB — D-DIMER, QUANTITATIVE: D-Dimer, Quant: 2.49 ug/mL-FEU — ABNORMAL HIGH (ref 0.00–0.50)

## 2022-08-24 LAB — TROPONIN I (HIGH SENSITIVITY): Troponin I (High Sensitivity): 37 ng/L — ABNORMAL HIGH (ref <18)

## 2022-08-24 MED ORDER — DEXAMETHASONE SODIUM PHOSPHATE 10 MG/ML IJ SOLN
10.0000 mg | Freq: Once | INTRAMUSCULAR | Status: AC
  Administered 2022-08-24: 10 mg via INTRAVENOUS
  Filled 2022-08-24: qty 1

## 2022-08-24 MED ORDER — IPRATROPIUM-ALBUTEROL 0.5-2.5 (3) MG/3ML IN SOLN
3.0000 mL | Freq: Once | RESPIRATORY_TRACT | Status: AC
  Administered 2022-08-24: 3 mL via RESPIRATORY_TRACT
  Filled 2022-08-24: qty 3

## 2022-08-24 NOTE — Progress Notes (Signed)
[facility] ED [facility])       This patient is a current hospice patient with [facility], admitted 10.20.23 with a terminal diagnosis of abnormal weight loss.     [facility] will continue to follow for any discharge planning needs and to coordinate continuation of hospice care.   Please don't hesitate to call with any Hospice related questions or concerns.    Thank you for the opportunity to participate in this patient's care. Jhonnie Garner, Therapist, sports, BSN, [facility] Liaison [phone]

## 2022-08-24 NOTE — ED Provider Notes (Addendum)
[facility] Provider Note    Event Date/Time   First MD Initiated Contact with Patient 08/24/22 1146     (approximate)   History   Wheezing (Pneumothorax on left by xray)   HPI  Janice Hoffman is a 88 y.o. female with past medical history of COPD who presents because of increased work of breathing.  Patient is accompanied by her son and grandson.  Apparently patient had a chest x-ray done at [facility] and family was told she had a pneumothorax and so she is here in the ER.  However I see the read of the x-ray and it is read as elevation of the left hemidiaphragm and some compressive atelectasis but there is no mention of pneumothorax.  She has had shortness of breath over the last week with audible wheezing over the last 4 days.  She coughs frequently they think she may have aspirated.  Of note patient is comfort care DNR/DNI they would not want any invasive measures such as chest tube and would like her to be comfortable.  They would like her to go back to [facility]     Past Medical History:  Diagnosis Date   Asthma    Closed displaced fracture of distal epiphysis of left femur with routine healing 05/13/2022   COPD (chronic obstructive pulmonary disease) (HCC)    COPD with acute exacerbation ([place]) 03/14/2018   Drop in hemoglobin 05/14/2022   Herpes zoster    Hypertension    Neuralgia     Patient Active Problem List   Diagnosis Date Noted   Pressure injury of right ankle, stage 2 ([place]) 06/19/2022   Cellulitis of right lower extremity 06/19/2022   History of delirium 05/20/2022   Closed fracture of left distal femur ([place]) 05/14/2022   Acute respiratory failure with hypoxia ([place]) 05/14/2022   History of CVA (cerebrovascular accident) 05/12/2022   Constipation 07/18/2019   Aspiration pneumonia ([place]) 07/18/2019   Dementia without behavioral disturbance ([place])    Palliative care by specialist    Goals of care, counseling/discussion     Protein-calorie malnutrition, severe 09/16/2018   COPD (chronic obstructive pulmonary disease) ([place]) 03/14/2018   New onset a-fib ([place]) 06/15/2017   Near syncope 05/31/2017   Essential hypertension 01/05/2017   Dysphagia 01/05/2017   Osteoarthritis 10/12/2014   Osteoporosis, post-menopausal 10/12/2014   Pernicious anemia 06/07/2014   Vitamin D deficiency 06/07/2014   Pain in joint, lower leg 08/03/2013   POSTHERPETIC NEURALGIA 05/12/2008   B12 DEFICIENCY 04/26/2008   CONSTIPATION 04/26/2008     Physical Exam  Triage Vital Signs: ED Triage Vitals  Enc Vitals Group     BP 08/24/22 1127 111/60     Pulse Rate 08/24/22 1127 73     Resp 08/24/22 1127 18     Temp 08/24/22 1130 97.6 F (36.4 C)     Temp Source 08/24/22 1130 Oral     SpO2 08/24/22 1126 94 %     Weight --      Height --      Head Circumference --      Peak Flow --      Pain Score --      Pain Loc --      Pain Edu? --      Excl. in [place]? --     Most recent vital signs: Vitals:   08/24/22 1127 08/24/22 1130  BP: 111/60   Pulse: 73   Resp: 18   Temp:  97.6  F (36.4 C)  SpO2: 94%      General: Awake, patient is pale and ill-appearing CV:  Good peripheral perfusion. No edema Resp:  Audible wheezing with tachypnea and prolonged expiratory phase Abd:  No distention.  Neuro:             Awake, patient tracks but does not answer yes or no questions eyes are open Other:     ED Results / Procedures / Treatments  Labs (all labs ordered are listed, but only abnormal results are displayed) Labs Reviewed  CBC WITH DIFFERENTIAL/PLATELET - Abnormal; Notable for the following components:      Result Value   Neutro Abs 7.8 (*)    Lymphs Abs 0.6 (*)    All other components within normal limits  COMPREHENSIVE METABOLIC PANEL - Abnormal; Notable for the following components:   Glucose, Bld 134 (*)    All other components within normal limits  D-DIMER, QUANTITATIVE - Abnormal; Notable for the following components:    D-Dimer, Quant 2.49 (*)    All other components within normal limits  TROPONIN I (HIGH SENSITIVITY) - Abnormal; Notable for the following components:   Troponin I (High Sensitivity) 37 (*)    All other components within normal limits     EKG  Significant baseline artifact   RADIOLOGY  I reviewed and interpreted the CXR which does not show any acute cardiopulmonary process   PROCEDURES:  Critical Care performed: No  Procedures    MEDICATIONS ORDERED IN ED: Medications  ipratropium-albuterol (DUONEB) 0.5-2.5 (3) MG/3ML nebulizer solution 3 mL (has no administration in time range)  ipratropium-albuterol (DUONEB) 0.5-2.5 (3) MG/3ML nebulizer solution 3 mL (has no administration in time range)  ipratropium-albuterol (DUONEB) 0.5-2.5 (3) MG/3ML nebulizer solution 3 mL (has no administration in time range)     IMPRESSION / MDM / ASSESSMENT AND PLAN / ED COURSE  I reviewed the triage vital signs and the nursing notes.                              Patient's presentation is most consistent with acute presentation with potential threat to life or bodily function.  Differential diagnosis includes, but is not limited to, COPD exacerbation, aspiration, pneumonia, pneumothorax, pleural effusion  The patient is a 88 year old female presenting with respiratory distress.  Of note patient is on hospice and this is confirmed by her son and grandson who are at bedside and notes that she would only want comfort measures.  Apparently she was sent over because of a pneumothorax seen on chest x-ray however I see the chest x-ray report which is at bedside and there is no mention of pneumothorax just elevation of left hemidiaphragm with some compressive atelectasis.  Patient looks unwell she has audible wheezing with prolonged expiratory phase does not talk but apparently this is her baseline.  Has been more short of breath for a week with audible wheezing for 4 days.  Treatment options with  patient's family and patient's presents with a she is not really able to take part in the conversation.  They agree that they would not want anything invasive they would prefer comfort measures including bronchodilators and antibiotics if necessary.  They would prefer that she not be admitted and return to [facility] which I think is reasonable.  Will give DuoNebs and IV dose of dexamethasone..  D-dimer was sent from triage is elevated patient would not be anticoagulated if she was found  to have a PE so will not get any additional imaging.  Family thinks patient looks better after some labs.  They would like her to go back to [facility] where she is most comfortable and they can do nebulizers and give her oxygen.  I think this is the best course of action as well.       FINAL CLINICAL IMPRESSION(S) / ED DIAGNOSES   Final diagnoses:  COPD exacerbation ([place])     Rx / DC Orders   ED Discharge Orders     None        Note:  This document was prepared using Dragon voice recognition software and may include unintentional dictation errors.   Rada Hay, MD 08/24/22 1233    Rada Hay, MD 08/24/22 [phone]

## 2022-08-24 NOTE — Discharge Instructions (Signed)
The patient did not have a pneumothorax.  She was given nebulizer treatments and steroids for comfort.

## 2022-08-24 NOTE — ED Notes (Signed)
EMS to bedside to pick patient up.

## 2022-08-24 NOTE — ED Notes (Signed)
First Nurse Note: Pt arrives via EMS from [facility]- pt has L side pneumothorax per radiology report, 100% RA, 142/103, HR 80s- pt has audible wheezing- 1 duoneb given

## 2022-08-24 NOTE — ED Triage Notes (Signed)
Per EMS pt has xray showing left pneumothorax- duoneb and abuterol given en route. Audible wheeze noted in all four lung fields, decreased lung sound on the left. Pt is alert, oriented to self only. Substernal retractions noted. Respirations even.

## 2022-08-26 ENCOUNTER — Encounter: Payer: Self-pay | Admitting: Student

## 2022-08-26 ENCOUNTER — Non-Acute Institutional Stay (SKILLED_NURSING_FACILITY): Admitting: Student

## 2022-08-26 DIAGNOSIS — J439 Emphysema, unspecified: Secondary | ICD-10-CM

## 2022-08-26 LAB — COMPREHENSIVE METABOLIC PANEL
Calcium: 8.7 (ref 8.7–10.7)
eGFR: 85

## 2022-08-26 LAB — BASIC METABOLIC PANEL
BUN: 15 (ref 4–21)
CO2: 29 — AB (ref 13–22)
Chloride: 104 (ref 99–108)
Creatinine: 0.6 (ref 0.5–1.1)
Glucose: 68
Potassium: 4.1 mEq/L (ref 3.5–5.1)
Sodium: 140 (ref 137–147)

## 2022-08-26 LAB — CBC AND DIFFERENTIAL
HCT: 35 — AB (ref 36–46)
Hemoglobin: 11.6 — AB (ref 12.0–16.0)
Neutrophils Absolute: 3730
Platelets: 168 10*3/uL (ref 150–400)
WBC: 5.6

## 2022-08-26 LAB — CBC: RBC: 3.94 (ref 3.87–5.11)

## 2022-08-26 NOTE — Progress Notes (Signed)
Location:  Other [facility] Room Number: [place] of Service:  SNF [phone]) Provider:  Dr. Orland Jarred, MD  Patient Care Team: Baxter Hire, MD as PCP - General (Internal Medicine)  Extended Emergency Contact Information Primary Emergency Contact: [place]  [place] Phone: [phone] Mobile Phone: [phone] Relation: Son Secondary Emergency Contact: [name] Address: [address]          [place], [place] [address] [name] Mobile Phone: [phone] Relation: Relative  Code Status:  DNR Goals of care: Advanced Directive information    08/26/2022    1:45 PM  Advanced Directives  Does Patient Have a Medical Advance Directive? Yes  Type of Advance Directive Out of facility DNR (pink MOST or yellow form)  Does patient want to make changes to medical advance directive? No - Patient declined     Chief Complaint  Patient presents with   Follow-up    ER Follow up    HPI:  Pt is a 88 y.o. female seen today for an acute visit for follow up after ED Visit for COPD exacerbation. She had a CXR on Friday which showed potential left hemidiaphragm paralysis - this is the first image showing this issue. Patient was sent to the emergency department per request of son/HCPOA.   Today patient is pleasant, states she feels fine.   Discussed briefly with son concern that patient's dementia will not permit use of inhalers, however, he has had success and would like to continue trying.    Past Medical History:  Diagnosis Date   Asthma    Closed displaced fracture of distal epiphysis of left femur with routine healing 05/13/2022   COPD (chronic obstructive pulmonary disease) (HCC)    COPD with acute exacerbation ([place]) 03/14/2018   Drop in hemoglobin 05/14/2022   Herpes zoster    Hypertension    Neuralgia    Past Surgical History:  Procedure Laterality Date   LEG SURGERY      Allergies   Allergen Reactions   Aspirin Shortness Of Breath   Nsaids Shortness Of Breath and Anaphylaxis    Throat swelling, shortness of breath   Sulfa Antibiotics Other (See Comments)    Stomach pain Stomach pain    Ceftriaxone Itching    Itching and redness near infusion site.    Codeine Other (See Comments)    Short of breath    Ciprofloxacin Hives   Penicillins     Has patient had a PCN reaction causing immediate rash, facial/tongue/throat swelling, SOB or lightheadedness with hypotension: Unknown Has patient had a PCN reaction causing severe rash involving mucus membranes or skin necrosis: Unknown Has patient had a PCN reaction that required hospitalization: Unknown Has patient had a PCN reaction occurring within the last 10 years: Unknown If all of the above answers are "NO", then may proceed with Cephalosporin use.  Other reaction(s): Angioedema (ALLERGY/intolerance) Hives, swelling   Phenol     Other reaction(s): Angioedema (ALLERGY/intolerance), Other (See Comments) Spasms, nerve symptoms Spasms, nerve symptoms  Other reaction(s): Angioedema (ALLERGY/intolerance) Spasms, nerve symptoms   Promethazine Hcl     Outpatient Encounter Medications as of 08/26/2022  Medication Sig   acetaminophen (TYLENOL) 650 MG CR tablet Take 650 mg by mouth every 8 (eight) hours as needed for pain.   clopidogrel (PLAVIX) 75 MG tablet Take 75 mg by mouth daily.   cyanocobalamin (,VITAMIN B-12,) 1000 MCG/ML injection Inject 1,000 mcg into the skin  every 30 (thirty) days.    fluticasone-salmeterol (ADVAIR) 500-50 MCG/ACT AEPB Inhale 1 puff into the lungs in the morning and at bedtime.   guaiFENesin (MUCINEX) 600 MG 12 hr tablet Take 1 tablet (600 mg total) by mouth 2 (two) times daily.   Infant Care Products [facility]) OINT Apply 1 application  topically as needed. Apply to peri-area/sacrum   ipratropium-albuterol (DUONEB) 0.5-2.5 (3) MG/3ML SOLN Inhale 1 vial every 4 hours for wheezing for 3 days  then Inhale one vial three times daily   LORazepam (ATIVAN) 0.5 MG tablet Take 0.5 mg by mouth every 4 (four) hours as needed for anxiety.   melatonin 3 MG TABS tablet Take 3 mg by mouth at bedtime.   metoprolol tartrate (LOPRESSOR) 25 MG tablet Take 1 tablet (25 mg total) by mouth 2 (two) times daily.   morphine 20 MG/5ML solution Take 0.6 mLs (2.4 mg total) by mouth every 4 (four) hours as needed for pain (air hunger, shortness of breath.). (Patient taking differently: Take 0.25 mLs by mouth every 4 (four) hours as needed for pain (air hunger, shortness of breath.).)   polyethylene glycol (MIRALAX / GLYCOLAX) packet Take 17 g by mouth daily as needed.   predniSONE (DELTASONE) 10 MG tablet Give one tablet by mouth once daily for 3 days. Give Two tablets by mouth one time daily for 3 days.   tiotropium (SPIRIVA) 18 MCG inhalation capsule Place 18 mcg into inhaler and inhale daily.   traMADol (ULTRAM) 50 MG tablet Take 25 mg by mouth daily as needed. 30 minutes prior to therapy   No facility-administered encounter medications on file as of 08/26/2022.    Review of Systems  Unable to perform ROS: Dementia  All other systems reviewed and are negative.   Immunization History  Administered Date(s) Administered   Influenza Inj Mdck Quad Pf 06/26/2016, 06/02/2019, 06/06/2021   Influenza Split 06/07/2014   Influenza Whole 06/07/2008   Influenza, High Dose Seasonal PF 07/19/2018   Influenza-Unspecified 06/25/2022   PNEUMOCOCCAL CONJUGATE-20 12/12/2021   Pneumococcal Polysaccharide-23 10/29/2012   Pertinent  Health Maintenance Due  Topic Date Due   DEXA SCAN  Never done   INFLUENZA VACCINE  Completed      05/16/2022    9:59 PM 05/17/2022    9:00 AM 05/17/2022    8:10 PM 05/18/2022   10:00 AM 08/24/2022   11:29 AM  Fall Risk  Patient Fall Risk Level High fall risk High fall risk High fall risk High fall risk High fall risk   Functional Status Survey:    Vitals:   08/26/22 1338  BP: (!)  162/82  Pulse: 78  Resp: (!) 22  Temp: 97.8 F (36.6 C)  SpO2: 94%  Weight: 106 lb 12.8 oz (48.4 kg)  Height: '5\' 4"'$  (1.626 m)   Body mass index is 18.33 kg/m. Physical Exam Pulmonary:     Comments: Bilateral expiratory wheezes Neurological:     Mental Status: She is alert.     Labs reviewed: Recent Labs    01/11/22 1333 05/12/22 0930 05/14/22 0613 05/16/22 0638 08/24/22 1139  NA 136   < > 137 139 140  K 4.5   < > 4.0 4.0 4.5  CL 100   < > 101 103 103  CO2 26   < > '27 28 28  '$ GLUCOSE 117*   < > 92 99 134*  BUN 23   < > 25* 31* 17  CREATININE 0.67   < > 1.00 0.64 0.52  CALCIUM 9.3   < > 8.6* 8.9 9.1  MG 2.4  --   --   --   --    < > = values in this interval not displayed.   Recent Labs    01/11/22 1333 08/24/22 1139  AST 21 22  ALT 12 12  ALKPHOS 37* 45  BILITOT 0.9 0.9  PROT 7.5 7.1  ALBUMIN 4.0 3.7   Recent Labs    05/12/22 0930 05/14/22 0613 05/17/22 0620 05/18/22 0631 08/24/22 1139  WBC 6.6   < > 8.7 9.2 8.7  NEUTROABS 4.1  --   --   --  7.8*  HGB 11.7*   < > 9.6* 10.1* 12.6  HCT 37.7   < > 30.1* 31.6* 41.3  MCV 89.3   < > 88.0 87.1 94.3  PLT 225   < > 230 268 217   < > = values in this interval not displayed.   Lab Results  Component Value Date   TSH 1.424 02/21/2018   Lab Results  Component Value Date   HGBA1C 5.2 07/19/2018   Lab Results  Component Value Date   CHOL  04/15/2008    193        ATP III CLASSIFICATION:  <200     mg/dL   Desirable  200-239  mg/dL   Borderline High  >=240    mg/dL   High   HDL 48 04/15/2008   LDLCALC (H) 04/15/2008    123        Total Cholesterol/HDL:CHD Risk Coronary Heart Disease Risk Table                     Men   Women  1/2 Average Risk   3.4   3.3   TRIG 111 04/15/2008   CHOLHDL 4.0 04/15/2008    Significant Diagnostic Results in last 30 days:  DG Chest Port 1 View  Result Date: 08/24/2022 CLINICAL DATA:  Shortness of breath. EXAM: PORTABLE CHEST 1 VIEW COMPARISON:  Chest x-ray  May 13, 2022 FINDINGS: Air-filled stomach is identified under the elevated left hemidiaphragm, stable. The heart, hila, mediastinum are normal. No pneumothorax. Resolution of the right basilar opacity. No other acute abnormalities. No overt edema. IMPRESSION: Resolution of the right basilar opacity. No other acute abnormalities. Electronically Signed   By: Dorise Bullion III M.D.   On: 08/24/2022 12:21    Assessment/Plan 1. Pulmonary emphysema, unspecified emphysema type ([place]) Patient's respiratory status has improved. Normal saturation without supplemental oxygen. Unclear cause of paralysis of hemidiaphragm. CXR negative for consolidation. Plan to continue predisone taper, Spiriva, Advair, and q4hr PRN duonebs. Continue Hospice at this time. Awaiting approval for swallow evaluation.    Family/ staff Communication: Son, nursing  Labs/tests ordered:  none

## 2022-09-24 ENCOUNTER — Non-Acute Institutional Stay (SKILLED_NURSING_FACILITY): Payer: PPO | Admitting: Nurse Practitioner

## 2022-09-24 ENCOUNTER — Encounter: Payer: Self-pay | Admitting: Nurse Practitioner

## 2022-09-24 DIAGNOSIS — E43 Unspecified severe protein-calorie malnutrition: Secondary | ICD-10-CM

## 2022-09-24 DIAGNOSIS — I10 Essential (primary) hypertension: Secondary | ICD-10-CM | POA: Diagnosis not present

## 2022-09-24 DIAGNOSIS — F0390 Unspecified dementia without behavioral disturbance: Secondary | ICD-10-CM

## 2022-09-24 DIAGNOSIS — J439 Emphysema, unspecified: Secondary | ICD-10-CM | POA: Diagnosis not present

## 2022-09-24 DIAGNOSIS — Z8673 Personal history of transient ischemic attack (TIA), and cerebral infarction without residual deficits: Secondary | ICD-10-CM

## 2022-09-24 NOTE — Progress Notes (Signed)
Location:   [facility] Room Number: [place] of Service:  SNF [phone]) Provider:  Sherrie Mustache, NP  Baxter Hire, MD  Patient Care Team: Baxter Hire, MD as PCP - General (Internal Medicine)  Extended Emergency Contact Information Primary Emergency Contact: [name]  [place] Phone: [phone] Mobile Phone: [phone] Relation: Son Secondary Emergency Contact: [name] Address: [address]          [place], [place] [address] [name] Mobile Phone: [phone] Relation: Relative  Code Status:  DNR Goals of care: Advanced Directive information    09/24/2022   12:57 PM  Advanced Directives  Does Patient Have a Medical Advance Directive? Yes  Type of Advance Directive Out of facility DNR (pink MOST or yellow form)  Does patient want to make changes to medical advance directive? No - Patient declined  Pre-existing out of facility DNR order (yellow form or pink MOST form) Pink MOST form placed in chart (order not valid for inpatient use);Yellow form placed in chart (order not valid for inpatient use)     Chief Complaint  Patient presents with   Medical Management of Chronic Issues    Routine follow up   Immunizations    Tdap and shingrix vaccine due   Quality Metric Gaps    DEXA SCAN due    HPI:  Pt is a 89 y.o. female seen today for medical management of chronic diseases.   Pt with hx of COPD, dementia, htn, constipation, currently under hospice   She was sent to ED due to COPD excerebration in December but has been doing well recently. She eats with the other residents in facility with the help of staff.  Weight has been stable- actually with increase over the last 6 months.  Denies pain.   Past Medical History:  Diagnosis Date   Asthma    Closed displaced fracture of distal epiphysis of left femur with routine healing 05/13/2022   COPD (chronic obstructive pulmonary  disease) (HCC)    COPD with acute exacerbation ([place]) 03/14/2018   Drop in hemoglobin 05/14/2022   Herpes zoster    Hypertension    Neuralgia    Past Surgical History:  Procedure Laterality Date   LEG SURGERY      Allergies  Allergen Reactions   Aspirin Shortness Of Breath   Nsaids Shortness Of Breath and Anaphylaxis    Throat swelling, shortness of breath   Sulfa Antibiotics Other (See Comments)    Stomach pain Stomach pain    Ceftriaxone Itching    Itching and redness near infusion site.    Codeine Other (See Comments)    Short of breath    Ciprofloxacin Hives   Penicillins     Has patient had a PCN reaction causing immediate rash, facial/tongue/throat swelling, SOB or lightheadedness with hypotension: Unknown Has patient had a PCN reaction causing severe rash involving mucus membranes or skin necrosis: Unknown Has patient had a PCN reaction that required hospitalization: Unknown Has patient had a PCN reaction occurring within the last 10 years: Unknown If all of the above answers are "NO", then may proceed with Cephalosporin use.  Other reaction(s): Angioedema (ALLERGY/intolerance) Hives, swelling   Phenol     Other reaction(s): Angioedema (ALLERGY/intolerance), Other (See Comments) Spasms, nerve symptoms Spasms, nerve symptoms  Other reaction(s): Angioedema (ALLERGY/intolerance) Spasms, nerve symptoms   Promethazine Hcl     Allergies as of 09/24/2022       Reactions  Aspirin Shortness Of Breath   Nsaids Shortness Of Breath, Anaphylaxis   Throat swelling, shortness of breath   Sulfa Antibiotics Other (See Comments)   Stomach pain Stomach pain   Ceftriaxone Itching   Itching and redness near infusion site.    Codeine Other (See Comments)   Short of breath   Ciprofloxacin Hives   Penicillins    Has patient had a PCN reaction causing immediate rash, facial/tongue/throat swelling, SOB or lightheadedness with hypotension: Unknown Has patient had a PCN  reaction causing severe rash involving mucus membranes or skin necrosis: Unknown Has patient had a PCN reaction that required hospitalization: Unknown Has patient had a PCN reaction occurring within the last 10 years: Unknown If all of the above answers are "NO", then may proceed with Cephalosporin use. Other reaction(s): Angioedema (ALLERGY/intolerance) Hives, swelling   Phenol    Other reaction(s): Angioedema (ALLERGY/intolerance), Other (See Comments) Spasms, nerve symptoms Spasms, nerve symptoms Other reaction(s): Angioedema (ALLERGY/intolerance) Spasms, nerve symptoms   Promethazine Hcl         Medication List        Accurate as of September 24, 2022  1:01 PM. If you have any questions, ask your nurse or doctor.          STOP taking these medications    predniSONE 10 MG tablet Commonly known as: DELTASONE Stopped by: Lauree Chandler, NP       TAKE these medications    acetaminophen 650 MG CR tablet Commonly known as: TYLENOL Take 650 mg by mouth every 8 (eight) hours as needed for pain.   clopidogrel 75 MG tablet Commonly known as: PLAVIX Take 75 mg by mouth daily.   cyanocobalamin 1000 MCG/ML injection Commonly known as: VITAMIN B12 Inject 1,000 mcg into the skin every 30 (thirty) days.   D[place]acl[place] Oint Apply 1 application  topically as needed. Apply to peri-area/sacrum   fluticasone-salmeterol 500-50 MCG/ACT Aepb Commonly known as: ADVAIR Inhale 1 puff into the lungs in the morning and at bedtime.   guaiFENesin 600 MG 12 hr tablet Commonly known as: MUCINEX Take 1 tablet (600 mg total) by mouth 2 (two) times daily.   ipratropium-albuterol 0.5-2.5 (3) MG/3ML Soln Commonly known as: DUONEB Inhale 1 vial every 4 hours for wheezing for 3 days then Inhale one vial three times daily   LORazepam 0.5 MG tablet Commonly known as: ATIVAN Take 0.5 mg by mouth every 4 (four) hours as needed for anxiety.   melatonin 3 MG Tabs tablet Take 3 mg by mouth  at bedtime.   metoprolol tartrate 25 MG tablet Commonly known as: LOPRESSOR Take 1 tablet (25 mg total) by mouth 2 (two) times daily.   morphine 20 MG/5ML solution Take 0.6 mLs (2.4 mg total) by mouth every 4 (four) hours as needed for pain (air hunger, shortness of breath.). What changed: how much to take   polyethylene glycol 17 g packet Commonly known as: MIRALAX / GLYCOLAX Take 17 g by mouth daily as needed.   tiotropium 18 MCG inhalation capsule Commonly known as: SPIRIVA Place 18 mcg into inhaler and inhale daily.   traMADol 50 MG tablet Commonly known as: ULTRAM Take 25 mg by mouth daily as needed. 30 minutes prior to therapy        Review of Systems  Unable to perform ROS: Dementia    Immunization History  Administered Date(s) Administered   Influenza Inj Mdck Quad Pf 06/26/2016, 06/02/2019, 06/06/2021   Influenza Split 06/07/2014   Influenza Whole 06/07/2008  Influenza, High Dose Seasonal PF 07/19/2018   Influenza-Unspecified 06/25/2022   PNEUMOCOCCAL CONJUGATE-20 12/12/2021   Pneumococcal Polysaccharide-23 10/29/2012   Pertinent  Health Maintenance Due  Topic Date Due   DEXA SCAN  Never done   INFLUENZA VACCINE  Completed      05/16/2022    9:59 PM 05/17/2022    9:00 AM 05/17/2022    8:10 PM 05/18/2022   10:00 AM 08/24/2022   11:29 AM  Fall Risk  (RETIRED) Patient Fall Risk Level High fall risk High fall risk High fall risk High fall risk High fall risk   Functional Status Survey:    Vitals:   09/24/22 1253  BP: (!) 159/81  Pulse: 72  Resp: 16  Temp: (!) 97.5 F (36.4 C)  SpO2: 100%  Weight: 105 lb 6.4 oz (47.8 kg)  Height: '5\' 4"'$  (1.626 m)   Body mass index is 18.09 kg/m. Physical Exam Constitutional:      General: She is not in acute distress.    Appearance: She is well-developed. She is not diaphoretic.  HENT:     Head: Normocephalic and atraumatic.     Mouth/Throat:     Pharynx: No oropharyngeal exudate.  Eyes:      Conjunctiva/sclera: Conjunctivae normal.     Pupils: Pupils are equal, round, and reactive to light.  Cardiovascular:     Rate and Rhythm: Normal rate and regular rhythm.     Heart sounds: Normal heart sounds.  Pulmonary:     Effort: Pulmonary effort is normal.     Breath sounds: Decreased breath sounds present.     Comments: Throughout  Abdominal:     General: Bowel sounds are normal.     Palpations: Abdomen is soft.  Musculoskeletal:     Cervical back: Normal range of motion and neck supple.     Right lower leg: No edema.     Left lower leg: No edema.  Skin:    General: Skin is warm and dry.  Neurological:     Mental Status: She is alert.  Psychiatric:        Mood and Affect: Mood normal.     Labs reviewed: Recent Labs    01/11/22 1333 05/12/22 0930 05/14/22 0613 05/16/22 0638 08/24/22 1139  NA 136   < > 137 139 140  K 4.5   < > 4.0 4.0 4.5  CL 100   < > 101 103 103  CO2 26   < > '27 28 28  '$ GLUCOSE 117*   < > 92 99 134*  BUN 23   < > 25* 31* 17  CREATININE 0.67   < > 1.00 0.64 0.52  CALCIUM 9.3   < > 8.6* 8.9 9.1  MG 2.4  --   --   --   --    < > = values in this interval not displayed.   Recent Labs    01/11/22 1333 08/24/22 1139  AST 21 22  ALT 12 12  ALKPHOS 37* 45  BILITOT 0.9 0.9  PROT 7.5 7.1  ALBUMIN 4.0 3.7   Recent Labs    05/12/22 0930 05/14/22 0613 05/17/22 0620 05/18/22 0631 08/24/22 1139  WBC 6.6   < > 8.7 9.2 8.7  NEUTROABS 4.1  --   --   --  7.8*  HGB 11.7*   < > 9.6* 10.1* 12.6  HCT 37.7   < > 30.1* 31.6* 41.3  MCV 89.3   < > 88.0 87.1 94.3  PLT  225   < > 230 268 217   < > = values in this interval not displayed.   Lab Results  Component Value Date   TSH 1.424 02/21/2018   Lab Results  Component Value Date   HGBA1C 5.2 07/19/2018   Lab Results  Component Value Date   CHOL  04/15/2008    193        ATP III CLASSIFICATION:  <200     mg/dL   Desirable  200-239  mg/dL   Borderline High  >=240    mg/dL   High   HDL 48  04/15/2008   LDLCALC (H) 04/15/2008    123        Total Cholesterol/HDL:CHD Risk Coronary Heart Disease Risk Table                     Men   Women  1/2 Average Risk   3.4   3.3   TRIG 111 04/15/2008   CHOLHDL 4.0 04/15/2008    Significant Diagnostic Results in last 30 days:  No results found.  Assessment/Plan 1. Pulmonary emphysema, unspecified emphysema type ([place]) -stable at this time on current regimen. Continues on Advair and spiriva scheduled, duonebs PRN  2. Essential hypertension -has been slightly elevated on recent readings, however she is under hospice care at this time therefore will continue to monitor and if continues to trend up will re-evaluate. Continues on metoprolol   3. Dementia without behavioral disturbance (HCC) -Stable, no acute changes in cognitive or functional status, continue supportive care with SNF staff  4. Protein-calorie malnutrition, severe -stable at this time, continues with assistance from staff and supplements as needed  5. History of CVA (cerebrovascular accident) -continues on plavix    Janice Hoffman K. Sharon, [facility] [phone]

## 2022-09-26 ENCOUNTER — Other Ambulatory Visit: Payer: Self-pay

## 2022-09-26 ENCOUNTER — Emergency Department
Admission: EM | Admit: 2022-09-26 | Discharge: 2022-09-26 | Disposition: A | Discharge status: Skilled Nursing Facility | Payer: PPO | Attending: Emergency Medicine | Admitting: Emergency Medicine

## 2022-09-26 ENCOUNTER — Emergency Department: Payer: PPO

## 2022-09-26 DIAGNOSIS — J449 Chronic obstructive pulmonary disease, unspecified: Secondary | ICD-10-CM | POA: Diagnosis not present

## 2022-09-26 DIAGNOSIS — R410 Disorientation, unspecified: Secondary | ICD-10-CM | POA: Insufficient documentation

## 2022-09-26 DIAGNOSIS — R7989 Other specified abnormal findings of blood chemistry: Secondary | ICD-10-CM | POA: Diagnosis not present

## 2022-09-26 DIAGNOSIS — I10 Essential (primary) hypertension: Secondary | ICD-10-CM | POA: Insufficient documentation

## 2022-09-26 DIAGNOSIS — Z20822 Contact with and (suspected) exposure to covid-19: Secondary | ICD-10-CM | POA: Diagnosis not present

## 2022-09-26 DIAGNOSIS — R0789 Other chest pain: Secondary | ICD-10-CM | POA: Diagnosis not present

## 2022-09-26 LAB — CBC
HCT: 41.4 % (ref 36.0–46.0)
Hemoglobin: 13.1 g/dL (ref 12.0–15.0)
MCH: 27.9 pg (ref 26.0–34.0)
MCHC: 31.6 g/dL (ref 30.0–36.0)
MCV: 88.1 fL (ref 80.0–100.0)
Platelets: 211 10*3/uL (ref 150–400)
RBC: 4.7 MIL/uL (ref 3.87–5.11)
RDW: 13.6 % (ref 11.5–15.5)
WBC: 5.5 10*3/uL (ref 4.0–10.5)
nRBC: 0 % (ref 0.0–0.2)

## 2022-09-26 LAB — BASIC METABOLIC PANEL
Anion gap: 9 (ref 5–15)
BUN: 13 mg/dL (ref 8–23)
CO2: 26 mmol/L (ref 22–32)
Calcium: 9.1 mg/dL (ref 8.9–10.3)
Chloride: 99 mmol/L (ref 98–111)
Creatinine, Ser: 0.51 mg/dL (ref 0.44–1.00)
GFR, Estimated: >60 mL/min (ref >60)
Glucose, Bld: 83 mg/dL (ref 70–99)
Potassium: 3.8 mmol/L (ref 3.5–5.1)
Sodium: 134 mmol/L — ABNORMAL LOW (ref 135–145)

## 2022-09-26 LAB — RESP PANEL BY RT-PCR (RSV, FLU A&B, COVID)  RVPGX2
Influenza A by PCR: NEGATIVE
Influenza B by PCR: NEGATIVE
Resp Syncytial Virus by PCR: NEGATIVE
SARS Coronavirus 2 by RT PCR: NEGATIVE

## 2022-09-26 LAB — URINALYSIS, ROUTINE W REFLEX MICROSCOPIC
Bilirubin Urine: NEGATIVE
Glucose, UA: NEGATIVE mg/dL
Hgb urine dipstick: NEGATIVE
Ketones, ur: NEGATIVE mg/dL
Leukocytes,Ua: NEGATIVE
Nitrite: NEGATIVE
Protein, ur: NEGATIVE mg/dL
Specific Gravity, Urine: 1.011 (ref 1.005–1.030)
pH: 6 (ref 5.0–8.0)

## 2022-09-26 LAB — HEPATIC FUNCTION PANEL
ALT: 9 U/L (ref 0–44)
AST: 18 U/L (ref 15–41)
Albumin: 3.3 g/dL — ABNORMAL LOW (ref 3.5–5.0)
Alkaline Phosphatase: 38 U/L (ref 38–126)
Bilirubin, Direct: 0.2 mg/dL (ref 0.0–0.2)
Indirect Bilirubin: 0.5 mg/dL (ref 0.3–0.9)
Total Bilirubin: 0.7 mg/dL (ref 0.3–1.2)
Total Protein: 6.1 g/dL — ABNORMAL LOW (ref 6.5–8.1)

## 2022-09-26 LAB — MAGNESIUM: Magnesium: 2.1 mg/dL (ref 1.7–2.4)

## 2022-09-26 LAB — T4, FREE: Free T4: 0.92 ng/dL (ref 0.61–1.12)

## 2022-09-26 LAB — TROPONIN I (HIGH SENSITIVITY)
Troponin I (High Sensitivity): 18 ng/L — ABNORMAL HIGH (ref <18)
Troponin I (High Sensitivity): 14 ng/L (ref <18)

## 2022-09-26 LAB — LACTIC ACID, PLASMA: Lactic Acid, Venous: 1.4 mmol/L (ref 0.5–1.9)

## 2022-09-26 LAB — TSH: TSH: 2.345 u[IU]/mL (ref 0.350–4.500)

## 2022-09-26 MED ORDER — QUETIAPINE FUMARATE 25 MG PO TABS: 25.0000 mg | ORAL_TABLET | Freq: Every day | ORAL | Status: DC

## 2022-09-26 MED ORDER — LABETALOL HCL 5 MG/ML IV SOLN
10.0000 mg | Freq: Once | INTRAVENOUS | Status: AC
  Administered 2022-09-26: 10 mg via INTRAVENOUS
  Filled 2022-09-26: qty 4

## 2022-09-26 MED ORDER — IOHEXOL 350 MG/ML SOLN
100.0000 mL | Freq: Once | INTRAVENOUS | Status: AC | PRN
  Administered 2022-09-26: 100 mL via INTRAVENOUS

## 2022-09-26 MED ORDER — QUETIAPINE FUMARATE 25 MG PO TABS
25.0000 mg | ORAL_TABLET | Freq: Once | ORAL | Status: AC
  Administered 2022-09-26: 25 mg via ORAL
  Filled 2022-09-26: qty 1

## 2022-09-26 MED ORDER — QUETIAPINE FUMARATE 25 MG PO TABS: 25.0000 mg | ORAL_TABLET | Freq: Every day | ORAL | 0 refills | Status: DC

## 2022-09-26 NOTE — ED Provider Notes (Signed)
5:46 PM Assumed care for off going team.   Blood pressure (!) 126/58, pulse 72, temperature 98.6 F (37 C), temperature source Oral, resp. rate 16, height '5\' 4"'$  (1.626 m), weight 47 kg, SpO2 95 %.  See their HPI for full report but in brief pending CT scan   Magnesium normal thyroid normal troponin was initially slightly elevated but downtrending most likely demand secondary to elevated blood pressure.  Urine without evidence of UTI BMP normal CBC normal COVID, flu are negative.  Lactate is normal.  I reviewed the CT head personally interpreted no evidence of intercranial hemorrhage  1. No acute intracranial hemorrhage. 2. 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 3. 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   IMPRESSION: 1. 4.3 cm diameter ascending thoracic aortic aneurysm. No change since previous study. No aortic dissection. Recommend annual imaging followup by CTA or MRA. This recommendation follows 2010 ACCF/AHA/AATS/ACR/ASA/SCA/SCAI/SIR/STS/SVM Guidelines for the Diagnosis and Management of Patients with Thoracic Aortic Disease. Circulation. 2010; 121: U981-X914. Aortic aneurysm NOS (ICD10-I71.9) 2. 6 mm right solid pulmonary nodule within the upper lobe. Per Fleischner Society Guidelines, recommend a non-contrast Chest CT at 6-12 months, then another non-contrast Chest CT at 18-24 months. These guidelines do not apply to immunocompromised patients and patients with cancer. Follow up in patients with significant comorbidities as clinically warranted. For lung cancer screening, adhere to Lung-RADS guidelines. Reference: Radiology. 2017; 284(1):228-43. 3. Patchy chronic inflammatory changes in the lungs similar to prior study. 4. Calcified and tortuous abdominal aorta without dissection. 5. 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 No bowel wall thickening or other changes to suggest mesenteric ischemia. 6. Emphysematous changes in the lungs.  Aortic atherosclerosis. 7. Heterogeneous nodular appearance of the thyroid gland with largest discrete nodule measuring 1.1 cm. No change since prior study. In the setting of significant comorbidities or limited life expectancy, no follow-up recommended (ref: J Am Coll Radiol. 2015 Feb;12(2): 143-50).  I discussed these findings with the son who is the POA.  We discussed the high-grade stenosis of the SMA but there is no abdominal tenderness no known rectal bleeding and she is got good collateral so at her age they would unlikely do anything about it unless there was bowel ischemia which at this time there is no evidence of that.  Family expressed understanding agree with that.    There is no easily reversable medically of her confusion and he does report her having prior episode of dementia leading to these kinds of confusion states.  Given patient is on hospice I did talk to the hospice nurse who connected me with Thornton Dales the NP for the hospice team.  She recommend doing Seroquel 25 mg at nighttime and do trial that to help with some of the agitation and that they should contact the hospice liaison tomorrow to help adjust medications further as needed.  I discussed this with family and they felt comfortable with the plan.  Patient will be given a dose of Seroquel now and will be discharged back to the facility.   Vanessa [place], MD 09/26/22 1946

## 2022-09-26 NOTE — ED Notes (Signed)
Attempted to call facility to give report on pt. Facility did not answer the phone. Family at bedside understanding of discharge instructions. Report also given to [facility]

## 2022-09-26 NOTE — ED Notes (Signed)
Pt gone to CT 

## 2022-09-26 NOTE — ED Triage Notes (Signed)
BIB [facility] from [facility] due to being more confused than normal, LNW 09/25/22  in PM. Pt received 2.'6mg'$  of morphine at [facility]. Pt c/o chest pain. 1 inch of nitro paste applied by EMS.   Pt with hx of dementia. Of note, EMS noted food particles on her dentures and had small episode of choking, back thrusts given by EMS. CBG 109, HR 70, 97% RA, CO2 32, hypertensive.

## 2022-09-26 NOTE — Discharge Instructions (Addendum)
There was workup was reassuring there was no signs of head bleed, UTI, dissection or other acute pathology.  I discussed with Thornton Dales one of the NP's for hospice and they recommended starting Seroquel nightly to try to help with some of her agitation.  She may need additional medications during the daytime as needed  Return to the ER if develops fevers worsening symptoms or any other concerns

## 2022-09-26 NOTE — ED Notes (Signed)
[facility] called for transport back to [facility]

## 2022-09-26 NOTE — ED Provider Notes (Signed)
[facility] Provider Note    Event Date/Time   First MD Initiated Contact with Patient 09/26/22 1246     (approximate)   History   Altered Mental Status and Chest Pain   HPI  Janice Hoffman is a 89 y.o. female   Past medical history of mention from facility, COPD, hypertension, aspiration pneumonia, prior CVA, atrial fibrillation who presents to the emergency department with altered mental status and complaints of chest pain from her facility over the last several days.  Hypertensive 210/100.  She received morphine at [facility] for chest pain prior to arrival and also some Nitropaste by EMS.  Had a small episode of choking reported by EMS and got back thrusts, normal hemodynamics aside from her hypertension.  Independent Historian contributed to assessment above: EMS  External Medical Documents Reviewed: Emergency department visit in December 2023 for concerns of pneumothorax on chest x-ray from her facility that was deemed not to be a pneumothorax, at that time emergency doctor spoke with family and confirmed DNR/DNI status and avoidance of invasive measures such as chest tube and focus on comfort.      Physical Exam   Triage Vital Signs: ED Triage Vitals  Enc Vitals Group     BP 09/26/22 1241 (!) 201/101     Pulse Rate 09/26/22 1241 80     Resp 09/26/22 1241 19     Temp 09/26/22 1241 98.6 F (37 C)     Temp Source 09/26/22 1241 Oral     SpO2 09/26/22 1241 96 %     Weight 09/26/22 1237 103 lb 9.9 oz (47 kg)     Height 09/26/22 1237 '5\' 4"'$  (1.626 m)     Head Circumference --      Peak Flow --      Pain Score --      Pain Loc --      Pain Edu? --      Excl. in [place]? --     Most recent vital signs: Vitals:   09/26/22 1445 09/26/22 1500  BP: (!) 188/112 (!) 184/89  Pulse: 85 82  Resp: 12 16  Temp:    SpO2: 99% 93%    General: Awake, no distress.  CV:  Good peripheral perfusion.  Resp:  Normal effort.  Abd:  No distention.   Other:  Appears to be in pain, disoriented, moving all extremities, abdomen soft nontender no rigidity, radial pulses intact bilaterally, maintaining airway breathing comfortably on room air no hypoxemia.   ED Results / Procedures / Treatments   Labs (all labs ordered are listed, but only abnormal results are displayed) Labs Reviewed  BASIC METABOLIC PANEL - Abnormal; Notable for the following components:      Result Value   Sodium 134 (*)    All other components within normal limits  URINALYSIS, ROUTINE W REFLEX MICROSCOPIC - Abnormal; Notable for the following components:   Color, Urine YELLOW (*)    APPearance CLEAR (*)    All other components within normal limits  TROPONIN I (HIGH SENSITIVITY) - Abnormal; Notable for the following components:   Troponin I (High Sensitivity) 18 (*)    All other components within normal limits  RESP PANEL BY RT-PCR (RSV, FLU A&B, COVID)  [id number]  CULTURE, BLOOD (ROUTINE X 2)  CULTURE, BLOOD (ROUTINE X 2)  URINE CULTURE  CBC  LACTIC ACID, PLASMA  TSH  T4, FREE  MAGNESIUM  LACTIC ACID, PLASMA  TROPONIN I (HIGH SENSITIVITY)  I ordered and reviewed the above labs they are notable for urinalysis without signs of urinary tract infection initial troponin of 18.  EKG  ED ECG REPORT I, Lucillie Garfinkel, the attending physician, personally viewed and interpreted this ECG.   Date: 09/26/2022  EKG Time: 1242  Rate: 74  Rhythm: nsr  Axis: nl  Intervals:none  ST&T Change: no acute ischemic changes    RADIOLOGY I independently reviewed and interpreted chest x-ray see no obvious focalities or pneumothorax.   PROCEDURES:  Critical Care performed: No  Procedures   MEDICATIONS ORDERED IN ED: Medications  labetalol (NORMODYNE) injection 10 mg (10 mg Intravenous Given 09/26/22 1449)  iohexol (OMNIPAQUE) 350 MG/ML injection 100 mL (100 mLs Intravenous Contrast Given 09/26/22 1414)     IMPRESSION / MDM / ASSESSMENT AND PLAN / ED COURSE  I  reviewed the triage vital signs and the nursing notes.                                Patient's presentation is most consistent with acute presentation with potential threat to life or bodily function.  Differential diagnosis includes, but is not limited to, hypertensive emergency, dissection or head bleed, ACS, respiratory infection, urinary tract infection, sepsis   The patient is on the cardiac monitor to evaluate for evidence of arrhythmia and/or significant heart rate changes.  MDM: Broad differential diagnosis in this patient with complaints of chest pain, hypertension and worsening altered mental status.  Sepsis evaluation with basic labs, urinalysis, chest x-ray, scan of the head as well as dissection protocol CT scan to assess for head bleed or dissection, EKG and serial troponins.  Control blood pressure with labetalol IV.  Pressure improved 180, patient appears comfortable.  I spoke with her son Mr. Knapke, workup ongoing, signed out to oncoming provider to discuss disposition after evaluation completed.         FINAL CLINICAL IMPRESSION(S) / about evaluation and treatment plan, pending labs and CT ED DIAGNOSES   Final diagnoses:  Atypical chest pain  Confusion  Hypertension, unspecified type     Rx / DC Orders   ED Discharge Orders     None        Note:  This document was prepared using Dragon voice recognition software and may include unintentional dictation errors.    Lucillie Garfinkel, MD 09/26/22 [phone]

## 2022-09-26 NOTE — ED Notes (Signed)
EMS arrived to transport pt. Pt stable at time of departure.

## 2022-09-27 ENCOUNTER — Non-Acute Institutional Stay (SKILLED_NURSING_FACILITY): Admitting: Student

## 2022-09-27 ENCOUNTER — Encounter: Payer: Self-pay | Admitting: Student

## 2022-09-27 DIAGNOSIS — I10 Essential (primary) hypertension: Secondary | ICD-10-CM

## 2022-09-27 DIAGNOSIS — G301 Alzheimer's disease with late onset: Secondary | ICD-10-CM | POA: Diagnosis not present

## 2022-09-27 DIAGNOSIS — F02C0 Dementia in other diseases classified elsewhere, severe, without behavioral disturbance, psychotic disturbance, mood disturbance, and anxiety: Secondary | ICD-10-CM

## 2022-09-27 DIAGNOSIS — I482 Chronic atrial fibrillation, unspecified: Secondary | ICD-10-CM | POA: Diagnosis not present

## 2022-09-27 DIAGNOSIS — L89512 Pressure ulcer of right ankle, stage 2: Secondary | ICD-10-CM

## 2022-09-27 DIAGNOSIS — S72402D Unspecified fracture of lower end of left femur, subsequent encounter for closed fracture with routine healing: Secondary | ICD-10-CM

## 2022-09-27 DIAGNOSIS — J439 Emphysema, unspecified: Secondary | ICD-10-CM

## 2022-09-27 LAB — URINE CULTURE: Culture: NO GROWTH

## 2022-09-27 NOTE — Progress Notes (Signed)
Location:  Other [place].  Nursing Home Room Number: [place] of Service:  SNF [phone]) Provider:  Dr. Dewayne Shorter  PCP: Baxter Hire, MD  Patient Care Team: Baxter Hire, MD as PCP - General (Internal Medicine)  Extended Emergency Contact Information Primary Emergency Contact: [place]  [place] [employer] Phone: [phone] Mobile Phone: [phone] Relation: Son Secondary Emergency Contact: [name] Address: [address]          [place], [place] [address] [name] Mobile Phone: [phone] Relation: Relative  Code Status:  DNR Goals of care: Advanced Directive information    09/27/2022    3:07 PM  Advanced Directives  Does Patient Have a Medical Advance Directive? Yes  Type of Advance Directive Out of facility DNR (pink MOST or yellow form)  Does patient want to make changes to medical advance directive? No - Patient declined     Chief Complaint  Patient presents with  . Acute Visit    ER Follow up    HPI:  Pt is a 89 y.o. female seen today for an acute visit and routine visit following ED evaluation. Patient was sent to the emergency department due to excruciating pain. She was inconsolable. Received seroquel 25 mg in the ED remains sleepy at this time. Son is at bedside and states it's unclear what caused the pain. Thorough work up with troponins, ECG, CT scan all stable and unchanged from previously. BP elevated at that time to 200/100 and likely the cause of troponin leak. Patient's BP today improved.    Past Medical History:  Diagnosis Date  . Asthma   . Closed displaced fracture of distal epiphysis of left femur with routine healing 05/13/2022  . COPD (chronic obstructive pulmonary disease) ([place])   . COPD with acute exacerbation ([place]) 03/14/2018  . Drop in hemoglobin 05/14/2022  . Herpes zoster   . Hypertension   . Neuralgia    Past Surgical History:  Procedure Laterality Date  . LEG SURGERY       Allergies  Allergen Reactions  . Aspirin Shortness Of Breath  . Nsaids Shortness Of Breath and Anaphylaxis    Throat swelling, shortness of breath  . Sulfa Antibiotics Other (See Comments)    Stomach pain Stomach pain   . Ceftriaxone Itching    Itching and redness near infusion site.   . Codeine Other (See Comments)    Short of breath   . Ciprofloxacin Hives  . Penicillins     Has patient had a PCN reaction causing immediate rash, facial/tongue/throat swelling, SOB or lightheadedness with hypotension: Unknown Has patient had a PCN reaction causing severe rash involving mucus membranes or skin necrosis: Unknown Has patient had a PCN reaction that required hospitalization: Unknown Has patient had a PCN reaction occurring within the last 10 years: Unknown If all of the above answers are "NO", then may proceed with Cephalosporin use.  Other reaction(s): Angioedema (ALLERGY/intolerance) Hives, swelling  . Phenol     Other reaction(s): Angioedema (ALLERGY/intolerance), Other (See Comments) Spasms, nerve symptoms Spasms, nerve symptoms  Other reaction(s): Angioedema (ALLERGY/intolerance) Spasms, nerve symptoms  . Promethazine Hcl     Outpatient Encounter Medications as of 09/27/2022  Medication Sig  . acetaminophen (TYLENOL) 650 MG CR tablet Take 650 mg by mouth every 8 (eight) hours as needed for pain.  Marland Kitchen clopidogrel (PLAVIX) 75 MG tablet Take 75 mg by mouth daily.  . cyanocobalamin (,VITAMIN B-12,) 1000 MCG/ML injection Inject 1,000 mcg into  the skin every 30 (thirty) days.   . fluticasone-salmeterol (ADVAIR) 500-50 MCG/ACT AEPB Inhale 1 puff into the lungs in the morning and at bedtime.  Marland Kitchen guaiFENesin (MUCINEX) 600 MG 12 hr tablet Take 1 tablet (600 mg total) by mouth 2 (two) times daily.  . Infant Care Products (DERMACLOUD) OINT Apply 1 application  topically as needed. Apply to peri-area/sacrum  . ipratropium-albuterol (DUONEB) 0.5-2.5 (3) MG/3ML SOLN Inhale 3 mLs into  the lungs every 6 (six) hours as needed.  Marland Kitchen LORazepam (ATIVAN) 0.5 MG tablet Take 0.5 mg by mouth every 4 (four) hours as needed for anxiety.  . melatonin 3 MG TABS tablet Take 3 mg by mouth at bedtime.  . metoprolol tartrate (LOPRESSOR) 25 MG tablet Take 1 tablet (25 mg total) by mouth 2 (two) times daily.  Marland Kitchen morphine 20 MG/5ML solution Take 0.25 mLs by mouth every 4 (four) hours as needed for pain.  . OXYGEN 2lmp every 24 hours as needed for Dyspnea or SOB  . polyethylene glycol (MIRALAX / GLYCOLAX) packet Take 17 g by mouth daily as needed.  . tiotropium (SPIRIVA) 18 MCG inhalation capsule Place 18 mcg into inhaler and inhale daily.  . traMADol (ULTRAM) 50 MG tablet Take 25 mg by mouth daily as needed. 30 minutes prior to therapy  . traZODone (DESYREL) 50 MG tablet Take 25 mg by mouth at bedtime.  . [DISCONTINUED] morphine 20 MG/5ML solution Take 0.6 mLs (2.4 mg total) by mouth every 4 (four) hours as needed for pain (air hunger, shortness of breath.). (Patient taking differently: Take 0.25 mLs by mouth every 4 (four) hours as needed for pain (air hunger, shortness of breath.).)  . [DISCONTINUED] QUEtiapine (SEROQUEL) 25 MG tablet Take 1 tablet (25 mg total) by mouth at bedtime for 15 days.   No facility-administered encounter medications on file as of 09/27/2022.    Review of Systems  All other systems reviewed and are negative.   Immunization History  Administered Date(s) Administered  . Influenza Inj Mdck Quad Pf 06/26/2016, 06/02/2019, 06/06/2021  . Influenza Split 06/07/2014  . Influenza Whole 06/07/2008  . Influenza, High Dose Seasonal PF 07/19/2018  . Influenza-Unspecified 06/25/2022  . PNEUMOCOCCAL CONJUGATE-20 12/12/2021  . Pneumococcal Conjugate-13 12/12/2021  . Pneumococcal Polysaccharide-23 10/29/2012   Pertinent  Health Maintenance Due  Topic Date Due  . DEXA SCAN  Never done  . INFLUENZA VACCINE  Completed      05/16/2022    9:59 PM 05/17/2022    9:00 AM 05/17/2022     8:10 PM 05/18/2022   10:00 AM 08/24/2022   11:29 AM  Fall Risk  (RETIRED) Patient Fall Risk Level High fall risk High fall risk High fall risk High fall risk High fall risk   Functional Status Survey:    Vitals:   09/27/22 1447  BP: (!) 154/77  Pulse: 80  Resp: 18  Temp: 98 F (36.7 C)  SpO2: 96%  Weight: 105 lb 6.4 oz (47.8 kg)  Height: '5\' 4"'$  (1.626 m)   Body mass index is 18.09 kg/m. Physical Exam Vitals reviewed.  Constitutional:      Comments: Falls asleep periodically during exam.   Cardiovascular:     Rate and Rhythm: Normal rate.     Pulses: Normal pulses.  Pulmonary:     Effort: Pulmonary effort is normal.  Abdominal:     Palpations: Abdomen is soft.  Neurological:     Mental Status: She is alert. Mental status is at baseline. She is disoriented.  Labs reviewed: Recent Labs    01/11/22 1333 05/12/22 0930 05/16/22 0638 08/24/22 1139 08/26/22 0000 09/26/22 1240 09/26/22 1526  NA 136   < > 139 140 140 134*  --   K 4.5   < > 4.0 4.5 4.1 3.8  --   CL 100   < > 103 103 104 99  --   CO2 26   < > 28 28 29* 26  --   GLUCOSE 117*   < > 99 134*  --  83  --   BUN 23   < > 31* '17 15 13  '$ --   CREATININE 0.67   < > 0.64 0.52 0.6 0.51  --   CALCIUM 9.3   < > 8.9 9.1 8.7 9.1  --   MG 2.4  --   --   --   --   --  2.1   < > = values in this interval not displayed.   Recent Labs    01/11/22 1333 08/24/22 1139 09/26/22 1500  AST '21 22 18  '$ ALT '12 12 9  '$ ALKPHOS 37* 45 38  BILITOT 0.9 0.9 0.7  PROT 7.5 7.1 6.1*  ALBUMIN 4.0 3.7 3.3*   Recent Labs    05/12/22 0930 05/14/22 0613 05/18/22 0631 08/24/22 1139 08/26/22 0000 09/26/22 1240  WBC 6.6   < > 9.2 8.7 5.6 5.5  NEUTROABS 4.1  --   --  7.8* 3,730.00  --   HGB 11.7*   < > 10.1* 12.6 11.6* 13.1  HCT 37.7   < > 31.6* 41.3 35* 41.4  MCV 89.3   < > 87.1 94.3  --  88.1  PLT 225   < > 268 217 168 211   < > = values in this interval not displayed.   Lab Results  Component Value Date   TSH 2.345  09/26/2022   Lab Results  Component Value Date   HGBA1C 5.2 07/19/2018   Lab Results  Component Value Date   CHOL  04/15/2008    193        ATP III CLASSIFICATION:  <200     mg/dL   Desirable  200-239  mg/dL   Borderline High  >=240    mg/dL   High   HDL 48 04/15/2008   LDLCALC (H) 04/15/2008    123        Total Cholesterol/HDL:CHD Risk Coronary Heart Disease Risk Table                     Men   Women  1/2 Average Risk   3.4   3.3   TRIG 111 04/15/2008   CHOLHDL 4.0 04/15/2008    Significant Diagnostic Results in last 30 days:  CT Angio Chest/Abd/Pel for Dissection W and/or Wo Contrast  Result Date: 09/26/2022 CLINICAL DATA:  Acute aortic syndrome suspected. Increased confusion. EXAM: CT ANGIOGRAPHY CHEST, ABDOMEN AND PELVIS TECHNIQUE: Non-contrast CT of the chest was initially obtained. Multidetector CT imaging through the chest, abdomen and pelvis was performed using the standard protocol during bolus administration of intravenous contrast. Multiplanar reconstructed images and MIPs were obtained and reviewed to evaluate the vascular anatomy. RADIATION DOSE REDUCTION: This exam was performed according to the departmental dose-optimization program which includes automated exposure control, adjustment of the mA and/or kV according to patient size and/or use of iterative reconstruction technique. CONTRAST:  151m OMNIPAQUE IOHEXOL 350 MG/ML SOLN COMPARISON:  CT chest 04/22/2018 FINDINGS: CTA CHEST FINDINGS Cardiovascular: Noncontrast  images of the chest demonstrate diffuse calcification of the aorta and coronary arteries. No evidence of intramural hematoma. Images obtained during aortic phase of contrast bolus administration demonstrate normal patent thoracic aorta. Ascending thoracic aortic aneurysm measuring 4.3 cm diameter, unchanged. No aortic dissection. Great vessel origins are patent. Central pulmonary arteries are well opacified without evidence of significant pulmonary embolus.  Normal heart size. No pericardial effusions. Mediastinum/Nodes: Diffuse heterogeneous nodularity of the thyroid gland is unchanged since prior study. Largest discrete nodule measures about 1.1 cm diameter. Esophagus is decompressed. No significant lymphadenopathy in the chest. Lungs/Pleura: Emphysematous changes in the lungs. Mild atelectasis in the lung base. Scattered peribronchial tree-in-bud infiltrates are similar to prior study suggesting chronic inflammatory process. Right apical nodule measuring 6 mm diameter. No change. Additional scattered smaller nodules are also demonstrated without change. No focal consolidation. Musculoskeletal: Degenerative changes in the spine. Thoracic scoliosis convex towards the right. Review of the MIP images confirms the above findings. CTA ABDOMEN AND PELVIS FINDINGS VASCULAR Aorta: Tortuous and patent abdominal aorta. No aneurysm or dissection. Diffuse aortic calcification. Celiac: The origin of the celiac axis is diminutive consistent with high-grade stenosis. Prompt reconstitution of vessels. SMA: Origin of the superior mesenteric artery is occluded with calcification. Collateral reconstitution of the mid/distal superior mesenteric artery. Renals: Single bilateral renal arteries are patent with calcification. IMA: Prominent patent inferior mesenteric artery providing collateral flow to the superior mesenteric artery distribution. Inflow: Diffuse calcification. No aneurysm or dissection. Focal areas of stenosis without occlusion. Veins: No obvious venous abnormality within the limitations of this arterial phase study. Review of the MIP images confirms the above findings. NON-VASCULAR Hepatobiliary: No focal liver abnormality is seen. No gallstones, gallbladder wall thickening, or biliary dilatation. Pancreas: Unremarkable. No pancreatic ductal dilatation or surrounding inflammatory changes. Spleen: Normal in size without focal abnormality. Adrenals/Urinary Tract: Adrenal  glands are unremarkable. Kidneys are normal, without renal calculi, focal lesion, or hydronephrosis. Bladder is unremarkable. Stomach/Bowel: Stomach, small bowel, and colon are not abnormally distended. No wall thickening or inflammatory changes. Appendix is not identified. Lymphatic: No significant lymphadenopathy. Reproductive: Status post hysterectomy. No adnexal masses. Other: No free air or free fluid in the abdomen. Abdominal wall musculature appears intact. Musculoskeletal: Degenerative changes in the spine and hips. Lumbar scoliosis convex towards the left. Review of the MIP images confirms the above findings. IMPRESSION: 1. 4.3 cm diameter ascending thoracic aortic aneurysm. No change since previous study. No aortic dissection. Recommend annual imaging followup by CTA or MRA. This recommendation follows 2010 ACCF/AHA/AATS/ACR/ASA/SCA/SCAI/SIR/STS/SVM Guidelines for the Diagnosis and Management of Patients with Thoracic Aortic Disease. Circulation. 2010; 121: T732-K025. Aortic aneurysm NOS (ICD10-I71.9) 2. 6 mm right solid pulmonary nodule within the upper lobe. Per Fleischner Society Guidelines, recommend a non-contrast Chest CT at 6-12 months, then another non-contrast Chest CT at 18-24 months. These guidelines do not apply to immunocompromised patients and patients with cancer. Follow up in patients with significant comorbidities as clinically warranted. For lung cancer screening, adhere to Lung-RADS guidelines. Reference: Radiology. 2017; 284(1):228-43. 3. Patchy chronic inflammatory changes in the lungs similar to prior study. 4. Calcified and tortuous abdominal aorta without dissection. 5. 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 No bowel wall thickening or other changes to suggest mesenteric ischemia. 6. Emphysematous changes in  the lungs.  Aortic atherosclerosis. 7. Heterogeneous nodular appearance of the thyroid gland with largest  discrete nodule measuring 1.1 cm. No change since prior study. In the setting of significant comorbidities or limited life expectancy, no follow-up recommended (ref: J Am Coll Radiol. 2015 Feb;12(2): 143-50). Electronically Signed   By: Lucienne Capers M.D.   On: 09/26/2022 16:38   CT Head Wo Contrast  Result Date: 09/26/2022 CLINICAL DATA:  Mental status change, unknown cause. EXAM: CT HEAD WITHOUT CONTRAST TECHNIQUE: Contiguous axial images were obtained from the base of the skull through the vertex without intravenous contrast. RADIATION DOSE REDUCTION: This exam was performed according to the departmental dose-optimization program which includes automated exposure control, adjustment of the mA and/or kV according to patient size and/or use of iterative reconstruction technique. COMPARISON:  Head CT 05/12/2022.  Brain MRI 05/31/2017. 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 Unchanged severe chronic small vessel disease. No acute intracranial hemorrhage. No hydrocephalus. No extra-axial collection. Unchanged 1.3 cm right parafalcine meningioma (image 24 of series 5). Basilar cisterns are patent. Vascular: No hyperdense vessel or unexpected calcification. Skull: No calvarial fracture or suspicious bone lesion. Skull base is unremarkable. Sinuses/Orbits: Partial opacification of all paranasal sinuses with air-fluid levels in the maxillary sinuses. Mastoids and middle ear cavities are well aerated. Orbits are unremarkable. Other: No scalp hematoma. IMPRESSION: 1. No acute intracranial hemorrhage. 2. 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 3. 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  Electronically Signed   By: Emmit Alexanders M.D.   On: 09/26/2022 14:44   DG Chest Port 1 View  Result Date: 09/26/2022 CLINICAL DATA:  Chest pain. Increased confusion. EXAM: PORTABLE CHEST 1 VIEW COMPARISON:  Radiograph 08/24/2022 FINDINGS: Chronic elevation of left hemidiaphragm. Unchanged heart size and mediastinal contours. Aortic atherosclerosis and tortuosity. Chronic left basilar atelectasis or scarring. No acute airspace disease. Coarsened lung markings which appears similar. No pleural effusion, pneumothorax or pulmonary edema. The bones are diffusely under mineralized. IMPRESSION: 1. No acute abnormality. 2. Chronic elevation of left hemidiaphragm with left basilar atelectasis or scarring. Electronically Signed   By: Keith Rake M.D.   On: 09/26/2022 13:23    Assessment/Plan There are no diagnoses linked to this encounter.   Family/ staff Communication: ***  Labs/tests ordered:  ***

## 2022-10-01 LAB — CULTURE, BLOOD (ROUTINE X 2)
Culture: NO GROWTH
Culture: NO GROWTH

## 2022-10-11 ENCOUNTER — Encounter: Payer: Self-pay | Admitting: Student

## 2022-10-11 ENCOUNTER — Non-Acute Institutional Stay (SKILLED_NURSING_FACILITY): Admitting: Student

## 2022-10-11 DIAGNOSIS — J441 Chronic obstructive pulmonary disease with (acute) exacerbation: Secondary | ICD-10-CM | POA: Diagnosis not present

## 2022-10-11 NOTE — Progress Notes (Unsigned)
Location:   [facility] Room Number: [place] of Service:  SNF [phone]) Provider:  Dewayne Shorter, MD  Baxter Hire, MD  Patient Care Team: Baxter Hire, MD as PCP - General (Internal Medicine)  Extended Emergency Contact Information Primary Emergency Contact: [place]  [place] Phone: [phone] Mobile Phone: [phone] Relation: Son Secondary Emergency Contact: [name] Address: [address]          [place], [place] [address] [name] Mobile Phone: [phone] Relation: Relative  Code Status:  DNR Goals of care: Advanced Directive information    09/27/2022    3:07 PM  Advanced Directives  Does Patient Have a Medical Advance Directive? Yes  Type of Advance Directive Out of facility DNR (pink MOST or yellow form)  Does patient want to make changes to medical advance directive? No - Patient declined     Chief Complaint  Patient presents with   Acute Visit    Shortness of breath    HPI:  Pt is a 89 y.o. female seen today for an acute visit for wheezing. Patient says her breathing is "terrible." She is oriented to self only.   Nursing states she has had worsening breathing and has required some morphine as well for increased respiratory efforts.    Past Medical History:  Diagnosis Date   Asthma    Closed displaced fracture of distal epiphysis of left femur with routine healing 05/13/2022   COPD (chronic obstructive pulmonary disease) (HCC)    COPD with acute exacerbation ([place]) 03/14/2018   Drop in hemoglobin 05/14/2022   Herpes zoster    Hypertension    Neuralgia    Past Surgical History:  Procedure Laterality Date   LEG SURGERY      Allergies  Allergen Reactions   Aspirin Shortness Of Breath   Nsaids Shortness Of Breath and Anaphylaxis    Throat swelling, shortness of breath   Sulfa Antibiotics Other (See Comments)    Stomach pain Stomach pain    Ceftriaxone  Itching    Itching and redness near infusion site.    Codeine Other (See Comments)    Short of breath    Ciprofloxacin Hives   Penicillins     Has patient had a PCN reaction causing immediate rash, facial/tongue/throat swelling, SOB or lightheadedness with hypotension: Unknown Has patient had a PCN reaction causing severe rash involving mucus membranes or skin necrosis: Unknown Has patient had a PCN reaction that required hospitalization: Unknown Has patient had a PCN reaction occurring within the last 10 years: Unknown If all of the above answers are "NO", then may proceed with Cephalosporin use.  Other reaction(s): Angioedema (ALLERGY/intolerance) Hives, swelling   Phenol     Other reaction(s): Angioedema (ALLERGY/intolerance), Other (See Comments) Spasms, nerve symptoms Spasms, nerve symptoms  Other reaction(s): Angioedema (ALLERGY/intolerance) Spasms, nerve symptoms   Promethazine Hcl     Allergies as of 10/11/2022       Reactions   Aspirin Shortness Of Breath   Nsaids Shortness Of Breath, Anaphylaxis   Throat swelling, shortness of breath   Sulfa Antibiotics Other (See Comments)   Stomach pain Stomach pain   Ceftriaxone Itching   Itching and redness near infusion site.    Codeine Other (See Comments)   Short of breath   Ciprofloxacin Hives   Penicillins    Has patient had a PCN reaction causing immediate rash, facial/tongue/throat swelling, SOB or lightheadedness with hypotension: Unknown Has patient had a  PCN reaction causing severe rash involving mucus membranes or skin necrosis: Unknown Has patient had a PCN reaction that required hospitalization: Unknown Has patient had a PCN reaction occurring within the last 10 years: Unknown If all of the above answers are "NO", then may proceed with Cephalosporin use. Other reaction(s): Angioedema (ALLERGY/intolerance) Hives, swelling   Phenol    Other reaction(s): Angioedema (ALLERGY/intolerance), Other (See  Comments) Spasms, nerve symptoms Spasms, nerve symptoms Other reaction(s): Angioedema (ALLERGY/intolerance) Spasms, nerve symptoms   Promethazine Hcl         Medication List        Accurate as of October 11, 2022 12:03 PM. If you have any questions, ask your nurse or doctor.          STOP taking these medications    tiotropium 18 MCG inhalation capsule Commonly known as: SPIRIVA Stopped by: Dewayne Shorter, MD       TAKE these medications    acetaminophen 650 MG CR tablet Commonly known as: TYLENOL Take 650 mg by mouth every 8 (eight) hours as needed for pain.   Anoro Ellipta 62.5-25 MCG/ACT Aepb Generic drug: umeclidinium-vilanterol Inhale 1 puff into the lungs daily.   clopidogrel 75 MG tablet Commonly known as: PLAVIX Take 75 mg by mouth daily.   cyanocobalamin 1000 MCG/ML injection Commonly known as: VITAMIN B12 Inject 1,000 mcg into the skin every 30 (thirty) days.   Dermacloud Oint Apply 1 application  topically as needed. Apply to peri-area/sacrum   fluticasone-salmeterol 500-50 MCG/ACT Aepb Commonly known as: ADVAIR Inhale 1 puff into the lungs in the morning and at bedtime.   guaiFENesin 600 MG 12 hr tablet Commonly known as: MUCINEX Take 1 tablet (600 mg total) by mouth 2 (two) times daily.   ipratropium-albuterol 0.5-2.5 (3) MG/3ML Soln Commonly known as: DUONEB Inhale 3 mLs into the lungs every 6 (six) hours as needed.   LORazepam 0.5 MG tablet Commonly known as: ATIVAN Take 0.5 mg by mouth every 4 (four) hours as needed for anxiety.   melatonin 3 MG Tabs tablet Take 3 mg by mouth at bedtime.   metoprolol tartrate 25 MG tablet Commonly known as: LOPRESSOR Take 1 tablet (25 mg total) by mouth 2 (two) times daily.   morphine 20 MG/5ML solution Take 0.25 mLs by mouth every 4 (four) hours as needed for pain.   OXYGEN 2lmp every 24 hours as needed for Dyspnea or SOB   polyethylene glycol 17 g packet Commonly known as: MIRALAX /  GLYCOLAX Take 17 g by mouth daily as needed.   traMADol 50 MG tablet Commonly known as: ULTRAM Take 25 mg by mouth daily as needed. 30 minutes prior to therapy   traZODone 50 MG tablet Commonly known as: DESYREL Take 25 mg by mouth at bedtime.        Review of Systems  Immunization History  Administered Date(s) Administered   Influenza Inj Mdck Quad Pf 06/26/2016, 06/02/2019, 06/06/2021   Influenza Split 06/07/2014   Influenza Whole 06/07/2008   Influenza, High Dose Seasonal PF 07/19/2018   Influenza-Unspecified 06/25/2022   PNEUMOCOCCAL CONJUGATE-20 12/12/2021   Pneumococcal Conjugate-13 12/12/2021   Pneumococcal Polysaccharide-23 10/29/2012   Pertinent  Health Maintenance Due  Topic Date Due   DEXA SCAN  Never done   INFLUENZA VACCINE  Completed      05/16/2022    9:59 PM 05/17/2022    9:00 AM 05/17/2022    8:10 PM 05/18/2022   10:00 AM 08/24/2022   11:29 AM  Fall Risk  (  RETIRED) Patient Fall Risk Level High fall risk High fall risk High fall risk High fall risk High fall risk   Functional Status Survey:    Vitals:   10/11/22 1141  BP: 127/72  Pulse: 70  Resp: (!) 22  Temp: (!) 97.4 F (36.3 C)  SpO2: 90%  Weight: 105 lb 6.4 oz (47.8 kg)  Height: '5\' 4"'$  (1.626 m)   Body mass index is 18.09 kg/m. Physical Exam Constitutional:      Comments: Thin, frail  Cardiovascular:     Rate and Rhythm: Normal rate.  Pulmonary:     Comments: Wheezing in all lung fields Skin:    General: Skin is warm and dry.  Neurological:     Mental Status: She is alert. Mental status is at baseline.     Labs reviewed: Recent Labs    01/11/22 1333 05/12/22 0930 05/16/22 0638 08/24/22 1139 08/26/22 0000 09/26/22 1240 09/26/22 1526  NA 136   < > 139 140 140 134*  --   K 4.5   < > 4.0 4.5 4.1 3.8  --   CL 100   < > 103 103 104 99  --   CO2 26   < > 28 28 29* 26  --   GLUCOSE 117*   < > 99 134*  --  83  --   BUN 23   < > 31* '17 15 13  '$ --   CREATININE 0.67   < > 0.64  0.52 0.6 0.51  --   CALCIUM 9.3   < > 8.9 9.1 8.7 9.1  --   MG 2.4  --   --   --   --   --  2.1   < > = values in this interval not displayed.   Recent Labs    01/11/22 1333 08/24/22 1139 09/26/22 1500  AST '21 22 18  '$ ALT '12 12 9  '$ ALKPHOS 37* 45 38  BILITOT 0.9 0.9 0.7  PROT 7.5 7.1 6.1*  ALBUMIN 4.0 3.7 3.3*   Recent Labs    05/12/22 0930 05/14/22 0613 05/18/22 0631 08/24/22 1139 08/26/22 0000 09/26/22 1240  WBC 6.6   < > 9.2 8.7 5.6 5.5  NEUTROABS 4.1  --   --  7.8* 3,730.00  --   HGB 11.7*   < > 10.1* 12.6 11.6* 13.1  HCT 37.7   < > 31.6* 41.3 35* 41.4  MCV 89.3   < > 87.1 94.3  --  88.1  PLT 225   < > 268 217 168 211   < > = values in this interval not displayed.   Lab Results  Component Value Date   TSH 2.345 09/26/2022   Lab Results  Component Value Date   HGBA1C 5.2 07/19/2018   Lab Results  Component Value Date   CHOL  04/15/2008    193        ATP III CLASSIFICATION:  <200     mg/dL   Desirable  200-239  mg/dL   Borderline High  >=240    mg/dL   High   HDL 48 04/15/2008   LDLCALC (H) 04/15/2008    123        Total Cholesterol/HDL:CHD Risk Coronary Heart Disease Risk Table                     Men   Women  1/2 Average Risk   3.4   3.3   TRIG 111 04/15/2008   CHOLHDL  4.0 04/15/2008    Significant Diagnostic Results in last 30 days:  CT Angio Chest/Abd/Pel for Dissection W and/or Wo Contrast  Result Date: 09/26/2022 CLINICAL DATA:  Acute aortic syndrome suspected. Increased confusion. EXAM: CT ANGIOGRAPHY CHEST, ABDOMEN AND PELVIS TECHNIQUE: Non-contrast CT of the chest was initially obtained. Multidetector CT imaging through the chest, abdomen and pelvis was performed using the standard protocol during bolus administration of intravenous contrast. Multiplanar reconstructed images and MIPs were obtained and reviewed to evaluate the vascular anatomy. RADIATION DOSE REDUCTION: This exam was performed according to the departmental dose-optimization  program which includes automated exposure control, adjustment of the mA and/or kV according to patient size and/or use of iterative reconstruction technique. CONTRAST:  134m OMNIPAQUE IOHEXOL 350 MG/ML SOLN COMPARISON:  CT chest 04/22/2018 FINDINGS: CTA CHEST FINDINGS Cardiovascular: Noncontrast images of the chest demonstrate diffuse calcification of the aorta and coronary arteries. No evidence of intramural hematoma. Images obtained during aortic phase of contrast bolus administration demonstrate normal patent thoracic aorta. Ascending thoracic aortic aneurysm measuring 4.3 cm diameter, unchanged. No aortic dissection. Great vessel origins are patent. Central pulmonary arteries are well opacified without evidence of significant pulmonary embolus. Normal heart size. No pericardial effusions. Mediastinum/Nodes: Diffuse heterogeneous nodularity of the thyroid gland is unchanged since prior study. Largest discrete nodule measures about 1.1 cm diameter. Esophagus is decompressed. No significant lymphadenopathy in the chest. Lungs/Pleura: Emphysematous changes in the lungs. Mild atelectasis in the lung base. Scattered peribronchial tree-in-bud infiltrates are similar to prior study suggesting chronic inflammatory process. Right apical nodule measuring 6 mm diameter. No change. Additional scattered smaller nodules are also demonstrated without change. No focal consolidation. Musculoskeletal: Degenerative changes in the spine. Thoracic scoliosis convex towards the right. Review of the MIP images confirms the above findings. CTA ABDOMEN AND PELVIS FINDINGS VASCULAR Aorta: Tortuous and patent abdominal aorta. No aneurysm or dissection. Diffuse aortic calcification. Celiac: The origin of the celiac axis is diminutive consistent with high-grade stenosis. Prompt reconstitution of vessels. SMA: Origin of the superior mesenteric artery is occluded with calcification. Collateral reconstitution of the mid/distal superior  mesenteric artery. Renals: Single bilateral renal arteries are patent with calcification. IMA: Prominent patent inferior mesenteric artery providing collateral flow to the superior mesenteric artery distribution. Inflow: Diffuse calcification. No aneurysm or dissection. Focal areas of stenosis without occlusion. Veins: No obvious venous abnormality within the limitations of this arterial phase study. Review of the MIP images confirms the above findings. NON-VASCULAR Hepatobiliary: No focal liver abnormality is seen. No gallstones, gallbladder wall thickening, or biliary dilatation. Pancreas: Unremarkable. No pancreatic ductal dilatation or surrounding inflammatory changes. Spleen: Normal in size without focal abnormality. Adrenals/Urinary Tract: Adrenal glands are unremarkable. Kidneys are normal, without renal calculi, focal lesion, or hydronephrosis. Bladder is unremarkable. Stomach/Bowel: Stomach, small bowel, and colon are not abnormally distended. No wall thickening or inflammatory changes. Appendix is not identified. Lymphatic: No significant lymphadenopathy. Reproductive: Status post hysterectomy. No adnexal masses. Other: No free air or free fluid in the abdomen. Abdominal wall musculature appears intact. Musculoskeletal: Degenerative changes in the spine and hips. Lumbar scoliosis convex towards the left. Review of the MIP images confirms the above findings. IMPRESSION: 1. 4.3 cm diameter ascending thoracic aortic aneurysm. No change since previous study. No aortic dissection. Recommend annual imaging followup by CTA or MRA. This recommendation follows 2010 ACCF/AHA/AATS/ACR/ASA/SCA/SCAI/SIR/STS/SVM Guidelines for the Diagnosis and Management of Patients with Thoracic Aortic Disease. Circulation. 2010; 121:: [id number] Aortic aneurysm NOS (ICD10-I71.9) 2. 6 mm right solid pulmonary nodule within the upper  lobe. Per Fleischner Society Guidelines, recommend a non-contrast Chest CT at 6-12 months, then another  non-contrast Chest CT at 18-24 months. These guidelines do not apply to immunocompromised patients and patients with cancer. Follow up in patients with significant comorbidities as clinically warranted. For lung cancer screening, adhere to Lung-RADS guidelines. Reference: Radiology. 2017; 284(1):228-43. 3. Patchy chronic inflammatory changes in the lungs similar to prior study. 4. Calcified and tortuous abdominal aorta without dissection. 5. 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 No bowel wall thickening or other changes to suggest mesenteric ischemia. 6. Emphysematous changes in the lungs.  Aortic atherosclerosis. 7. Heterogeneous nodular appearance of the thyroid gland with largest discrete nodule measuring 1.1 cm. No change since prior study. In the setting of significant comorbidities or limited life expectancy, no follow-up recommended (ref: J Am Coll Radiol. 2015 Feb;12(2): 143-50). Electronically Signed   By: Lucienne Capers M.D.   On: 09/26/2022 16:38   CT Head Wo Contrast  Result Date: 09/26/2022 CLINICAL DATA:  Mental status change, unknown cause. EXAM: CT HEAD WITHOUT CONTRAST TECHNIQUE: Contiguous axial images were obtained from the base of the skull through the vertex without intravenous contrast. RADIATION DOSE REDUCTION: This exam was performed according to the departmental dose-optimization program which includes automated exposure control, adjustment of the mA and/or kV according to patient size and/or use of iterative reconstruction technique. COMPARISON:  Head CT 05/12/2022.  Brain MRI 05/31/2017. 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 Unchanged severe chronic small vessel disease. No acute intracranial hemorrhage. No hydrocephalus. No extra-axial  collection. Unchanged 1.3 cm right parafalcine meningioma (image 24 of series 5). Basilar cisterns are patent. Vascular: No hyperdense vessel or unexpected calcification. Skull: No calvarial fracture or suspicious bone lesion. Skull base is unremarkable. Sinuses/Orbits: Partial opacification of all paranasal sinuses with air-fluid levels in the maxillary sinuses. Mastoids and middle ear cavities are well aerated. Orbits are unremarkable. Other: No scalp hematoma. IMPRESSION: 1. No acute intracranial hemorrhage. 2. 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 3. 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 Electronically Signed   By: Emmit Alexanders M.D.   On: 09/26/2022 14:44   DG Chest Port 1 View  Result Date: 09/26/2022 CLINICAL DATA:  Chest pain. Increased confusion. EXAM: PORTABLE CHEST 1 VIEW COMPARISON:  Radiograph 08/24/2022 FINDINGS: Chronic elevation of left hemidiaphragm. Unchanged heart size and mediastinal contours. Aortic atherosclerosis and tortuosity. Chronic left basilar atelectasis or scarring. No acute airspace disease. Coarsened lung markings which appears similar. No pleural effusion, pneumothorax or pulmonary edema. The bones are diffusely under mineralized. IMPRESSION: 1. No acute abnormality. 2. Chronic elevation of left hemidiaphragm with left basilar atelectasis or scarring. Electronically Signed   By: Keith Rake M.D.   On: 09/26/2022 13:23    Assessment/Plan COPD with acute exacerbation ([place]) Patient's exacerbations have become increasingly frequent. She is adherent to inhalers. PRN duoneb insuffiicent to aid with symptoms. Started Prednisone 40 mg 3d then '20mg'$  3d then 10 mg 3d then '5mg'$  indefinitely to help with symptom management. PRN ativan and morphine for increased WOB.    Family/ staff Communication: nursing  Labs/tests  ordered:

## 2022-10-18 ENCOUNTER — Non-Acute Institutional Stay (SKILLED_NURSING_FACILITY): Admitting: Student

## 2022-10-18 ENCOUNTER — Encounter: Payer: Self-pay | Admitting: Student

## 2022-10-18 DIAGNOSIS — T17908A Unspecified foreign body in respiratory tract, part unspecified causing other injury, initial encounter: Secondary | ICD-10-CM | POA: Diagnosis not present

## 2022-10-18 DIAGNOSIS — F02C0 Dementia in other diseases classified elsewhere, severe, without behavioral disturbance, psychotic disturbance, mood disturbance, and anxiety: Secondary | ICD-10-CM | POA: Diagnosis not present

## 2022-10-18 DIAGNOSIS — G301 Alzheimer's disease with late onset: Secondary | ICD-10-CM

## 2022-10-18 DIAGNOSIS — R1310 Dysphagia, unspecified: Secondary | ICD-10-CM | POA: Diagnosis not present

## 2022-10-18 NOTE — Progress Notes (Unsigned)
Location:  Other [facility] Room Number: [place] of Service:  SNF [id number]) Provider:  Dr. Dewayne Shorter  [id number], Chrystie Nose, MD  Patient Care Team: Baxter Hire, MD as PCP - General (Internal Medicine)  Extended Emergency Contact Information Primary Emergency Contact: [place]  [place] Phone: [phone] Mobile Phone: [phone] Relation: Son Secondary Emergency Contact: [name] Address: [address]          [place], [place] [address] [name] Mobile Phone: [phone] Relation: Relative  Code Status:  DNR Goals of care: Advanced Directive information    10/18/2022   11:10 AM  Advanced Directives  Does Patient Have a Medical Advance Directive? Yes  Type of Advance Directive Out of facility DNR (pink MOST or yellow form)  Does patient want to make changes to medical advance directive? No - Patient declined     Chief Complaint  Patient presents with   Acute Visit    Diet Changes.     HPI:  Pt is a 89 y.o. female seen today for an acute visit for follow up after SLP evaluation. Patient noted to have aspiration with meals. Patient is currently having lunch with regular diet tolerating fine. She says her breathing is okay and she feels fine.    Past Medical History:  Diagnosis Date   Asthma    Closed displaced fracture of distal epiphysis of left femur with routine healing 05/13/2022   COPD (chronic obstructive pulmonary disease) (HCC)    COPD with acute exacerbation ([place]) 03/14/2018   Drop in hemoglobin 05/14/2022   Herpes zoster    Hypertension    Neuralgia    Past Surgical History:  Procedure Laterality Date   LEG SURGERY      Allergies  Allergen Reactions   Aspirin Shortness Of Breath   Nsaids Shortness Of Breath and Anaphylaxis    Throat swelling, shortness of breath   Sulfa Antibiotics Other (See Comments)    Stomach pain Stomach pain    Ceftriaxone Itching     Itching and redness near infusion site.    Codeine Other (See Comments)    Short of breath    Ciprofloxacin Hives   Penicillins     Has patient had a PCN reaction causing immediate rash, facial/tongue/throat swelling, SOB or lightheadedness with hypotension: Unknown Has patient had a PCN reaction causing severe rash involving mucus membranes or skin necrosis: Unknown Has patient had a PCN reaction that required hospitalization: Unknown Has patient had a PCN reaction occurring within the last 10 years: Unknown If all of the above answers are "NO", then may proceed with Cephalosporin use.  Other reaction(s): Angioedema (ALLERGY/intolerance) Hives, swelling   Phenol     Other reaction(s): Angioedema (ALLERGY/intolerance), Other (See Comments) Spasms, nerve symptoms Spasms, nerve symptoms  Other reaction(s): Angioedema (ALLERGY/intolerance) Spasms, nerve symptoms   Promethazine Hcl     Outpatient Encounter Medications as of 10/18/2022  Medication Sig   acetaminophen (TYLENOL) 650 MG CR tablet Take 650 mg by mouth every 8 (eight) hours as needed for pain.   clopidogrel (PLAVIX) 75 MG tablet Take 75 mg by mouth daily.   cyanocobalamin (,VITAMIN B-12,) 1000 MCG/ML injection Inject 1,000 mcg into the skin every 30 (thirty) days.    fluticasone-salmeterol (ADVAIR) 500-50 MCG/ACT AEPB Inhale 1 puff into the lungs in the morning and at bedtime.   guaiFENesin (MUCINEX) 600 MG 12 hr tablet Take 1 tablet (600 mg total) by mouth 2 (two)  times daily.   Infant Care Products [facility]) OINT Apply 1 application  topically as needed. Apply to peri-area/sacrum   ipratropium-albuterol (DUONEB) 0.5-2.5 (3) MG/3ML SOLN Inhale 3 mLs into the lungs every 6 (six) hours as needed.   melatonin 3 MG TABS tablet Take 3 mg by mouth at bedtime.   metoprolol tartrate (LOPRESSOR) 25 MG tablet Take 1 tablet (25 mg total) by mouth 2 (two) times daily.   morphine 20 MG/5ML solution Take 0.25 mLs by mouth every 4 (four)  hours as needed for pain.   OXYGEN 2lmp every 24 hours as needed for Dyspnea or SOB   polyethylene glycol (MIRALAX / GLYCOLAX) packet Take 17 g by mouth daily as needed.   predniSONE (DELTASONE) 10 MG tablet Give one tablet by mouth once daily for 3 days, then 1/2 tablet by mouth daily for maintenance.   traMADol (ULTRAM) 50 MG tablet Take 25 mg by mouth daily as needed. 30 minutes prior to therapy   traZODone (DESYREL) 50 MG tablet Take 25 mg by mouth at bedtime.   umeclidinium bromide (INCRUSE ELLIPTA) 62.5 MCG/ACT AEPB Inhale 1 puff into the lungs daily.   [DISCONTINUED] LORazepam (ATIVAN) 0.5 MG tablet Take 0.5 mg by mouth every 4 (four) hours as needed for anxiety.   [DISCONTINUED] umeclidinium-vilanterol (ANORO ELLIPTA) 62.5-25 MCG/ACT AEPB Inhale 1 puff into the lungs daily.   No facility-administered encounter medications on file as of 10/18/2022.    Review of Systems  Immunization History  Administered Date(s) Administered   Influenza Inj Mdck Quad Pf 06/26/2016, 06/02/2019, 06/06/2021   Influenza Split 06/07/2014   Influenza Whole 06/07/2008   Influenza, High Dose Seasonal PF 07/19/2018   Influenza-Unspecified 06/25/2022   PNEUMOCOCCAL CONJUGATE-20 12/12/2021   Pneumococcal Conjugate-13 12/12/2021   Pneumococcal Polysaccharide-23 10/29/2012   Pertinent  Health Maintenance Due  Topic Date Due   DEXA SCAN  Never done   INFLUENZA VACCINE  Completed      05/16/2022    9:59 PM 05/17/2022    9:00 AM 05/17/2022    8:10 PM 05/18/2022   10:00 AM 08/24/2022   11:29 AM  Fall Risk  (RETIRED) Patient Fall Risk Level High fall risk High fall risk High fall risk High fall risk High fall risk   Functional Status Survey:    Vitals:   10/18/22 1101  BP: (!) 142/69  Pulse: 68  Resp: 18  Temp: 97.6 F (36.4 C)  SpO2: 93%  Weight: 100 lb 6.4 oz (45.5 kg)  Height: 5' 4"$  (1.626 m)   Body mass index is 17.23 kg/m. Physical Exam Constitutional:      Comments: Thin, frail   Cardiovascular:     Rate and Rhythm: Normal rate.     Pulses: Normal pulses.  Pulmonary:     Effort: Pulmonary effort is normal.  Abdominal:     General: Abdomen is flat.  Neurological:     Mental Status: She is alert. Mental status is at baseline.  Psychiatric:        Mood and Affect: Mood normal.        Behavior: Behavior normal.     Labs reviewed: Recent Labs    01/11/22 1333 05/12/22 0930 05/16/22 0638 08/24/22 1139 08/26/22 0000 09/26/22 1240 09/26/22 1526  NA 136   < > 139 140 140 134*  --   K 4.5   < > 4.0 4.5 4.1 3.8  --   CL 100   < > 103 103 104 99  --   CO2  26   < > 28 28 29* 26  --   GLUCOSE 117*   < > 99 134*  --  83  --   BUN 23   < > 31* 17 15 13  $ --   CREATININE 0.67   < > 0.64 0.52 0.6 0.51  --   CALCIUM 9.3   < > 8.9 9.1 8.7 9.1  --   MG 2.4  --   --   --   --   --  2.1   < > = values in this interval not displayed.   Recent Labs    01/11/22 1333 08/24/22 1139 09/26/22 1500  AST 21 22 18  $ ALT 12 12 9  $ ALKPHOS 37* 45 38  BILITOT 0.9 0.9 0.7  PROT 7.5 7.1 6.1*  ALBUMIN 4.0 3.7 3.3*   Recent Labs    05/12/22 0930 05/14/22 0613 05/18/22 0631 08/24/22 1139 08/26/22 0000 09/26/22 1240  WBC 6.6   < > 9.2 8.7 5.6 5.5  NEUTROABS 4.1  --   --  7.8* 3,730.00  --   HGB 11.7*   < > 10.1* 12.6 11.6* 13.1  HCT 37.7   < > 31.6* 41.3 35* 41.4  MCV 89.3   < > 87.1 94.3  --  88.1  PLT 225   < > 268 217 168 211   < > = values in this interval not displayed.   Lab Results  Component Value Date   TSH 2.345 09/26/2022   Lab Results  Component Value Date   HGBA1C 5.2 07/19/2018   Lab Results  Component Value Date   CHOL  04/15/2008    193        ATP III CLASSIFICATION:  <200     mg/dL   Desirable  200-239  mg/dL   Borderline High  >=240    mg/dL   High   HDL 48 04/15/2008   LDLCALC (H) 04/15/2008    123        Total Cholesterol/HDL:CHD Risk Coronary Heart Disease Risk Table                     Men   Women  1/2 Average Risk   3.4   3.3    TRIG 111 04/15/2008   CHOLHDL 4.0 04/15/2008    Significant Diagnostic Results in last 30 days:  CT Angio Chest/Abd/Pel for Dissection W and/or Wo Contrast  Result Date: 09/26/2022 CLINICAL DATA:  Acute aortic syndrome suspected. Increased confusion. EXAM: CT ANGIOGRAPHY CHEST, ABDOMEN AND PELVIS TECHNIQUE: Non-contrast CT of the chest was initially obtained. Multidetector CT imaging through the chest, abdomen and pelvis was performed using the standard protocol during bolus administration of intravenous contrast. Multiplanar reconstructed images and MIPs were obtained and reviewed to evaluate the vascular anatomy. RADIATION DOSE REDUCTION: This exam was performed according to the departmental dose-optimization program which includes automated exposure control, adjustment of the mA and/or kV according to patient size and/or use of iterative reconstruction technique. CONTRAST:  16m OMNIPAQUE IOHEXOL 350 MG/ML SOLN COMPARISON:  CT chest 04/22/2018 FINDINGS: CTA CHEST FINDINGS Cardiovascular: Noncontrast images of the chest demonstrate diffuse calcification of the aorta and coronary arteries. No evidence of intramural hematoma. Images obtained during aortic phase of contrast bolus administration demonstrate normal patent thoracic aorta. Ascending thoracic aortic aneurysm measuring 4.3 cm diameter, unchanged. No aortic dissection. Great vessel origins are patent. Central pulmonary arteries are well opacified without evidence of significant pulmonary embolus. Normal heart size. No  pericardial effusions. Mediastinum/Nodes: Diffuse heterogeneous nodularity of the thyroid gland is unchanged since prior study. Largest discrete nodule measures about 1.1 cm diameter. Esophagus is decompressed. No significant lymphadenopathy in the chest. Lungs/Pleura: Emphysematous changes in the lungs. Mild atelectasis in the lung base. Scattered peribronchial tree-in-bud infiltrates are similar to prior study suggesting chronic  inflammatory process. Right apical nodule measuring 6 mm diameter. No change. Additional scattered smaller nodules are also demonstrated without change. No focal consolidation. Musculoskeletal: Degenerative changes in the spine. Thoracic scoliosis convex towards the right. Review of the MIP images confirms the above findings. CTA ABDOMEN AND PELVIS FINDINGS VASCULAR Aorta: Tortuous and patent abdominal aorta. No aneurysm or dissection. Diffuse aortic calcification. Celiac: The origin of the celiac axis is diminutive consistent with high-grade stenosis. Prompt reconstitution of vessels. SMA: Origin of the superior mesenteric artery is occluded with calcification. Collateral reconstitution of the mid/distal superior mesenteric artery. Renals: Single bilateral renal arteries are patent with calcification. IMA: Prominent patent inferior mesenteric artery providing collateral flow to the superior mesenteric artery distribution. Inflow: Diffuse calcification. No aneurysm or dissection. Focal areas of stenosis without occlusion. Veins: No obvious venous abnormality within the limitations of this arterial phase study. Review of the MIP images confirms the above findings. NON-VASCULAR Hepatobiliary: No focal liver abnormality is seen. No gallstones, gallbladder wall thickening, or biliary dilatation. Pancreas: Unremarkable. No pancreatic ductal dilatation or surrounding inflammatory changes. Spleen: Normal in size without focal abnormality. Adrenals/Urinary Tract: Adrenal glands are unremarkable. Kidneys are normal, without renal calculi, focal lesion, or hydronephrosis. Bladder is unremarkable. Stomach/Bowel: Stomach, small bowel, and colon are not abnormally distended. No wall thickening or inflammatory changes. Appendix is not identified. Lymphatic: No significant lymphadenopathy. Reproductive: Status post hysterectomy. No adnexal masses. Other: No free air or free fluid in the abdomen. Abdominal wall musculature appears  intact. Musculoskeletal: Degenerative changes in the spine and hips. Lumbar scoliosis convex towards the left. Review of the MIP images confirms the above findings. IMPRESSION: 1. 4.3 cm diameter ascending thoracic aortic aneurysm. No change since previous study. No aortic dissection. Recommend annual imaging followup by CTA or MRA. This recommendation follows 2010 ACCF/AHA/AATS/ACR/ASA/SCA/SCAI/SIR/STS/SVM Guidelines for the Diagnosis and Management of Patients with Thoracic Aortic Disease. Circulation. 2010; [id number]. Aortic aneurysm NOS (ICD10-I71.9) 2. 6 mm right solid pulmonary nodule within the upper lobe. Per Fleischner Society Guidelines, recommend a non-contrast Chest CT at 6-12 months, then another non-contrast Chest CT at 18-24 months. These guidelines do not apply to immunocompromised patients and patients with cancer. Follow up in patients with significant comorbidities as clinically warranted. For lung cancer screening, adhere to Lung-RADS guidelines. Reference: Radiology. 2017; 284(1):228-43. 3. Patchy chronic inflammatory changes in the lungs similar to prior study. 4. Calcified and tortuous abdominal aorta without dissection. 5. 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 No bowel wall thickening or other changes to suggest mesenteric ischemia. 6. Emphysematous changes in the lungs.  Aortic atherosclerosis. 7. Heterogeneous nodular appearance of the thyroid gland with largest discrete nodule measuring 1.1 cm. No change since prior study. In the setting of significant comorbidities or limited life expectancy, no follow-up recommended (ref: J Am Coll Radiol. 2015 Feb;12(2): 143-50). Electronically Signed   By: Lucienne Capers M.D.   On: 09/26/2022 16:38   CT Head Wo Contrast  Result Date: 09/26/2022 CLINICAL DATA:  Mental  status change, unknown cause. EXAM: CT  HEAD WITHOUT CONTRAST TECHNIQUE: Contiguous axial images were obtained from the base of the skull through the vertex without intravenous contrast. RADIATION DOSE REDUCTION: This exam was performed according to the departmental dose-optimization program which includes automated exposure control, adjustment of the mA and/or kV according to patient size and/or use of iterative reconstruction technique. COMPARISON:  Head CT 05/12/2022.  Brain MRI 05/31/2017. 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 Unchanged severe chronic small vessel disease. No acute intracranial hemorrhage. No hydrocephalus. No extra-axial collection. Unchanged 1.3 cm right parafalcine meningioma (image 24 of series 5). Basilar cisterns are patent. Vascular: No hyperdense vessel or unexpected calcification. Skull: No calvarial fracture or suspicious bone lesion. Skull base is unremarkable. Sinuses/Orbits: Partial opacification of all paranasal sinuses with air-fluid levels in the maxillary sinuses. Mastoids and middle ear cavities are well aerated. Orbits are unremarkable. Other: No scalp hematoma. IMPRESSION: 1. No acute intracranial hemorrhage. 2. 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 3. 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 Electronically Signed   By: Emmit Alexanders M.D.   On: 09/26/2022 14:44   DG Chest Port 1 View  Result Date: 09/26/2022 CLINICAL DATA:  Chest pain. Increased confusion. EXAM: PORTABLE CHEST 1 VIEW COMPARISON:  Radiograph 08/24/2022 FINDINGS: Chronic elevation of left hemidiaphragm. Unchanged heart size and mediastinal contours. Aortic atherosclerosis and tortuosity. Chronic left basilar atelectasis or scarring. No acute airspace disease. Coarsened lung markings  which appears similar. No pleural effusion, pneumothorax or pulmonary edema. The bones are diffusely under mineralized. IMPRESSION: 1. No acute abnormality. 2. Chronic elevation of left hemidiaphragm with left basilar atelectasis or scarring. Electronically Signed   By: Keith Rake M.D.   On: 09/26/2022 13:23    Assessment/Plan Aspiration into airway, initial encounter  Dysphagia, unspecified type  Severe late onset Alzheimer's dementia without behavioral disturbance, psychotic disturbance, mood disturbance, or anxiety ([place]) Patient with notable aspiration during SLP. Nursing asking to keep diet the same given how well patient is eating and has gained weight in recent months. Will plan to discuss further with son determine goals of care as this is a risk for recurrent aspiration pneumonia which would likely be terminal for her.   Family/ staff Communication: nursing, will plan to call son at a later date   Labs/tests ordered:  none

## 2022-11-26 IMAGING — DX DG CHEST 1V PORT
1 series · 2 of 2 positions shown · non-contrast
Comparison: Chest x-ray July 17, 2019.

CLINICAL DATA: productive cough

EXAM:
PORTABLE CHEST 1 VIEW

[Series 1: chest ap · 0.14mm/px · 2 of 2 slices shown]
[im 1/2]
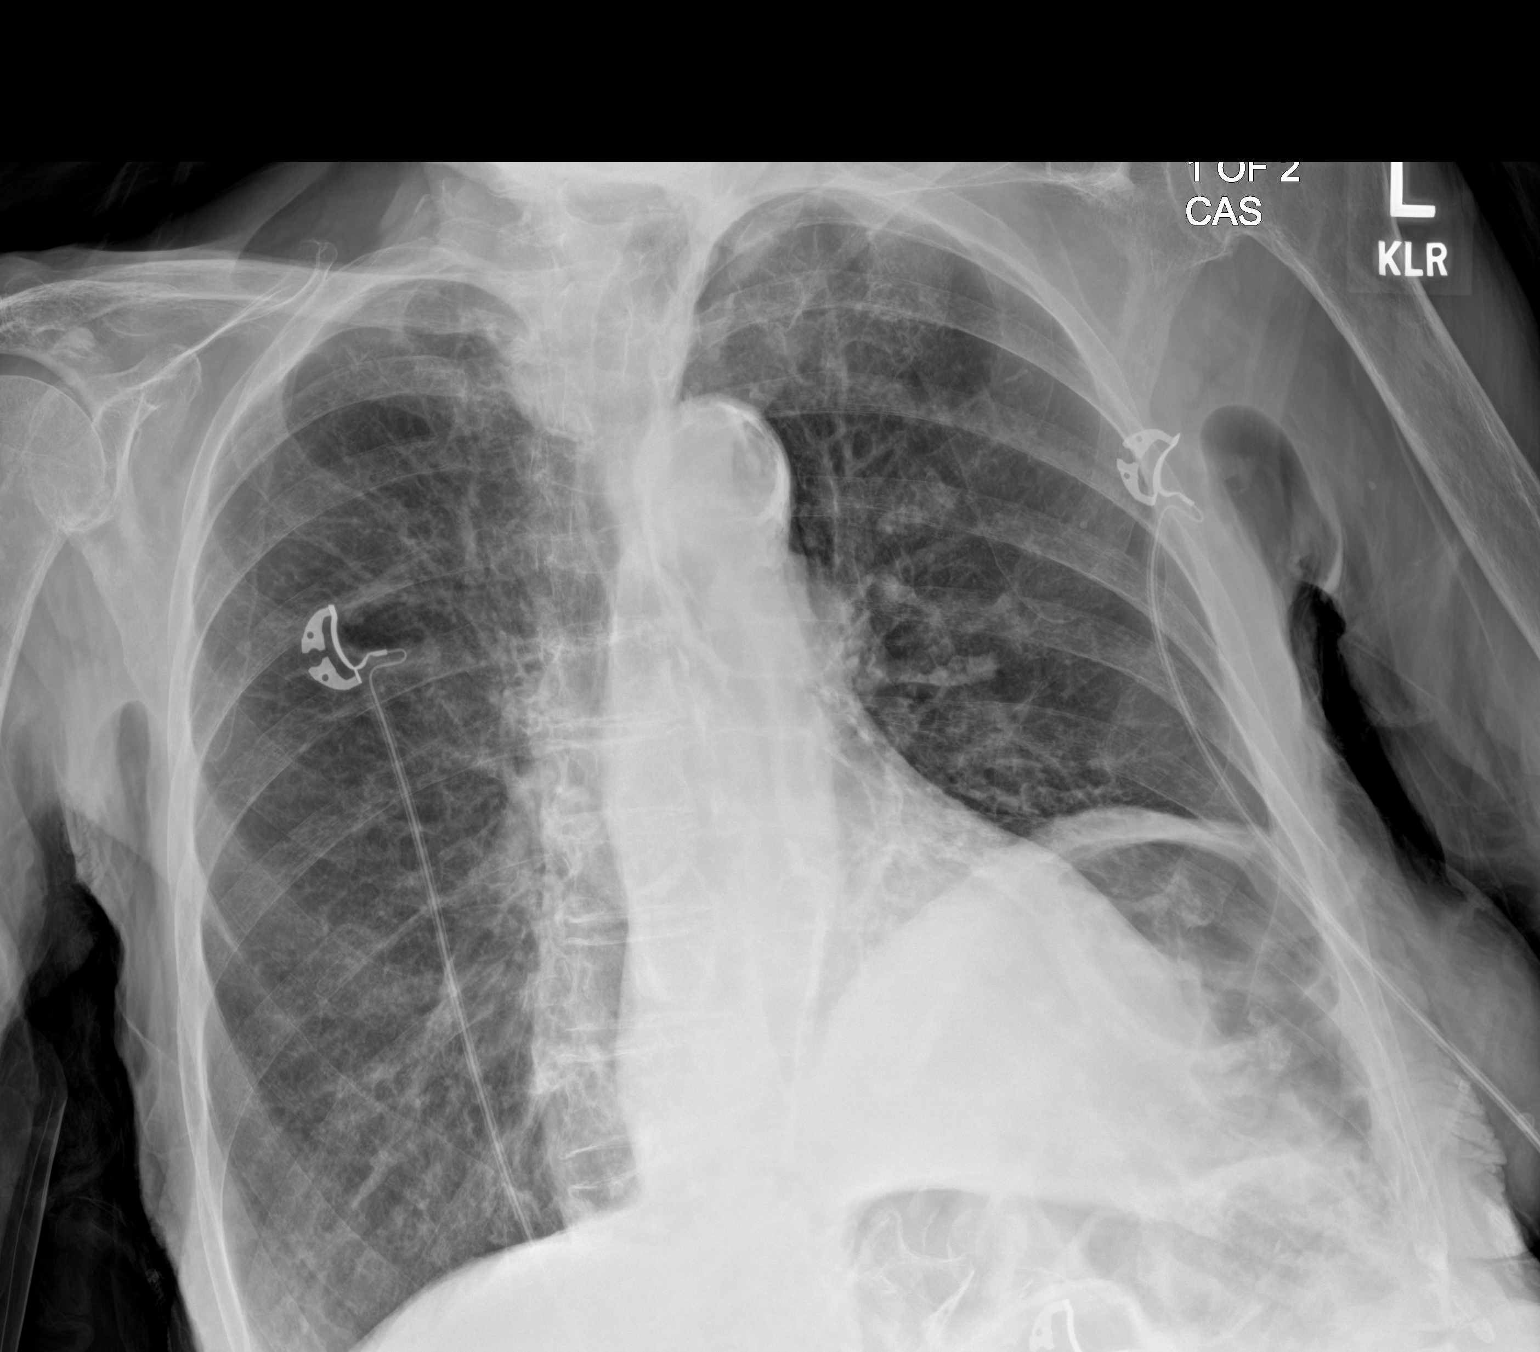
[im 2/2]
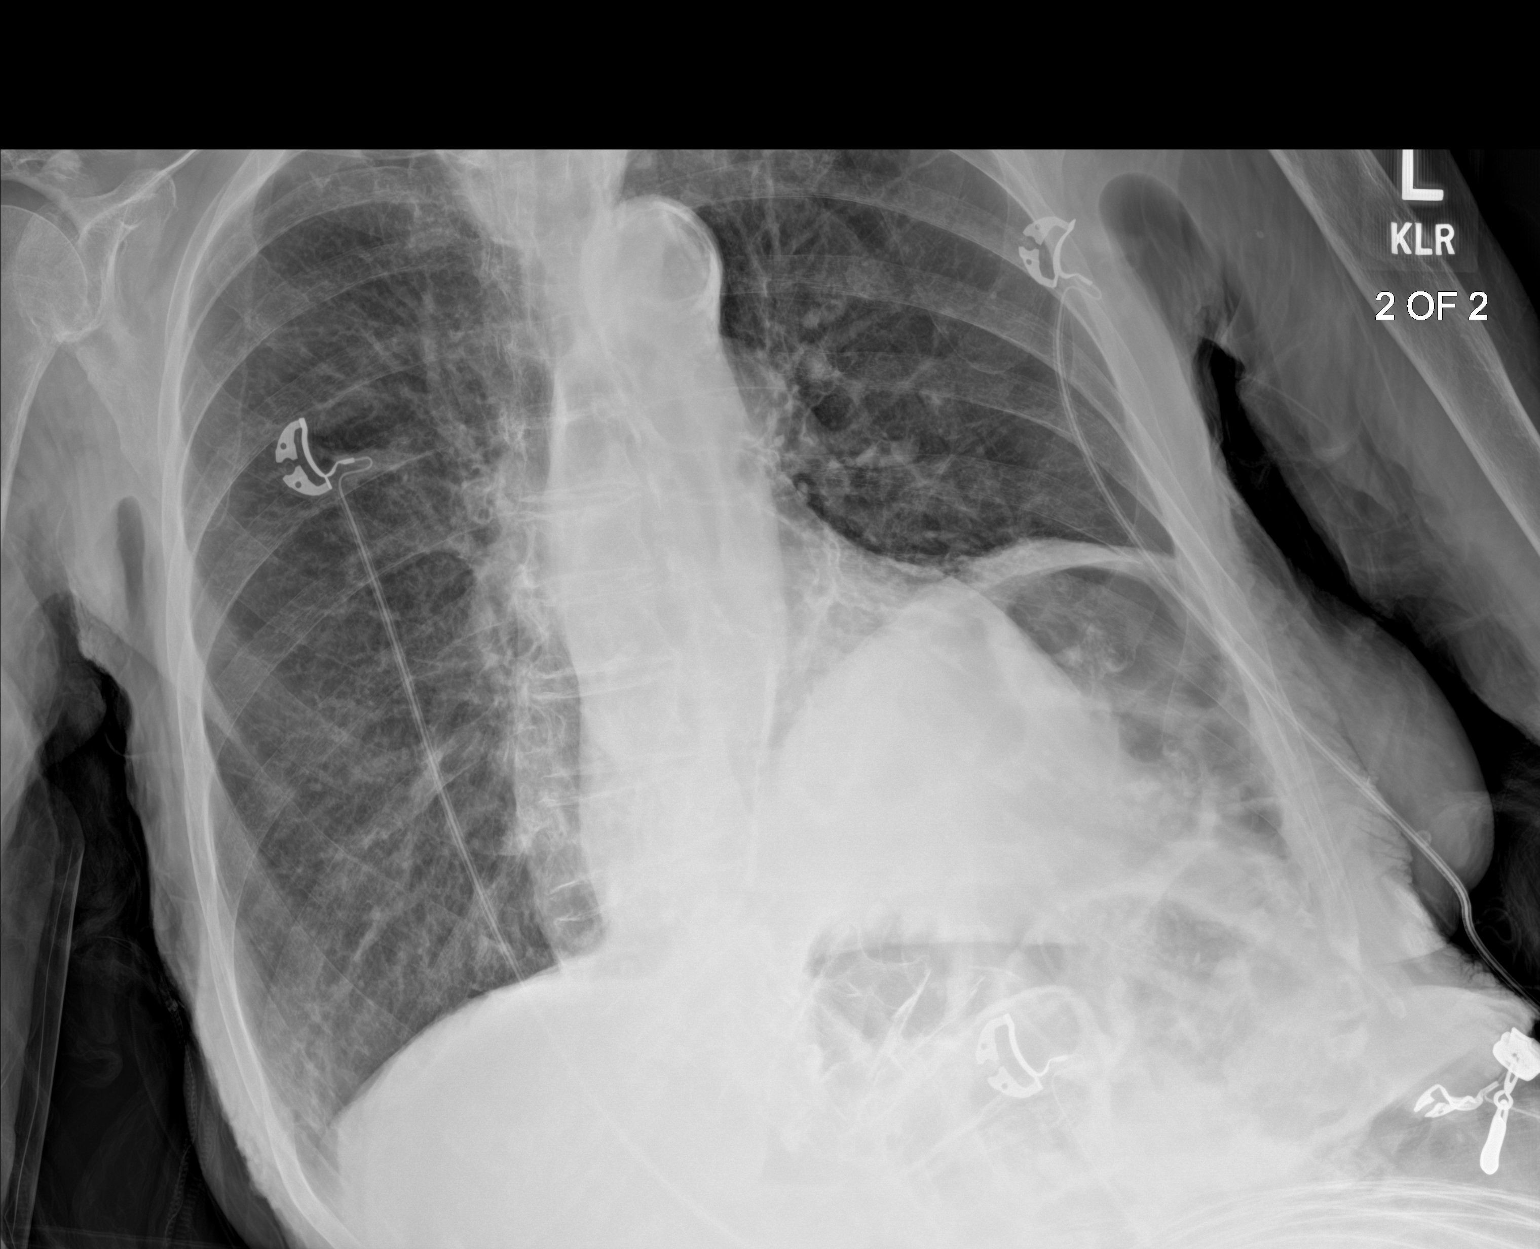

[2 of 2 positions shown; findings below may reference images not displayed]

FINDINGS: Mild streaky right basilar opacities. No confluent consolidation. No
visible pleural effusions or pneumothorax. Chronically elevated left
hemidiaphragm. Cardiomediastinal silhouette is within normal limits.
No displaced fracture. Polyarticular degenerative change.
IMPRESSION: Mild streaky right basilar opacities, which could represent
atelectasis and/or pneumonia. No confluent consolidation.

## 2022-11-28 ENCOUNTER — Encounter: Payer: Self-pay | Admitting: Nurse Practitioner

## 2022-11-28 ENCOUNTER — Non-Acute Institutional Stay (SKILLED_NURSING_FACILITY): Payer: PPO | Admitting: Nurse Practitioner

## 2022-11-28 DIAGNOSIS — I10 Essential (primary) hypertension: Secondary | ICD-10-CM | POA: Diagnosis not present

## 2022-11-28 DIAGNOSIS — R1310 Dysphagia, unspecified: Secondary | ICD-10-CM

## 2022-11-28 DIAGNOSIS — F02C0 Dementia in other diseases classified elsewhere, severe, without behavioral disturbance, psychotic disturbance, mood disturbance, and anxiety: Secondary | ICD-10-CM

## 2022-11-28 DIAGNOSIS — I482 Chronic atrial fibrillation, unspecified: Secondary | ICD-10-CM

## 2022-11-28 DIAGNOSIS — G301 Alzheimer's disease with late onset: Secondary | ICD-10-CM | POA: Diagnosis not present

## 2022-11-28 DIAGNOSIS — J439 Emphysema, unspecified: Secondary | ICD-10-CM

## 2022-11-28 NOTE — Progress Notes (Signed)
Location:  Other [place].  Nursing Home Room Number: [place] of Service:  [phone]) Provider: Sherrie Mustache, NP  [id number], Jordan Hawks, MD  Patient Care Team: Dewayne Shorter, MD as PCP - [facility])  Extended Emergency Contact Information Primary Emergency Contact: [place]  [place] Phone: [phone] Mobile Phone: [phone] Relation: Son Secondary Emergency Contact: [name] Address: [address]          [place], [place] [address] [name] Mobile Phone: [phone] Relation: Relative  Goals of care: Advanced Directive information    11/28/2022   10:44 AM  Advanced Directives  Does Patient Have a Medical Advance Directive? Yes  Type of Advance Directive Out of facility DNR (pink MOST or yellow form)  Does patient want to make changes to medical advance directive? No - Patient declined     Chief Complaint  Patient presents with   Medical Management of Chronic Issues    Medical Management of Chronic Issues. NCIR Verified.     HPI:  Pt is a 89 y.o. female seen today for medical management of chronic disease. Pt with hx of COPD on hospice services.  She has actually been doing quite well and hospice plans to sign off at the end of the month.  She continues to have frequent wheezing with shortness of breath and frequent episodes of aspiration but maintained with dietary changes, aspiration precautions and duonebs PRN.  She is gaining weight at this time.  No complaints of pain or discomfort.  Staff without acute concerns.  Past Medical History:  Diagnosis Date   Asthma    Closed displaced fracture of distal epiphysis of left femur with routine healing 05/13/2022   COPD (chronic obstructive pulmonary disease) (HCC)    COPD with acute exacerbation ([place]) 03/14/2018   Drop in hemoglobin 05/14/2022   Herpes zoster    Hypertension    Neuralgia    Past Surgical History:  Procedure  Laterality Date   LEG SURGERY      Allergies  Allergen Reactions   Aspirin Shortness Of Breath   Nsaids Shortness Of Breath and Anaphylaxis    Throat swelling, shortness of breath   Sulfa Antibiotics Other (See Comments)    Stomach pain Stomach pain    Ceftriaxone Itching    Itching and redness near infusion site.    Codeine Other (See Comments)    Short of breath    Ciprofloxacin Hives   Penicillins     Has patient had a PCN reaction causing immediate rash, facial/tongue/throat swelling, SOB or lightheadedness with hypotension: Unknown Has patient had a PCN reaction causing severe rash involving mucus membranes or skin necrosis: Unknown Has patient had a PCN reaction that required hospitalization: Unknown Has patient had a PCN reaction occurring within the last 10 years: Unknown If all of the above answers are "NO", then may proceed with Cephalosporin use.  Other reaction(s): Angioedema (ALLERGY/intolerance) Hives, swelling   Phenol     Other reaction(s): Angioedema (ALLERGY/intolerance), Other (See Comments) Spasms, nerve symptoms Spasms, nerve symptoms  Other reaction(s): Angioedema (ALLERGY/intolerance) Spasms, nerve symptoms   Promethazine Hcl     Outpatient Encounter Medications as of 11/28/2022  Medication Sig   acetaminophen (TYLENOL) 650 MG CR tablet Take 650 mg by mouth every 8 (eight) hours as needed for pain.   clopidogrel (PLAVIX) 75 MG tablet Take 75 mg by mouth daily.   cyanocobalamin (,VITAMIN B-12,) 1000 MCG/ML injection Inject 1,000 mcg into the skin every  30 (thirty) days.    fluticasone-salmeterol (ADVAIR) 500-50 MCG/ACT AEPB Inhale 1 puff into the lungs in the morning and at bedtime.   guaiFENesin (MUCINEX) 600 MG 12 hr tablet Take 1 tablet (600 mg total) by mouth 2 (two) times daily.   Infant Care Products [facility]) OINT Apply 1 application  topically as needed. Apply to peri-area/sacrum   ipratropium-albuterol (DUONEB) 0.5-2.5 (3) MG/3ML SOLN  Inhale 3 mLs into the lungs every 6 (six) hours as needed.   melatonin 3 MG TABS tablet Take 3 mg by mouth at bedtime.   metoprolol tartrate (LOPRESSOR) 25 MG tablet Take 1 tablet (25 mg total) by mouth 2 (two) times daily.   morphine 20 MG/5ML solution Take 0.25 mLs by mouth every 4 (four) hours as needed for pain.   OXYGEN 2lmp every 24 hours as needed for Dyspnea or SOB   polyethylene glycol (MIRALAX / GLYCOLAX) packet Take 17 g by mouth daily as needed.   predniSONE (DELTASONE) 10 MG tablet Give one tablet by mouth once daily for 3 days, then 1/2 tablet by mouth daily for maintenance.   traMADol (ULTRAM) 50 MG tablet Take 25 mg by mouth daily as needed. 30 minutes prior to therapy   traZODone (DESYREL) 50 MG tablet Take 25 mg by mouth at bedtime.   umeclidinium bromide (INCRUSE ELLIPTA) 62.5 MCG/ACT AEPB Inhale 1 puff into the lungs daily.   No facility-administered encounter medications on file as of 11/28/2022.    Review of Systems  Unable to perform ROS: Dementia     Immunization History  Administered Date(s) Administered   Influenza Inj Mdck Quad Pf 06/26/2016, 06/02/2019, 06/06/2021   Influenza Split 06/07/2014   Influenza Whole 06/07/2008   Influenza, High Dose Seasonal PF 07/19/2018   Influenza-Unspecified 06/25/2022   PNEUMOCOCCAL CONJUGATE-20 12/12/2021   Pneumococcal Conjugate-13 12/12/2021   Pneumococcal Polysaccharide-23 10/29/2012   Pertinent  Health Maintenance Due  Topic Date Due   DEXA SCAN  Never done   INFLUENZA VACCINE  Completed      05/16/2022    9:59 PM 05/17/2022    9:00 AM 05/17/2022    8:10 PM 05/18/2022   10:00 AM 08/24/2022   11:29 AM  Fall Risk  (RETIRED) Patient Fall Risk Level High fall risk High fall risk High fall risk High fall risk High fall risk   Functional Status Survey:    Vitals:   11/28/22 1035 11/28/22 1154  BP: (!) 144/68 (!) 148/82  Pulse: (!) 55   Resp: 20   Temp: 97.7 F (36.5 C)   SpO2: 92%   Weight: 108 lb 12.8 oz (49.4  kg)   Height: 5\' 4"  (1.626 m)    Body mass index is 18.68 kg/m. Physical Exam Constitutional:      General: She is not in acute distress.    Appearance: She is well-developed. She is not diaphoretic.     Comments: Thin female, NAD  HENT:     Head: Normocephalic and atraumatic.     Mouth/Throat:     Pharynx: No oropharyngeal exudate.  Eyes:     Conjunctiva/sclera: Conjunctivae normal.     Pupils: Pupils are equal, round, and reactive to light.  Cardiovascular:     Rate and Rhythm: Normal rate and regular rhythm.     Heart sounds: Normal heart sounds.  Pulmonary:     Effort: Pulmonary effort is normal.     Breath sounds: Wheezing (throughout) present.  Abdominal:     General: Bowel sounds are normal.  Palpations: Abdomen is soft.  Musculoskeletal:     Cervical back: Normal range of motion and neck supple.     Right lower leg: No edema.     Left lower leg: No edema.  Skin:    General: Skin is warm and dry.  Neurological:     Mental Status: She is alert.  Psychiatric:        Mood and Affect: Mood normal.     Labs reviewed: Recent Labs    01/11/22 1333 05/12/22 0930 05/16/22 0638 08/24/22 1139 08/26/22 0000 09/26/22 1240 09/26/22 1526  NA 136   < > 139 140 140 134*  --   K 4.5   < > 4.0 4.5 4.1 3.8  --   CL 100   < > 103 103 104 99  --   CO2 26   < > 28 28 29* 26  --   GLUCOSE 117*   < > 99 134*  --  83  --   BUN 23   < > 31* 17 15 13   --   CREATININE 0.67   < > 0.64 0.52 0.6 0.51  --   CALCIUM 9.3   < > 8.9 9.1 8.7 9.1  --   MG 2.4  --   --   --   --   --  2.1   < > = values in this interval not displayed.   Recent Labs    01/11/22 1333 08/24/22 1139 09/26/22 1500  AST 21 22 18   ALT 12 12 9   ALKPHOS 37* 45 38  BILITOT 0.9 0.9 0.7  PROT 7.5 7.1 6.1*  ALBUMIN 4.0 3.7 3.3*   Recent Labs    05/12/22 0930 05/14/22 0613 05/18/22 0631 08/24/22 1139 08/26/22 0000 09/26/22 1240  WBC 6.6   < > 9.2 8.7 5.6 5.5  NEUTROABS 4.1  --   --  7.8* 3,730.00   --   HGB 11.7*   < > 10.1* 12.6 11.6* 13.1  HCT 37.7   < > 31.6* 41.3 35* 41.4  MCV 89.3   < > 87.1 94.3  --  88.1  PLT 225   < > 268 217 168 211   < > = values in this interval not displayed.   Lab Results  Component Value Date   TSH 2.345 09/26/2022   Lab Results  Component Value Date   HGBA1C 5.2 07/19/2018   Lab Results  Component Value Date   CHOL  04/15/2008    193        ATP III CLASSIFICATION:  <200     mg/dL   Desirable  200-239  mg/dL   Borderline High  >=240    mg/dL   High   HDL 48 04/15/2008   LDLCALC (H) 04/15/2008    123        Total Cholesterol/HDL:CHD Risk Coronary Heart Disease Risk Table                     Men   Women  1/2 Average Risk   3.4   3.3   TRIG 111 04/15/2008   CHOLHDL 4.0 04/15/2008    Significant Diagnostic Results in last 30 days:  No results found.  Assessment/Plan 1. Pulmonary emphysema, unspecified emphysema type ([place]) -ongoing, stable at this time. Continues to have frequent wheezing, continues on duonebs q 6 hours PRN and prednisone 5 mg daily, advair BID  2. Dysphagia, unspecified type -continues aspiration precautions with dietary modifications  3.  Severe late onset Alzheimer's dementia without behavioral disturbance, psychotic disturbance, mood disturbance, or anxiety (HCC) -Stable, no acute changes in cognitive or functional status, continue supportive care with staff and family.  4. Essential hypertension -Blood pressure controlled Continue current medications and dietary modifications follow metabolic panel  5. Chronic atrial fibrillation (HCC) -rate controlled on metoprolol  Continues on plavix only     Dio Giller K. [place], [facility] [phone]

## 2022-12-09 LAB — CBC AND DIFFERENTIAL
HCT: 35 — AB (ref 36–46)
Hemoglobin: 10.9 — AB (ref 12.0–16.0)
Neutrophils Absolute: 5822
Platelets: 233 10*3/uL (ref 150–400)
WBC: 7.9

## 2022-12-09 LAB — CBC: RBC: 4.03 (ref 3.87–5.11)

## 2022-12-12 LAB — CBC AND DIFFERENTIAL
HCT: 35 — AB (ref 36–46)
Hemoglobin: 10.9 — AB (ref 12.0–16.0)
Neutrophils Absolute: 5822
Platelets: 233 10*3/uL (ref 150–400)
WBC: 7.9

## 2022-12-12 LAB — CBC: RBC: 4.03 (ref 3.87–5.11)

## 2023-01-09 ENCOUNTER — Non-Acute Institutional Stay (INDEPENDENT_AMBULATORY_CARE_PROVIDER_SITE_OTHER): Payer: PPO | Admitting: Nurse Practitioner

## 2023-01-09 ENCOUNTER — Encounter: Payer: Self-pay | Admitting: Nurse Practitioner

## 2023-01-09 DIAGNOSIS — Z0000 Encounter for general adult medical examination without abnormal findings: Secondary | ICD-10-CM

## 2023-01-09 NOTE — Progress Notes (Signed)
Subjective:   Janice Hoffman is a 89 y.o. female who presents for Medicare Annual (Subsequent) preventive examination.  Review of Systems     Cardiac Risk Factors include: family history of premature cardiovascular disease;advanced age (>83men, >66 women);hypertension;sedentary lifestyle     Objective:    Today's Vitals   01/09/23 1139 01/09/23 1156  BP: (!) 144/72 134/66  Pulse: 68   Resp: 20   Temp: 97.7 F (36.5 C)   SpO2: 93%   Weight: 110 lb (49.9 kg)   Height: 5\' 4"  (1.626 m)    Body mass index is 18.88 kg/m.     01/09/2023   11:46 AM 11/28/2022   10:44 AM 10/18/2022   11:10 AM 09/27/2022    3:07 PM 09/26/2022   12:39 PM 09/24/2022   12:57 PM 08/26/2022    1:45 PM  Advanced Directives  Does Patient Have a Medical Advance Directive? Yes Yes Yes Yes Yes Yes Yes  Type of Advance Directive Out of facility DNR (pink MOST or yellow form) Out of facility DNR (pink MOST or yellow form) Out of facility DNR (pink MOST or yellow form) Out of facility DNR (pink MOST or yellow form) Out of facility DNR (pink MOST or yellow form) Out of facility DNR (pink MOST or yellow form) Out of facility DNR (pink MOST or yellow form)  Does patient want to make changes to medical advance directive? No - Patient declined No - Patient declined No - Patient declined No - Patient declined  No - Patient declined No - Patient declined  Pre-existing out of facility DNR order (yellow form or pink MOST form)      Pink MOST form placed in chart (order not valid for inpatient use);Yellow form placed in chart (order not valid for inpatient use)     Current Medications (verified) Outpatient Encounter Medications as of 01/09/2023  Medication Sig   acetaminophen (TYLENOL) 650 MG CR tablet Take 650 mg by mouth every 8 (eight) hours as needed for pain.   clopidogrel (PLAVIX) 75 MG tablet Take 75 mg by mouth daily.   cyanocobalamin (,VITAMIN B-12,) 1000 MCG/ML injection Inject 1,000 mcg into the skin every 30  (thirty) days.    fluticasone-salmeterol (ADVAIR) 500-50 MCG/ACT AEPB Inhale 1 puff into the lungs in the morning and at bedtime.   guaiFENesin (MUCINEX) 600 MG 12 hr tablet Take 1 tablet (600 mg total) by mouth 2 (two) times daily.   Infant Care Products [facility]) OINT Apply 1 application  topically as needed. Apply to peri-area/sacrum   ipratropium-albuterol (DUONEB) 0.5-2.5 (3) MG/3ML SOLN Inhale 3 mLs into the lungs every 6 (six) hours as needed.   melatonin 3 MG TABS tablet Take 3 mg by mouth at bedtime.   metoprolol tartrate (LOPRESSOR) 25 MG tablet Take 1 tablet (25 mg total) by mouth 2 (two) times daily.   OXYGEN 2lmp every 24 hours as needed for Dyspnea or SOB   polyethylene glycol (MIRALAX / GLYCOLAX) packet Take 17 g by mouth daily as needed.   predniSONE (DELTASONE) 10 MG tablet Take 5 mg by mouth daily.  for maintenance.   traMADol (ULTRAM) 50 MG tablet Take 25 mg by mouth daily as needed. 30 minutes prior to therapy   traZODone (DESYREL) 50 MG tablet Take 25 mg by mouth at bedtime. Give 25mg  by mouth every 12 hours as needed.   umeclidinium bromide (INCRUSE ELLIPTA) 62.5 MCG/ACT AEPB Inhale 1 puff into the lungs daily.   [DISCONTINUED] morphine 20 MG/5ML solution  Take 0.25 mLs by mouth every 4 (four) hours as needed for pain.   No facility-administered encounter medications on file as of 01/09/2023.    Allergies (verified) Aspirin, Nsaids, Sulfa antibiotics, Ceftriaxone, Codeine, Ciprofloxacin, Penicillins, Phenol, and Promethazine hcl   History: Past Medical History:  Diagnosis Date   Asthma    Closed displaced fracture of distal epiphysis of left femur with routine healing 05/13/2022   COPD (chronic obstructive pulmonary disease) (HCC)    COPD with acute exacerbation (HCC) 03/14/2018   Drop in hemoglobin 05/14/2022   Herpes zoster    Hypertension    Neuralgia    Past Surgical History:  Procedure Laterality Date   LEG SURGERY     Family History  Problem Relation  Age of Onset   COPD Sister    Social History   Socioeconomic History   Marital status: Widowed    Spouse name: Not on file   Number of children: Not on file   Years of education: Not on file   Highest education level: Not on file  Occupational History   Occupation: retired  Tobacco Use   Smoking status: Never   Smokeless tobacco: Never  Vaping Use   Vaping Use: Never used  Substance and Sexual Activity   Alcohol use: No    Alcohol/week: 0.0 standard drinks of alcohol   Drug use: No   Sexual activity: Not Currently  Other Topics Concern   Not on file  Social History Narrative   Not on file   Social Determinants of Health   Financial Resource Strain: Unknown (07/19/2018)   Overall Financial Resource Strain (CARDIA)    Difficulty of Paying Living Expenses: Patient declined  Food Insecurity: Unknown (07/19/2018)   Hunger Vital Sign    Worried About Running Out of Food in the Last Year: Patient declined    Ran Out of Food in the Last Year: Patient declined  Transportation Needs: Unknown (07/19/2018)   PRAPARE - Administrator, Civil Service (Medical): Patient declined    Lack of Transportation (Non-Medical): Patient declined  Physical Activity: Unknown (07/19/2018)   Exercise Vital Sign    Days of Exercise per Week: Patient declined    Minutes of Exercise per Session: Patient declined  Stress: Not on file  Social Connections: Unknown (07/19/2018)   Social Connection and Isolation Panel [NHANES]    Frequency of Communication with Friends and Family: Patient declined    Frequency of Social Gatherings with Friends and Family: Patient declined    Attends Religious Services: Patient declined    Database administrator or Organizations: Patient declined    Attends Engineer, structural: Patient declined    Marital Status: Patient declined    Tobacco Counseling Counseling given: Not Answered   Clinical Intake:  Pre-visit preparation completed:  Yes  Pain : No/denies pain     BMI - recorded: 18.8 Nutritional Risks: None Diabetes: No  How often do you need to have someone help you when you read instructions, pamphlets, or other written materials from your doctor or pharmacy?: 5 - Always  Diabetic?no         Activities of Daily Living    01/09/2023    3:40 PM 05/13/2022    1:56 PM  In your present state of health, do you have any difficulty performing the following activities:  Hearing? 1 1  Vision? 0 0  Difficulty concentrating or making decisions? 1 1  Walking or climbing stairs? 1 1  Dressing or bathing?  1 1  Doing errands, shopping? 1 1  Preparing Food and eating ? Y   Using the Toilet? Y   In the past six months, have you accidently leaked urine? Y   Do you have problems with loss of bowel control? Y   Managing your Medications? Y   Managing your Finances? Y   Housekeeping or managing your Housekeeping? Y     Patient Care Team: Earnestine Mealing, MD as PCP - General (Family Medicine)  Indicate any recent Medical Services you may have received from other than [facility] providers in the past year (date may be approximate).     Assessment:   This is a routine wellness examination for Janice Hoffman.  Hearing/Vision screen No results found.  Dietary issues and exercise activities discussed: Current Exercise Habits: The patient does not participate in regular exercise at present   Goals Addressed   None    Depression Screen     No data to display          Fall Risk    01/09/2023    3:41 PM  Fall Risk   Falls in the past year? 1  Number falls in past yr: 1  Injury with Fall? 0  Risk for fall due to : History of fall(s);Impaired balance/gait;Impaired mobility  Follow up Education provided;Falls evaluation completed    FALL RISK PREVENTION PERTAINING TO THE HOME:  Any stairs in or around the home? No  If so, are there any without handrails?  na Home free of loose throw rugs in walkways, pet beds,  electrical cords, etc? Yes  Adequate lighting in your home to reduce risk of falls? Yes   ASSISTIVE DEVICES UTILIZED TO PREVENT FALLS:  Life alert? No  Use of a cane, walker or w/c? Yes  Grab bars in the bathroom? Yes  Shower chair or bench in shower? Yes  Elevated toilet seat or a handicapped toilet? Yes   TIMED UP AND GO:  Was the test performed? No .   Cognitive Function:        Immunizations Immunization History  Administered Date(s) Administered   Influenza Inj Mdck Quad Pf 06/26/2016, 06/02/2019, 06/06/2021   Influenza Split 06/07/2014   Influenza Whole 06/07/2008   Influenza, High Dose Seasonal PF 07/19/2018   Influenza-Unspecified 06/25/2022   PNEUMOCOCCAL CONJUGATE-20 12/12/2021   Pneumococcal Conjugate-13 12/12/2021   Pneumococcal Polysaccharide-23 10/29/2012    TDAP status: Due, Education has been provided regarding the importance of this vaccine. Advised may receive this vaccine at local pharmacy or Health Dept. Aware to provide a copy of the vaccination record if obtained from local pharmacy or Health Dept. Verbalized acceptance and understanding.  Flu Vaccine status: Up to date  Pneumococcal vaccine status: Up to date  Covid-19 vaccine status: Information provided on how to obtain vaccines.   Qualifies for Shingles Vaccine? Yes   Zostavax completed No   Shingrix Completed?: Yes  Screening Tests Health Maintenance  Topic Date Due   DTaP/Tdap/Td (1 - Tdap) Never done   Zoster Vaccines- Shingrix (1 of 2) Never done   INFLUENZA VACCINE  04/10/2023   Pneumonia Vaccine 31+ Years old  Completed   HPV VACCINES  Aged Out   DEXA SCAN  Discontinued   COVID-19 Vaccine  Discontinued    Health Maintenance  Health Maintenance Due  Topic Date Due   DTaP/Tdap/Td (1 - Tdap) Never done   Zoster Vaccines- Shingrix (1 of 2) Never done    Colorectal cancer screening: No longer required.  Mammogram status: No longer required due to age.  Declines bone  density  Lung Cancer Screening: (Low Dose CT Chest recommended if Age 2-80 years, 30 pack-year currently smoking OR have quit w/in 15years.) does not qualify.   Lung Cancer Screening Referral: na  Additional Screening:  Hepatitis C Screening: does not qualify; Completed na  Vision Screening: Recommended annual ophthalmology exams for early detection of glaucoma and other disorders of the eye. Is the patient up to date with their annual eye exam?  No    Dental Screening: Recommended annual dental exams for proper oral hygiene  Community Resource Referral / Chronic Care Management: CRR required this visit?  No   CCM required this visit?  No      Plan:     I have personally reviewed and noted the following in the patient's chart:   Medical and social history Use of alcohol, tobacco or illicit drugs  Current medications and supplements including opioid prescriptions. Patient is not currently taking opioid prescriptions. Functional ability and status Nutritional status Physical activity Advanced directives List of other physicians Hospitalizations, surgeries, and ER visits in previous 12 months Vitals Screenings to include cognitive, depression, and falls Referrals and appointments  In addition, I have reviewed and discussed with patient certain preventive protocols, quality metrics, and best practice recommendations. A written personalized care plan for preventive services as well as general preventive health recommendations were provided to patient.     Sharon Seller, NP   01/09/2023   Place of service: [facility]

## 2023-01-22 ENCOUNTER — Non-Acute Institutional Stay (SKILLED_NURSING_FACILITY): Payer: PPO | Admitting: Student

## 2023-01-22 ENCOUNTER — Encounter: Payer: Self-pay | Admitting: Student

## 2023-01-22 DIAGNOSIS — I482 Chronic atrial fibrillation, unspecified: Secondary | ICD-10-CM

## 2023-01-22 DIAGNOSIS — G301 Alzheimer's disease with late onset: Secondary | ICD-10-CM | POA: Diagnosis not present

## 2023-01-22 DIAGNOSIS — J439 Emphysema, unspecified: Secondary | ICD-10-CM

## 2023-01-22 DIAGNOSIS — I10 Essential (primary) hypertension: Secondary | ICD-10-CM | POA: Diagnosis not present

## 2023-01-22 DIAGNOSIS — S72402D Unspecified fracture of lower end of left femur, subsequent encounter for closed fracture with routine healing: Secondary | ICD-10-CM

## 2023-01-22 DIAGNOSIS — F02C0 Dementia in other diseases classified elsewhere, severe, without behavioral disturbance, psychotic disturbance, mood disturbance, and anxiety: Secondary | ICD-10-CM

## 2023-01-22 DIAGNOSIS — R1310 Dysphagia, unspecified: Secondary | ICD-10-CM

## 2023-01-22 NOTE — Progress Notes (Addendum)
Location:  Other [facility].  Nursing Home Room Number: [facility] 112A Place of Service:  SNF [phone]) Provider:  Earnestine Mealing, MD  Patient Care Team: Earnestine Mealing, MD as PCP - General [facility] Medicine)  Extended Emergency Contact Information Primary Emergency Contact: Kathman,Dr. Theadora Rama [place] of [place] Home Phone: [phone] Mobile Phone: [phone] Relation: Son Secondary Emergency Contact: Thereasa Distance Address: [address]          [place], [place] [address] Darden Amber of [place] Mobile Phone: [phone] Relation: Relative  Code Status:  DNR Goals of care: Advanced Directive information    01/22/2023   11:24 AM  Advanced Directives  Does Patient Have a Medical Advance Directive? Yes  Type of Advance Directive Out of facility DNR (pink MOST or yellow form)  Does patient want to make changes to medical advance directive? No - Patient declined     Chief Complaint  Patient presents with   Medical Management of Chronic Issues    Medical Management of Chronic Issues.     HPI:  Pt is a 89 y.o. female seen today for medical management of chronic diseases.    She is alert. Smiling. She says "I'm alright, ready for lunch." She tells me her son's name is Mellody Dance. She denies pain or discomfort at this time.   Nursing without concerns at this time.    Past Medical History:  Diagnosis Date   Asthma    Closed displaced fracture of distal epiphysis of left femur with routine healing 05/13/2022   COPD (chronic obstructive pulmonary disease) (HCC)    COPD with acute exacerbation (HCC) 03/14/2018   Drop in hemoglobin 05/14/2022   Herpes zoster    Hypertension    Neuralgia    Past Surgical History:  Procedure Laterality Date   LEG SURGERY      Allergies  Allergen Reactions   Aspirin Shortness Of Breath   Nsaids Shortness Of Breath and Anaphylaxis    Throat swelling, shortness of breath   Sulfa Antibiotics Other (See Comments)    Stomach pain Stomach  pain    Ceftriaxone Itching    Itching and redness near infusion site.    Codeine Other (See Comments)    Short of breath    Ciprofloxacin Hives   Penicillins     Has patient had a PCN reaction causing immediate rash, facial/tongue/throat swelling, SOB or lightheadedness with hypotension: Unknown Has patient had a PCN reaction causing severe rash involving mucus membranes or skin necrosis: Unknown Has patient had a PCN reaction that required hospitalization: Unknown Has patient had a PCN reaction occurring within the last 10 years: Unknown If all of the above answers are "NO", then may proceed with Cephalosporin use.  Other reaction(s): Angioedema (ALLERGY/intolerance) Hives, swelling   Phenol     Other reaction(s): Angioedema (ALLERGY/intolerance), Other (See Comments) Spasms, nerve symptoms Spasms, nerve symptoms  Other reaction(s): Angioedema (ALLERGY/intolerance) Spasms, nerve symptoms   Promethazine Hcl     Outpatient Encounter Medications as of 01/22/2023  Medication Sig   acetaminophen (TYLENOL) 650 MG CR tablet Take 650 mg by mouth every 8 (eight) hours as needed for pain.   benzocaine (HURRICAINE) 20 % GEL Use as directed 1 Application in the mouth or throat daily as needed.   clopidogrel (PLAVIX) 75 MG tablet Take 75 mg by mouth daily.   cyanocobalamin (,VITAMIN B-12,) 1000 MCG/ML injection Inject 1,000 mcg into the skin every 30 (thirty) days.    fluticasone-salmeterol (ADVAIR) 500-50 MCG/ACT AEPB Inhale 1 puff into  the lungs in the morning and at bedtime.   guaiFENesin (MUCINEX) 600 MG 12 hr tablet Take 1 tablet (600 mg total) by mouth 2 (two) times daily.   Infant Care Products [facility]) OINT Apply 1 application  topically as needed. Apply to peri-area/sacrum   ipratropium-albuterol (DUONEB) 0.5-2.5 (3) MG/3ML SOLN Inhale 3 mLs into the lungs every 6 (six) hours as needed.   melatonin 3 MG TABS tablet Take 3 mg by mouth at bedtime.   metoprolol tartrate  (LOPRESSOR) 25 MG tablet Take 1 tablet (25 mg total) by mouth 2 (two) times daily.   OXYGEN 2lmp every 24 hours as needed for Dyspnea or SOB   polyethylene glycol (MIRALAX / GLYCOLAX) packet Take 17 g by mouth daily as needed.   predniSONE (DELTASONE) 10 MG tablet Take 5 mg by mouth daily.  for maintenance.   traMADol (ULTRAM) 50 MG tablet Take 25 mg by mouth daily as needed. 30 minutes prior to therapy   traZODone (DESYREL) 50 MG tablet Take 25 mg by mouth at bedtime. Give 25mg  by mouth every 12 hours as needed.   umeclidinium bromide (INCRUSE ELLIPTA) 62.5 MCG/ACT AEPB Inhale 1 puff into the lungs daily.   No facility-administered encounter medications on file as of 01/22/2023.    Review of Systems  Immunization History  Administered Date(s) Administered   Influenza Inj Mdck Quad Pf 06/26/2016, 06/02/2019, 06/06/2021   Influenza Split 06/07/2014   Influenza Whole 06/07/2008   Influenza, High Dose Seasonal PF 07/19/2018   Influenza-Unspecified 06/25/2022   PNEUMOCOCCAL CONJUGATE-20 12/12/2021   Pneumococcal Conjugate-13 12/12/2021   Pneumococcal Polysaccharide-23 10/29/2012   Pertinent  Health Maintenance Due  Topic Date Due   INFLUENZA VACCINE  04/10/2023   DEXA SCAN  Discontinued      05/17/2022    9:00 AM 05/17/2022    8:10 PM 05/18/2022   10:00 AM 08/24/2022   11:29 AM 01/09/2023    3:41 PM  Fall Risk  Falls in the past year?     1  Was there an injury with Fall?     0  Fall Risk Category Calculator     2  (RETIRED) Patient Fall Risk Level High fall risk High fall risk High fall risk High fall risk   Patient at Risk for Falls Due to     History of fall(s);Impaired balance/gait;Impaired mobility  Fall risk Follow up     Education provided;Falls evaluation completed   Functional Status Survey:    Vitals:   01/22/23 1115 01/22/23 1117  BP: (!) 176/74 (!) 153/54  Pulse: 68   Resp: 16   Temp: (!) 97.5 F (36.4 C)   SpO2: 93%   Weight: 112 lb 6.4 oz (51 kg)   Height: 5'  4" (1.626 m)    Body mass index is 19.29 kg/m. Physical Exam Constitutional:      Comments: Thin, frail in wheelchair  Cardiovascular:     Rate and Rhythm: Normal rate and regular rhythm.  Pulmonary:     Effort: Pulmonary effort is normal.     Breath sounds: Normal breath sounds.  Abdominal:     Palpations: Abdomen is soft.  Neurological:     Mental Status: She is alert. Mental status is at baseline. She is disoriented.     Labs reviewed: Recent Labs    05/16/22 0638 08/24/22 1139 08/26/22 0000 09/26/22 1240 09/26/22 1526  NA 139 140 140 134*  --   K 4.0 4.5 4.1 3.8  --   CL  103 103 104 99  --   CO2 28 28 29* 26  --   GLUCOSE 99 134*  --  83  --   BUN 31* 17 15 13   --   CREATININE 0.64 0.52 0.6 0.51  --   CALCIUM 8.9 9.1 8.7 9.1  --   MG  --   --   --   --  2.1   Recent Labs    08/24/22 1139 09/26/22 1500  AST 22 18  ALT 12 9  ALKPHOS 45 38  BILITOT 0.9 0.7  PROT 7.1 6.1*  ALBUMIN 3.7 3.3*   Recent Labs    05/18/22 0631 08/24/22 1139 08/26/22 0000 09/26/22 1240 12/09/22 0000 12/12/22 0000  WBC 9.2 8.7 5.6 5.5 7.9 7.9  NEUTROABS  --  7.8* 3,730.00  --  5,822.00 5,822.00  HGB 10.1* 12.6 11.6* 13.1 10.9* 10.9*  HCT 31.6* 41.3 35* 41.4 35* 35*  MCV 87.1 94.3  --  88.1  --   --   PLT 268 217 168 211 233 233   Lab Results  Component Value Date   TSH 2.345 09/26/2022   Lab Results  Component Value Date   HGBA1C 5.2 07/19/2018   Lab Results  Component Value Date   CHOL  04/15/2008    193        ATP III CLASSIFICATION:  <200     mg/dL   Desirable  409-811  mg/dL   Borderline High  >=914    mg/dL   High   HDL 48 78/29/5621   LDLCALC (H) 04/15/2008    123        Total Cholesterol/HDL:CHD Risk Coronary Heart Disease Risk Table                     Men   Women  1/2 Average Risk   3.4   3.3   TRIG 111 04/15/2008   CHOLHDL 4.0 04/15/2008    Significant Diagnostic Results in last 30 days:  No results found.  Assessment/Plan Severe late  onset Alzheimer's dementia without behavioral disturbance, psychotic disturbance, mood disturbance, or anxiety (HCC)  Pulmonary emphysema, unspecified emphysema type (HCC)  Essential hypertension  Chronic atrial fibrillation (HCC)  Dysphagia, unspecified type  Closed fracture of distal end of left femur with routine healing, unspecified fracture morphology, subsequent encounter DOI for fracture 05/14/22. Patient has had significant stabilization since becoming acclimated to nursing facility. Weight has increased, patient is feeding herself and interactive. She does have periods of shortness of breath with underlying COPD, however, she recovers with PRN medications or short course of steroids. This is currently aligned with goals of care. Continue incruse ellipta and pRN duonebs and PRN albuterol. Continue Trazodone 25 mg nightly. Prn tramadol 25 mg daily PRN for pain. Continue metoprolol 25 mg BID. BP slightly above goal at this time, continue to monitor. Continue plavix given hx of CVA. Continue goals of care conversations and supportive care for underlying dementia.    Family/ staff Communication: nursing  Labs/tests ordered:  none

## 2023-03-20 ENCOUNTER — Other Ambulatory Visit: Payer: Self-pay | Admitting: Nurse Practitioner

## 2023-03-20 ENCOUNTER — Non-Acute Institutional Stay: Payer: PPO | Admitting: Nurse Practitioner

## 2023-03-20 ENCOUNTER — Encounter: Payer: Self-pay | Admitting: Nurse Practitioner

## 2023-03-20 DIAGNOSIS — I10 Essential (primary) hypertension: Secondary | ICD-10-CM | POA: Diagnosis not present

## 2023-03-20 DIAGNOSIS — J439 Emphysema, unspecified: Secondary | ICD-10-CM | POA: Diagnosis not present

## 2023-03-20 DIAGNOSIS — G301 Alzheimer's disease with late onset: Secondary | ICD-10-CM | POA: Diagnosis not present

## 2023-03-20 DIAGNOSIS — Z8673 Personal history of transient ischemic attack (TIA), and cerebral infarction without residual deficits: Secondary | ICD-10-CM

## 2023-03-20 DIAGNOSIS — F02C0 Dementia in other diseases classified elsewhere, severe, without behavioral disturbance, psychotic disturbance, mood disturbance, and anxiety: Secondary | ICD-10-CM

## 2023-03-20 DIAGNOSIS — E43 Unspecified severe protein-calorie malnutrition: Secondary | ICD-10-CM

## 2023-03-20 DIAGNOSIS — I482 Chronic atrial fibrillation, unspecified: Secondary | ICD-10-CM

## 2023-03-20 NOTE — Progress Notes (Signed)
Location:  Other [facility].  Nursing Home Room Number: [facility] 112A Place of Service:  SNF ([phone]) Abbey Chatters, NP  PCP: Earnestine Mealing, MD  Patient Care Team: Earnestine Mealing, MD as PCP - General [facility] Medicine)  Extended Emergency Contact Information Primary Emergency Contact: Clinard,Dr. Theadora Rama States of [place] Home Phone: [phone] Mobile Phone: [phone] Relation: Son Secondary Emergency Contact: Thereasa Distance Address: [address]          [place], [place] [address] Darden [place] of [place] Mobile Phone: [phone] Relation: Relative  Goals of care: Advanced Directive information    03/20/2023   11:51 AM  Advanced Directives  Does Patient Have a Medical Advance Directive? Yes  Type of Advance Directive Out of facility DNR (pink MOST or yellow form)  Does patient want to make changes to medical advance directive? No - Patient declined     Chief Complaint  Patient presents with   Medical Management of Chronic Issues    Medical Management of Chronic Issues.     HPI:  Pt is a 89 y.o. female seen today for medical management of chronic disease.  Pt with hx of dementia,severe COPD, HTN, OA, a fib, hx of CVA. Staff reports ongoing memory and function decline.  Continues on predisone for COPD maintenance. Continues with ongoing flares. No complaints of worsening shortness of breath, cough or congestion at this time. Continues on trazodone and melatonin at bedtime for sleep.  Weight stable.   Past Medical History:  Diagnosis Date   Asthma    Closed displaced fracture of distal epiphysis of left femur with routine healing 05/13/2022   COPD (chronic obstructive pulmonary disease) (HCC)    COPD with acute exacerbation (HCC) 03/14/2018   Drop in hemoglobin 05/14/2022   Herpes zoster    Hypertension    Neuralgia    Past Surgical History:  Procedure Laterality Date   LEG SURGERY      Allergies  Allergen Reactions   Aspirin Shortness Of Breath    Nsaids Shortness Of Breath and Anaphylaxis    Throat swelling, shortness of breath   Sulfa Antibiotics Other (See Comments)    Stomach pain Stomach pain    Ceftriaxone Itching    Itching and redness near infusion site.    Codeine Other (See Comments)    Short of breath    Ciprofloxacin Hives   Penicillins     Has patient had a PCN reaction causing immediate rash, facial/tongue/throat swelling, SOB or lightheadedness with hypotension: Unknown Has patient had a PCN reaction causing severe rash involving mucus membranes or skin necrosis: Unknown Has patient had a PCN reaction that required hospitalization: Unknown Has patient had a PCN reaction occurring within the last 10 years: Unknown If all of the above answers are "NO", then may proceed with Cephalosporin use.  Other reaction(s): Angioedema (ALLERGY/intolerance) Hives, swelling   Phenol     Other reaction(s): Angioedema (ALLERGY/intolerance), Other (See Comments) Spasms, nerve symptoms Spasms, nerve symptoms  Other reaction(s): Angioedema (ALLERGY/intolerance) Spasms, nerve symptoms   Promethazine Hcl     Outpatient Encounter Medications as of 03/20/2023  Medication Sig   acetaminophen (TYLENOL) 650 MG CR tablet Take 650 mg by mouth every 8 (eight) hours as needed for pain.   benzocaine (HURRICAINE) 20 % GEL Use as directed 1 Application in the mouth or throat daily as needed.   clopidogrel (PLAVIX) 75 MG tablet Take 75 mg by mouth daily.   cyanocobalamin (,VITAMIN B-12,) 1000 MCG/ML injection Inject 1,000 mcg into the  skin every 30 (thirty) days.    fluticasone-salmeterol (ADVAIR) 500-50 MCG/ACT AEPB Inhale 1 puff into the lungs in the morning and at bedtime.   guaiFENesin (MUCINEX) 600 MG 12 hr tablet Take 1 tablet (600 mg total) by mouth 2 (two) times daily.   Infant Care Products [facility]) OINT Apply 1 application  topically as needed. Apply to peri-area/sacrum   ipratropium-albuterol (DUONEB) 0.5-2.5 (3) MG/3ML  SOLN Inhale 3 mLs into the lungs every 6 (six) hours as needed.   melatonin 3 MG TABS tablet Take 3 mg by mouth at bedtime.   metoprolol tartrate (LOPRESSOR) 25 MG tablet Take 1 tablet (25 mg total) by mouth 2 (two) times daily.   OXYGEN 2lmp every 24 hours as needed for Dyspnea or SOB   polyethylene glycol (MIRALAX / GLYCOLAX) packet Take 17 g by mouth daily as needed.   predniSONE (DELTASONE) 10 MG tablet Take 5 mg by mouth daily.  for maintenance.   traMADol (ULTRAM) 50 MG tablet Take 25 mg by mouth daily as needed. 30 minutes prior to therapy   traZODone (DESYREL) 50 MG tablet Take 25 mg by mouth at bedtime.   umeclidinium bromide (INCRUSE ELLIPTA) 62.5 MCG/ACT AEPB Inhale 1 puff into the lungs daily.   No facility-administered encounter medications on file as of 03/20/2023.    Review of Systems  Unable to perform ROS: Dementia     Immunization History  Administered Date(s) Administered   Influenza Inj Mdck Quad Pf 06/26/2016, 06/02/2019, 06/06/2021   Influenza Split 06/07/2014   Influenza Whole 06/07/2008   Influenza, High Dose Seasonal PF 07/19/2018   Influenza-Unspecified 06/25/2022   PNEUMOCOCCAL CONJUGATE-20 12/12/2021   Pneumococcal Conjugate-13 12/12/2021   Pneumococcal Polysaccharide-23 10/29/2012   Tdap 02/10/2023   Pertinent  Health Maintenance Due  Topic Date Due   INFLUENZA VACCINE  04/10/2023   DEXA SCAN  Discontinued      05/17/2022    9:00 AM 05/17/2022    8:10 PM 05/18/2022   10:00 AM 08/24/2022   11:29 AM 01/09/2023    3:41 PM  Fall Risk  Falls in the past year?     1  Was there an injury with Fall?     0  Fall Risk Category Calculator     2  (RETIRED) Patient Fall Risk Level High fall risk High fall risk High fall risk High fall risk   Patient at Risk for Falls Due to     History of fall(s);Impaired balance/gait;Impaired mobility  Fall risk Follow up     Education provided;Falls evaluation completed   Functional Status Survey:    Vitals:   03/20/23  1145 03/20/23 1151  BP: (!) 146/71 139/66  Pulse: 66   Resp: 16   Temp: (!) 97.5 F (36.4 C)   SpO2: 94%   Weight: 111 lb 9.6 oz (50.6 kg)   Height: 5\' 4"  (1.626 m)    Body mass index is 19.16 kg/m. Physical Exam Constitutional:      General: She is not in acute distress.    Appearance: She is underweight. She is not diaphoretic.     Comments: Frail, elderly female.   HENT:     Head: Normocephalic and atraumatic.     Mouth/Throat:     Pharynx: No oropharyngeal exudate.  Eyes:     Conjunctiva/sclera: Conjunctivae normal.     Pupils: Pupils are equal, round, and reactive to light.  Cardiovascular:     Rate and Rhythm: Normal rate and regular rhythm.  Heart sounds: Normal heart sounds.  Pulmonary:     Effort: Pulmonary effort is normal.     Breath sounds: Rhonchi (throughtout) present.  Abdominal:     General: Bowel sounds are normal.     Palpations: Abdomen is soft.  Musculoskeletal:     Cervical back: Normal range of motion and neck supple.     Right lower leg: No edema.     Left lower leg: No edema.  Skin:    General: Skin is warm and dry.  Neurological:     Mental Status: She is alert.  Psychiatric:        Mood and Affect: Mood normal.     Labs reviewed: Recent Labs    05/16/22 0638 08/24/22 1139 08/26/22 0000 09/26/22 1240 09/26/22 1526  NA 139 140 140 134*  --   K 4.0 4.5 4.1 3.8  --   CL 103 103 104 99  --   CO2 28 28 29* 26  --   GLUCOSE 99 134*  --  83  --   BUN 31* 17 15 13   --   CREATININE 0.64 0.52 0.6 0.51  --   CALCIUM 8.9 9.1 8.7 9.1  --   MG  --   --   --   --  2.1   Recent Labs    08/24/22 1139 09/26/22 1500  AST 22 18  ALT 12 9  ALKPHOS 45 38  BILITOT 0.9 0.7  PROT 7.1 6.1*  ALBUMIN 3.7 3.3*   Recent Labs    05/18/22 0631 08/24/22 1139 08/26/22 0000 09/26/22 1240 12/09/22 0000 12/12/22 0000  WBC 9.2 8.7 5.6 5.5 7.9 7.9  NEUTROABS  --  7.8* 3,730.00  --  5,822.00 5,822.00  HGB 10.1* 12.6 11.6* 13.1 10.9* 10.9*  HCT  31.6* 41.3 35* 41.4 35* 35*  MCV 87.1 94.3  --  88.1  --   --   PLT 268 217 168 211 233 233   Lab Results  Component Value Date   TSH 2.345 09/26/2022   Lab Results  Component Value Date   HGBA1C 5.2 07/19/2018   Lab Results  Component Value Date   CHOL  04/15/2008    193        ATP III CLASSIFICATION:  <200     mg/dL   Desirable  161-096  mg/dL   Borderline High  >=045    mg/dL   High   HDL 48 40/98/1191   LDLCALC (H) 04/15/2008    123        Total Cholesterol/HDL:CHD Risk Coronary Heart Disease Risk Table                     Men   Women  1/2 Average Risk   3.4   3.3   TRIG 111 04/15/2008   CHOLHDL 4.0 04/15/2008    Significant Diagnostic Results in last 30 days:  No results found.  Assessment/Plan 1. Pulmonary emphysema, unspecified emphysema type (HCC) -advanced COPD, stable at this time. Continues on ellipta and advair with prednisone 5 mg daily  2. Essential hypertension -Blood pressure well controlled, goal bp <140/90 Continue current medications and dietary modifications follow metabolic panel  3. Severe late onset Alzheimer's dementia without behavioral disturbance, psychotic disturbance, mood disturbance, or anxiety (HCC) -progressing disease, sleeping more throughout the day. Continues with staff and family support  -previously on hospice but has been discharged, consider another consultation if decline continues.  4. Chronic atrial fibrillation (HCC) Rate controlled  on metoprolol   5. Protein-calorie malnutrition, severe Weight has been stable, continue with nutritional support  6. History of CVA (cerebrovascular accident) No worsening of symptoms, continues on plavix 75 mg daily    Adi Doro K. Biagio Borg [facility] [phone]

## 2023-04-28 ENCOUNTER — Encounter: Payer: Self-pay | Admitting: Student

## 2023-04-28 ENCOUNTER — Non-Acute Institutional Stay (SKILLED_NURSING_FACILITY): Payer: Self-pay | Admitting: Student

## 2023-04-28 DIAGNOSIS — J439 Emphysema, unspecified: Secondary | ICD-10-CM | POA: Diagnosis not present

## 2023-04-28 NOTE — Progress Notes (Unsigned)
Location:  [facility] [place].  Nursing Home Room Number: [facility] [address] Place of Service:  SNF [phone]) Provider:  Earnestine Mealing, MD  Patient Care Team: Earnestine Mealing, MD as PCP - General [facility] Medicine)  Extended Emergency Contact Information Primary Emergency Contact: Wrinkle,Dr. Theadora Rama [place] of [place] Home Phone: [phone] Mobile Phone: [phone] Relation: Son Secondary Emergency Contact: Thereasa Distance Address: [address]          [place], [place] [address] Darden [place] of [place] Mobile Phone: [phone] Relation: Relative  Code Status:  DNR Goals of care: Advanced Directive information    04/28/2023    2:48 PM  Advanced Directives  Does Patient Have a Medical Advance Directive? Yes  Type of Advance Directive Out of facility DNR (pink MOST or yellow form)  Does patient want to make changes to medical advance directive? No - Patient declined     Chief Complaint  Patient presents with   Acute Visit    Shortness of Breath.     HPI:  Pt is a 89 y.o. female seen today for an acute visit for SOB  Patient periodically becomes dyspneic and anxious. Concern that ativan will be too sedating for too long and requesting morphine to help with symptom control. Will order low dose to help with symptoms. She removes all of her clothes during these events.    Past Medical History:  Diagnosis Date   Asthma    Closed displaced fracture of distal epiphysis of left femur with routine healing 05/13/2022   COPD (chronic obstructive pulmonary disease) (HCC)    COPD with acute exacerbation (HCC) 03/14/2018   Drop in hemoglobin 05/14/2022   Herpes zoster    Hypertension    Neuralgia    Past Surgical History:  Procedure Laterality Date   LEG SURGERY      Allergies  Allergen Reactions   Aspirin Shortness Of Breath   Nsaids Shortness Of Breath and Anaphylaxis    Throat swelling, shortness of breath   Sulfa Antibiotics Other (See Comments)    Stomach  pain Stomach pain    Ceftriaxone Itching    Itching and redness near infusion site.    Codeine Other (See Comments)    Short of breath    Ciprofloxacin Hives   Penicillins     Has patient had a PCN reaction causing immediate rash, facial/tongue/throat swelling, SOB or lightheadedness with hypotension: Unknown Has patient had a PCN reaction causing severe rash involving mucus membranes or skin necrosis: Unknown Has patient had a PCN reaction that required hospitalization: Unknown Has patient had a PCN reaction occurring within the last 10 years: Unknown If all of the above answers are "NO", then may proceed with Cephalosporin use.  Other reaction(s): Angioedema (ALLERGY/intolerance) Hives, swelling   Phenol     Other reaction(s): Angioedema (ALLERGY/intolerance), Other (See Comments) Spasms, nerve symptoms Spasms, nerve symptoms  Other reaction(s): Angioedema (ALLERGY/intolerance) Spasms, nerve symptoms   Promethazine Hcl     Outpatient Encounter Medications as of 04/28/2023  Medication Sig   acetaminophen (TYLENOL) 650 MG CR tablet Take 650 mg by mouth every 8 (eight) hours as needed for pain.   benzocaine (HURRICAINE) 20 % GEL Use as directed 1 Application in the mouth or throat daily as needed.   clopidogrel (PLAVIX) 75 MG tablet Take 75 mg by mouth daily.   cyanocobalamin (,VITAMIN B-12,) 1000 MCG/ML injection Inject 1,000 mcg into the skin every 30 (thirty) days.    guaiFENesin (MUCINEX) 600 MG 12 hr tablet Take 1  tablet (600 mg total) by mouth 2 (two) times daily.   Infant Care Products [facility]) OINT Apply 1 application  topically as needed. Apply to peri-area/sacrum   ipratropium-albuterol (DUONEB) 0.5-2.5 (3) MG/3ML SOLN Inhale 3 mLs into the lungs every 6 (six) hours as needed.   melatonin 3 MG TABS tablet Take 3 mg by mouth at bedtime.   metoprolol tartrate (LOPRESSOR) 25 MG tablet Take 1 tablet (25 mg total) by mouth 2 (two) times daily.   morphine 20 MG/5ML  solution Take 0.25 mLs by mouth every 4 (four) hours as needed for pain.   OXYGEN 2lmp every 24 hours as needed for Dyspnea or SOB   polyethylene glycol (MIRALAX / GLYCOLAX) packet Take 17 g by mouth daily as needed.   predniSONE (DELTASONE) 10 MG tablet Take 5 mg by mouth daily.  for maintenance.   traMADol (ULTRAM) 50 MG tablet Take 25 mg by mouth daily as needed. 30 minutes prior to therapy   [DISCONTINUED] fluticasone-salmeterol (ADVAIR) 500-50 MCG/ACT AEPB Inhale 1 puff into the lungs in the morning and at bedtime.   [DISCONTINUED] traZODone (DESYREL) 50 MG tablet Take 25 mg by mouth at bedtime.   [DISCONTINUED] umeclidinium bromide (INCRUSE ELLIPTA) 62.5 MCG/ACT AEPB Inhale 1 puff into the lungs daily.   No facility-administered encounter medications on file as of 04/28/2023.    Review of Systems  Immunization History  Administered Date(s) Administered   Influenza Inj Mdck Quad Pf 06/26/2016, 06/02/2019, 06/06/2021   Influenza Split 06/07/2014   Influenza Whole 06/07/2008   Influenza, High Dose Seasonal PF 07/19/2018   Influenza-Unspecified 06/25/2022   PNEUMOCOCCAL CONJUGATE-20 12/12/2021   Pneumococcal Conjugate-13 12/12/2021   Pneumococcal Polysaccharide-23 10/29/2012   Tdap 02/10/2023   Pertinent  Health Maintenance Due  Topic Date Due   INFLUENZA VACCINE  04/10/2023   DEXA SCAN  Discontinued      05/17/2022    9:00 AM 05/17/2022    8:10 PM 05/18/2022   10:00 AM 08/24/2022   11:29 AM 01/09/2023    3:41 PM  Fall Risk  Falls in the past year?     1  Was there an injury with Fall?     0  Fall Risk Category Calculator     2  (RETIRED) Patient Fall Risk Level High fall risk High fall risk High fall risk High fall risk   Patient at Risk for Falls Due to     History of fall(s);Impaired balance/gait;Impaired mobility  Fall risk Follow up     Education provided;Falls evaluation completed   Functional Status Survey:    Vitals:   04/28/23 1441  BP: (!) 146/67  Pulse: 88   Resp: 20  Temp: (!) 97.5 F (36.4 C)  SpO2: 96%  Weight: 114 lb (51.7 kg)  Height: 5\' 4"  (1.626 m)   Body mass index is 19.57 kg/m. Physical Exam Constitutional:      Comments: Thin frail eyes closed  Cardiovascular:     Rate and Rhythm: Normal rate.  Pulmonary:     Comments: Effort normal no audible wheezes at this time Skin:    General: Skin is warm and dry.     Labs reviewed: Recent Labs    05/16/22 0638 08/24/22 1139 08/26/22 0000 09/26/22 1240 09/26/22 1526  NA 139 140 140 134*  --   K 4.0 4.5 4.1 3.8  --   CL 103 103 104 99  --   CO2 28 28 29* 26  --   GLUCOSE 99 134*  --  83  --   BUN 31* 17 15 13   --   CREATININE 0.64 0.52 0.6 0.51  --   CALCIUM 8.9 9.1 8.7 9.1  --   MG  --   --   --   --  2.1   Recent Labs    08/24/22 1139 09/26/22 1500  AST 22 18  ALT 12 9  ALKPHOS 45 38  BILITOT 0.9 0.7  PROT 7.1 6.1*  ALBUMIN 3.7 3.3*   Recent Labs    05/18/22 0631 08/24/22 1139 08/26/22 0000 09/26/22 1240 12/09/22 0000 12/12/22 0000  WBC 9.2 8.7 5.6 5.5 7.9 7.9  NEUTROABS  --  7.8* 3,730.00  --  5,822.00 5,822.00  HGB 10.1* 12.6 11.6* 13.1 10.9* 10.9*  HCT 31.6* 41.3 35* 41.4 35* 35*  MCV 87.1 94.3  --  88.1  --   --   PLT 268 217 168 211 233 233   Lab Results  Component Value Date   TSH 2.345 09/26/2022   Lab Results  Component Value Date   HGBA1C 5.2 07/19/2018   Lab Results  Component Value Date   CHOL  04/15/2008    193        ATP III CLASSIFICATION:  <200     mg/dL   Desirable  161-096  mg/dL   Borderline High  >=045    mg/dL   High   HDL 48 40/98/1191   LDLCALC (H) 04/15/2008    123        Total Cholesterol/HDL:CHD Risk Coronary Heart Disease Risk Table                     Men   Women  1/2 Average Risk   3.4   3.3   TRIG 111 04/15/2008   CHOLHDL 4.0 04/15/2008    Significant Diagnostic Results in last 30 days:  No results found.  Assessment/Plan Pulmonary emphysema, unspecified emphysema type (HCC) Patient with  dyspnea despite normal oxygen levels no no wheeing. Plan to start mophine PRN for when she is having trouble breathing.   Family/ staff Communication: Nursing to update family.   Labs/tests ordered:  none

## 2023-04-29 ENCOUNTER — Other Ambulatory Visit: Payer: Self-pay | Admitting: Nurse Practitioner

## 2023-04-29 MED ORDER — MORPHINE SULFATE (CONCENTRATE) 20 MG/ML PO SOLN: 5.0000 mg | ORAL | 0 refills | Status: DC | PRN

## 2023-04-29 MED ORDER — MORPHINE SULFATE 20 MG/5ML PO SOLN: ORAL | 0 refills | Status: DC

## 2023-05-07 ENCOUNTER — Non-Acute Institutional Stay (SKILLED_NURSING_FACILITY): Admitting: Student

## 2023-05-07 ENCOUNTER — Encounter: Payer: Self-pay | Admitting: Student

## 2023-05-07 DIAGNOSIS — J441 Chronic obstructive pulmonary disease with (acute) exacerbation: Secondary | ICD-10-CM | POA: Diagnosis not present

## 2023-05-07 DIAGNOSIS — E43 Unspecified severe protein-calorie malnutrition: Secondary | ICD-10-CM

## 2023-05-07 DIAGNOSIS — I482 Chronic atrial fibrillation, unspecified: Secondary | ICD-10-CM | POA: Diagnosis not present

## 2023-05-07 DIAGNOSIS — F0390 Unspecified dementia without behavioral disturbance: Secondary | ICD-10-CM

## 2023-05-07 NOTE — Progress Notes (Unsigned)
Location:  Other [facility].  Nursing Home Room Number: [place]. [address] Place of Service:  SNF (31) Provider:  Earnestine Mealing, MD  Patient Care Team: Earnestine Mealing, MD as PCP - General (Family Medicine)  Extended Emergency Contact Information Primary Emergency Contact: Daluz,Dr. Theadora Rama [place] of [place] Home Phone: [phone] Mobile Phone: [phone] Relation: Son Secondary Emergency Contact: Thereasa Distance Address: [address]          [place], [place] [address] Darden [place] of [place] Mobile Phone: [phone] Relation: Relative  Code Status:  DNR Goals of care: Advanced Directive information    05/07/2023   11:18 AM  Advanced Directives  Does Patient Have a Medical Advance Directive? Yes  Type of Advance Directive Out of facility DNR (pink MOST or yellow form)  Does patient want to make changes to medical advance directive? No - Patient declined     Chief Complaint  Patient presents with   Acute Visit    Wheezing.     HPI:  Pt is a 89 y.o. female seen today for an acute visit for Wheezing. Patient with hx of COPD now with worsened wheezing. Per nursing report, patient has had O2 drop to 70s and is requiring oxygen supplementation. She hasn't eaten much for the last 2 days.   Discussed concern for patient's decline with her son/HCPOA. He asks that we give 24 hours of improvement, and if no improvement, okay with transition to hospice care.    Past Medical History:  Diagnosis Date   Asthma    Closed displaced fracture of distal epiphysis of left femur with routine healing 05/13/2022   COPD (chronic obstructive pulmonary disease) (HCC)    COPD with acute exacerbation (HCC) 03/14/2018   Drop in hemoglobin 05/14/2022   Herpes zoster    Hypertension    Neuralgia    Past Surgical History:  Procedure Laterality Date   LEG SURGERY      Allergies  Allergen Reactions   Aspirin Shortness Of Breath   Nsaids Shortness Of Breath and Anaphylaxis     Throat swelling, shortness of breath   Sulfa Antibiotics Other (See Comments)    Stomach pain Stomach pain    Ceftriaxone Itching    Itching and redness near infusion site.    Codeine Other (See Comments)    Short of breath    Ciprofloxacin Hives   Penicillins     Has patient had a PCN reaction causing immediate rash, facial/tongue/throat swelling, SOB or lightheadedness with hypotension: Unknown Has patient had a PCN reaction causing severe rash involving mucus membranes or skin necrosis: Unknown Has patient had a PCN reaction that required hospitalization: Unknown Has patient had a PCN reaction occurring within the last 10 years: Unknown If all of the above answers are "NO", then may proceed with Cephalosporin use.  Other reaction(s): Angioedema (ALLERGY/intolerance) Hives, swelling   Phenol     Other reaction(s): Angioedema (ALLERGY/intolerance), Other (See Comments) Spasms, nerve symptoms Spasms, nerve symptoms  Other reaction(s): Angioedema (ALLERGY/intolerance) Spasms, nerve symptoms   Promethazine Hcl     Outpatient Encounter Medications as of 05/07/2023  Medication Sig   acetaminophen (TYLENOL) 650 MG CR tablet Take 650 mg by mouth every 8 (eight) hours as needed for pain.   benzocaine (HURRICAINE) 20 % GEL Use as directed 1 Application in the mouth or throat daily as needed.   clopidogrel (PLAVIX) 75 MG tablet Take 75 mg by mouth daily.   cyanocobalamin (,VITAMIN B-12,) 1000 MCG/ML injection Inject 1,000 mcg into the  skin every 30 (thirty) days.    guaiFENesin (MUCINEX) 600 MG 12 hr tablet Take 1 tablet (600 mg total) by mouth 2 (two) times daily.   Infant Care Products [facility]) OINT Apply 1 application  topically as needed. Apply to peri-area/sacrum   ipratropium-albuterol (DUONEB) 0.5-2.5 (3) MG/3ML SOLN Inhale 3 mLs into the lungs 3 (three) times daily.   melatonin 3 MG TABS tablet Take 3 mg by mouth at bedtime.   metoprolol tartrate (LOPRESSOR) 25 MG tablet  Take 1 tablet (25 mg total) by mouth 2 (two) times daily.   morphine (ROXANOL) 20 MG/ML concentrated solution Take 0.25 mLs (5 mg total) by mouth every 4 (four) hours as needed for shortness of breath. (Patient taking differently: Give .13ml by mouth every 24 hours as needed for air hunger/respiratory distress.)   OXYGEN 2lmp every 24 hours as needed for Dyspnea or SOB   polyethylene glycol (MIRALAX / GLYCOLAX) packet Take 17 g by mouth daily as needed.   predniSONE (DELTASONE) 10 MG tablet Take 5 mg by mouth daily.  for maintenance.   traMADol (ULTRAM) 50 MG tablet Take 25 mg by mouth daily as needed. 30 minutes prior to therapy   traZODone (DESYREL) 50 MG tablet Take 25 mg by mouth at bedtime.   No facility-administered encounter medications on file as of 05/07/2023.    Review of Systems  Immunization History  Administered Date(s) Administered   Influenza Inj Mdck Quad Pf 06/26/2016, 06/02/2019, 06/06/2021   Influenza Split 06/07/2014   Influenza Whole 06/07/2008   Influenza, High Dose Seasonal PF 07/19/2018   Influenza-Unspecified 06/25/2022   PNEUMOCOCCAL CONJUGATE-20 12/12/2021   Pneumococcal Conjugate-13 12/12/2021   Pneumococcal Polysaccharide-23 10/29/2012   Tdap 02/10/2023   Pertinent  Health Maintenance Due  Topic Date Due   INFLUENZA VACCINE  04/10/2023   DEXA SCAN  Discontinued      05/17/2022    9:00 AM 05/17/2022    8:10 PM 05/18/2022   10:00 AM 08/24/2022   11:29 AM 01/09/2023    3:41 PM  Fall Risk  Falls in the past year?     1  Was there an injury with Fall?     0  Fall Risk Category Calculator     2  (RETIRED) Patient Fall Risk Level High fall risk High fall risk High fall risk High fall risk   Patient at Risk for Falls Due to     History of fall(s);Impaired balance/gait;Impaired mobility  Fall risk Follow up     Education provided;Falls evaluation completed   Functional Status Survey:    Vitals:   05/07/23 1059 05/07/23 1121  BP: (!) 174/90 129/69  Pulse:  (!) 108   Resp: 20   Temp: (!) 97.4 F (36.3 C)   SpO2: 95%   Weight: 111 lb (50.3 kg)   Height: 5\' 4"  (1.626 m)    Body mass index is 19.05 kg/m. Physical Exam Constitutional:      Comments: Sniffing position  Cardiovascular:     Pulses: Normal pulses.     Heart sounds: Normal heart sounds.  Pulmonary:     Comments: Wheezing inspiratory and expiratory.  Abdominal:     General: Abdomen is flat.     Palpations: Abdomen is soft.  Neurological:     Mental Status: She is alert and oriented to person, place, and time.     Labs reviewed: Recent Labs    05/16/22 0638 08/24/22 1139 08/26/22 0000 09/26/22 1240 09/26/22 1526  NA 139 140 140  134*  --   K 4.0 4.5 4.1 3.8  --   CL 103 103 104 99  --   CO2 28 28 29* 26  --   GLUCOSE 99 134*  --  83  --   BUN 31* 17 15 13   --   CREATININE 0.64 0.52 0.6 0.51  --   CALCIUM 8.9 9.1 8.7 9.1  --   MG  --   --   --   --  2.1   Recent Labs    08/24/22 1139 09/26/22 1500  AST 22 18  ALT 12 9  ALKPHOS 45 38  BILITOT 0.9 0.7  PROT 7.1 6.1*  ALBUMIN 3.7 3.3*   Recent Labs    05/18/22 0631 08/24/22 1139 08/26/22 0000 09/26/22 1240 12/09/22 0000 12/12/22 0000  WBC 9.2 8.7 5.6 5.5 7.9 7.9  NEUTROABS  --  7.8* 3,730.00  --  5,822.00 5,822.00  HGB 10.1* 12.6 11.6* 13.1 10.9* 10.9*  HCT 31.6* 41.3 35* 41.4 35* 35*  MCV 87.1 94.3  --  88.1  --   --   PLT 268 217 168 211 233 233   Lab Results  Component Value Date   TSH 2.345 09/26/2022   Lab Results  Component Value Date   HGBA1C 5.2 07/19/2018   Lab Results  Component Value Date   CHOL  04/15/2008    193        ATP III CLASSIFICATION:  <200     mg/dL   Desirable  604-540  mg/dL   Borderline High  >=981    mg/dL   High   HDL 48 19/14/7829   LDLCALC (H) 04/15/2008    123        Total Cholesterol/HDL:CHD Risk Coronary Heart Disease Risk Table                     Men   Women  1/2 Average Risk   3.4   3.3   TRIG 111 04/15/2008   CHOLHDL 4.0 04/15/2008     Significant Diagnostic Results in last 30 days:  No results found.  Assessment/Plan COPD exacerbation (HCC)  Dementia without behavioral disturbance (HCC)  Protein-calorie malnutrition, severe  Chronic atrial fibrillation (HCC) Hx of COPD with acute exacerbation despite schedule duonebs and chronic steroid supplementation. Discussed concern that patient is nearing end of life with concomitant poor PO intake. Will monitor PO intake for 24 hours and if no improvement, transition to comfort care.   Family/ staff Communication: Nursing, HCPOA  Labs/tests ordered:  none

## 2023-06-10 DEATH — deceased
